# Patient Record
Sex: Female | Born: 1985 | Race: Black or African American | Hispanic: Refuse to answer | Smoking: Former smoker
Health system: Southern US, Community
[De-identification: ages and names within clinical notes are randomized; demographics above are authoritative.]

## PROBLEM LIST (undated history)

## (undated) DIAGNOSIS — G43909 Migraine, unspecified, not intractable, without status migrainosus: Secondary | ICD-10-CM

## (undated) DIAGNOSIS — F17210 Nicotine dependence, cigarettes, uncomplicated: Secondary | ICD-10-CM

## (undated) DIAGNOSIS — I499 Cardiac arrhythmia, unspecified: Secondary | ICD-10-CM

## (undated) DIAGNOSIS — E785 Hyperlipidemia, unspecified: Secondary | ICD-10-CM

## (undated) DIAGNOSIS — Z975 Presence of (intrauterine) contraceptive device: Secondary | ICD-10-CM

## (undated) DIAGNOSIS — F339 Major depressive disorder, recurrent, unspecified: Secondary | ICD-10-CM

## (undated) DIAGNOSIS — J189 Pneumonia, unspecified organism: Secondary | ICD-10-CM

## (undated) HISTORY — DX: Nicotine dependence, cigarettes, uncomplicated: F17.210

## (undated) HISTORY — DX: Migraine, unspecified, not intractable, without status migrainosus: G43.909

## (undated) HISTORY — PX: OTHER SURGICAL HISTORY: SHX169

## (undated) HISTORY — DX: Presence of (intrauterine) contraceptive device: Z97.5

## (undated) HISTORY — PX: WISDOM TOOTH EXTRACTION: SHX21

## (undated) HISTORY — DX: Major depressive disorder, recurrent, unspecified: F33.9

---
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
## 2004-06-11 ENCOUNTER — Emergency Department: Payer: Self-pay | Admitting: Emergency Medicine

## 2005-07-13 ENCOUNTER — Other Ambulatory Visit: Payer: Self-pay

## 2005-07-13 ENCOUNTER — Emergency Department: Payer: Self-pay | Admitting: Unknown Physician Specialty

## 2010-11-03 ENCOUNTER — Inpatient Hospital Stay: Payer: Self-pay | Admitting: Internal Medicine

## 2012-08-16 ENCOUNTER — Other Ambulatory Visit: Payer: Self-pay | Admitting: Specialist

## 2012-08-17 ENCOUNTER — Ambulatory Visit
Admission: RE | Admit: 2012-08-17 | Discharge: 2012-08-17 | Disposition: A | Discharge status: Home / Self Care | Source: Ambulatory Visit | Attending: Specialist | Admitting: Specialist

## 2014-12-21 ENCOUNTER — Ambulatory Visit (INDEPENDENT_AMBULATORY_CARE_PROVIDER_SITE_OTHER): Admitting: Family Medicine

## 2014-12-21 ENCOUNTER — Encounter: Payer: Self-pay | Admitting: Family Medicine

## 2014-12-21 VITALS — BP 116/78 | HR 118 | Temp 99.5°F | Resp 16 | Ht 63.0 in | Wt 211.1 lb

## 2014-12-21 DIAGNOSIS — Z30431 Encounter for routine checking of intrauterine contraceptive device: Secondary | ICD-10-CM | POA: Diagnosis not present

## 2014-12-21 DIAGNOSIS — R5383 Other fatigue: Secondary | ICD-10-CM | POA: Diagnosis not present

## 2014-12-21 DIAGNOSIS — G43009 Migraine without aura, not intractable, without status migrainosus: Secondary | ICD-10-CM

## 2014-12-21 DIAGNOSIS — R600 Localized edema: Secondary | ICD-10-CM | POA: Insufficient documentation

## 2014-12-21 DIAGNOSIS — IMO0001 Reserved for inherently not codable concepts without codable children: Secondary | ICD-10-CM | POA: Insufficient documentation

## 2014-12-21 DIAGNOSIS — E66812 Obesity, class 2: Secondary | ICD-10-CM | POA: Insufficient documentation

## 2014-12-21 DIAGNOSIS — Z720 Tobacco use: Secondary | ICD-10-CM

## 2014-12-21 DIAGNOSIS — Z13220 Encounter for screening for lipoid disorders: Secondary | ICD-10-CM | POA: Diagnosis not present

## 2014-12-21 DIAGNOSIS — G43109 Migraine with aura, not intractable, without status migrainosus: Secondary | ICD-10-CM | POA: Insufficient documentation

## 2014-12-21 DIAGNOSIS — E669 Obesity, unspecified: Secondary | ICD-10-CM

## 2014-12-21 DIAGNOSIS — Z136 Encounter for screening for cardiovascular disorders: Secondary | ICD-10-CM | POA: Diagnosis not present

## 2014-12-21 DIAGNOSIS — F17210 Nicotine dependence, cigarettes, uncomplicated: Secondary | ICD-10-CM | POA: Insufficient documentation

## 2014-12-21 MED ORDER — BUPROPION HCL ER (SR) 150 MG PO TB12: 150.0000 mg | ORAL_TABLET | Freq: Two times a day (BID) | ORAL | Status: DC

## 2014-12-21 MED ORDER — FUROSEMIDE 20 MG PO TABS: 10.0000 mg | ORAL_TABLET | Freq: Every day | ORAL | Status: DC | PRN

## 2014-12-21 NOTE — Patient Instructions (Signed)
Peripheral Edema °You have swelling in your legs (peripheral edema). This swelling is due to excess accumulation of salt and water in your body. Edema may be a sign of heart, kidney or liver disease, or a side effect of a medication. It may also be due to problems in the leg veins. Elevating your legs and using special support stockings may be very helpful, if the cause of the swelling is due to poor venous circulation. Avoid long periods of standing, whatever the cause. °Treatment of edema depends on identifying the cause. Chips, pretzels, pickles and other salty foods should be avoided. Restricting salt in your diet is almost always needed. Water pills (diuretics) are often used to remove the excess salt and water from your body via urine. These medicines prevent the kidney from reabsorbing sodium. This increases urine flow. °Diuretic treatment may also result in lowering of potassium levels in your body. Potassium supplements may be needed if you have to use diuretics daily. Daily weights can help you keep track of your progress in clearing your edema. You should call your caregiver for follow up care as recommended. °SEEK IMMEDIATE MEDICAL CARE IF:  °· You have increased swelling, pain, redness, or heat in your legs. °· You develop shortness of breath, especially when lying down. °· You develop chest or abdominal pain, weakness, or fainting. °· You have a fever. °Document Released: 05/14/2004 Document Revised: 06/29/2011 Document Reviewed: 04/24/2009 °[employer]® Patient Information ©2015 [employer]. This information is not intended to replace advice given to you by your health care provider. Make sure you discuss any questions you have with your health care provider. ° °

## 2014-12-21 NOTE — Progress Notes (Signed)
Name: Olivia King   MRN: [id number]    DOB: [birth year]   Date:12/21/2014       Progress Note  Subjective  Chief Complaint  Chief Complaint  Patient presents with  . Establish Care    pt here to est care    HPI  Olivia King is a 29 year old female with a 30 yo female child and 30 yo female child who is here to establish care for Primary care services. Last PAP test done 11/2013 though Gynecology specialist, normal. She does have Mirena IUD (2nd one placed) which she reports is displaced, strings not identified, ultrasound confirmed reasonable placement. Due to have Mirena out next year. She reports some pressure sensation when on her very light menses without pain or blood clots.  In addition she is interested in smoking cessation. She is currently smoking 1/2 pack to 1 pack a day of cigarettes for many years. Has tried nicoderm gum, losengens, patches with minimal relief. Has not yet tried Wellbutrin. She reports some fatigue and difficulty losing weight due to exercise intolerance. When she was pregnant she was up to 280 lbs. She does not exercise regularly or have a balance diet of low fat, low carbohydrate meals. In addition patient complains of edema which is a ongoing chronic issues for over 1 year now. The location of the edema is lower leg(s) bilateral. The edema has been off and on. Onset of symptoms was several years ago, unchanged since that time. The edema is present in the evening mostly and not every day. The patient states the problem is long-standing. The swelling has been aggravated by dependency of involved area, relieved by elevation of involved area, low-salt diet. Cardiac risk factors include obesity (BMI >= 30 kg/m2). Not associated with chest pain or SOB. Does note continued frustration with not being able to lose weight, fatigue.  Has had migraine headaches in the past. In the past was on topiramate but recently has not needed anything but occasional Excedrine  migraine.   Active Ambulatory Problems    Diagnosis Date Noted  . Cigarette smoker 12/21/2014  . Obesity, Class II, BMI 35-39.9 12/21/2014  . Migraine without aura and without status migrainosus, not intractable 12/21/2014  . Bilateral lower extremity edema 12/21/2014  . Encounter for cholesteral screening for cardiovascular disease 12/21/2014  . Other fatigue 12/21/2014  . Surveillance of (intrauterine) contraceptive device 12/21/2014   Resolved Ambulatory Problems    Diagnosis Date Noted  . No Resolved Ambulatory Problems   No Additional Past Medical History    Social History  Substance Use Topics  . Smoking status: Current Every Day Smoker  . Smokeless tobacco: Not on file  . Alcohol Use: No    Current outpatient prescriptions:  Marland Kitchen  Multiple Vitamin (MULTI-VITAMINS) TABS, Take by mouth., Disp: , Rfl:   Past Surgical History  Procedure Laterality Date  . Contraceptive implant      Family History  Problem Relation Age of Onset  . Hypertension Mother   . Hypertension Father     Allergies  Allergen Reactions  . Penicillins      Review of Systems  CONSTITUTIONAL: No significant weight changes, fever, chills, weakness. Yes fatigue.  HEENT:  - Eyes: No visual changes.  - Ears: No auditory changes. No pain.  - Nose: No sneezing, congestion, runny nose. - Throat: No sore throat. No changes in swallowing. SKIN: No rash or itching.  CARDIOVASCULAR: No chest pain, chest pressure or chest discomfort. No palpitations. Yes edema.  RESPIRATORY: No shortness of breath, cough or sputum.  GASTROINTESTINAL: No anorexia, nausea, vomiting. No changes in bowel habits. No abdominal pain or blood.  GENITOURINARY: No dysuria. No frequency. No discharge.  NEUROLOGICAL: No headache, dizziness, syncope, paralysis, ataxia, numbness or tingling in the extremities. No memory changes. No change in bowel or bladder control.  MUSCULOSKELETAL: No joint pain. No muscle pain. HEMATOLOGIC:  No anemia, bleeding or bruising.  LYMPHATICS: No enlarged lymph nodes.  PSYCHIATRIC: No change in mood. No change in sleep pattern.  ENDOCRINOLOGIC: No reports of sweating, cold or heat intolerance. No polyuria or polydipsia.     Objective  BP 116/78 mmHg  Pulse 118  Temp(Src) 99.5 F (37.5 C)  Resp 16  Ht 5\' 3"  (1.6 m)  Wt 211 lb 2 oz (95.766 kg)  BMI 37.41 kg/m2  SpO2 94% Body mass index is 37.41 kg/(m^2).  Physical Exam  Constitutional: Patient is obese and well-nourished. In no distress.  HEENT:  - Head: Normocephalic and atraumatic.  - Ears: Bilateral TMs gray, no erythema or effusion - Nose: Nasal mucosa moist - Mouth/Throat: Oropharynx is clear and moist. No tonsillar hypertrophy or erythema. No post nasal drainage.  - Eyes: Conjunctivae clear, EOM movements normal. PERRLA. No scleral icterus.  Neck: Normal range of motion. Neck supple. No JVD present. No thyromegaly present.  Cardiovascular: Normal rate, regular rhythm and normal heart sounds.  No murmur heard.  Pulmonary/Chest: Effort normal and breath sounds normal. No respiratory distress. Musculoskeletal: Normal range of motion bilateral UE and LE, no joint effusions. Peripheral vascular: Bilateral LE no edema. Neurological: CN II-XII grossly intact with no focal deficits. Alert and oriented to person, place, and time. Coordination, balance, strength, speech and gait are normal.  Skin: Skin is warm and dry. No rash noted. No erythema.  Psychiatric: Patient has a normal mood and affect. Behavior is normal in office today. Judgment and thought content normal in office today.  Assessment & Plan  1. Cigarette smoker The patient has been counseled on smoking cessation benefits, goals, strategies and available over the counter and prescription medications that may help them in their efforts.  Options discussed include Nicoderm patches, Wellbutrin and Chantix.  The patient voices understanding their increased risk of  cardiovascular and pulmonary diseases with continued use of tobacco products.  - buPROPion (WELLBUTRIN SR) 150 MG 12 hr tablet; Take 1 tablet (150 mg total) by mouth 2 (two) times daily.  Dispense: 60 tablet; Refill: 2  2. Obesity, Class II, BMI 35-39.9 The patient has been counseled on their higher than normal BMI.  They have verbally expressed understanding their increased risk for other diseases.  In efforts to meet a better target BMI goal the patient has been counseled on lifestyle, diet and exercise modification tactics. Start with moderate intensity aerobic exercise (walking, jogging, elliptical, swimming, group or individual sports, hiking) at least 14mins a day at least 4 days a week and increase intensity, duration, frequency as tolerated. Diet should include well balance fresh fruits and vegetables avoiding processed foods, carbohydrates and sugars. Drink at least 8oz 10 glasses a day avoiding sodas, sugary fruit drinks, sweetened tea. Check weight on a reliable scale daily and monitor weight loss progress daily. Consider investing in mobile phone apps that will help keep track of weight loss goals.  - CBC with Differential/Platelet - Comprehensive metabolic panel - Lipid panel - Hemoglobin A1c - TSH - Vit D  25 hydroxy (rtn osteoporosis monitoring)  3. Migraine without aura and without status migrainosus,  not intractable In the past was on topiramate but recently has not needed anything but occasional Excedrine migraine.  - CBC with Differential/Platelet - Comprehensive metabolic panel - TSH - Vit D  25 hydroxy (rtn osteoporosis monitoring)  4. Bilateral lower extremity edema Likely due to venous valve insufficiency. Weight loss recommended.  May use low dose lasix prn. The patient has been counseled on the proper use, side effects and potential interactions of the new medication. Patient encouraged to review the side effects and safety profile pamphlet provided with the  prescription from the pharmacy as well as request counseling from the pharmacy team as needed.    - CBC with Differential/Platelet - Comprehensive metabolic panel - TSH - Vit D  25 hydroxy (rtn osteoporosis monitoring) - furosemide (LASIX) 20 MG tablet; Take 0.5-1 tablets (10-20 mg total) by mouth daily as needed for fluid or edema.  Dispense: 30 tablet; Refill: 3  5. Encounter for cholesteral screening for cardiovascular disease Lab work.  - Lipid panel  6. Other fatigue Multifactorial. Will start with blood work.  Wellbutrin may help with energy if related to mood disorder.  - CBC with Differential/Platelet - Comprehensive metabolic panel - Lipid panel - Hemoglobin A1c - TSH - Vit D  25 hydroxy (rtn osteoporosis monitoring) - buPROPion (WELLBUTRIN SR) 150 MG 12 hr tablet; Take 1 tablet (150 mg total) by mouth 2 (two) times daily.  Dispense: 60 tablet; Refill: 2  7. Surveillance of (intrauterine) contraceptive device Will check pelvic exam at CPE but her pressure sensation is limited to menses time and likely nothing significant as the discomfort is not prolonged.

## 2014-12-22 LAB — CBC WITH DIFFERENTIAL/PLATELET
BASOS: 1 %
Basophils Absolute: 0.1 10*3/uL (ref 0.0–0.2)
EOS (ABSOLUTE): 0.2 10*3/uL (ref 0.0–0.4)
EOS: 2 %
HEMATOCRIT: 37.4 % (ref 34.0–46.6)
Hemoglobin: 12.6 g/dL (ref 11.1–15.9)
Immature Grans (Abs): 0 10*3/uL (ref 0.0–0.1)
Immature Granulocytes: 0 %
LYMPHS ABS: 2.6 10*3/uL (ref 0.7–3.1)
Lymphs: 28 %
MCH: 29.6 pg (ref 26.6–33.0)
MCHC: 33.7 g/dL (ref 31.5–35.7)
MCV: 88 fL (ref 79–97)
MONOS ABS: 0.5 10*3/uL (ref 0.1–0.9)
Monocytes: 6 %
Neutrophils Absolute: 5.8 10*3/uL (ref 1.4–7.0)
Neutrophils: 63 %
PLATELETS: 349 10*3/uL (ref 150–379)
RBC: 4.25 x10E6/uL (ref 3.77–5.28)
RDW: 13 % (ref 12.3–15.4)
WBC: 9.2 10*3/uL (ref 3.4–10.8)

## 2014-12-22 LAB — COMPREHENSIVE METABOLIC PANEL
A/G RATIO: 1.2 (ref 1.1–2.5)
ALK PHOS: 72 IU/L (ref 39–117)
ALT: 12 IU/L (ref 0–32)
AST: 19 IU/L (ref 0–40)
Albumin: 4.1 g/dL (ref 3.5–5.5)
BILIRUBIN TOTAL: 0.7 mg/dL (ref 0.0–1.2)
BUN/Creatinine Ratio: 7 — ABNORMAL LOW (ref 8–20)
BUN: 7 mg/dL (ref 6–20)
CHLORIDE: 101 mmol/L (ref 97–108)
CO2: 23 mmol/L (ref 18–29)
Calcium: 9.9 mg/dL (ref 8.7–10.2)
Creatinine, Ser: 1.06 mg/dL — ABNORMAL HIGH (ref 0.57–1.00)
GFR calc Af Amer: 82 mL/min/{1.73_m2} (ref >59)
GFR calc non Af Amer: 71 mL/min/{1.73_m2} (ref >59)
GLOBULIN, TOTAL: 3.3 g/dL (ref 1.5–4.5)
Glucose: 79 mg/dL (ref 65–99)
POTASSIUM: 5.3 mmol/L — AB (ref 3.5–5.2)
SODIUM: 137 mmol/L (ref 134–144)
Total Protein: 7.4 g/dL (ref 6.0–8.5)

## 2014-12-22 LAB — HEMOGLOBIN A1C
Est. average glucose Bld gHb Est-mCnc: 103 mg/dL
Hgb A1c MFr Bld: 5.2 % (ref 4.8–5.6)

## 2014-12-22 LAB — LIPID PANEL
CHOL/HDL RATIO: 3.7 ratio (ref 0.0–4.4)
CHOLESTEROL TOTAL: 176 mg/dL (ref 100–199)
HDL: 47 mg/dL (ref >39)
LDL Calculated: 113 mg/dL — ABNORMAL HIGH (ref 0–99)
TRIGLYCERIDES: 80 mg/dL (ref 0–149)
VLDL Cholesterol Cal: 16 mg/dL (ref 5–40)

## 2014-12-22 LAB — VITAMIN D 25 HYDROXY (VIT D DEFICIENCY, FRACTURES): Vit D, 25-Hydroxy: 29.9 ng/mL — ABNORMAL LOW (ref 30.0–100.0)

## 2014-12-22 LAB — TSH: TSH: 1.68 u[IU]/mL (ref 0.450–4.500)

## 2014-12-24 ENCOUNTER — Other Ambulatory Visit: Payer: Self-pay | Admitting: Family Medicine

## 2014-12-24 DIAGNOSIS — R7989 Other specified abnormal findings of blood chemistry: Secondary | ICD-10-CM | POA: Insufficient documentation

## 2014-12-24 MED ORDER — VITAMIN D (ERGOCALCIFEROL) 1.25 MG (50000 UNIT) PO CAPS: 50000.0000 [IU] | ORAL_CAPSULE | ORAL | Status: DC

## 2014-12-25 ENCOUNTER — Telehealth: Payer: Self-pay | Admitting: Family Medicine

## 2014-12-25 NOTE — Telephone Encounter (Signed)
Patient returning your call.

## 2014-12-25 NOTE — Telephone Encounter (Signed)
Contacted this patient to review the results from her most recent labs. After she verified her date of birth, labs were reviewed. Patient said thanks for letting her know and that she will pick up the lab slip.

## 2015-03-21 ENCOUNTER — Other Ambulatory Visit: Payer: Self-pay | Admitting: Family Medicine

## 2015-05-13 ENCOUNTER — Ambulatory Visit (INDEPENDENT_AMBULATORY_CARE_PROVIDER_SITE_OTHER): Admitting: Family Medicine

## 2015-05-13 ENCOUNTER — Encounter: Payer: Self-pay | Admitting: Family Medicine

## 2015-05-13 ENCOUNTER — Telehealth: Payer: Self-pay | Admitting: Family Medicine

## 2015-05-13 VITALS — BP 118/82 | HR 103 | Temp 99.7°F | Resp 16 | Wt 218.0 lb

## 2015-05-13 DIAGNOSIS — Z720 Tobacco use: Secondary | ICD-10-CM | POA: Diagnosis not present

## 2015-05-13 DIAGNOSIS — N390 Urinary tract infection, site not specified: Secondary | ICD-10-CM

## 2015-05-13 DIAGNOSIS — R059 Cough, unspecified: Secondary | ICD-10-CM

## 2015-05-13 DIAGNOSIS — Z716 Tobacco abuse counseling: Secondary | ICD-10-CM | POA: Diagnosis not present

## 2015-05-13 DIAGNOSIS — R05 Cough: Secondary | ICD-10-CM

## 2015-05-13 DIAGNOSIS — F17210 Nicotine dependence, cigarettes, uncomplicated: Secondary | ICD-10-CM

## 2015-05-13 DIAGNOSIS — G43009 Migraine without aura, not intractable, without status migrainosus: Secondary | ICD-10-CM

## 2015-05-13 DIAGNOSIS — R1030 Lower abdominal pain, unspecified: Secondary | ICD-10-CM | POA: Insufficient documentation

## 2015-05-13 DIAGNOSIS — R600 Localized edema: Secondary | ICD-10-CM

## 2015-05-13 LAB — POCT URINALYSIS DIPSTICK
BILIRUBIN UA: NEGATIVE
Glucose, UA: NEGATIVE
Ketones, UA: NEGATIVE
Leukocytes, UA: NEGATIVE
NITRITE UA: NEGATIVE
PH UA: 5.5
Protein, UA: NEGATIVE
Spec Grav, UA: 1.025
Urobilinogen, UA: 0.2

## 2015-05-13 MED ORDER — VARENICLINE TARTRATE 1 MG PO TABS: 1.0000 mg | ORAL_TABLET | Freq: Two times a day (BID) | ORAL | Status: DC

## 2015-05-13 MED ORDER — CEPHALEXIN 500 MG PO CAPS: 500.0000 mg | ORAL_CAPSULE | Freq: Two times a day (BID) | ORAL | Status: DC

## 2015-05-13 MED ORDER — FUROSEMIDE 20 MG PO TABS: 10.0000 mg | ORAL_TABLET | Freq: Every day | ORAL | Status: DC | PRN

## 2015-05-13 MED ORDER — VARENICLINE TARTRATE 0.5 MG PO TABS: ORAL_TABLET | ORAL | Status: DC

## 2015-05-13 NOTE — Telephone Encounter (Signed)
Ok changed and faxed to [employer].  Patient notified.

## 2015-05-13 NOTE — Progress Notes (Signed)
Name: Olivia King   MRN: [id number]    DOB: Aug 12, 1985   Date:05/13/2015       Progress Note  Subjective  Chief Complaint  Chief Complaint  Patient presents with  . Urinary Tract Infection    lower abdominal pain with burning and urine has strong odor.  . Nicotine Dependence    patient states welbutrin was not helping her quit smoking wants to see about trying something.    HPI  Olivia King is a 30 year old female who is interested in quitting smoking cigarettes. She is currently an every day smoker up to 1ppd. Previously she was prescribed Wellbutrin SR 150 mg po bid for 2 months but it did not help her with her smoking cessation goals. Would like to try alternative if there is other options. Associated symptoms include recent coughing for 2 weeks with sputum production but no fevers, chills, sob.   Patient is here today with concerns regarding the following symptoms abnormal smelling urine, burning with urination, dysuria, frequency and suprapubic pressure that started 5 days ago.  Associated with fatigue. Not associated with flank pain, fevers, pelvic pain, vaginal discharge.    Past Medical History  Diagnosis Date  . Migraine headache   . Cigarette smoker     Social History  Substance Use Topics  . Smoking status: Current Every Day Smoker  . Smokeless tobacco: Not on file  . Alcohol Use: No     Current outpatient prescriptions:  .  buPROPion (WELLBUTRIN SR) 150 MG 12 hr tablet, Take 1 tablet (150 mg total) by mouth 2 (two) times daily., Disp: 60 tablet, Rfl: 2 .  furosemide (LASIX) 20 MG tablet, Take 0.5-1 tablets (10-20 mg total) by mouth daily as needed for fluid or edema., Disp: 30 tablet, Rfl: 3 .  Multiple Vitamin (MULTI-VITAMINS) TABS, Take by mouth., Disp: , Rfl:   Allergies  Allergen Reactions  . Penicillins     ROS  CONSTITUTIONAL: No significant weight changes, fever, chills, weakness or fatigue.  CARDIOVASCULAR: No chest pain, chest pressure or  chest discomfort. No palpitations or edema.  RESPIRATORY: No shortness of breath, cough or sputum.  GASTROINTESTINAL: No anorexia, nausea, vomiting. No changes in bowel habits. No abdominal pain or blood.  GENITOURINARY: Yes dysuria. Yes frequency. No discharge.  NEUROLOGICAL: No headache, dizziness, syncope, paralysis, ataxia, numbness or tingling in the extremities. No memory changes. No change in bowel or bladder control.  MUSCULOSKELETAL: No joint pain. No muscle pain. PSYCHIATRIC: No change in mood. No change in sleep pattern.  ENDOCRINOLOGIC: No reports of sweating, cold or heat intolerance. No polyuria or polydipsia.      Objective  Filed Vitals:   05/13/15 1343  BP: 118/82  Pulse: 103  Temp: 99.7 F (37.6 C)  TempSrc: Oral  Resp: 16  Weight: 218 lb (98.884 kg)  SpO2: 98%   Body mass index is 38.63 kg/(m^2).   Physical Exam  Constitutional: Patient appears well-developed and well-nourished. In no acute distress but does appear to be uncomfortable from acute illness. Cardiovascular: Normal rate, regular rhythm and normal heart sounds.  No murmur heard.  Pulmonary/Chest: Effort normal and breath sounds reduced tidal volume and scattered wheezing. No respiratory distress. Abdomen: Soft with normal bowel sounds, mild tenderness on deep palpation over suprapubic area, no reproducible flank tenderness bilaterally.  Genitourinary: Exam deferred. Skin: Skin is warm and dry. No rash noted. No erythema.  Psychiatric: Patient has a normal mood and affect. Behavior is normal in office today. Judgment and  thought content normal in office today.  Results for orders placed or performed in visit on 05/13/15 (from the past 24 hour(s))  POCT Urinalysis Dipstick     Status: Abnormal   Collection Time: 05/13/15  1:46 PM  Result Value Ref Range   Color, UA Yellow    Clarity, UA clear    Glucose, UA neg    Bilirubin, UA neg    Ketones, UA neg    Spec Grav, UA 1.025    Blood, UA large     pH, UA 5.5    Protein, UA neg    Urobilinogen, UA 0.2    Nitrite, UA neg    Leukocytes, UA Negative Negative     Assessment & Plan  1. UTI (lower urinary tract infection) Symptoms suggestive of uncomplicated urinary tract infection. Instructed patient on increasing hydration with water and ways to prevent future UTIs. May use Azo for symptomatic relief if not already doing so but not recommended to be used beyond 2-3 days. May start antibiotic therapy.   The patient has been counseled on the proper use, side effects and potential interactions of the new medication (cross reactivity with PCN allergy possible). Patient encouraged to review the side effects and safety profile pamphlet provided with the prescription from the pharmacy as well as request counseling from the pharmacy team as needed.   - POCT Urinalysis Dipstick - Urine Culture - cephALEXin (KEFLEX) 500 MG capsule; Take 1 capsule (500 mg total) by mouth 2 (two) times daily.  Dispense: 20 capsule; Refill: 0  2. Cough Likely due to smoking combined with URI.  - cephALEXin (KEFLEX) 500 MG capsule; Take 1 capsule (500 mg total) by mouth 2 (two) times daily.  Dispense: 20 capsule; Refill: 0  3. Cigarette smoker The patient has been counseled on smoking cessation benefits, goals, strategies and available over the counter and prescription medications that may help them in their efforts.  Options discussed include Nicoderm patches, Wellbutrin and Chantix.  The patient voices understanding their increased risk of cardiovascular and pulmonary diseases with continued use of tobacco products.  - varenicline (CHANTIX) 0.5 MG tablet; Start with 0.5 mg PO daily for 3 days, then 0.5mg  po bid for 4 days  Dispense: 11 tablet; Refill: 0 - varenicline (CHANTIX CONTINUING MONTH PAK) 1 MG tablet; Take 1 tablet (1 mg total) by mouth 2 (two) times daily.  Dispense: 60 tablet; Refill: 1  4. Encounter for smoking cessation counseling  -  varenicline (CHANTIX) 0.5 MG tablet; Start with 0.5 mg PO daily for 3 days, then 0.5mg  po bid for 4 days  Dispense: 11 tablet; Refill: 0 - varenicline (CHANTIX CONTINUING MONTH PAK) 1 MG tablet; Take 1 tablet (1 mg total) by mouth 2 (two) times daily.  Dispense: 60 tablet; Refill: 1

## 2015-05-13 NOTE — Telephone Encounter (Signed)
Patient was seen today and received medication refills that were sent to [facility], but she just found out that they don't accept her insurance.  Therefore she would like for the RXs to be sent to the [facility] on [address].  And patient stated that she also needs the furosemide 20mg  as well.  Please contact patient once request is complete.

## 2015-05-16 LAB — URINE CULTURE

## 2015-05-16 LAB — PLEASE NOTE

## 2015-06-25 ENCOUNTER — Other Ambulatory Visit: Payer: Self-pay | Admitting: Family Medicine

## 2015-06-25 ENCOUNTER — Encounter: Payer: Self-pay | Admitting: Family Medicine

## 2015-06-25 ENCOUNTER — Ambulatory Visit (INDEPENDENT_AMBULATORY_CARE_PROVIDER_SITE_OTHER): Admitting: Family Medicine

## 2015-06-25 VITALS — BP 118/78 | HR 105 | Temp 99.2°F | Resp 16 | Wt 218.0 lb

## 2015-06-25 DIAGNOSIS — R05 Cough: Secondary | ICD-10-CM | POA: Diagnosis not present

## 2015-06-25 DIAGNOSIS — R059 Cough, unspecified: Secondary | ICD-10-CM

## 2015-06-25 DIAGNOSIS — Z30431 Encounter for routine checking of intrauterine contraceptive device: Secondary | ICD-10-CM

## 2015-06-25 DIAGNOSIS — R21 Rash and other nonspecific skin eruption: Secondary | ICD-10-CM

## 2015-06-25 DIAGNOSIS — R102 Pelvic and perineal pain: Secondary | ICD-10-CM

## 2015-06-25 DIAGNOSIS — Z23 Encounter for immunization: Secondary | ICD-10-CM | POA: Diagnosis not present

## 2015-06-25 DIAGNOSIS — N949 Unspecified condition associated with female genital organs and menstrual cycle: Secondary | ICD-10-CM

## 2015-06-25 DIAGNOSIS — F330 Major depressive disorder, recurrent, mild: Secondary | ICD-10-CM

## 2015-06-25 LAB — POCT URINALYSIS DIPSTICK
Bilirubin, UA: NEGATIVE
GLUCOSE UA: NEGATIVE
KETONES UA: NEGATIVE
Leukocytes, UA: NEGATIVE
Nitrite, UA: NEGATIVE
Urobilinogen, UA: 0.2
pH, UA: 6

## 2015-06-25 MED ORDER — FLUTICASONE FUROATE-VILANTEROL 100-25 MCG/INH IN AEPB: 1.0000 | INHALATION_SPRAY | Freq: Every day | RESPIRATORY_TRACT | Status: DC

## 2015-06-25 MED ORDER — DULOXETINE HCL 30 MG PO CPEP: 30.0000 mg | ORAL_CAPSULE | Freq: Every day | ORAL | Status: DC

## 2015-06-25 NOTE — Progress Notes (Signed)
Name: Olivia King   MRN: [id number]    DOB: Sep 27, 1985   Date:06/25/2015       Progress Note  Subjective  Chief Complaint  Chief Complaint  Patient presents with  . Abdominal Cramping  . URI    patient presents with productive cough for over a month. OTC: nyquil, dayquil, robitussin, alka seltzer.   . Fatigue  . Rash    patient stated that she wonders if it is MRSA. she has a red rash left forearm and left groin area. 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    HPI  Abdominal cramping: she had an IUD placed in Aug 16, 2011, she could feel strings but over the past few years she has been unable to feel the strings. She had  Pelvic US about two years ago at [place] and IUD was in place, however over the months she has intermittent pelvic pain, not associated with days of spotting. At times the pain is very intense and sharp and can wake her up from her sleep. She denies vaginal discharge, last intercourse was a few months ago and she used condoms. She has been with same sexual partner for the past year  URI: she states that she had a cold about one month ago, but still has a productive cough and a mild sore throat, but no wheezing or SOB. She smokes still. Explained importance of quitting.   Major Depression: she has noticed fatigue, weight gain, anhedonia, feels overwhelmed easily, she  just had TSH done less than 6 months ago, asked if she is depressed, ( she has a history of major depression when her husband had deployed for the first time ). He died in 15-Aug-2009 during one of his deployments. She also lost her mother  in 08-15-2012 ( pancreatic cancer )  and sister in [id number] ( from complications of CHF ). She is still grieving all that loss. She went to grieving counseling but did not like it. She has tried Zoloft and Paxil - and she state she did not like Zoloft but Paxil seemed to work, she has not taken any medications for many years.    Rash: she is a massage therapist and had a new client last week and since than she has noticed recurrent pustules in different parts of her body. It is red and tender, but small in size. She has been applying neosporin.   Patient Active Problem List   Diagnosis Date Noted  . Encounter for smoking cessation counseling 05/13/2015  . Low serum vitamin D 12/24/2014  . Cigarette smoker 12/21/2014  . Obesity, Class II, BMI 35-39.9 12/21/2014  . Migraine without aura and without status migrainosus, not intractable 12/21/2014  . Bilateral lower extremity edema 12/21/2014  . Other fatigue 12/21/2014  . Surveillance of (intrauterine) contraceptive device 12/21/2014    Past Surgical History  Procedure Laterality Date  . Contraceptive implant      Family History  Problem Relation Age of Onset  . Hypertension Mother   . Hypertension Father     Social History   Social History  . Marital Status: Widowed    Spouse Name: N/A  . Number of Children: N/A  . Years of Education: N/A   Occupational History  . Not on file.   Social History Main Topics  . Smoking status: Current Every Day Smoker  . Smokeless tobacco: Not on file  . Alcohol Use: No  .  Drug Use: No  . Sexual Activity: No   Other Topics Concern  . Not on file   Social History Narrative     Current outpatient prescriptions:  .  furosemide (LASIX) 20 MG tablet, Take 0.5-1 tablets (10-20 mg total) by mouth daily as needed for fluid or edema., Disp: 30 tablet, Rfl: 3 .  Multiple Vitamin (MULTI-VITAMINS) TABS, Take by mouth., Disp: , Rfl:   Allergies  Allergen Reactions  . Penicillins Rash     ROS  Constitutional: Negative for fever or weight change.  Respiratory: Positive for cough, but no  shortness of breath.   Cardiovascular: Negative for chest pain or palpitations.  Gastrointestinal: Negative for abdominal pain, no bowel changes.  Musculoskeletal: Negative for gait problem or joint swelling.  Skin:  Negative for rash.  Neurological: Negative for dizziness , positive for  headache.  No other specific complaints in a complete review of systems (except as listed in HPI above).  Objective  Filed Vitals:   06/25/15 1010  BP: 118/78  Pulse: 105  Temp: 99.2 F (37.3 C)  TempSrc: Oral  Resp: 16  Weight: 218 lb (98.884 kg)  SpO2: 98%    Body mass index is 38.63 kg/(m^2).  Physical Exam  Constitutional: Patient appears well-developed and well-nourished. Obese No distress.  HEENT: head atraumatic, normocephalic, pupils equal and reactive to light, ears TM normal bilaterally,  neck supple, throat within normal limits Cardiovascular: Normal rate, regular rhythm and normal King sounds.  No murmur heard. No BLE edema. Pulmonary/Chest: Effort normal and breath sounds normal. No respiratory distress. Abdominal: Soft.  There is no tenderness. Psychiatric: Patient has a normal mood and affect. behavior is normal. Judgment and thought content normal. GYN: pain during bimanual exam, cervix looks normal, IUD string not seen.   Recent Results (from the past 2160 hour(s))  POCT Urinalysis Dipstick     Status: Abnormal   Collection Time: 05/13/15  1:46 PM  Result Value Ref Range   Color, UA Yellow    Clarity, UA clear    Glucose, UA neg    Bilirubin, UA neg    Ketones, UA neg    Spec Grav, UA 1.025    Blood, UA large    pH, UA 5.5    Protein, UA neg    Urobilinogen, UA 0.2    Nitrite, UA neg    Leukocytes, UA Negative Negative  Urine Culture     Status: None   Collection Time: 05/14/15 12:00 AM  Result Value Ref Range   Urine Culture, Routine Final report    Urine Culture result 1 Comment     Comment: Culture shows less than 10,000 colony forming units of bacteria per milliliter of urine. This colony count is not generally considered to be clinically significant.   Please Note     Status: None   Collection Time: 05/14/15 12:00 AM  Result Value Ref Range   Please note Comment      Comment: The date and/or time of collection was not indicated on the requisition as required by state and federal law.  The date of receipt of the specimen was used as the collection date if not supplied.   POCT urinalysis dipstick     Status: Normal   Collection Time: 06/25/15 10:15 AM  Result Value Ref Range   Color, UA yellow    Clarity, UA clear    Glucose, UA negative    Bilirubin, UA negative    Ketones, UA negative  Spec Grav, UA >=1.030    Blood, UA small    pH, UA 6.0    Protein, UA trace    Urobilinogen, UA 0.2    Nitrite, UA negative    Leukocytes, UA Negative Negative     PHQ2/9: Depression screen Olivia King 2/9 06/25/2015 05/13/2015  Decreased Interest 0 0  Down, Depressed, Hopeless 0 0  PHQ - 2 Score 0 0     Fall Risk: Fall Risk  06/25/2015 05/13/2015  Falls in the past year? No No    Functional Status Survey: Is the patient deaf or have difficulty hearing?: No Does the patient have difficulty seeing, even when wearing glasses/contacts?: No Does the patient have difficulty concentrating, remembering, or making decisions?: No Does the patient have difficulty walking or climbing stairs?: No Does the patient have difficulty dressing or bathing?: No Does the patient have difficulty doing errands alone such as visiting a doctor's office or shopping?: No    Assessment & Plan  1. Pelvic cramping  - POCT urinalysis dipstick -Refer GYN, pain during exam, IUD strings not seen, take ibuprofen prn otc for pain   2. Surveillance of (intrauterine) contraceptive device  -refer GYN  3. Need for Tdap vaccination  - Tdap vaccine greater than or equal to 7yo IM  4. Needs flu shot  - Flu Vaccine QUAD 36+ mos IM -refused  5. Rash  - Nasal culture ( MRSA ) 6. Cough  - fluticasone furoate-vilanterol (BREO ELLIPTA) 100-25 MCG/INH AEPB; Inhale 1 puff into the lungs daily.  Dispense: 60 each; Refill: 0  7. Mild episode of recurrent major depressive disorder  (HCC)  - DULoxetine (CYMBALTA) 30 MG capsule; Take 1-2 capsules (30-60 mg total) by mouth daily. First week one capsule after that two daily  Dispense: 60 capsule; Refill: 0

## 2015-06-29 ENCOUNTER — Other Ambulatory Visit: Payer: Self-pay | Admitting: Family Medicine

## 2015-06-29 LAB — UPPER RESPIRATORY CULTURE, ROUTINE

## 2015-06-29 LAB — TEST CODE CHANGE

## 2015-06-29 LAB — PLEASE NOTE

## 2015-06-29 MED ORDER — SULFAMETHOXAZOLE-TRIMETHOPRIM 800-160 MG PO TABS: 1.0000 | ORAL_TABLET | Freq: Two times a day (BID) | ORAL | Status: DC

## 2015-07-23 ENCOUNTER — Ambulatory Visit (INDEPENDENT_AMBULATORY_CARE_PROVIDER_SITE_OTHER): Admitting: Family Medicine

## 2015-07-23 ENCOUNTER — Encounter: Payer: Self-pay | Admitting: Family Medicine

## 2015-07-23 VITALS — BP 104/72 | HR 108 | Temp 99.4°F | Resp 14 | Wt 212.0 lb

## 2015-07-23 DIAGNOSIS — Z720 Tobacco use: Secondary | ICD-10-CM

## 2015-07-23 DIAGNOSIS — N289 Disorder of kidney and ureter, unspecified: Secondary | ICD-10-CM | POA: Diagnosis not present

## 2015-07-23 DIAGNOSIS — F17210 Nicotine dependence, cigarettes, uncomplicated: Secondary | ICD-10-CM

## 2015-07-23 DIAGNOSIS — D2339 Other benign neoplasm of skin of other parts of face: Secondary | ICD-10-CM

## 2015-07-23 DIAGNOSIS — E875 Hyperkalemia: Secondary | ICD-10-CM | POA: Diagnosis not present

## 2015-07-23 DIAGNOSIS — R5383 Other fatigue: Secondary | ICD-10-CM | POA: Diagnosis not present

## 2015-07-23 DIAGNOSIS — R7989 Other specified abnormal findings of blood chemistry: Secondary | ICD-10-CM | POA: Diagnosis not present

## 2015-07-23 NOTE — Progress Notes (Signed)
BP 104/72 mmHg  Pulse 108  Temp(Src) 99.4 F (37.4 C) (Oral)  Resp 14  Wt 212 lb (96.163 kg)  SpO2 98%  LMP 06/22/2015 (Approximate)   Subjective:    Patient ID: Olivia King, female    DOB: 04/10/1986, 30 y.o.   MRN: [id number]  HPI: Olivia King is a 30 y.o. female  Chief Complaint  Patient presents with  . Medication Refill  . Depression    was started on cymbalta, but stopped due to fatigue.  Patienjt states since having IUD removed alot of her problems has went away   She has a mole that came up 2-3 years ago on the left side of the nose; does not bleed or itch; not burning; not a lot of moles; she had started tanning but stopped  She took the cymbalta and it made her really tired; kept having to yawn and yawn; just trouble getting up and going; slept off and on all day; only took for 4-5 days; mirena was removed and she feels so much better; acne has actually improved; fluid in legs; she does not think she needs antidepressant any more   Smoking; tried wellbutrin; chantix wasn't covered; tried nicotine patches and gum; first cig is 10 minutes after getting up, last cig is last thing at night; social situations; does not smoke with children in the car  Vitamin D deficiency; took Rx and then supplement  Relevant past medical, surgical, family and social history reviewed and updated as indicated Past Medical History  Diagnosis Date  . Migraine headache   . Cigarette smoker    Past Surgical History  Procedure Laterality Date  . Contraceptive implant    (removed)  Family History  Problem Relation Age of Onset  . Hypertension Mother   . Hypertension Father    Allergies and medications reviewed and updated.  Review of Systems Per HPI unless specifically indicated above     Objective:    BP 104/72 mmHg  Pulse 108  Temp(Src) 99.4 F (37.4 C) (Oral)  Resp 14  Wt 212 lb (96.163 kg)  SpO2 98%  LMP 06/22/2015 (Approximate)  Wt Readings from Last 3  Encounters:  07/23/15 212 lb (96.163 kg)  06/25/15 218 lb (98.884 kg)  05/13/15 218 lb (98.884 kg)    Physical Exam  Constitutional: She appears well-developed and well-nourished.  Cardiovascular: Normal rate and regular rhythm.   Heart rate in the 80's during auscultation  Pulmonary/Chest: Effort normal and breath sounds normal.  Skin:  Smooth, uniform brown papule on the side of the nose; no feathering, distinct border, about 2.5 mm diameter  Psychiatric: She has a normal mood and affect. Her mood appears not anxious. She does not exhibit a depressed mood.      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    Appears benign; advised pt to keep an eye on this; if it grows or changes, let me know and we'll refer for excisional biopsy; avoid tanning        Genitourinary   Renal impairment    Labs reviewed from Sept 2016; GFR 82, Cr 1.06; will recheck        Other   Cigarette smoker    See AVS; encouraged cessation      Other fatigue    Improved with removal of IUD      Low serum vitamin  D    Patient has been taking supplementation      Hyperkalemia    Last K+ reviewed; will recheck      Relevant Orders   Basic metabolic panel (Completed)      Follow up plan: Return if symptoms worsen or fail to improve.  Medications Discontinued During This Encounter  Medication Reason  . sulfamethoxazole-trimethoprim (BACTRIM DS,SEPTRA DS) 800-160 MG tablet Error  . furosemide (LASIX) 20 MG tablet Error  . fluticasone furoate-vilanterol (BREO ELLIPTA) 100-25 MCG/INH AEPB Error  . DULoxetine (CYMBALTA) 30 MG capsule Error   An after-visit summary was printed and given to the patient at [facility].  Please see the patient instructions which may contain other information and recommendations beyond what is mentioned above in the assessment and plan.  Orders Placed This Encounter    Procedures  . Basic metabolic panel

## 2015-07-23 NOTE — Assessment & Plan Note (Addendum)
See AVS; encouraged cessation

## 2015-07-23 NOTE — Patient Instructions (Addendum)
Keep an eye on the mole on the nose, and let me know if it gets bigger or becomes symptomatic at all   Smoking Cessation, Tips for Success If you are ready to quit smoking, congratulations! You have chosen to help yourself be healthier. Cigarettes bring nicotine, tar, carbon monoxide, and other irritants into your body. Your lungs, heart, and blood vessels will be able to work better without these poisons. There are many different ways to quit smoking. Nicotine gum, nicotine patches, a nicotine inhaler, or nicotine nasal spray can help with physical craving. Hypnosis, support groups, and medicines help break the habit of smoking. WHAT THINGS CAN I DO TO MAKE QUITTING EASIER?  Here are some tips to help you quit for good:  Pick a date when you will quit smoking completely. Tell all of your friends and family about your plan to quit on that date.  Do not try to slowly cut down on the number of cigarettes you are smoking. Pick a quit date and quit smoking completely starting on that day.  Throw away all cigarettes.   Clean and remove all ashtrays from your home, work, and car.  On a card, write down your reasons for quitting. Carry the card with you and read it when you get the urge to smoke.  Cleanse your body of nicotine. Drink enough water and fluids to keep your urine clear or pale yellow. Do this after quitting to flush the nicotine from your body.  Learn to predict your moods. Do not let a bad situation be your excuse to have a cigarette. Some situations in your life might tempt you into wanting a cigarette.  Never have "just one" cigarette. It leads to wanting another and another. Remind yourself of your decision to quit.  Change habits associated with smoking. If you smoked while driving or when feeling stressed, try other activities to replace smoking. Stand up when drinking your coffee. Brush your teeth after eating. Sit in a different chair when you read the paper. Avoid alcohol  while trying to quit, and try to drink fewer caffeinated beverages. Alcohol and caffeine may urge you to smoke.  Avoid foods and drinks that can trigger a desire to smoke, such as sugary or spicy foods and alcohol.  Ask people who smoke not to smoke around you.  Have something planned to do right after eating or having a cup of coffee. For example, plan to take a walk or exercise.  Try a relaxation exercise to calm you down and decrease your stress. Remember, you may be tense and nervous for the first 2 weeks after you quit, but this will pass.  Find new activities to keep your hands busy. Play with a pen, coin, or rubber band. Doodle or draw things on paper.  Brush your teeth right after eating. This will help cut down on the craving for the taste of tobacco after meals. You can also try mouthwash.   Use oral substitutes in place of cigarettes. Try using lemon drops, carrots, cinnamon sticks, or chewing gum. Keep them handy so they are available when you have the urge to smoke.  When you have the urge to smoke, try deep breathing.  Designate your home as a nonsmoking area.  If you are a heavy smoker, ask your health care provider about a prescription for nicotine chewing gum. It can ease your withdrawal from nicotine.  Reward yourself. Set aside the cigarette money you save and buy yourself something nice.  Look for  support from others. Join a support group or smoking cessation program. Ask someone at home or at work to help you with your plan to quit smoking.  Always ask yourself, "Do I need this cigarette or is this just a reflex?" Tell yourself, "Today, I choose not to smoke," or "I do not want to smoke." You are reminding yourself of your decision to quit.  Do not replace cigarette smoking with electronic cigarettes (commonly called e-cigarettes). The safety of e-cigarettes is unknown, and some may contain harmful chemicals.  If you relapse, do not give up! Plan ahead and think  about what you will do the next time you get the urge to smoke. HOW WILL I FEEL WHEN I QUIT SMOKING? You may have symptoms of withdrawal because your body is used to nicotine (the addictive substance in cigarettes). You may crave cigarettes, be irritable, feel very hungry, cough often, get headaches, or have difficulty concentrating. The withdrawal symptoms are only temporary. They are strongest when you first quit but will go away within 10-14 days. When withdrawal symptoms occur, stay in control. Think about your reasons for quitting. Remind yourself that these are signs that your body is healing and getting used to being without cigarettes. Remember that withdrawal symptoms are easier to treat than the major diseases that smoking can cause.  Even after the withdrawal is over, expect periodic urges to smoke. However, these cravings are generally short lived and will go away whether you smoke or not. Do not smoke! WHAT RESOURCES ARE AVAILABLE TO HELP ME QUIT SMOKING? Your health care provider can direct you to community resources or hospitals for support, which may include:  Group support.  Education.  Hypnosis.  Therapy.   This information is not intended to replace advice given to you by your health care provider. Make sure you discuss any questions you have with your health care provider.   Document Released: 01/03/2004 Document Revised: 04/27/2014 Document Reviewed: 09/22/2012 Elsevier Interactive Patient Education 2016 [employer]. Steps to Quit Smoking  Smoking tobacco can be harmful to your health and can affect almost every organ in your body. Smoking puts you, and those around you, at risk for developing many serious chronic diseases. Quitting smoking is difficult, but it is one of the best things that you can do for your health. It is never too late to quit. WHAT ARE THE BENEFITS OF QUITTING SMOKING? When you quit smoking, you lower your risk of developing serious diseases and  conditions, such as:  Lung cancer or lung disease, such as COPD.  Heart disease.  Stroke.  Heart attack.  Infertility.  Osteoporosis and bone fractures. Additionally, symptoms such as coughing, wheezing, and shortness of breath may get better when you quit. You may also find that you get sick less often because your body is stronger at fighting off colds and infections. If you are pregnant, quitting smoking can help to reduce your chances of having a baby of low birth weight. HOW DO I GET READY TO QUIT? When you decide to quit smoking, create a plan to make sure that you are successful. Before you quit:  Pick a date to quit. Set a date within the next two weeks to give you time to prepare.  Write down the reasons why you are quitting. Keep this list in places where you will see it often, such as on your bathroom mirror or in your car or wallet.  Identify the people, places, things, and activities that make you want  to smoke (triggers) and avoid them. Make sure to take these actions:  Throw away all cigarettes at home, at work, and in your car.  Throw away smoking accessories, such as Scientist, research (medical).  Clean your car and make sure to empty the ashtray.  Clean your home, including curtains and carpets.  Tell your family, friends, and coworkers that you are quitting. Support from your loved ones can make quitting easier.  Talk with your health care provider about your options for quitting smoking.  Find out what treatment options are covered by your health insurance. WHAT STRATEGIES CAN I USE TO QUIT SMOKING?  Talk with your healthcare provider about different strategies to quit smoking. Some strategies include:  Quitting smoking altogether instead of gradually lessening how much you smoke over a period of time. Research shows that quitting "cold [place]" is more successful than gradually quitting.  Attending in-person counseling to help you build problem-solving skills.  You are more likely to have success in quitting if you attend several counseling sessions. Even short sessions of 10 minutes can be effective.  Finding resources and support systems that can help you to quit smoking and remain smoke-free after you quit. These resources are most helpful when you use them often. They can include:  Online chats with a Social worker.  Telephone quitlines.  Printed Furniture conservator/restorer.  Support groups or group counseling.  Text messaging programs.  Mobile phone applications.  Taking medicines to help you quit smoking. (If you are pregnant or breastfeeding, talk with your health care provider first.) Some medicines contain nicotine and some do not. Both types of medicines help with cravings, but the medicines that include nicotine help to relieve withdrawal symptoms. Your health care provider may recommend:  Nicotine patches, gum, or lozenges.  Nicotine inhalers or sprays.  Non-nicotine medicine that is taken by mouth. Talk with your health care provider about combining strategies, such as taking medicines while you are also receiving in-person counseling. Using these two strategies together makes you more likely to succeed in quitting than if you used either strategy on its own. If you are pregnant or breastfeeding, talk with your health care provider about finding counseling or other support strategies to quit smoking. Do not take medicine to help you quit smoking unless told to do so by your health care provider. WHAT THINGS CAN I DO TO MAKE IT EASIER TO QUIT? Quitting smoking might feel overwhelming at first, but there is a lot that you can do to make it easier. Take these important actions:  Reach out to your family and friends and ask that they support and encourage you during this time. Call telephone quitlines, reach out to support groups, or work with a counselor for support.  Ask people who smoke to avoid smoking around you.  Avoid places that trigger  you to smoke, such as bars, parties, or smoke-break areas at work.  Spend time around people who do not smoke.  Lessen stress in your life, because stress can be a smoking trigger for some people. To lessen stress, try:  Exercising regularly.  Deep-breathing exercises.  Yoga.  Meditating.  Performing a body scan. This involves closing your eyes, scanning your body from head to toe, and noticing which parts of your body are particularly tense. Purposefully relax the muscles in those areas.  Download or purchase mobile phone or tablet apps (applications) that can help you stick to your quit plan by providing reminders, tips, and encouragement. There are many free apps,  such as QuitGuide from the [employer] Office manager for Disease Control and Prevention). You can find other support for quitting smoking (smoking cessation) through smokefree.gov and other websites. HOW WILL I FEEL WHEN I QUIT SMOKING? Within the first 24 hours of quitting smoking, you may start to feel some withdrawal symptoms. These symptoms are usually most noticeable 2-3 days after quitting, but they usually do not last beyond 2-3 weeks. Changes or symptoms that you might experience include:  Mood swings.  Restlessness, anxiety, or irritation.  Difficulty concentrating.  Dizziness.  Strong cravings for sugary foods in addition to nicotine.  Mild weight gain.  Constipation.  Nausea.  Coughing or a sore throat.  Changes in how your medicines work in your body.  A depressed mood.  Difficulty sleeping (insomnia). After the first 2-3 weeks of quitting, you may start to notice more positive results, such as:  Improved sense of smell and taste.  Decreased coughing and sore throat.  Slower heart rate.  Lower blood pressure.  Clearer skin.  The ability to breathe more easily.  Fewer sick days. Quitting smoking is very challenging for most people. Do not get discouraged if you are not successful the first time.  Some people need to make many attempts to quit before they achieve long-term success. Do your best to stick to your quit plan, and talk with your health care provider if you have any questions or concerns.   This information is not intended to replace advice given to you by your health care provider. Make sure you discuss any questions you have with your health care provider.   Document Released: 03/31/2001 Document Revised: 08/21/2014 Document Reviewed: 08/21/2014 Elsevier Interactive Patient Education [employer].

## 2015-07-24 ENCOUNTER — Telehealth: Payer: Self-pay | Admitting: Family Medicine

## 2015-07-24 LAB — BASIC METABOLIC PANEL
BUN/Creatinine Ratio: 11 (ref 9–23)
BUN: 10 mg/dL (ref 6–20)
CALCIUM: 10 mg/dL (ref 8.7–10.2)
CHLORIDE: 102 mmol/L (ref 96–106)
CO2: 23 mmol/L (ref 18–29)
Creatinine, Ser: 0.88 mg/dL (ref 0.57–1.00)
GFR calc Af Amer: 102 mL/min/{1.73_m2} (ref >59)
GFR, EST NON AFRICAN AMERICAN: 88 mL/min/{1.73_m2} (ref >59)
GLUCOSE: 89 mg/dL (ref 65–99)
POTASSIUM: 4.8 mmol/L (ref 3.5–5.2)
SODIUM: 139 mmol/L (ref 134–144)

## 2015-07-24 NOTE — Telephone Encounter (Signed)
I left detailed msg; labs are normal; kidney function improved, K+ back to normal; all good news; call if anything I can do

## 2015-07-28 DIAGNOSIS — D2339 Other benign neoplasm of skin of other parts of face: Secondary | ICD-10-CM | POA: Insufficient documentation

## 2015-07-28 DIAGNOSIS — N289 Disorder of kidney and ureter, unspecified: Secondary | ICD-10-CM | POA: Insufficient documentation

## 2015-07-28 NOTE — Assessment & Plan Note (Signed)
Patient has been taking supplementation

## 2015-07-28 NOTE — Assessment & Plan Note (Signed)
Appears benign; advised pt to keep an eye on this; if it grows or changes, let me know and we'll refer for excisional biopsy; avoid tanning

## 2015-07-28 NOTE — Assessment & Plan Note (Signed)
Labs reviewed from Sept 2016; GFR 82, Cr 1.06; will recheck

## 2015-07-28 NOTE — Assessment & Plan Note (Signed)
Last K+ reviewed; will recheck

## 2015-07-28 NOTE — Assessment & Plan Note (Signed)
Improved with removal of IUD

## 2015-08-19 ENCOUNTER — Encounter: Payer: Self-pay | Admitting: Family Medicine

## 2015-08-19 ENCOUNTER — Ambulatory Visit (INDEPENDENT_AMBULATORY_CARE_PROVIDER_SITE_OTHER): Admitting: Family Medicine

## 2015-08-19 VITALS — BP 122/64 | HR 87 | Temp 99.2°F | Resp 14 | Ht 63.0 in | Wt 212.0 lb

## 2015-08-19 DIAGNOSIS — Z720 Tobacco use: Secondary | ICD-10-CM | POA: Diagnosis not present

## 2015-08-19 DIAGNOSIS — F330 Major depressive disorder, recurrent, mild: Secondary | ICD-10-CM | POA: Diagnosis not present

## 2015-08-19 DIAGNOSIS — F3341 Major depressive disorder, recurrent, in partial remission: Secondary | ICD-10-CM | POA: Insufficient documentation

## 2015-08-19 DIAGNOSIS — R350 Frequency of micturition: Secondary | ICD-10-CM | POA: Diagnosis not present

## 2015-08-19 DIAGNOSIS — R7989 Other specified abnormal findings of blood chemistry: Secondary | ICD-10-CM

## 2015-08-19 DIAGNOSIS — F3340 Major depressive disorder, recurrent, in remission, unspecified: Secondary | ICD-10-CM | POA: Insufficient documentation

## 2015-08-19 DIAGNOSIS — R81 Glycosuria: Secondary | ICD-10-CM | POA: Diagnosis not present

## 2015-08-19 DIAGNOSIS — F339 Major depressive disorder, recurrent, unspecified: Secondary | ICD-10-CM

## 2015-08-19 DIAGNOSIS — E669 Obesity, unspecified: Secondary | ICD-10-CM | POA: Diagnosis not present

## 2015-08-19 DIAGNOSIS — R5383 Other fatigue: Secondary | ICD-10-CM

## 2015-08-19 DIAGNOSIS — F17210 Nicotine dependence, cigarettes, uncomplicated: Secondary | ICD-10-CM

## 2015-08-19 HISTORY — DX: Major depressive disorder, recurrent, unspecified: F33.9

## 2015-08-19 LAB — POCT URINALYSIS DIPSTICK
BILIRUBIN UA: NEGATIVE
KETONES UA: NEGATIVE
Leukocytes, UA: NEGATIVE
Nitrite, UA: NEGATIVE
PH UA: 6
Protein, UA: NEGATIVE
RBC UA: NEGATIVE
Spec Grav, UA: 1.02
Urobilinogen, UA: 0.2

## 2015-08-19 MED ORDER — PAROXETINE HCL 20 MG PO TABS: ORAL_TABLET | ORAL | Status: DC

## 2015-08-19 MED ORDER — NITROFURANTOIN MONOHYD MACRO 100 MG PO CAPS: 100.0000 mg | ORAL_CAPSULE | Freq: Two times a day (BID) | ORAL | Status: AC

## 2015-08-19 NOTE — Patient Instructions (Addendum)
Check out [employer] [facility] for healthy recipes We'll get some labs today Start back on the Paxil Start the antibiotic Return in 4 weeks

## 2015-08-19 NOTE — Assessment & Plan Note (Signed)
So glad to hear that she is cutting back on her cigarettes; encouragement given

## 2015-08-19 NOTE — Progress Notes (Signed)
BP 122/64 mmHg  Pulse 87  Temp(Src) 99.2 F (37.3 C) (Oral)  Resp 14  Ht 5\' 3"  (1.6 m)  Wt 212 lb (96.163 kg)  BMI 37.56 kg/m2  SpO2 96%  LMP 07/25/2015   Subjective:    Patient ID: Olivia King, female    DOB: [birth year], 30 y.o.   MRN: [id number]  HPI: Olivia King is a 30 y.o. female  Chief Complaint  Patient presents with  . Urinary Tract Infection    onset 1week odor, frequency and presuure  . Depression   She has some bladder irritation, cramping and squeezing; foul odor x 1 week; no blood in urine; no fevers; lower abd discomfort; no back pain; one other UTI; vision is blurred, no dry mouth; urine showed trace glucose in urine dip test; no nocturia; no weight loss  The last time she was here, Dr. Nadine Counts put her on cymbalta but it made her really sleepy; she slept too much, and has kids; she realizes she needed something; she has also been on zoloft, but that made her cry; paxil really worked for her; no thoughts of SI/HI  She is smoking less now, using straw to inhale at times just as a substitute; also had tried some diet pills that have bee pollen in them; she says they are FDA approved  Depression screen [facility] 2/9 08/19/2015 06/25/2015 05/13/2015  Decreased Interest 2 0 0  Down, Depressed, Hopeless 1 0 0  PHQ - 2 Score 3 0 0  Altered sleeping 3 - -  Tired, decreased energy 3 - -  Change in appetite 3 - -  Feeling bad or failure about yourself  1 - -  Trouble concentrating 2 - -  Moving slowly or fidgety/restless 1 - -  Suicidal thoughts 0 - -  PHQ-9 Score 16 - -  Difficult doing work/chores Very difficult - -   Relevant past medical, surgical, family and social history reviewed and updated as indicated. Past Medical History  Diagnosis Date  . Migraine headache   . Cigarette smoker   . Major depressive disorder, recurrent ([place]) 08/19/2015    zoloft made her cry; cymbalta made her too sleepy   Past Surgical History  Procedure Laterality Date  .  Contraceptive implant     Family History  Problem Relation Age of Onset  . Hypertension Mother   . Hypertension Father    Social History  Substance Use Topics  . Smoking status: Current Every Day Smoker  . Smokeless tobacco: None  . Alcohol Use: No  smoking less now  Interim medical history since last visit reviewed. Allergies and medications reviewed and updated.  Review of Systems Per HPI unless specifically indicated above     Objective:    BP 122/64 mmHg  Pulse 87  Temp(Src) 99.2 F (37.3 C) (Oral)  Resp 14  Ht 5\' 3"  (1.6 m)  Wt 212 lb (96.163 kg)  BMI 37.56 kg/m2  SpO2 96%  LMP 07/25/2015  Wt Readings from Last 3 Encounters:  08/19/15 212 lb (96.163 kg)  07/23/15 212 lb (96.163 kg)  06/25/15 218 lb (98.884 kg)    Physical Exam  Constitutional: She appears well-developed and well-nourished.  Weight stable since last visit  Eyes: No scleral icterus.  Neck: No JVD present.  Cardiovascular: Normal rate and regular rhythm.   Pulmonary/Chest: Effort normal and breath sounds normal.  Abdominal: She exhibits no distension.  Musculoskeletal: She exhibits no edema.  Neurological: She is alert.  Skin: No pallor.  No palmar erythema  Psychiatric: She has a normal mood and affect. Her behavior is normal. Judgment and thought content normal.    Results for orders placed or performed in visit on 08/19/15  POCT urinalysis dipstick  Result Value Ref Range   Color, UA yellow    Clarity, UA clear    Glucose, UA trace    Bilirubin, UA neg    Ketones, UA neg    Spec Grav, UA 1.020    Blood, UA neg    pH, UA 6.0    Protein, UA neg    Urobilinogen, UA 0.2    Nitrite, UA neg    Leukocytes, UA Negative Negative      Assessment & Plan:   Problem List Items Addressed This Visit      Other   Cigarette smoker    So glad to hear that she is cutting back on her cigarettes; encouragement given      Other fatigue    Reviewed last TSH, glucose, CBC; will check  glucose today and vit D      Low serum vitamin D    Check level today      Relevant Orders   VITAMIN D 25 Hydroxy (Vit-D Deficiency, Fractures)   Major depressive disorder, recurrent ([facility])    She did not like zoloft or cymbalta b/c of side effects; she did best on paxil; restart, 10 mg daily x 6 days, then 20 mg daily; return in 4 weeks; discussed risks, seek help immediately if any thoughts of self-harm; f/u in 4 weeks      Relevant Medications   PARoxetine (PAXIL) 20 MG tablet    Other Visit Diagnoses    Urinary frequency    -  Primary    culture pending; start macrobid; if no germs, consider IC, refer to urology    Relevant Orders    POCT urinalysis dipstick (Completed)    Urine Culture    Glucosuria        check glucose today; realize that previous glucose readings were normal, but glucose in urine is new    Relevant Orders    Basic metabolic panel       Follow up plan: Return in about 4 weeks (around 09/16/2015) for follow-up.  An after-visit summary was printed and given to the patient at [place].  Please see the patient instructions which may contain other information and recommendations beyond what is mentioned above in the assessment and plan.  Meds ordered this encounter  Medications  . nitrofurantoin, macrocrystal-monohydrate, (MACROBID) 100 MG capsule    Sig: Take 1 capsule (100 mg total) by mouth 2 (two) times daily.    Dispense:  6 capsule    Refill:  0  . PARoxetine (PAXIL) 20 MG tablet    Sig: One-half of a pill by mouth x 6 days, then one whole pill daily    Dispense:  27 tablet    Refill:  0    Orders Placed This Encounter  Procedures  . Urine Culture  . Basic metabolic panel  . VITAMIN D 25 Hydroxy (Vit-D Deficiency, Fractures)  . POCT urinalysis dipstick

## 2015-08-19 NOTE — Assessment & Plan Note (Signed)
She did not like zoloft or cymbalta b/c of side effects; she did best on paxil; restart, 10 mg daily x 6 days, then 20 mg daily; return in 4 weeks; discussed risks, seek help immediately if any thoughts of self-harm; f/u in 4 weeks

## 2015-08-19 NOTE — Assessment & Plan Note (Signed)
I cannot condone any OTC diet pills; cautioned her to avoid anything that revs up metabolism, risk of cardiovascular disease

## 2015-08-19 NOTE — Assessment & Plan Note (Signed)
Reviewed last TSH, glucose, CBC; will check glucose today and vit D

## 2015-08-19 NOTE — Assessment & Plan Note (Signed)
Check level today 

## 2015-08-21 LAB — URINE CULTURE

## 2015-09-17 ENCOUNTER — Ambulatory Visit (INDEPENDENT_AMBULATORY_CARE_PROVIDER_SITE_OTHER): Admitting: Family Medicine

## 2015-09-17 ENCOUNTER — Encounter: Payer: Self-pay | Admitting: Family Medicine

## 2015-09-17 VITALS — BP 124/78 | HR 95 | Temp 98.9°F | Resp 18 | Wt 212.0 lb

## 2015-09-17 DIAGNOSIS — Z720 Tobacco use: Secondary | ICD-10-CM | POA: Diagnosis not present

## 2015-09-17 DIAGNOSIS — F330 Major depressive disorder, recurrent, mild: Secondary | ICD-10-CM

## 2015-09-17 DIAGNOSIS — R81 Glycosuria: Secondary | ICD-10-CM | POA: Diagnosis not present

## 2015-09-17 DIAGNOSIS — F17210 Nicotine dependence, cigarettes, uncomplicated: Secondary | ICD-10-CM

## 2015-09-17 MED ORDER — PAROXETINE HCL 20 MG PO TABS: ORAL_TABLET | ORAL | Status: DC

## 2015-09-17 NOTE — Assessment & Plan Note (Signed)
I am here to help if needed; see AVS

## 2015-09-17 NOTE — Progress Notes (Signed)
BP 124/78 mmHg  Pulse 95  Temp(Src) 98.9 F (37.2 C) (Oral)  Resp 18  Wt 212 lb (96.163 kg)  SpO2 98%  LMP 09/03/2015   Subjective:    Patient ID: Olivia King, female    DOB: 1985-06-29, 30 y.o.   MRN: [id number]  HPI: Olivia King is a 30 y.o. female  Chief Complaint  Patient presents with  . Depression    Paxil working well   She says that she is doing well on the paroxetine; has more patience; little things not bothering her as much No crying spells No mania or hypomania Sleeping okay No nausea or headaches Fatigue is so much better  Trace glucose in urine last time; not urinating frequently; no dry mouth; little bit of blurred vision but thinks she needs glasses  Vit D deficiency noted last fall; for some reason, she did not get labs done after last appt; we reviewed previous vit D, barely low at 29.9; she is taking supplement; energy is better with paxil  Depression screen [facility] 2/9 09/17/2015 08/19/2015 06/25/2015 05/13/2015  Decreased Interest 0 2 0 0  Down, Depressed, Hopeless 0 1 0 0  PHQ - 2 Score 0 3 0 0  Altered sleeping - 3 - -  Tired, decreased energy - 3 - -  Change in appetite - 3 - -  Feeling bad or failure about yourself  - 1 - -  Trouble concentrating - 2 - -  Moving slowly or fidgety/restless - 1 - -  Suicidal thoughts - 0 - -  PHQ-9 Score - 16 - -  Difficult doing work/chores - Very difficult - -   Relevant past medical, surgical, family and social history reviewed Past Medical History  Diagnosis Date  . Migraine headache   . Cigarette smoker   . Major depressive disorder, recurrent ([place]) 08/19/2015    zoloft made her cry; cymbalta made her too sleepy   Past Surgical History  Procedure Laterality Date  . Contraceptive implant     Social History  Substance Use Topics  . Smoking status: Current Every Day Smoker  . Smokeless tobacco: None  . Alcohol Use: 0.0 oz/week    0 Standard drinks or equivalent per week  she is smoking less  now  Interim medical history since last visit reviewed. Allergies and medications reviewed  Review of Systems Per HPI unless specifically indicated above     Objective:    BP 124/78 mmHg  Pulse 95  Temp(Src) 98.9 F (37.2 C) (Oral)  Resp 18  Wt 212 lb (96.163 kg)  SpO2 98%  LMP 09/03/2015  Wt Readings from Last 3 Encounters:  09/17/15 212 lb (96.163 kg)  08/19/15 212 lb (96.163 kg)  07/23/15 212 lb (96.163 kg)    Physical Exam  Constitutional: She appears well-developed and well-nourished.  HENT:  Mouth/Throat: Mucous membranes are normal.  Eyes: No scleral icterus.  Cardiovascular: Normal rate and regular rhythm.   Pulmonary/Chest: Effort normal.  Psychiatric: She has a normal mood and affect. Her behavior is normal.   Results for orders placed or performed in visit on 08/19/15  Urine Culture  Result Value Ref Range   Urine Culture, Routine Final report    Urine Culture result 1 Comment   POCT urinalysis dipstick  Result Value Ref Range   Color, UA yellow    Clarity, UA clear    Glucose, UA trace    Bilirubin, UA neg    Ketones, UA neg    Spec  Grav, UA 1.020    Blood, UA neg    pH, UA 6.0    Protein, UA neg    Urobilinogen, UA 0.2    Nitrite, UA neg    Leukocytes, UA Negative Negative      Assessment & Plan:   Problem List Items Addressed This Visit      Other   Cigarette smoker    I am here to help if needed; see AVS      Glucosuria    Check urine and BMP today      Relevant Orders   UA/M w/rflx Culture, Routine   Basic metabolic panel   Major depressive disorder, recurrent ([place]) - Primary    Doing well on current medicine; will refill; I am here if needed before next appt; otherwise, continue med and I'll see her in 6 months      Relevant Medications   PARoxetine (PAXIL) 20 MG tablet      Follow up plan: Return in about 6 months (around 03/19/2016) for follow-up, but I am here if needed.  An after-visit summary was printed and given  to the patient at [place].  Please see the patient instructions which may contain other information and recommendations beyond what is mentioned above in the assessment and plan.  Meds ordered this encounter  Medications  . PARoxetine (PAXIL) 20 MG tablet    Sig: One by mouth daily    Dispense:  30 tablet    Refill:  5   Orders Placed This Encounter  Procedures  . UA/M w/rflx Culture, Routine  . Basic metabolic panel

## 2015-09-17 NOTE — Assessment & Plan Note (Signed)
Check urine and BMP today

## 2015-09-17 NOTE — Assessment & Plan Note (Signed)
Doing well on current medicine; will refill; I am here if needed before next appt; otherwise, continue med and I'll see her in 6 months

## 2015-09-17 NOTE — Patient Instructions (Addendum)
Please do have urine and bloodwork done today Continue the current medicines I do encourage you to quit smoking Call [phone] to sign up for smoking cessation classes You can call 1-800-QUIT-NOW to talk with a smoking cessation coach  Steps to Quit Smoking  Smoking tobacco can be harmful to your health and can affect almost every organ in your body. Smoking puts you, and those around you, at risk for developing many serious chronic diseases. Quitting smoking is difficult, but it is one of the best things that you can do for your health. It is never too late to quit. WHAT ARE THE BENEFITS OF QUITTING SMOKING? When you quit smoking, you lower your risk of developing serious diseases and conditions, such as:  Lung cancer or lung disease, such as COPD.  Heart disease.  Stroke.  Heart attack.  Infertility.  Osteoporosis and bone fractures. Additionally, symptoms such as coughing, wheezing, and shortness of breath may get better when you quit. You may also find that you get sick less often because your body is stronger at fighting off colds and infections. If you are pregnant, quitting smoking can help to reduce your chances of having a baby of low birth weight. HOW DO I GET READY TO QUIT? When you decide to quit smoking, create a plan to make sure that you are successful. Before you quit:  Pick a date to quit. Set a date within the next two weeks to give you time to prepare.  Write down the reasons why you are quitting. Keep this list in places where you will see it often, such as on your bathroom mirror or in your car or wallet.  Identify the people, places, things, and activities that make you want to smoke (triggers) and avoid them. Make sure to take these actions:  Throw away all cigarettes at home, at work, and in your car.  Throw away smoking accessories, such as Scientist, research (medical).  Clean your car and make sure to empty the ashtray.  Clean your home, including curtains  and carpets.  Tell your family, friends, and coworkers that you are quitting. Support from your loved ones can make quitting easier.  Talk with your health care provider about your options for quitting smoking.  Find out what treatment options are covered by your health insurance. WHAT STRATEGIES CAN I USE TO QUIT SMOKING?  Talk with your healthcare provider about different strategies to quit smoking. Some strategies include:  Quitting smoking altogether instead of gradually lessening how much you smoke over a period of time. Research shows that quitting "cold [place]" is more successful than gradually quitting.  Attending in-person counseling to help you build problem-solving skills. You are more likely to have success in quitting if you attend several counseling sessions. Even short sessions of 10 minutes can be effective.  Finding resources and support systems that can help you to quit smoking and remain smoke-free after you quit. These resources are most helpful when you use them often. They can include:  Online chats with a Social worker.  Telephone quitlines.  Printed Furniture conservator/restorer.  Support groups or group counseling.  Text messaging programs.  Mobile phone applications.  Taking medicines to help you quit smoking. (If you are pregnant or breastfeeding, talk with your health care provider first.) Some medicines contain nicotine and some do not. Both types of medicines help with cravings, but the medicines that include nicotine help to relieve withdrawal symptoms. Your health care provider may recommend:  Nicotine patches, gum, or  lozenges.  Nicotine inhalers or sprays.  Non-nicotine medicine that is taken by mouth. Talk with your health care provider about combining strategies, such as taking medicines while you are also receiving in-person counseling. Using these two strategies together makes you more likely to succeed in quitting than if you used either strategy on its  own. If you are pregnant or breastfeeding, talk with your health care provider about finding counseling or other support strategies to quit smoking. Do not take medicine to help you quit smoking unless told to do so by your health care provider. WHAT THINGS CAN I DO TO MAKE IT EASIER TO QUIT? Quitting smoking might feel overwhelming at first, but there is a lot that you can do to make it easier. Take these important actions:  Reach out to your family and friends and ask that they support and encourage you during this time. Call telephone quitlines, reach out to support groups, or work with a counselor for support.  Ask people who smoke to avoid smoking around you.  Avoid places that trigger you to smoke, such as bars, parties, or smoke-break areas at work.  Spend time around people who do not smoke.  Lessen stress in your life, because stress can be a smoking trigger for some people. To lessen stress, try:  Exercising regularly.  Deep-breathing exercises.  Yoga.  Meditating.  Performing a body scan. This involves closing your eyes, scanning your body from head to toe, and noticing which parts of your body are particularly tense. Purposefully relax the muscles in those areas.  Download or purchase mobile phone or tablet apps (applications) that can help you stick to your quit plan by providing reminders, tips, and encouragement. There are many free apps, such as QuitGuide from the [employer] Office manager for Disease Control and Prevention). You can find other support for quitting smoking (smoking cessation) through smokefree.gov and other websites. HOW WILL I FEEL WHEN I QUIT SMOKING? Within the first 24 hours of quitting smoking, you may start to feel some withdrawal symptoms. These symptoms are usually most noticeable 2-3 days after quitting, but they usually do not last beyond 2-3 weeks. Changes or symptoms that you might experience include:  Mood swings.  Restlessness, anxiety, or  irritation.  Difficulty concentrating.  Dizziness.  Strong cravings for sugary foods in addition to nicotine.  Mild weight gain.  Constipation.  Nausea.  Coughing or a sore throat.  Changes in how your medicines work in your body.  A depressed mood.  Difficulty sleeping (insomnia). After the first 2-3 weeks of quitting, you may start to notice more positive results, such as:  Improved sense of smell and taste.  Decreased coughing and sore throat.  Slower heart rate.  Lower blood pressure.  Clearer skin.  The ability to breathe more easily.  Fewer sick days. Quitting smoking is very challenging for most people. Do not get discouraged if you are not successful the first time. Some people need to make many attempts to quit before they achieve long-term success. Do your best to stick to your quit plan, and talk with your health care provider if you have any questions or concerns.   This information is not intended to replace advice given to you by your health care provider. Make sure you discuss any questions you have with your health care provider.   Document Released: 03/31/2001 Document Revised: 08/21/2014 Document Reviewed: 08/21/2014 Elsevier Interactive Patient Education [employer].

## 2015-09-18 LAB — UA/M W/RFLX CULTURE, ROUTINE
Bilirubin, UA: NEGATIVE
Glucose, UA: NEGATIVE
Ketones, UA: NEGATIVE
LEUKOCYTES UA: NEGATIVE
Nitrite, UA: NEGATIVE
PH UA: 6.5 (ref 5.0–7.5)
PROTEIN UA: NEGATIVE
RBC, UA: NEGATIVE
SPEC GRAV UA: 1.014 (ref 1.005–1.030)
Urobilinogen, Ur: 0.2 mg/dL (ref 0.2–1.0)

## 2015-09-18 LAB — BASIC METABOLIC PANEL
BUN/Creatinine Ratio: 9 (ref 9–23)
BUN: 8 mg/dL (ref 6–20)
CALCIUM: 9.1 mg/dL (ref 8.7–10.2)
CO2: 19 mmol/L (ref 18–29)
CREATININE: 0.89 mg/dL (ref 0.57–1.00)
Chloride: 101 mmol/L (ref 96–106)
GFR calc non Af Amer: 87 mL/min/{1.73_m2} (ref >59)
GFR, EST AFRICAN AMERICAN: 101 mL/min/{1.73_m2} (ref >59)
Glucose: 91 mg/dL (ref 65–99)
Potassium: 4.9 mmol/L (ref 3.5–5.2)
Sodium: 137 mmol/L (ref 134–144)

## 2015-09-18 LAB — MICROSCOPIC EXAMINATION: CASTS: NONE SEEN /LPF

## 2016-01-06 ENCOUNTER — Encounter: Payer: Self-pay | Admitting: Family Medicine

## 2016-01-06 ENCOUNTER — Ambulatory Visit (INDEPENDENT_AMBULATORY_CARE_PROVIDER_SITE_OTHER): Admitting: Family Medicine

## 2016-01-06 DIAGNOSIS — F17210 Nicotine dependence, cigarettes, uncomplicated: Secondary | ICD-10-CM

## 2016-01-06 DIAGNOSIS — Z309 Encounter for contraceptive management, unspecified: Secondary | ICD-10-CM | POA: Insufficient documentation

## 2016-01-06 DIAGNOSIS — Z3009 Encounter for other general counseling and advice on contraception: Secondary | ICD-10-CM | POA: Diagnosis not present

## 2016-01-06 DIAGNOSIS — Z720 Tobacco use: Secondary | ICD-10-CM | POA: Diagnosis not present

## 2016-01-06 DIAGNOSIS — G43109 Migraine with aura, not intractable, without status migrainosus: Secondary | ICD-10-CM

## 2016-01-06 NOTE — Assessment & Plan Note (Addendum)
Refer to GYN; lengthy discussion about options, risks; see AVS

## 2016-01-06 NOTE — Patient Instructions (Addendum)
I do encourage you to quit smoking Call [phone] to sign up for smoking cessation classes You can call 1-800-QUIT-NOW to talk with a smoking cessation coach We'll refer you to the gynecologist for consideration of a copper T IUD    Smoking Cessation, Tips for Success If you are ready to quit smoking, congratulations! You have chosen to help yourself be healthier. Cigarettes bring nicotine, tar, carbon monoxide, and other irritants into your body. Your lungs, heart, and blood vessels will be able to work better without these poisons. There are many different ways to quit smoking. Nicotine gum, nicotine patches, a nicotine inhaler, or nicotine nasal spray can help with physical craving. Hypnosis, support groups, and medicines help break the habit of smoking. WHAT THINGS CAN I DO TO MAKE QUITTING EASIER?  Here are some tips to help you quit for good:  Pick a date when you will quit smoking completely. Tell all of your friends and family about your plan to quit on that date.  Do not try to slowly cut down on the number of cigarettes you are smoking. Pick a quit date and quit smoking completely starting on that day.  Throw away all cigarettes.   Clean and remove all ashtrays from your home, work, and car.  On a card, write down your reasons for quitting. Carry the card with you and read it when you get the urge to smoke.  Cleanse your body of nicotine. Drink enough water and fluids to keep your urine clear or pale yellow. Do this after quitting to flush the nicotine from your body.  Learn to predict your moods. Do not let a bad situation be your excuse to have a cigarette. Some situations in your life might tempt you into wanting a cigarette.  Never have "just one" cigarette. It leads to wanting another and another. Remind yourself of your decision to quit.  Change habits associated with smoking. If you smoked while driving or when feeling stressed, try other activities to replace  smoking. Stand up when drinking your coffee. Brush your teeth after eating. Sit in a different chair when you read the paper. Avoid alcohol while trying to quit, and try to drink fewer caffeinated beverages. Alcohol and caffeine may urge you to smoke.  Avoid foods and drinks that can trigger a desire to smoke, such as sugary or spicy foods and alcohol.  Ask people who smoke not to smoke around you.  Have something planned to do right after eating or having a cup of coffee. For example, plan to take a walk or exercise.  Try a relaxation exercise to calm you down and decrease your stress. Remember, you may be tense and nervous for the first 2 weeks after you quit, but this will pass.  Find new activities to keep your hands busy. Play with a pen, coin, or rubber band. Doodle or draw things on paper.  Brush your teeth right after eating. This will help cut down on the craving for the taste of tobacco after meals. You can also try mouthwash.   Use oral substitutes in place of cigarettes. Try using lemon drops, carrots, cinnamon sticks, or chewing gum. Keep them handy so they are available when you have the urge to smoke.  When you have the urge to smoke, try deep breathing.  Designate your home as a nonsmoking area.  If you are a heavy smoker, ask your health care provider about a prescription for nicotine chewing gum. It can ease your withdrawal from  nicotine.  Reward yourself. Set aside the cigarette money you save and buy yourself something nice.  Look for support from others. Join a support group or smoking cessation program. Ask someone at home or at work to help you with your plan to quit smoking.  Always ask yourself, "Do I need this cigarette or is this just a reflex?" Tell yourself, "Today, I choose not to smoke," or "I do not want to smoke." You are reminding yourself of your decision to quit.  Do not replace cigarette smoking with electronic cigarettes (commonly called  e-cigarettes). The safety of e-cigarettes is unknown, and some may contain harmful chemicals.  If you relapse, do not give up! Plan ahead and think about what you will do the next time you get the urge to smoke. HOW WILL I FEEL WHEN I QUIT SMOKING? You may have symptoms of withdrawal because your body is used to nicotine (the addictive substance in cigarettes). You may crave cigarettes, be irritable, feel very hungry, cough often, get headaches, or have difficulty concentrating. The withdrawal symptoms are only temporary. They are strongest when you first quit but will go away within 10-14 days. When withdrawal symptoms occur, stay in control. Think about your reasons for quitting. Remind yourself that these are signs that your body is healing and getting used to being without cigarettes. Remember that withdrawal symptoms are easier to treat than the major diseases that smoking can cause.  Even after the withdrawal is over, expect periodic urges to smoke. However, these cravings are generally short lived and will go away whether you smoke or not. Do not smoke! WHAT RESOURCES ARE AVAILABLE TO HELP ME QUIT SMOKING? Your health care provider can direct you to community resources or hospitals for support, which may include:  Group support.  Education.  Hypnosis.  Therapy.   This information is not intended to replace advice given to you by your health care provider. Make sure you discuss any questions you have with your health care provider.   Document Released: 01/03/2004 Document Revised: 04/27/2014 Document Reviewed: 09/22/2012 Elsevier Interactive Patient Education 2016 [place] Natural Family Planning (NFP) is a type of birth control without using any form of contraception. Women who use NFP should not have sexual intercourse when the ovary produces an egg (ovulation) during the menstrual cycle. The NFP method is safe and can prevent pregnancy. It is 75% effective  when practiced right. The man needs to also understand this method of birth control and the woman needs to be aware of how her body functions during her menstrual cycle. NFP can also be used as a method of getting pregnant.  HOW THE NFP METHOD WORKS  A woman's menstrual period usually happens every 28-30 days (it can vary from 23-35 days).  Ovulation happens 12-14 days before the start of the next menstrual period (the fertile period). The egg is fertile for 24 hours and the sperm can live for 3 days or more. If there is sexual intercourse at this time, pregnancy can occur. THERE ARE MANY TYPES OF NFP METHODS USED TO PREVENT PREGNANCY  The basal body temperature method. Often times, there is a slight increase of body temperature when a woman ovulates. Take your temperature every morning before getting out of bed. Write the temperature on a chart. An increase in the temperature shows ovulation has happened. Do not have sexual intercourse from the menstrual period up to three days after the increase in the temperature. Note that the body  temperature may increase as a result of fever, restless sleep, and working schedules.  The ovulation cervical mucus method. During the menstrual cycle, the cervical mucus changes from dry and sticky to wet and slippery. Check the mucus of the vagina every day to look for these changes. Just before ovulation, the mucus becomes wet and slippery. On the last day of wetness, ovulation happens. To avoid getting pregnant, sexual intercourse is safe for about 10 days after the menstrual period and on the dry mucus days. Do not have sexual intercourse when the mucus starts to show up and not until 4 days after the wet and slippery mucus goes away. Sexual intercourse after the 4 days have passed until the menstrual period starts is a safe time. Note that the mucus from the vagina can increase because of a vaginal or cervical infection, lubricants, some medicines, and sexual  excitement.  The symptothermal method. This method uses both the temperature and the ovulation methods. Combine the two methods above to prevent pregnancy.  The calendar method. Record your menstrual periods and length of the cycles for 6 months. This is helpful when the menstrual cycle varies in the length of the cycle. The length of a menstrual cycle is from day 1 of the present menstrual period to day 1 of the next menstrual period. Then, find your fertile days of the month and do not have sexual intercourse during that time. You may need help from your health care provider to find out your fertile days. There are some signs of ovulation that may be helpful when trying to find the time of ovulation. This includes vaginal spotting or abdominal cramps during the middle of your menstrual cycle. Not all women have these symptoms. YOU SHOULD NOT USE NFP IF:  You have very irregular menstrual periods and may skip months.  You have abnormal bleeding.  You have a vaginal or cervical infection.  You are on medicines that can affect the vaginal mucus or body temperature. These medicines include antibiotics, thyroid medicines, and antihistamines (cold and allergy medicine).   This information is not intended to replace advice given to you by your health care provider. Make sure you discuss any questions you have with your health care provider.   Document Released: 09/23/2007 Document Revised: 04/11/2013 Document Reviewed: 10/07/2012 Elsevier Interactive Patient Education [employer].

## 2016-01-06 NOTE — Progress Notes (Signed)
BP 116/78   Pulse 83   Temp 99 F (37.2 C) (Oral)   Resp 14   Wt 211 lb (95.7 kg)   LMP 12/28/2015   SpO2 97%   BMI 37.38 kg/m    Subjective:    Patient ID: Olivia King, female    DOB: 1985/08/31, 30 y.o.   MRN: [id number]  HPI: Armelia Groven is a 30 y.o. female  Chief Complaint  Patient presents with  . Contraception    nuva ring   She is here to discuss birth control She has done the nuva ring; she did the mirena but that was removed in June, has not been sexually active but now wanting to get prepared She did not have any problems except she kept getting BV; under stress at the time Aware that it does not protect against STDs She had an abnormal pap smear in 2011; had abnormal one, then got pregnant with daughter; they did not do it No current abdominal bleeding No personal hx of DVT; does get migraines with aura; has only had 2-3 since May She is a smoker; down to 1/3 ppd Considering future children Using condoms, she does not trust condoms She got fluid retention, acne, weight gain She was on furosemide just PRN from fluid retention  Depression screen [facility] 2/9 01/06/2016 09/17/2015 08/19/2015 06/25/2015 05/13/2015  Decreased Interest 0 0 2 0 0  Down, Depressed, Hopeless 0 0 1 0 0  PHQ - 2 Score 0 0 3 0 0  Altered sleeping - - 3 - -  Tired, decreased energy - - 3 - -  Change in appetite - - 3 - -  Feeling bad or failure about yourself  - - 1 - -  Trouble concentrating - - 2 - -  Moving slowly or fidgety/restless - - 1 - -  Suicidal thoughts - - 0 - -  PHQ-9 Score - - 16 - -  Difficult doing work/chores - - Very difficult - -   Relevant past medical, surgical, family and social history reviewed Past Medical History:  Diagnosis Date  . Cigarette smoker   . Major depressive disorder, recurrent ([place]) 08/19/2015   zoloft made her cry; cymbalta made her too sleepy  . Migraine headache    Past Surgical History:  Procedure Laterality Date  . contraceptive  implant     Family History  Problem Relation Age of Onset  . Hypertension Mother   . Hypertension Father    Social History  Substance Use Topics  . Smoking status: Current Every Day Smoker  . Smokeless tobacco: Not on file  . Alcohol use 0.0 oz/week  MD note: down to 1/3 ppd  Interim medical history since last visit reviewed. Allergies and medications reviewed  Review of Systems Per HPI unless specifically indicated above     Objective:    BP 116/78   Pulse 83   Temp 99 F (37.2 C) (Oral)   Resp 14   Wt 211 lb (95.7 kg)   LMP 12/28/2015   SpO2 97%   BMI 37.38 kg/m   Wt Readings from Last 3 Encounters:  01/06/16 211 lb (95.7 kg)  09/17/15 212 lb (96.2 kg)  08/19/15 212 lb (96.2 kg)    Physical Exam  Constitutional: She appears well-developed and well-nourished. No distress.  Eyes: EOM are normal. No scleral icterus.  Neck: No thyromegaly present.  Cardiovascular: Normal rate.   Pulmonary/Chest: Effort normal.  Abdominal: She exhibits no distension.  Skin: No pallor.  Psychiatric: She has a normal mood and affect. Her behavior is normal. Judgment and thought content normal.      Assessment & Plan:   Problem List Items Addressed This Visit      Cardiovascular and Mediastinum   Migraine with aura    Explained that combination contraception is not recommended in patients who have migraine with aura; discussed other options including progesterone only pill, Depo, IUD, NFP; she will consider and see GYN; discussed risk of stroke      Relevant Medications   furosemide (LASIX) 20 MG tablet     Other   Contraception management    Refer to GYN; lengthy discussion about options, risks; see AVS      Relevant Orders   Ambulatory referral to Gynecology   Cigarette smoker    Explained combination contraception in a smoker increases risk, especially if migraine with aura; urged her to quit smoking; see AVS       Other Visit Diagnoses   None.      Follow up  plan: No Follow-up on file.  An after-visit summary was printed and given to the patient at [facility].  Please see the patient instructions which may contain other information and recommendations beyond what is mentioned above in the assessment and plan.  Meds ordered this encounter  Medications  . furosemide (LASIX) 20 MG tablet    Sig: as needed.    Orders Placed This Encounter  Procedures  . Ambulatory referral to Gynecology

## 2016-01-06 NOTE — Assessment & Plan Note (Signed)
Explained that combination contraception is not recommended in patients who have migraine with aura; discussed other options including progesterone only pill, Depo, IUD, NFP; she will consider and see GYN; discussed risk of stroke

## 2016-01-06 NOTE — Assessment & Plan Note (Signed)
Explained combination contraception in a smoker increases risk, especially if migraine with aura; urged her to quit smoking; see AVS

## 2016-02-06 ENCOUNTER — Encounter: Payer: Self-pay | Admitting: Family Medicine

## 2016-02-06 DIAGNOSIS — Z975 Presence of (intrauterine) contraceptive device: Secondary | ICD-10-CM

## 2016-02-06 HISTORY — DX: Presence of (intrauterine) contraceptive device: Z97.5

## 2016-03-19 ENCOUNTER — Ambulatory Visit (INDEPENDENT_AMBULATORY_CARE_PROVIDER_SITE_OTHER): Admitting: Family Medicine

## 2016-03-19 ENCOUNTER — Encounter: Payer: Self-pay | Admitting: Family Medicine

## 2016-03-19 VITALS — BP 116/74 | HR 86 | Temp 99.0°F | Resp 14 | Ht 63.0 in | Wt 212.4 lb

## 2016-03-19 DIAGNOSIS — Z716 Tobacco abuse counseling: Secondary | ICD-10-CM

## 2016-03-19 DIAGNOSIS — G43109 Migraine with aura, not intractable, without status migrainosus: Secondary | ICD-10-CM

## 2016-03-19 DIAGNOSIS — R7989 Other specified abnormal findings of blood chemistry: Secondary | ICD-10-CM

## 2016-03-19 DIAGNOSIS — F330 Major depressive disorder, recurrent, mild: Secondary | ICD-10-CM

## 2016-03-19 DIAGNOSIS — R5383 Other fatigue: Secondary | ICD-10-CM | POA: Diagnosis not present

## 2016-03-19 DIAGNOSIS — Z114 Encounter for screening for human immunodeficiency virus [HIV]: Secondary | ICD-10-CM

## 2016-03-19 DIAGNOSIS — F17210 Nicotine dependence, cigarettes, uncomplicated: Secondary | ICD-10-CM | POA: Diagnosis not present

## 2016-03-19 DIAGNOSIS — E669 Obesity, unspecified: Secondary | ICD-10-CM | POA: Diagnosis not present

## 2016-03-19 DIAGNOSIS — Z720 Tobacco use: Secondary | ICD-10-CM | POA: Diagnosis not present

## 2016-03-19 LAB — COMPLETE METABOLIC PANEL WITH GFR
ALT: 12 U/L (ref 6–29)
AST: 17 U/L (ref 10–30)
Albumin: 3.8 g/dL (ref 3.6–5.1)
Alkaline Phosphatase: 55 U/L (ref 33–115)
BUN: 8 mg/dL (ref 7–25)
CHLORIDE: 104 mmol/L (ref 98–110)
CO2: 25 mmol/L (ref 20–31)
Calcium: 9.2 mg/dL (ref 8.6–10.2)
Creat: 0.97 mg/dL (ref 0.50–1.10)
GFR, EST NON AFRICAN AMERICAN: 78 mL/min (ref >=60)
GFR, Est African American: >89 mL/min (ref >=60)
GLUCOSE: 80 mg/dL (ref 65–99)
POTASSIUM: 4.4 mmol/L (ref 3.5–5.3)
SODIUM: 136 mmol/L (ref 135–146)
Total Bilirubin: 0.6 mg/dL (ref 0.2–1.2)
Total Protein: 7.2 g/dL (ref 6.1–8.1)

## 2016-03-19 LAB — LIPID PANEL
CHOL/HDL RATIO: 3.2 ratio (ref <5.0)
Cholesterol: 159 mg/dL (ref <200)
HDL: 50 mg/dL — ABNORMAL LOW (ref >50)
LDL Cholesterol: 98 mg/dL (ref <100)
Triglycerides: 54 mg/dL (ref <150)
VLDL: 11 mg/dL (ref <30)

## 2016-03-19 LAB — CBC WITH DIFFERENTIAL/PLATELET
BASOS ABS: 93 {cells}/uL (ref 0–200)
Basophils Relative: 1 %
EOS ABS: 186 {cells}/uL (ref 15–500)
EOS PCT: 2 %
HCT: 38.5 % (ref 35.0–45.0)
HEMOGLOBIN: 12.4 g/dL (ref 11.7–15.5)
LYMPHS ABS: 2418 {cells}/uL (ref 850–3900)
Lymphocytes Relative: 26 %
MCH: 29.5 pg (ref 27.0–33.0)
MCHC: 32.2 g/dL (ref 32.0–36.0)
MCV: 91.4 fL (ref 80.0–100.0)
MPV: 9.8 fL (ref 7.5–12.5)
Monocytes Absolute: 837 cells/uL (ref 200–950)
Monocytes Relative: 9 %
NEUTROS ABS: 5766 {cells}/uL (ref 1500–7800)
Neutrophils Relative %: 62 %
Platelets: 321 10*3/uL (ref 140–400)
RBC: 4.21 MIL/uL (ref 3.80–5.10)
RDW: 13.2 % (ref 11.0–15.0)
WBC: 9.3 10*3/uL (ref 3.8–10.8)

## 2016-03-19 MED ORDER — PAROXETINE HCL 10 MG PO TABS: 10.0000 mg | ORAL_TABLET | Freq: Every day | ORAL | 11 refills | Status: DC

## 2016-03-19 NOTE — Progress Notes (Signed)
BP 116/74 (BP Location: Left Arm, Patient Position: Sitting, Cuff Size: Normal)   Pulse 86   Temp 99 F (37.2 C) (Oral)   Resp 14   Ht 5\' 3"  (1.6 m)   Wt 212 lb 6 oz (96.3 kg)   LMP 01/20/2016   SpO2 98%   BMI 37.62 kg/m    Subjective:    Patient ID: Olivia King, female    DOB: March 28, 1986, 30 y.o.   MRN: [id number]  HPI: Olivia King is a 30 y.o. female  Chief Complaint  Patient presents with  . Follow-up     6 month    Migraines with aura; had not had any until she had the IUD put in, and now none in a week or so; triggers include perfurmes, bright sunshine, heat; stress  Vitamin D deficiency; taking 2,000 iu daily  She has no hx of high cholesterol; not in the family  Obesity; not drinking enough water; not any regular exercise  She is still smoking; she started a detox last week that cleanses the lungs and blood; she really cut back; she can never fully detox; it's a seven day detox she says; she says it's a pill that cleanses things, does parasites, all kinds of stuff; one pack lasts 4-5 days  Does not want flu shot  Mood is doing well; the pills are now every other day; was good until the IUD was put in; no sexual effects;   Depression screen [facility] 2/9 03/19/2016 01/06/2016 09/17/2015 08/19/2015 06/25/2015  Decreased Interest 0 0 0 2 0  Down, Depressed, Hopeless 0 0 0 1 0  PHQ - 2 Score 0 0 0 3 0  Altered sleeping - - - 3 -  Tired, decreased energy - - - 3 -  Change in appetite - - - 3 -  Feeling bad or failure about yourself  - - - 1 -  Trouble concentrating - - - 2 -  Moving slowly or fidgety/restless - - - 1 -  Suicidal thoughts - - - 0 -  PHQ-9 Score - - - 16 -  Difficult doing work/chores - - - Very difficult -   Relevant past medical, surgical, family and social history reviewed Past Medical History:  Diagnosis Date  . Cigarette smoker   . IUD contraception 02/06/2016   Placed October 5, 0000000 by Ardeth Perfect, [place]  . Major  depressive disorder, recurrent ([place]) 08/19/2015   zoloft made her cry; cymbalta made her too sleepy  . Migraine headache    Past Surgical History:  Procedure Laterality Date  . contraceptive implant     Family History  Problem Relation Age of Onset  . Hypertension Mother   . Hypertension Father    Social History  Substance Use Topics  . Smoking status: Current Every Day Smoker  . Smokeless tobacco: Never Used  . Alcohol use 0.0 oz/week  MD note: patches didn't work; smoking 1/4 to 1/5 ppd; tried wellbutrin too, chantix not covered by insurance  Interim medical history since last visit reviewed. Allergies and medications reviewed  Review of Systems Per HPI unless specifically indicated above     Objective:    BP 116/74 (BP Location: Left Arm, Patient Position: Sitting, Cuff Size: Normal)   Pulse 86   Temp 99 F (37.2 C) (Oral)   Resp 14   Ht 5\' 3"  (1.6 m)   Wt 212 lb 6 oz (96.3 kg)   LMP 01/20/2016   SpO2 98%  BMI 37.62 kg/m   Wt Readings from Last 3 Encounters:  03/19/16 212 lb 6 oz (96.3 kg)  01/06/16 211 lb (95.7 kg)  09/17/15 212 lb (96.2 kg)    Physical Exam  Constitutional: She appears well-developed and well-nourished. No distress.  HENT:  Head: Normocephalic and atraumatic.  Eyes: EOM are normal. No scleral icterus.  Neck: No thyromegaly present.  Cardiovascular: Normal rate and regular rhythm.   Pulmonary/Chest: Effort normal and breath sounds normal.  Abdominal: She exhibits no distension.  Musculoskeletal: She exhibits no edema.  Skin: No pallor.  Psychiatric: She has a normal mood and affect. Her behavior is normal. Judgment and thought content normal.      Assessment & Plan:   Problem List Items Addressed This Visit      Cardiovascular and Mediastinum   Migraine with aura - Primary    OTC Mg2+; avoid triggers; risk of stroke, call 911 for any symptoms      Relevant Medications   PARoxetine (PAXIL) 10 MG tablet     Other   Other  fatigue    Check TSH, CBC, glucose      Relevant Orders   TSH (Completed)   COMPLETE METABOLIC PANEL WITH GFR (Completed)   CBC with Differential/Platelet (Completed)   Obesity, Class II, BMI 35-39.9    Encouragement given      Relevant Orders   Lipid panel (Completed)   TSH (Completed)   Major depressive disorder, recurrent ([facility])    Continue paxil      Relevant Medications   PARoxetine (PAXIL) 10 MG tablet   Low serum vitamin D    Continue vitamin D 2,000 iu daily      Encounter for smoking cessation counseling    Encouraged patient to quit      Cigarette smoker    Encouraged cessation       Other Visit Diagnoses    Encounter for screening for HIV       Relevant Orders   HIV antibody (with reflex) (Completed)      Follow up plan: Return in about 1 year (around 03/19/2017) for follow-up.  An after-visit summary was printed and given to the patient at [facility].  Please see the patient instructions which may contain other information and recommendations beyond what is mentioned above in the assessment and plan.  Meds ordered this encounter  Medications  . cholecalciferol (VITAMIN D) 1000 units tablet    Sig: Take 1,000 Units by mouth daily.  Marland Kitchen PARoxetine (PAXIL) 10 MG tablet    Sig: Take 1 tablet (10 mg total) by mouth daily.    Dispense:  30 tablet    Refill:  11    Orders Placed This Encounter  Procedures  . HIV antibody (with reflex)  . Lipid panel  . TSH  . COMPLETE METABOLIC PANEL WITH GFR  . CBC with Differential/Platelet

## 2016-03-19 NOTE — Assessment & Plan Note (Signed)
Continue vitamin D 2,000 iu daily

## 2016-03-19 NOTE — Assessment & Plan Note (Signed)
Encouragement given 

## 2016-03-19 NOTE — Patient Instructions (Addendum)
I do encourage you to quit smoking Call [phone] to sign up for smoking cessation classes You can call 1-800-QUIT-NOW to talk with a smoking cessation coach  I do recommend yearly flu shots; for individuals who don't want flu shots, try to practice excellent hand hygiene, and avoid nursing homes, day cares, and hospitals during peak flu season; taking additional vitamin C daily during flu/cold season may help boost your immune system too  We'll get labs today If you have not heard anything from my staff in a week about any orders/referrals/studies from today, please contact us here to follow-up [phone]

## 2016-03-19 NOTE — Assessment & Plan Note (Signed)
Continue paxil °

## 2016-03-19 NOTE — Assessment & Plan Note (Signed)
Encouraged cessation.

## 2016-03-19 NOTE — Assessment & Plan Note (Signed)
OTC Mg2+; avoid triggers; risk of stroke, call 911 for any symptoms

## 2016-03-19 NOTE — Assessment & Plan Note (Signed)
Encouraged patient to quit

## 2016-03-19 NOTE — Assessment & Plan Note (Signed)
Check TSH, CBC, glucose

## 2016-03-20 LAB — HIV ANTIBODY (ROUTINE TESTING W REFLEX): HIV 1&2 Ab, 4th Generation: NONREACTIVE

## 2016-03-20 LAB — TSH: TSH: 2.49 m[IU]/L

## 2017-03-19 ENCOUNTER — Encounter: Payer: Self-pay | Admitting: Family Medicine

## 2017-03-19 ENCOUNTER — Ambulatory Visit (INDEPENDENT_AMBULATORY_CARE_PROVIDER_SITE_OTHER): Admitting: Family Medicine

## 2017-03-19 DIAGNOSIS — F419 Anxiety disorder, unspecified: Secondary | ICD-10-CM | POA: Diagnosis not present

## 2017-03-19 DIAGNOSIS — F330 Major depressive disorder, recurrent, mild: Secondary | ICD-10-CM | POA: Diagnosis not present

## 2017-03-19 MED ORDER — PAROXETINE HCL ER 12.5 MG PO TB24: 12.5000 mg | ORAL_TABLET | Freq: Every day | ORAL | 2 refills | Status: DC

## 2017-03-19 MED ORDER — BENZONATATE 100 MG PO CAPS: 100.0000 mg | ORAL_CAPSULE | Freq: Three times a day (TID) | ORAL | 0 refills | Status: DC | PRN

## 2017-03-19 NOTE — Progress Notes (Signed)
BP 118/82   Pulse 88   Temp 98.9 F (37.2 C) (Oral)   Resp 16   Wt 229 lb 11.2 oz (104.2 kg)   LMP 03/02/2017   SpO2 97%   BMI 40.69 kg/m    Subjective:    Patient ID: Olivia King, female    DOB: [birth year], 31 y.o.   MRN: [id number]  HPI: Olivia King is a 31 y.o. female  Chief Complaint  Patient presents with  . Follow-up    discuss medication    HPI She is here for a f/u She wants to discuss paxil; she says the 20 mg was too strong; the 10 mg is not strong enough some day She has been in a funk No SI/HI She cries sometimes Her life is okay, but just has a dark cloud No negative She was on the long-acting paxil Has a lot of anxiety going on in her life; her son is a teenager; worries a lot about things Sometimes wakes up in the middle of the night Father had depression too; she is a daddy's girl and he's just opened up about his struggles Sometimes trouble falling asleep; night-time awakenings Then she worries at night Might have gone to a gathering and then worries Did not tolerate cymbalta or zoloft She is getting used to the holidays Husband has been gone 8 years, sister gone 4 years, mother gone 5 years now  Sick for the past week, nasal thing and coughing; sore throat on Sunday but not bad, gone now  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  She had a pap smear before IUD  Depression screen [facility] 2/9 03/19/2017 03/19/2016 01/06/2016 09/17/2015 08/19/2015  Decreased Interest 0 0 0 0 2  Down, Depressed, Hopeless 0 0 0 0 1  PHQ - 2 Score 0 0 0 0 3  Altered sleeping - - - - 3  Tired, decreased energy - - - - 3  Change in appetite - - - - 3  Feeling bad or failure about yourself  - - - - 1  Trouble concentrating - - - - 2  Moving slowly or fidgety/restless - - - - 1  Suicidal thoughts - - - - 0  PHQ-9 Score - - - - 16  Difficult doing work/chores - - - - Very difficult    Relevant past medical, surgical, family and social history  reviewed Past Medical History:  Diagnosis Date  . Cigarette smoker   . IUD contraception 02/06/2016   Placed January 23, 3427 by Ardeth Perfect, [place]  . Major depressive disorder, recurrent ([place]) 08/19/2015   zoloft made her cry; cymbalta made her too sleepy  . Migraine headache    Past Surgical History:  Procedure Laterality Date  . contraceptive implant     Family History  Problem Relation Age of Onset  . Hypertension Mother   . Cancer Mother        pancreatic  . Pancreatic cancer Mother   . Hypertension Brother   . Aneurysm Maternal Grandmother   . Heart attack Maternal Grandfather   . Hypertension Paternal Grandmother   . Heart failure Sister    Social History   Tobacco Use  . Smoking status: Current Every Day Smoker  . Smokeless tobacco: Never Used  Substance Use Topics  . Alcohol use: Yes    Alcohol/week: 0.0 oz  . Drug use: No   Interim medical history since last visit reviewed. Allergies and medications reviewed  Review of  Systems Per HPI unless specifically indicated above     Objective:    BP 118/82   Pulse 88   Temp 98.9 F (37.2 C) (Oral)   Resp 16   Wt 229 lb 11.2 oz (104.2 kg)   LMP 03/02/2017   SpO2 97%   BMI 40.69 kg/m   Wt Readings from Last 3 Encounters:  03/19/17 229 lb 11.2 oz (104.2 kg)  03/19/16 212 lb 6 oz (96.3 kg)  01/06/16 211 lb (95.7 kg)    Physical Exam  Constitutional: She appears well-developed and well-nourished. No distress.  Morbidly obese, weight gain noted  HENT:  Head: Normocephalic and atraumatic.  Eyes: EOM are normal. No scleral icterus.  Neck: No thyromegaly present.  Cardiovascular: Normal rate and regular rhythm.  Pulmonary/Chest: Effort normal and breath sounds normal.  Abdominal: She exhibits no distension.  Musculoskeletal: She exhibits no edema.  Skin: No pallor.  Psychiatric: She has a normal mood and affect. Her behavior is normal. Judgment and thought content normal.      Assessment  & Plan:   Problem List Items Addressed This Visit      Other   Major depressive disorder, recurrent ([place])    Will change the paxil to the long-acting version, see AVS; close f/u      Relevant Medications   PARoxetine (PAXIL-CR) 12.5 MG 24 hr tablet   Anxiety    Start paxil CR, see AVS      Relevant Medications   PARoxetine (PAXIL-CR) 12.5 MG 24 hr tablet       Follow up plan: Return in about 3 weeks (around 04/09/2017) for follow-up visit with Dr. Sanda Klein.  An after-visit summary was printed and given to the patient at [facility].  Please see the patient instructions which may contain other information and recommendations beyond what is mentioned above in the assessment and plan.  Meds ordered this encounter  Medications  . PARoxetine (PAXIL-CR) 12.5 MG 24 hr tablet    Sig: Take 1 tablet (12.5 mg total) by mouth daily.    Dispense:  30 tablet    Refill:  2  . benzonatate (TESSALON PERLES) 100 MG capsule    Sig: Take 1 capsule (100 mg total) by mouth every 8 (eight) hours as needed for cough.    Dispense:  30 capsule    Refill:  0    No orders of the defined types were placed in this encounter.

## 2017-03-19 NOTE — Patient Instructions (Signed)
Stop the current paxil and start the newer long-acting version  12 Ways to Curb Anxiety  ?Anxiety is normal human sensation. It is what helped our ancestors survive the pitfalls of the wilderness. Anxiety is defined as experiencing worry or nervousness about an imminent event or something with an uncertain outcome. It is a feeling experienced by most people at some point in their lives. Anxiety can be triggered by a very personal issue, such as the illness of a loved one, or an event of global proportions, such as a refugee crisis. Some of the symptoms of anxiety are:  Feeling restless.  Having a feeling of impending danger.  Increased heart rate.  Rapid breathing. Sweating.  Shaking.  Weakness or feeling tired.  Difficulty concentrating on anything except the current worry.  Insomnia.  Stomach or bowel problems. What can we do about anxiety we may be feeling? There are many techniques to help manage stress and relax. Here are 12 ways you can reduce your anxiety almost immediately: 1. Turn off the constant feed of information. Take a social media sabbatical. Studies have shown that social media directly contributes to social anxiety.  2. Monitor your television viewing habits. Are you watching shows that are also contributing to your anxiety, such as 24-hour news stations? Try watching something else, or better yet, nothing at all. Instead, listen to music, read an inspirational book or practice a hobby. 3. Eat nutritious meals. Also, don't skip meals and keep healthful snacks on hand. Hunger and poor diet contributes to feeling anxious. 4. Sleep. Sleeping on a regular schedule for at least seven to eight hours a night will do wonders for your outlook when you are awake. 5. Exercise. Regular exercise will help rid your body of that anxious energy and help you get more restful sleep. 6. Try deep (diaphragmatic) breathing. Inhale slowly through your nose for five seconds and exhale through your  mouth. 7. Practice acceptance and gratitude. When anxiety hits, accept that there are things out of your control that shouldn't be of immediate concern.  8. Seek out humor. When anxiety strikes, watch a funny video, read jokes or call a friend who makes you laugh. Laughter is healing for our bodies and releases endorphins that are calming. 9. Stay positive. Take the effort to replace negative thoughts with positive ones. Try to see a stressful situation in a positive light. Try to come up with solutions rather than dwelling on the problem. 10. Figure out what triggers your anxiety. Keep a journal and make note of anxious moments and the events surrounding them. This will help you identify triggers you can avoid or even eliminate. 11. Talk to someone. Let a trusted friend, family member or even trained professional know that you are feeling overwhelmed and anxious. Verbalize what you are feeling and why.  12. Volunteer. If your anxiety is triggered by a crisis on a large scale, become an advocate and work to resolve the problem that is causing you unease. Anxiety is often unwelcome and can become overwhelming. If not kept in check, it can become a disorder that could require medical treatment. However, if you take the time to care for yourself and avoid the triggers that make you anxious, you will be able to find moments of relaxation and clarity that make your life much more enjoyable.

## 2017-03-24 DIAGNOSIS — F419 Anxiety disorder, unspecified: Secondary | ICD-10-CM | POA: Insufficient documentation

## 2017-03-24 NOTE — Assessment & Plan Note (Addendum)
Start paxil CR, see AVS

## 2017-03-24 NOTE — Assessment & Plan Note (Signed)
Will change the paxil to the long-acting version, see AVS; close f/u

## 2017-04-09 ENCOUNTER — Encounter: Payer: Self-pay | Admitting: Family Medicine

## 2017-04-09 ENCOUNTER — Ambulatory Visit (INDEPENDENT_AMBULATORY_CARE_PROVIDER_SITE_OTHER): Admitting: Family Medicine

## 2017-04-09 DIAGNOSIS — Z0000 Encounter for general adult medical examination without abnormal findings: Secondary | ICD-10-CM

## 2017-04-09 DIAGNOSIS — F330 Major depressive disorder, recurrent, mild: Secondary | ICD-10-CM

## 2017-04-09 MED ORDER — VORTIOXETINE HBR 10 MG PO TABS: ORAL_TABLET | ORAL | 0 refills | Status: DC

## 2017-04-09 NOTE — Assessment & Plan Note (Signed)
Labs only today, return for actual physical soon

## 2017-04-09 NOTE — Patient Instructions (Signed)
Stop the paxil (paroxetine) Start the new medicine tomorrow Let's get labs today Return in 3 weeks for a physical, but call sooner if needed 12 Ways to Curb Anxiety  ?Anxiety is normal human sensation. It is what helped our ancestors survive the pitfalls of the wilderness. Anxiety is defined as experiencing worry or nervousness about an imminent event or something with an uncertain outcome. It is a feeling experienced by most people at some point in their lives. Anxiety can be triggered by a very personal issue, such as the illness of a loved one, or an event of global proportions, such as a refugee crisis. Some of the symptoms of anxiety are:  Feeling restless.  Having a feeling of impending danger.  Increased heart rate.  Rapid breathing. Sweating.  Shaking.  Weakness or feeling tired.  Difficulty concentrating on anything except the current worry.  Insomnia.  Stomach or bowel problems. What can we do about anxiety we may be feeling? There are many techniques to help manage stress and relax. Here are 12 ways you can reduce your anxiety almost immediately: 1. Turn off the constant feed of information. Take a social media sabbatical. Studies have shown that social media directly contributes to social anxiety.  2. Monitor your television viewing habits. Are you watching shows that are also contributing to your anxiety, such as 24-hour news stations? Try watching something else, or better yet, nothing at all. Instead, listen to music, read an inspirational book or practice a hobby. 3. Eat nutritious meals. Also, don't skip meals and keep healthful snacks on hand. Hunger and poor diet contributes to feeling anxious. 4. Sleep. Sleeping on a regular schedule for at least seven to eight hours a night will do wonders for your outlook when you are awake. 5. Exercise. Regular exercise will help rid your body of that anxious energy and help you get more restful sleep. 6. Try deep (diaphragmatic)  breathing. Inhale slowly through your nose for five seconds and exhale through your mouth. 7. Practice acceptance and gratitude. When anxiety hits, accept that there are things out of your control that shouldn't be of immediate concern.  8. Seek out humor. When anxiety strikes, watch a funny video, read jokes or call a friend who makes you laugh. Laughter is healing for our bodies and releases endorphins that are calming. 9. Stay positive. Take the effort to replace negative thoughts with positive ones. Try to see a stressful situation in a positive light. Try to come up with solutions rather than dwelling on the problem. 10. Figure out what triggers your anxiety. Keep a journal and make note of anxious moments and the events surrounding them. This will help you identify triggers you can avoid or even eliminate. 11. Talk to someone. Let a trusted friend, family member or even trained professional know that you are feeling overwhelmed and anxious. Verbalize what you are feeling and why.  12. Volunteer. If your anxiety is triggered by a crisis on a large scale, become an advocate and work to resolve the problem that is causing you unease. Anxiety is often unwelcome and can become overwhelming. If not kept in check, it can become a disorder that could require medical treatment. However, if you take the time to care for yourself and avoid the triggers that make you anxious, you will be able to find moments of relaxation and clarity that make your life much more enjoyable.

## 2017-04-09 NOTE — Progress Notes (Signed)
BP 118/82   Pulse 93   Temp 98.6 F (37 C) (Oral)   Resp 16   Wt 229 lb (103.9 kg)   SpO2 98%   BMI 40.57 kg/m    Subjective:    Patient ID: Olivia King, female    DOB: 27-Jul-1985, 31 y.o.   MRN: [id number]  HPI: Olivia King is a 31 y.o. female  Chief Complaint  Patient presents with  . Follow-up    3 weeks new meds  . Depression    paxil not helping, it keeps her up at night and makes her aggitated/frustrated    HPI Patient is here for follow-up She was here in late November She thought the paxil 20 mg was too strong and the 10 mg was not strong enough She denied SI/HI, but did cry sometimes and was just in a funk She also had a fair amount of anxiety and night-time awakenings  Since last visit; no frustrated, really no benefit Not going to sleep until 2 or 3 am; has to be up at 6:30 am Went back to 10 mg plain paxil Still having anxiety too Tried zoloft, made her cry; has not been on lexapro She feels irritable  Depression screen [facility] 2/9 04/09/2017 03/19/2017 03/19/2016 01/06/2016 09/17/2015  Decreased Interest 1 0 0 0 0  Down, Depressed, Hopeless 0 0 0 0 0  PHQ - 2 Score 1 0 0 0 0  Altered sleeping - - - - -  Tired, decreased energy - - - - -  Change in appetite - - - - -  Feeling bad or failure about yourself  - - - - -  Trouble concentrating - - - - -  Moving slowly or fidgety/restless - - - - -  Suicidal thoughts - - - - -  PHQ-9 Score - - - - -  Difficult doing work/chores - - - - -    Relevant past medical, surgical, family and social history reviewed Past Medical History:  Diagnosis Date  . Cigarette smoker   . IUD contraception 02/06/2016   Placed January 23, 2408 by Ardeth Perfect, [place]  . Major depressive disorder, recurrent ([place]) 08/19/2015   zoloft made her cry; cymbalta made her too sleepy  . Migraine headache    Past Surgical History:  Procedure Laterality Date  . contraceptive implant     Family History    Problem Relation Age of Onset  . Hypertension Mother   . Cancer Mother        pancreatic  . Pancreatic cancer Mother   . Hypertension Brother   . Aneurysm Maternal Grandmother   . Heart attack Maternal Grandfather   . Hypertension Paternal Grandmother   . Heart failure Sister    Social History   Tobacco Use  . Smoking status: Current Every Day Smoker  . Smokeless tobacco: Never Used  Substance Use Topics  . Alcohol use: Yes    Alcohol/week: 0.0 oz  . Drug use: No    Interim medical history since last visit reviewed. Allergies and medications reviewed  Review of Systems Per HPI unless specifically indicated above     Objective:    BP 118/82   Pulse 93   Temp 98.6 F (37 C) (Oral)   Resp 16   Wt 229 lb (103.9 kg)   SpO2 98%   BMI 40.57 kg/m   Wt Readings from Last 3 Encounters:  04/09/17 229 lb (103.9 kg)  03/19/17 229 lb 11.2 oz (104.2  kg)  03/19/16 212 lb 6 oz (96.3 kg)    Physical Exam  Constitutional: She appears well-developed and well-nourished. No distress.  Morbidly obese  HENT:  Head: Normocephalic and atraumatic.  Eyes: EOM are normal. No scleral icterus.  Neck: No thyromegaly present.  Cardiovascular: Normal rate and regular rhythm.  Pulmonary/Chest: Effort normal and breath sounds normal.  Abdominal: She exhibits no distension.  Musculoskeletal: She exhibits no edema.  Skin: No pallor.  Psychiatric: She has a normal mood and affect.      Assessment & Plan:   Problem List Items Addressed This Visit      Other   Major depressive disorder, recurrent ([place])    Switch gears completely; stop the SSRI and start Trintellix; f/u in 3 weeks; call sooner if needed      Relevant Medications   vortioxetine HBr (TRINTELLIX) 10 MG TABS   Encounter for preventive care    Labs only today, return for actual physical soon      Relevant Orders   CBC with Differential/Platelet (Completed)   COMPLETE METABOLIC PANEL WITH GFR (Completed)   Lipid panel  (Completed)   TSH (Completed)     return for exam part of preventive care, well woman exam  Follow up plan: Return in about 3 weeks (around 04/30/2017) for complete physical.  An after-visit summary was printed and given to the patient at [facility].  Please see the patient instructions which may contain other information and recommendations beyond what is mentioned above in the assessment and plan.  Meds ordered this encounter  Medications  . vortioxetine HBr (TRINTELLIX) 10 MG TABS    Sig: One by mouth daily    Dispense:  30 tablet    Refill:  0    Pharmacist -- we're canceling all other SSRI prescriptions    Orders Placed This Encounter  Procedures  . CBC with Differential/Platelet  . COMPLETE METABOLIC PANEL WITH GFR  . Lipid panel  . TSH

## 2017-04-09 NOTE — Assessment & Plan Note (Signed)
Switch gears completely; stop the SSRI and start Trintellix; f/u in 3 weeks; call sooner if needed

## 2017-04-10 LAB — LIPID PANEL
CHOL/HDL RATIO: 3.6 (calc) (ref <5.0)
Cholesterol: 178 mg/dL (ref <200)
HDL: 49 mg/dL — AB (ref >50)
LDL Cholesterol (Calc): 110 mg/dL (calc) — ABNORMAL HIGH
NON-HDL CHOLESTEROL (CALC): 129 mg/dL (ref <130)
Triglycerides: 94 mg/dL (ref <150)

## 2017-04-10 LAB — COMPLETE METABOLIC PANEL WITH GFR
AG Ratio: 1.3 (calc) (ref 1.0–2.5)
ALT: 17 U/L (ref 6–29)
AST: 20 U/L (ref 10–30)
Albumin: 4 g/dL (ref 3.6–5.1)
Alkaline phosphatase (APISO): 64 U/L (ref 33–115)
BUN: 10 mg/dL (ref 7–25)
CALCIUM: 9 mg/dL (ref 8.6–10.2)
CO2: 25 mmol/L (ref 20–32)
CREATININE: 0.93 mg/dL (ref 0.50–1.10)
Chloride: 107 mmol/L (ref 98–110)
GFR, EST AFRICAN AMERICAN: 94 mL/min/{1.73_m2} (ref >=60)
GFR, EST NON AFRICAN AMERICAN: 81 mL/min/{1.73_m2} (ref >=60)
Globulin: 3.1 g/dL (calc) (ref 1.9–3.7)
Glucose, Bld: 75 mg/dL (ref 65–99)
POTASSIUM: 4.3 mmol/L (ref 3.5–5.3)
Sodium: 138 mmol/L (ref 135–146)
TOTAL PROTEIN: 7.1 g/dL (ref 6.1–8.1)
Total Bilirubin: 0.4 mg/dL (ref 0.2–1.2)

## 2017-04-10 LAB — CBC WITH DIFFERENTIAL/PLATELET
BASOS ABS: 104 {cells}/uL (ref 0–200)
Basophils Relative: 0.9 %
EOS PCT: 3 %
Eosinophils Absolute: 348 cells/uL (ref 15–500)
HEMATOCRIT: 35.1 % (ref 35.0–45.0)
HEMOGLOBIN: 12.1 g/dL (ref 11.7–15.5)
LYMPHS ABS: 3213 {cells}/uL (ref 850–3900)
MCH: 30.1 pg (ref 27.0–33.0)
MCHC: 34.5 g/dL (ref 32.0–36.0)
MCV: 87.3 fL (ref 80.0–100.0)
MONOS PCT: 7.5 %
MPV: 10.4 fL (ref 7.5–12.5)
NEUTROS ABS: 7064 {cells}/uL (ref 1500–7800)
Neutrophils Relative %: 60.9 %
Platelets: 328 10*3/uL (ref 140–400)
RBC: 4.02 10*6/uL (ref 3.80–5.10)
RDW: 12.4 % (ref 11.0–15.0)
Total Lymphocyte: 27.7 %
WBC mixed population: 870 cells/uL (ref 200–950)
WBC: 11.6 10*3/uL — AB (ref 3.8–10.8)

## 2017-04-10 LAB — TSH: TSH: 3.1 m[IU]/L

## 2017-04-12 ENCOUNTER — Telehealth: Payer: Self-pay

## 2017-04-12 NOTE — Telephone Encounter (Signed)
Called pt no answer. LM for pt information below per Dr.Lada. Advised pt to call back for questions or concerns. CRM created. Labs routed to [facility].

## 2017-04-12 NOTE — Telephone Encounter (Signed)
-----   Message from Arnetha Courser, MD sent at 04/12/2017  9:37 AM EST ----- Please let pt know that her white count was a little elevated on her labs from Friday; perhaps she is getting or was getting over an infection? Let me know about any symptoms (burning with urination, cough, etc.) The rest of her labs are pretty good; her HDL should be a little higher, and her LDL should be a little lower, but neither of her readings require medicine - just healthy eating, exercise, and weight management (easier said than done, I know) We wish her a very Merry Christmas!

## 2017-04-30 ENCOUNTER — Encounter: Payer: Self-pay | Admitting: Family Medicine

## 2017-04-30 ENCOUNTER — Ambulatory Visit (INDEPENDENT_AMBULATORY_CARE_PROVIDER_SITE_OTHER): Admitting: Family Medicine

## 2017-04-30 DIAGNOSIS — Z0000 Encounter for general adult medical examination without abnormal findings: Secondary | ICD-10-CM | POA: Diagnosis not present

## 2017-04-30 NOTE — Progress Notes (Signed)
Patient ID: Olivia King, female   DOB: 1986-02-19, 32 y.o.   MRN: [id number]   Subjective:   Olivia King is a 32 y.o. female here for a complete physical exam  Interim issues since last visit: no medical excitement  USPSTF grade A and B recommendations Depression:  Depression screen [facility] 2/9 04/30/2017 04/09/2017 03/19/2017 03/19/2016 01/06/2016  Decreased Interest 0 1 0 0 0  Down, Depressed, Hopeless 0 0 0 0 0  PHQ - 2 Score 0 1 0 0 0  Altered sleeping - - - - -  Tired, decreased energy - - - - -  Change in appetite - - - - -  Feeling bad or failure about yourself  - - - - -  Trouble concentrating - - - - -  Moving slowly or fidgety/restless - - - - -  Suicidal thoughts - - - - -  PHQ-9 Score - - - - -  Difficult doing work/chores - - - - -   Hypertension: very close BP Readings from Last 3 Encounters:  04/30/17 126/82  04/09/17 118/82  03/19/17 118/82   Obesity: went down over the holidays Wt Readings from Last 3 Encounters:  04/30/17 227 lb 4.8 oz (103.1 kg)  04/09/17 229 lb (103.9 kg)  03/19/17 229 lb 11.2 oz (104.2 kg)   BMI Readings from Last 3 Encounters:  04/30/17 40.26 kg/m  04/09/17 40.57 kg/m  03/19/17 40.69 kg/m    Skin cancer: no worrisome moles; derm checked her out Lung cancer:  Yearly chest CT at 59, but we are hopeful that she will quit Breast cancer: baseline mammo at age 20 Colorectal cancer: no fam hx; start at age 59  BRCA gene screening: family hx of breast and/or ovarian cancer and/or metastatic prostate cancer? No fam hx Cervical cancer screening: through GYN HIV, hep B, hep C: one year celibacy STD testing and prevention (chl/gon/syphilis): n/a Intimate partner violence: no abuse Contraception: IUD Osteoporosis: no hx of steroids Fall prevention/vitamin D: discussed  Diet: was taking the diet pills, purple not a Rx; has not calorie counted; will fast for 16 hours, then eat; touch to stick to things Exercise: was talking to a  personal trainer; 6.5 to 8k steps a day Alcohol:  Wine, just on Fridays, 1-2 glasses Tobacco use: smokes, tried everything; will try straw method; pack last 2.5 days Aspirin: n/a Lipids: UTD Lab Results  Component Value Date   CHOL 178 04/09/2017   CHOL 159 03/19/2016   CHOL 176 12/21/2014   Lab Results  Component Value Date   HDL 49 (L) 04/09/2017   HDL 50 (L) 03/19/2016   HDL 47 12/21/2014   Lab Results  Component Value Date   LDLCALC 98 03/19/2016   LDLCALC 113 (H) 12/21/2014   Lab Results  Component Value Date   TRIG 94 04/09/2017   TRIG 54 03/19/2016   TRIG 80 12/21/2014   Lab Results  Component Value Date   CHOLHDL 3.6 04/09/2017   CHOLHDL 3.2 03/19/2016   CHOLHDL 3.7 12/21/2014   No results found for: LDLDIRECT Glucose: 75 on 04/09/17  Glucose  Date Value Ref Range Status  09/17/2015 91 65 - 99 mg/dL Final  07/23/2015 89 65 - 99 mg/dL Final  12/21/2014 79 65 - 99 mg/dL Final   Glucose, Bld  Date Value Ref Range Status  04/09/2017 75 65 - 99 mg/dL Final    Comment:    .            Fasting  reference interval .   03/19/2016 80 65 - 99 mg/dL Final  AAA: n/a  Past Medical History:  Diagnosis Date  . Cigarette smoker   . IUD contraception 02/06/2016   Placed January 22, 8098 by Ardeth Perfect, [place]  . Major depressive disorder, recurrent ([place]) 08/19/2015   zoloft made her cry; cymbalta made her too sleepy  . Migraine headache    Past Surgical History:  Procedure Laterality Date  . contraceptive implant     Family History  Problem Relation Age of Onset  . Hypertension Mother   . Cancer Mother        pancreatic  . Pancreatic cancer Mother   . Hypertension Brother   . Aneurysm Maternal Grandmother   . Heart attack Maternal Grandfather   . Hypertension Paternal Grandmother   . Heart failure Sister    Social History   Tobacco Use  . Smoking status: Current Every Day Smoker  . Smokeless tobacco: Never Used  Substance Use Topics   . Alcohol use: Yes    Alcohol/week: 0.0 oz  . Drug use: No   Review of Systems  Objective:   Vitals:   04/30/17 1502  BP: 126/82  Pulse: 98  Resp: 14  Temp: 98.3 F (36.8 C)  TempSrc: Oral  SpO2: 96%  Weight: 227 lb 4.8 oz (103.1 kg)  Height: 5' 3"  (1.6 m)   Body mass index is 40.26 kg/m. Wt Readings from Last 3 Encounters:  04/30/17 227 lb 4.8 oz (103.1 kg)  04/09/17 229 lb (103.9 kg)  03/19/17 229 lb 11.2 oz (104.2 kg)   Physical Exam  Constitutional: She appears well-developed and well-nourished.  Morbidly obese  HENT:  Head: Normocephalic and atraumatic.  Right Ear: Hearing, tympanic membrane, external ear and ear canal normal.  Left Ear: Hearing, tympanic membrane, external ear and ear canal normal.  Eyes: Conjunctivae and EOM are normal. Right eye exhibits no hordeolum. Left eye exhibits no hordeolum. No scleral icterus.  Neck: Carotid bruit is not present. No thyromegaly present.  Cardiovascular: Normal rate, regular rhythm, S1 normal, S2 normal and normal heart sounds.  No extrasystoles are present.  Pulmonary/Chest: Effort normal and breath sounds normal. No respiratory distress.  Abdominal: Soft. Normal appearance and bowel sounds are normal. She exhibits no distension, no abdominal bruit, no pulsatile midline mass and no mass. There is no hepatosplenomegaly. There is no tenderness. No hernia.  Musculoskeletal: Normal range of motion. She exhibits no edema.  Lymphadenopathy:       Head (right side): No submandibular adenopathy present.       Head (left side): No submandibular adenopathy present.    She has no cervical adenopathy.  Neurological: She is alert. She displays no tremor. No cranial nerve deficit. She exhibits normal muscle tone. Gait normal.  Reflex Scores:      Patellar reflexes are 2+ on the right side and 2+ on the left side. Skin: Skin is warm and dry. No bruising and no ecchymosis noted. No cyanosis. No pallor.  Psychiatric: Her speech is  normal and behavior is normal. Thought content normal. Her mood appears not anxious. She does not exhibit a depressed mood.    Assessment/Plan:   Problem List Items Addressed This Visit      Other   Preventative health care    USPSTF grade A and B recommendations reviewed with patient; age-appropriate recommendations, preventive care, screening tests, etc discussed and encouraged; healthy living encouraged; see AVS for patient education given to patient  No orders of the defined types were placed in this encounter.  No orders of the defined types were placed in this encounter.   Follow up plan: Return in about 2 weeks (around 05/17/2017) for follow-up visit with Dr. Sanda Klein.  An After Visit Summary was printed and given to the patient.

## 2017-04-30 NOTE — Patient Instructions (Addendum)
Check out the information at familydoctor.org entitled "Nutrition for Weight Loss: What You Need to Know about Fad Diets" Try to lose between 1-2 pounds per week by taking in fewer calories and burning off more calories You can succeed by limiting portions, limiting foods dense in calories and fat, becoming more active, and drinking 8 glasses of water a day (64 ounces) Don't skip meals, especially breakfast, as skipping meals may alter your metabolism Do not use over-the-counter weight loss pills or gimmicks that claim rapid weight loss A healthy BMI (or body mass index) is between 18.5 and 24.9 You can calculate your ideal BMI at the [place] website ClubMonetize.fr  Use MyFitnessPal to do a total calorie count for 3 days and bring that with you Get 7+ minutes of cardio 3-5 days a week Return for weight loss visit  Try The Scientific 7-Minute Workout   Take 1000 iu of vitamin D3 daily through the winter and on days when you don't get 20 minutes of sunshine    DASH Eating Plan DASH stands for "Dietary Approaches to Stop Hypertension." The DASH eating plan is a healthy eating plan that has been shown to reduce high blood pressure (hypertension). It may also reduce your risk for type 2 diabetes, heart disease, and stroke. The DASH eating plan may also help with weight loss. What are tips for following this plan? General guidelines  Avoid eating more than 2,300 mg (milligrams) of salt (sodium) a day. If you have hypertension, you may need to reduce your sodium intake to 1,500 mg a day.  Limit alcohol intake to no more than 1 drink a day for nonpregnant women and 2 drinks a day for men. One drink equals 12 oz of beer, 5 oz of wine, or 1 oz of hard liquor.  Work with your health care provider to maintain a healthy body weight or to lose weight. Ask what an ideal weight is for you.  Get at least 30 minutes of exercise that causes your heart to  beat faster (aerobic exercise) most days of the week. Activities may include walking, swimming, or biking.  Work with your health care provider or diet and nutrition specialist (dietitian) to adjust your eating plan to your individual calorie needs. Reading food labels  Check food labels for the amount of sodium per serving. Choose foods with less than 5 percent of the Daily Value of sodium. Generally, foods with less than 300 mg of sodium per serving fit into this eating plan.  To find whole grains, look for the word "whole" as the first word in the ingredient list. Shopping  Buy products labeled as "low-sodium" or "no salt added."  Buy fresh foods. Avoid canned foods and premade or frozen meals. Cooking  Avoid adding salt when cooking. Use salt-free seasonings or herbs instead of table salt or sea salt. Check with your health care provider or pharmacist before using salt substitutes.  Do not fry foods. Cook foods using healthy methods such as baking, boiling, grilling, and broiling instead.  Cook with heart-healthy oils, such as olive, canola, soybean, or sunflower oil. Meal planning   Eat a balanced diet that includes: ? 5 or more servings of fruits and vegetables each day. At each meal, try to fill half of your plate with fruits and vegetables. ? Up to 6-8 servings of whole grains each day. ? Less than 6 oz of lean meat, poultry, or fish each day. A 3-oz serving of meat is about the same size as  a deck of cards. One egg equals 1 oz. ? 2 servings of low-fat dairy each day. ? A serving of nuts, seeds, or beans 5 times each week. ? Heart-healthy fats. Healthy fats called Omega-3 fatty acids are found in foods such as flaxseeds and coldwater fish, like sardines, salmon, and mackerel.  Limit how much you eat of the following: ? Canned or prepackaged foods. ? Food that is high in trans fat, such as fried foods. ? Food that is high in saturated fat, such as fatty meat. ? Sweets,  desserts, sugary drinks, and other foods with added sugar. ? Full-fat dairy products.  Do not salt foods before eating.  Try to eat at least 2 vegetarian meals each week.  Eat more home-cooked food and less restaurant, buffet, and fast food.  When eating at a restaurant, ask that your food be prepared with less salt or no salt, if possible. What foods are recommended? The items listed may not be a complete list. Talk with your dietitian about what dietary choices are best for you. Grains Whole-grain or whole-wheat bread. Whole-grain or whole-wheat pasta. Brown rice. [place]. Bulgur. Whole-grain and low-sodium cereals. Pita bread. Low-fat, low-sodium crackers. Whole-wheat flour tortillas. Vegetables Fresh or frozen vegetables (raw, steamed, roasted, or grilled). Low-sodium or reduced-sodium tomato and vegetable juice. Low-sodium or reduced-sodium tomato sauce and tomato paste. Low-sodium or reduced-sodium canned vegetables. Fruits All fresh, dried, or frozen fruit. Canned fruit in natural juice (without added sugar). Meat and other protein foods Skinless chicken or [place]. Ground chicken or [place]. Pork with fat trimmed off. Fish and seafood. Egg whites. Dried beans, peas, or lentils. Unsalted nuts, nut butters, and seeds. Unsalted canned beans. Lean cuts of beef with fat trimmed off. Low-sodium, lean deli meat. Dairy Low-fat (1%) or fat-free (skim) milk. Fat-free, low-fat, or reduced-fat cheeses. Nonfat, low-sodium ricotta or cottage cheese. Low-fat or nonfat yogurt. Low-fat, low-sodium cheese. Fats and oils Soft margarine without trans fats. Vegetable oil. Low-fat, reduced-fat, or light mayonnaise and salad dressings (reduced-sodium). Canola, safflower, olive, soybean, and sunflower oils. Avocado. Seasoning and other foods Herbs. Spices. Seasoning mixes without salt. Unsalted popcorn and pretzels. Fat-free sweets. What foods are not recommended? The items listed may not be a  complete list. Talk with your dietitian about what dietary choices are best for you. Grains Baked goods made with fat, such as croissants, muffins, or some breads. Dry pasta or rice meal packs. Vegetables Creamed or fried vegetables. Vegetables in a cheese sauce. Regular canned vegetables (not low-sodium or reduced-sodium). Regular canned tomato sauce and paste (not low-sodium or reduced-sodium). Regular tomato and vegetable juice (not low-sodium or reduced-sodium). Angie Fava. Olives. Fruits Canned fruit in a light or heavy syrup. Fried fruit. Fruit in cream or butter sauce. Meat and other protein foods Fatty cuts of meat. Ribs. Fried meat. Berniece Salines. Sausage. Bologna and other processed lunch meats. Salami. Fatback. Hotdogs. Bratwurst. Salted nuts and seeds. Canned beans with added salt. Canned or smoked fish. Whole eggs or egg yolks. Chicken or [place] with skin. Dairy Whole or 2% milk, cream, and half-and-half. Whole or full-fat cream cheese. Whole-fat or sweetened yogurt. Full-fat cheese. Nondairy creamers. Whipped toppings. Processed cheese and cheese spreads. Fats and oils Butter. Stick margarine. Lard. Shortening. Ghee. Bacon fat. Tropical oils, such as coconut, palm kernel, or palm oil. Seasoning and other foods Salted popcorn and pretzels. Onion salt, garlic salt, seasoned salt, table salt, and sea salt. Worcestershire sauce. Tartar sauce. Barbecue sauce. Teriyaki sauce. Soy sauce, including reduced-sodium. Steak sauce.  Canned and packaged gravies. Fish sauce. Oyster sauce. Cocktail sauce. Horseradish that you find on the shelf. Ketchup. Mustard. Meat flavorings and tenderizers. Bouillon cubes. Hot sauce and Tabasco sauce. Premade or packaged marinades. Premade or packaged taco seasonings. Relishes. Regular salad dressings. Where to find more information:  National Heart, Lung, and [place]: https://wilson-eaton.com/  American Heart Association: www.heart.org Summary  The DASH eating plan is a  healthy eating plan that has been shown to reduce high blood pressure (hypertension). It may also reduce your risk for type 2 diabetes, heart disease, and stroke.  With the DASH eating plan, you should limit salt (sodium) intake to 2,300 mg a day. If you have hypertension, you may need to reduce your sodium intake to 1,500 mg a day.  When on the DASH eating plan, aim to eat more fresh fruits and vegetables, whole grains, lean proteins, low-fat dairy, and heart-healthy fats.  Work with your health care provider or diet and nutrition specialist (dietitian) to adjust your eating plan to your individual calorie needs. This information is not intended to replace advice given to you by your health care provider. Make sure you discuss any questions you have with your health care provider. Document Released: 03/26/2011 Document Revised: 03/30/2016 Document Reviewed: 03/30/2016 Elsevier Interactive Patient Education  2018 [place] Maintenance, Female Adopting a healthy lifestyle and getting preventive care can go a long way to promote health and wellness. Talk with your health care provider about what schedule of regular examinations is right for you. This is a good chance for you to check in with your provider about disease prevention and staying healthy. In between checkups, there are plenty of things you can do on your own. Experts have done a lot of research about which lifestyle changes and preventive measures are most likely to keep you healthy. Ask your health care provider for more information. Weight and diet Eat a healthy diet  Be sure to include plenty of vegetables, fruits, low-fat dairy products, and lean protein.  Do not eat a lot of foods high in solid fats, added sugars, or salt.  Get regular exercise. This is one of the most important things you can do for your health. ? Most adults should exercise for at least 150 minutes each week. The exercise should increase your heart  rate and make you sweat (moderate-intensity exercise). ? Most adults should also do strengthening exercises at least twice a week. This is in addition to the moderate-intensity exercise.  Maintain a healthy weight  Body mass index (BMI) is a measurement that can be used to identify possible weight problems. It estimates body fat based on height and weight. Your health care provider can help determine your BMI and help you achieve or maintain a healthy weight.  For females 63 years of age and older: ? A BMI below 18.5 is considered underweight. ? A BMI of 18.5 to 24.9 is normal. ? A BMI of 25 to 29.9 is considered overweight. ? A BMI of 30 and above is considered obese.  Watch levels of cholesterol and blood lipids  You should start having your blood tested for lipids and cholesterol at 33 years of age, then have this test every 5 years.  You may need to have your cholesterol levels checked more often if: ? Your lipid or cholesterol levels are high. ? You are older than 33 years of age. ? You are at high risk for heart disease.  Cancer screening Lung Cancer  Lung cancer  screening is recommended for adults 74-15 years old who are at high risk for lung cancer because of a history of smoking.  A yearly low-dose CT scan of the lungs is recommended for people who: ? Currently smoke. ? Have quit within the past 15 years. ? Have at least a 30-pack-year history of smoking. A pack year is smoking an average of one pack of cigarettes a day for 1 year.  Yearly screening should continue until it has been 15 years since you quit.  Yearly screening should stop if you develop a health problem that would prevent you from having lung cancer treatment.  Breast Cancer  Practice breast self-awareness. This means understanding how your breasts normally appear and feel.  It also means doing regular breast self-exams. Let your health care provider know about any changes, no matter how small.  If  you are in your 20s or 30s, you should have a clinical breast exam (CBE) by a health care provider every 1-3 years as part of a regular health exam.  If you are 44 or older, have a CBE every year. Also consider having a breast X-ray (mammogram) every year.  If you have a family history of breast cancer, talk to your health care provider about genetic screening.  If you are at high risk for breast cancer, talk to your health care provider about having an MRI and a mammogram every year.  Breast cancer gene (BRCA) assessment is recommended for women who have family members with BRCA-related cancers. BRCA-related cancers include: ? Breast. ? Ovarian. ? Tubal. ? Peritoneal cancers.  Results of the assessment will determine the need for genetic counseling and BRCA1 and BRCA2 testing.  Cervical Cancer Your health care provider may recommend that you be screened regularly for cancer of the pelvic organs (ovaries, uterus, and vagina). This screening involves a pelvic examination, including checking for microscopic changes to the surface of your cervix (Pap test). You may be encouraged to have this screening done every 3 years, beginning at age 69.  For women ages 52-65, health care providers may recommend pelvic exams and Pap testing every 3 years, or they may recommend the Pap and pelvic exam, combined with testing for human papilloma virus (HPV), every 5 years. Some types of HPV increase your risk of cervical cancer. Testing for HPV may also be done on women of any age with unclear Pap test results.  Other health care providers may not recommend any screening for nonpregnant women who are considered low risk for pelvic cancer and who do not have symptoms. Ask your health care provider if a screening pelvic exam is right for you.  If you have had past treatment for cervical cancer or a condition that could lead to cancer, you need Pap tests and screening for cancer for at least 20 years after your  treatment. If Pap tests have been discontinued, your risk factors (such as having a new sexual partner) need to be reassessed to determine if screening should resume. Some women have medical problems that increase the chance of getting cervical cancer. In these cases, your health care provider may recommend more frequent screening and Pap tests.  Colorectal Cancer  This type of cancer can be detected and often prevented.  Routine colorectal cancer screening usually begins at 33 years of age and continues through 33 years of age.  Your health care provider may recommend screening at an earlier age if you have risk factors for colon cancer.  Your health care  provider may also recommend using home test kits to check for hidden blood in the stool.  A small camera at the end of a tube can be used to examine your colon directly (sigmoidoscopy or colonoscopy). This is done to check for the earliest forms of colorectal cancer.  Routine screening usually begins at age 26.  Direct examination of the colon should be repeated every 5-10 years through 33 years of age. However, you may need to be screened more often if early forms of precancerous polyps or small growths are found.  Skin Cancer  Check your skin from head to toe regularly.  Tell your health care provider about any new moles or changes in moles, especially if there is a change in a mole's shape or color.  Also tell your health care provider if you have a mole that is larger than the size of a pencil eraser.  Always use sunscreen. Apply sunscreen liberally and repeatedly throughout the day.  Protect yourself by wearing long sleeves, pants, a wide-brimmed hat, and sunglasses whenever you are outside.  Heart disease, diabetes, and high blood pressure  High blood pressure causes heart disease and increases the risk of stroke. High blood pressure is more likely to develop in: ? People who have blood pressure in the high end of the normal  range (130-139/85-89 mm Hg). ? People who are overweight or obese. ? People who are African American.  If you are 36-19 years of age, have your blood pressure checked every 3-5 years. If you are 28 years of age or older, have your blood pressure checked every year. You should have your blood pressure measured twice-once when you are at a hospital or clinic, and once when you are not at a hospital or clinic. Record the average of the two measurements. To check your blood pressure when you are not at a hospital or clinic, you can use: ? An automated blood pressure machine at a pharmacy. ? A home blood pressure monitor.  If you are between 72 years and 63 years old, ask your health care provider if you should take aspirin to prevent strokes.  Have regular diabetes screenings. This involves taking a blood sample to check your fasting blood sugar level. ? If you are at a normal weight and have a low risk for diabetes, have this test once every three years after 33 years of age. ? If you are overweight and have a high risk for diabetes, consider being tested at a younger age or more often. Preventing infection Hepatitis B  If you have a higher risk for hepatitis B, you should be screened for this virus. You are considered at high risk for hepatitis B if: ? You were born in a country where hepatitis B is common. Ask your health care provider which countries are considered high risk. ? Your parents were born in a high-risk country, and you have not been immunized against hepatitis B (hepatitis B vaccine). ? You have HIV or AIDS. ? You use needles to inject street drugs. ? You live with someone who has hepatitis B. ? You have had sex with someone who has hepatitis B. ? You get hemodialysis treatment. ? You take certain medicines for conditions, including cancer, organ transplantation, and autoimmune conditions.  Hepatitis C  Blood testing is recommended for: ? Everyone born from [birth year] through  1966. ? Anyone with known risk factors for hepatitis C.  Sexually transmitted infections (STIs)  You should be screened for sexually transmitted  infections (STIs) including gonorrhea and chlamydia if: ? You are sexually active and are younger than 33 years of age. ? You are older than 33 years of age and your health care provider tells you that you are at risk for this type of infection. ? Your sexual activity has changed since you were last screened and you are at an increased risk for chlamydia or gonorrhea. Ask your health care provider if you are at risk.  If you do not have HIV, but are at risk, it may be recommended that you take a prescription medicine daily to prevent HIV infection. This is called pre-exposure prophylaxis (PrEP). You are considered at risk if: ? You are sexually active and do not regularly use condoms or know the HIV status of your partner(s). ? You take drugs by injection. ? You are sexually active with a partner who has HIV.  Talk with your health care provider about whether you are at high risk of being infected with HIV. If you choose to begin PrEP, you should first be tested for HIV. You should then be tested every 3 months for as long as you are taking PrEP. Pregnancy  If you are premenopausal and you may become pregnant, ask your health care provider about preconception counseling.  If you may become pregnant, take 400 to 800 micrograms (mcg) of folic acid every day.  If you want to prevent pregnancy, talk to your health care provider about birth control (contraception). Osteoporosis and menopause  Osteoporosis is a disease in which the bones lose minerals and strength with aging. This can result in serious bone fractures. Your risk for osteoporosis can be identified using a bone density scan.  If you are 70 years of age or older, or if you are at risk for osteoporosis and fractures, ask your health care provider if you should be screened.  Ask your health  care provider whether you should take a calcium or vitamin D supplement to lower your risk for osteoporosis.  Menopause may have certain physical symptoms and risks.  Hormone replacement therapy may reduce some of these symptoms and risks. Talk to your health care provider about whether hormone replacement therapy is right for you. Follow these instructions at home:  Schedule regular health, dental, and eye exams.  Stay current with your immunizations.  Do not use any tobacco products including cigarettes, chewing tobacco, or electronic cigarettes.  If you are pregnant, do not drink alcohol.  If you are breastfeeding, limit how much and how often you drink alcohol.  Limit alcohol intake to no more than 1 drink per day for nonpregnant women. One drink equals 12 ounces of beer, 5 ounces of wine, or 1 ounces of hard liquor.  Do not use street drugs.  Do not share needles.  Ask your health care provider for help if you need support or information about quitting drugs.  Tell your health care provider if you often feel depressed.  Tell your health care provider if you have ever been abused or do not feel safe at home. This information is not intended to replace advice given to you by your health care provider. Make sure you discuss any questions you have with your health care provider. Document Released: 10/20/2010 Document Revised: 09/12/2015 Document Reviewed: 01/08/2015 Elsevier Interactive Patient Education  2018 [employer].  Obesity, Adult Obesity is the condition of having too much total body fat. Being overweight or obese means that your weight is greater than what is considered healthy  for your body size. Obesity is determined by a measurement called BMI. BMI is an estimate of body fat and is calculated from height and weight. For adults, a BMI of 30 or higher is considered obese. Obesity can eventually lead to other health concerns and major illnesses,  including:  Stroke.  Coronary artery disease (CAD).  Type 2 diabetes.  Some types of cancer, including cancers of the colon, breast, uterus, and gallbladder.  Osteoarthritis.  High blood pressure (hypertension).  High cholesterol.  Sleep apnea.  Gallbladder stones.  Infertility problems.  What are the causes? The main cause of obesity is taking in (consuming) more calories than your body uses for energy. Other factors that contribute to this condition may include:  Being born with genes that make you more likely to become obese.  Having a medical condition that causes obesity. These conditions include: ? Hypothyroidism. ? Polycystic ovarian syndrome (PCOS). ? Binge-eating disorder. ? Cushing syndrome.  Taking certain medicines, such as steroids, antidepressants, and seizure medicines.  Not being physically active (sedentary lifestyle).  Living where there are limited places to exercise safely or buy healthy foods.  Not getting enough sleep.  What increases the risk? The following factors may increase your risk of this condition:  Having a family history of obesity.  Being a woman of African-American descent.  Being a man of Hispanic descent.  What are the signs or symptoms? Having excessive body fat is the main symptom of this condition. How is this diagnosed? This condition may be diagnosed based on:  Your symptoms.  Your medical history.  A physical exam. Your health care provider may measure: ? Your BMI. If you are an adult with a BMI between 25 and less than 30, you are considered overweight. If you are an adult with a BMI of 30 or higher, you are considered obese. ? The distances around your hips and your waist (circumferences). These may be compared to each other to help diagnose your condition. ? Your skinfold thickness. Your health care provider may gently pinch a fold of your skin and measure it.  How is this treated? Treatment for this  condition often includes changing your lifestyle. Treatment may include some or all of the following:  Dietary changes. Work with your health care provider and a dietitian to set a weight-loss goal that is healthy and reasonable for you. Dietary changes may include eating: ? Smaller portions. A portion size is the amount of a particular food that is healthy for you to eat at one time. This varies from person to person. ? Low-calorie or low-fat options. ? More whole grains, fruits, and vegetables.  Regular physical activity. This may include aerobic activity (cardio) and strength training.  Medicine to help you lose weight. Your health care provider may prescribe medicine if you are unable to lose 1 pound a week after 6 weeks of eating more healthily and doing more physical activity.  Surgery. Surgical options may include gastric banding and gastric bypass. Surgery may be done if: ? Other treatments have not helped to improve your condition. ? You have a BMI of 40 or higher. ? You have life-threatening health problems related to obesity.  Follow these instructions at home:  Eating and drinking   Follow recommendations from your health care provider about what you eat and drink. Your health care provider may advise you to: ? Limit fast foods, sweets, and processed snack foods. ? Choose low-fat options, such as low-fat milk instead of whole milk. ?  Eat 5 or more servings of fruits or vegetables every day. ? Eat at home more often. This gives you more control over what you eat. ? Choose healthy foods when you eat out. ? Learn what a healthy portion size is. ? Keep low-fat snacks on hand. ? Avoid sugary drinks, such as soda, fruit juice, iced tea sweetened with sugar, and flavored milk. ? Eat a healthy breakfast.  Drink enough water to keep your urine clear or pale yellow.  Do not go without eating for long periods of time (do not fast) or follow a fad diet. Fasting and fad diets can be  unhealthy and even dangerous. Physical Activity  Exercise regularly, as told by your health care provider. Ask your health care provider what types of exercise are safe for you and how often you should exercise.  Warm up and stretch before being active.  Cool down and stretch after being active.  Rest between periods of activity. Lifestyle  Limit the time that you spend in front of your TV, computer, or video game system.  Find ways to reward yourself that do not involve food.  Limit alcohol intake to no more than 1 drink a day for nonpregnant women and 2 drinks a day for men. One drink equals 12 oz of beer, 5 oz of wine, or 1 oz of hard liquor. General instructions  Keep a weight loss journal to keep track of the food you eat and how much you exercise you get.  Take over-the-counter and prescription medicines only as told by your health care provider.  Take vitamins and supplements only as told by your health care provider.  Consider joining a support group. Your health care provider may be able to recommend a support group.  Keep all follow-up visits as told by your health care provider. This is important. Contact a health care provider if:  You are unable to meet your weight loss goal after 6 weeks of dietary and lifestyle changes. This information is not intended to replace advice given to you by your health care provider. Make sure you discuss any questions you have with your health care provider. Document Released: 05/14/2004 Document Revised: 09/09/2015 Document Reviewed: 01/23/2015 Elsevier Interactive Patient Education  2018 [employer].

## 2017-05-05 ENCOUNTER — Encounter: Payer: Self-pay | Admitting: Family Medicine

## 2017-05-05 DIAGNOSIS — Z0000 Encounter for general adult medical examination without abnormal findings: Secondary | ICD-10-CM | POA: Insufficient documentation

## 2017-05-05 NOTE — Assessment & Plan Note (Signed)
USPSTF grade A and B recommendations reviewed with patient; age-appropriate recommendations, preventive care, screening tests, etc discussed and encouraged; healthy living encouraged; see AVS for patient education given to patient  

## 2017-05-06 ENCOUNTER — Other Ambulatory Visit: Payer: Self-pay | Admitting: Family Medicine

## 2017-05-14 ENCOUNTER — Ambulatory Visit (INDEPENDENT_AMBULATORY_CARE_PROVIDER_SITE_OTHER): Admitting: Family Medicine

## 2017-05-14 ENCOUNTER — Encounter: Payer: Self-pay | Admitting: Family Medicine

## 2017-05-14 ENCOUNTER — Ambulatory Visit: Admitting: Family Medicine

## 2017-05-14 VITALS — BP 122/68 | HR 100 | Temp 98.3°F | Resp 18 | Ht 63.0 in | Wt 224.0 lb

## 2017-05-14 DIAGNOSIS — F330 Major depressive disorder, recurrent, mild: Secondary | ICD-10-CM | POA: Diagnosis not present

## 2017-05-14 MED ORDER — VORTIOXETINE HBR 20 MG PO TABS: 20.0000 mg | ORAL_TABLET | Freq: Every day | ORAL | 0 refills | Status: DC

## 2017-05-14 NOTE — Patient Instructions (Signed)
12 Ways to Curb Anxiety  ?Anxiety is normal human sensation. It is what helped our ancestors survive the pitfalls of the wilderness. Anxiety is defined as experiencing worry or nervousness about an imminent event or something with an uncertain outcome. It is a feeling experienced by most people at some point in their lives. Anxiety can be triggered by a very personal issue, such as the illness of a loved one, or an event of global proportions, such as a refugee crisis. Some of the symptoms of anxiety are:  Feeling restless.  Having a feeling of impending danger.  Increased heart rate.  Rapid breathing. Sweating.  Shaking.  Weakness or feeling tired.  Difficulty concentrating on anything except the current worry.  Insomnia.  Stomach or bowel problems. What can we do about anxiety we may be feeling? There are many techniques to help manage stress and relax. Here are 12 ways you can reduce your anxiety almost immediately: 1. Turn off the constant feed of information. Take a social media sabbatical. Studies have shown that social media directly contributes to social anxiety.  2. Monitor your television viewing habits. Are you watching shows that are also contributing to your anxiety, such as 24-hour news stations? Try watching something else, or better yet, nothing at all. Instead, listen to music, read an inspirational book or practice a hobby. 3. Eat nutritious meals. Also, don't skip meals and keep healthful snacks on hand. Hunger and poor diet contributes to feeling anxious. 4. Sleep. Sleeping on a regular schedule for at least seven to eight hours a night will do wonders for your outlook when you are awake. 5. Exercise. Regular exercise will help rid your body of that anxious energy and help you get more restful sleep. 6. Try deep (diaphragmatic) breathing. Inhale slowly through your nose for five seconds and exhale through your mouth. 7. Practice acceptance and gratitude. When anxiety hits,  accept that there are things out of your control that shouldn't be of immediate concern.  8. Seek out humor. When anxiety strikes, watch a funny video, read jokes or call a friend who makes you laugh. Laughter is healing for our bodies and releases endorphins that are calming. 9. Stay positive. Take the effort to replace negative thoughts with positive ones. Try to see a stressful situation in a positive light. Try to come up with solutions rather than dwelling on the problem. 10. Figure out what triggers your anxiety. Keep a journal and make note of anxious moments and the events surrounding them. This will help you identify triggers you can avoid or even eliminate. 11. Talk to someone. Let a trusted friend, family member or even trained professional know that you are feeling overwhelmed and anxious. Verbalize what you are feeling and why.  12. Volunteer. If your anxiety is triggered by a crisis on a large scale, become an advocate and work to resolve the problem that is causing you unease. Anxiety is often unwelcome and can become overwhelming. If not kept in check, it can become a disorder that could require medical treatment. However, if you take the time to care for yourself and avoid the triggers that make you anxious, you will be able to find moments of relaxation and clarity that make your life much more enjoyable.   

## 2017-05-14 NOTE — Progress Notes (Addendum)
Name: Olivia King   MRN: [id number]    DOB: [birth year]   Date:05/14/2017       Progress Note  Subjective  Chief Complaint  Chief Complaint  Patient presents with  . Depression    follow up, medication Trintellix not working    HPI  PT presents to follow up on her depression.  She is new to me today - has been under the care of her PCP, Dr. Sanda Klein for depression care.  Was seen 04/09/2017 - had d/c'd Paxil, was started on 10mg  Trintellix.  Mood has been about the same since starting Trintellix and stopping Paxil - no noticeable difference. Some crying spells here and there.  Denies SI/HI or hallucinations, no panic attacks. Still has healthy appetite; has lack of motivation in the mornings.  She notes chronic migraines, but denies worsening of headaches since starting Trintellix; no nausea, chest pain, palpitations, or shortness of breath.  She has ongoing anxiety and night-time awakenings. Tried taking her medications in the morning, but she felt drowsy throughout the day - has been taking medication at night, but feels that she needs to increase the dose.  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 Finances are good.  Patient Active Problem List   Diagnosis Date Noted  . Preventative health care 05/05/2017  . Anxiety 03/24/2017  . IUD contraception 02/06/2016  . Contraception management 01/06/2016  . Glucosuria 09/17/2015  . Major depressive disorder, recurrent ([place]) 08/19/2015  . Benign neoplasm of skin of nose 07/28/2015  . Encounter for smoking cessation counseling 05/13/2015  . Low serum vitamin D 12/24/2014  . Cigarette smoker 12/21/2014  . Obesity, Class II, BMI 35-39.9 12/21/2014  . Migraine with aura 12/21/2014  . Other fatigue 12/21/2014    Social History   Tobacco Use  . Smoking status: Current Every Day Smoker  . Smokeless  tobacco: Never Used  Substance Use Topics  . Alcohol use: Yes    Alcohol/week: 0.0 oz     Current Outpatient Medications:  .  cholecalciferol (VITAMIN D) 1000 units tablet, Take 1,000 Units by mouth daily., Disp: , Rfl:  .  doxycycline (VIBRAMYCIN) 100 MG capsule, Take 100 mg by mouth 2 (two) times daily., Disp: , Rfl: 5 .  Multiple Vitamin (MULTI-VITAMINS) TABS, Take by mouth., Disp: , Rfl:  .  TRINTELLIX 10 MG TABS, TAKE 1 TABLET BY MOUTH DAILY, Disp: 30 tablet, Rfl: 5 .  furosemide (LASIX) 20 MG tablet, as needed., Disp: , Rfl:   Allergies  Allergen Reactions  . Penicillins Rash    ROS  Constitutional: Negative for fever or weight change.  Respiratory: Negative for cough and shortness of breath.   Cardiovascular: Negative for chest pain or palpitations.  Gastrointestinal: Negative for abdominal pain, no bowel changes.  Musculoskeletal: Negative for gait problem or joint swelling.  Skin: Negative for rash.  Neurological: Negative for dizziness.  Psych: See HPI No other specific complaints in a complete review of systems (except as listed in HPI above).  Objective  Vitals:   05/14/17 1354  BP: 122/68  Pulse: 100  Resp: 18  Temp: 98.3 F (36.8 C)  TempSrc: Oral  SpO2: 97%  Weight: 224 lb (101.6 kg)  Height: 5\' 3"  (1.6 m)   Body mass index is 39.68 kg/m.  Nursing Note and Vital Signs reviewed.  Physical Exam Constitutional: Patient appears well-developed and  well-nourished. Obese. No distress.  HEENT: head atraumatic, normocephalic Cardiovascular: Normal rate, regular rhythm, S1/S2 present.  No murmur or rub heard. No BLE edema. Pulmonary/Chest: Effort normal and breath sounds clear. No respiratory distress or retractions. Psychiatric: Patient has a normal mood and affect. behavior is normal. Judgment and thought content normal.  No results found for this or any previous visit (from the past 72 hour(s)).  Assessment & Plan  1. Mild episode of recurrent  major depressive disorder (HCC) - We will increase to 20mg  daily - may continue to take at night. - vortioxetine HBr (TRINTELLIX) 20 MG TABS; Take 20 mg by mouth daily.  Dispense: 30 tablet; Refill: 0 - Return for 3-4 Week Depression f/u with Dr. Sanda Klein.  -Red flags and when to present for emergency care or RTC including fever >101.81F, chest pain, shortness of breath, new/worsening/un-resolving symptoms, SI/HI reviewed with patient at time of visit. Follow up and care instructions discussed and provided in AVS.

## 2017-06-14 ENCOUNTER — Ambulatory Visit (INDEPENDENT_AMBULATORY_CARE_PROVIDER_SITE_OTHER): Admitting: Family Medicine

## 2017-06-14 ENCOUNTER — Encounter: Payer: Self-pay | Admitting: Family Medicine

## 2017-06-14 DIAGNOSIS — F330 Major depressive disorder, recurrent, mild: Secondary | ICD-10-CM | POA: Diagnosis not present

## 2017-06-14 MED ORDER — VORTIOXETINE HBR 20 MG PO TABS: 20.0000 mg | ORAL_TABLET | Freq: Every day | ORAL | 5 refills | Status: DC

## 2017-06-14 NOTE — Patient Instructions (Addendum)
Avoid fast food like the plague Try to limit sodium to no more than 1500 mg per day Avoid sweet tea Check out the information at familydoctor.org entitled "Nutrition for Weight Loss: What You Need to Know about Fad Diets" Try to lose between 1-2 pounds per week by taking in fewer calories and burning off more calories You can succeed by limiting portions, limiting foods dense in calories and fat, becoming more active, and drinking 8 glasses of water a day (64 ounces) Don't skip meals, especially breakfast, as skipping meals may alter your metabolism Do not use over-the-counter weight loss pills or gimmicks that claim rapid weight loss A healthy BMI (or body mass index) is between 18.5 and 24.9 You can calculate your ideal BMI at the [place] website ClubMonetize.fr Use free calorie counter like MyFitnessPal Net loss every day of 500 calories to lose one pound per week We'll refer you to the bariatric specialist

## 2017-06-14 NOTE — Progress Notes (Signed)
BP 123/72 (BP Location: Right Arm, Patient Position: Sitting, Cuff Size: Large)   Pulse 98   Temp 99.2 F (37.3 C) (Oral)   Wt 235 lb 9.6 oz (106.9 kg)   LMP 05/31/2017   SpO2 99%   BMI 41.73 kg/m    Subjective:    Patient ID: Olivia King, female    DOB: October 29, 1985, 32 y.o.   MRN: [id number]  HPI: Olivia King is a 32 y.o. female  Chief Complaint  Patient presents with  . Medication Management    PT states that she has had swelling in hands and feet since starting medication     HPI Patient is here for f/u of depression She likes the medicine (trintellix), helping mood; no SI/HI; sleeping well However, having swelling in hands and feet She does add salt when she cooks, depending on what it is Does not check nutritional information on labels or going out to eat No soft drinks  Morbid obesity She has gained 11 pounds since last visit She does not know how many calories a day she is eating Started walking on Friday or Saturday(just two or three days ago); walks 1.5 miles at a time; family time Drinking lots of water She is craving sweets and gives in to the cravings Not interested in seeing counselor She has thought about bariatric surgery She has taken OTC diet pills in the past  Depression screen [facility] 2/9 06/14/2017 06/14/2017 04/30/2017 04/09/2017 03/19/2017  Decreased Interest 0 0 0 1 0  Down, Depressed, Hopeless 0 0 0 0 0  PHQ - 2 Score 0 0 0 1 0  Altered sleeping - - - - -  Tired, decreased energy - - - - -  Change in appetite - - - - -  Feeling bad or failure about yourself  - - - - -  Trouble concentrating - - - - -  Moving slowly or fidgety/restless - - - - -  Suicidal thoughts - - - - -  PHQ-9 Score - - - - -  Difficult doing work/chores - - - - -    Relevant past medical, surgical, family and social history reviewed Past Medical History:  Diagnosis Date  . Cigarette smoker   . IUD contraception 02/06/2016   Placed January 22, 101 by Ardeth Perfect, [place]  . Major depressive disorder, recurrent ([place]) 08/19/2015   zoloft made her cry; cymbalta made her too sleepy  . Migraine headache    Past Surgical History:  Procedure Laterality Date  . contraceptive implant     Family History  Problem Relation Age of Onset  . Hypertension Mother   . Cancer Mother        pancreatic  . Pancreatic cancer Mother   . Hypertension Brother   . Aneurysm Maternal Grandmother   . Heart attack Maternal Grandfather   . Hypertension Paternal Grandmother   . Heart failure Sister    Social History   Tobacco Use  . Smoking status: Current Every Day Smoker  . Smokeless tobacco: Never Used  Substance Use Topics  . Alcohol use: Yes    Alcohol/week: 0.0 oz  . Drug use: No    Interim medical history since last visit reviewed. Allergies and medications reviewed  Review of Systems Per HPI unless specifically indicated above     Objective:    BP 123/72 (BP Location: Right Arm, Patient Position: Sitting, Cuff Size: Large)   Pulse 98   Temp 99.2 F (37.3 C) (Oral)  Wt 235 lb 9.6 oz (106.9 kg)   LMP 05/31/2017   SpO2 99%   BMI 41.73 kg/m   Wt Readings from Last 3 Encounters:  06/14/17 235 lb 9.6 oz (106.9 kg)  05/14/17 224 lb (101.6 kg)  04/30/17 227 lb 4.8 oz (103.1 kg)    Physical Exam  Constitutional: She appears well-developed and well-nourished.  Morbidly obese  HENT:  Mouth/Throat: Mucous membranes are normal.  Eyes: EOM are normal. No scleral icterus.  Cardiovascular: Normal rate and regular rhythm.  Pulmonary/Chest: Effort normal and breath sounds normal.  Psychiatric: She has a normal mood and affect. Her mood appears not anxious. She does not exhibit a depressed mood. She expresses no homicidal and no suicidal ideation.       Assessment & Plan:   Problem List Items Addressed This Visit      Other   Morbid obesity ([place])    Motivational interviewing, encouragement given; refer to bariatric surgeon; use  calorie counter; patient has only walked two days and is not limiting her diet; there is really no pill that I can prescribe that will help if she doesn't exercise or eat right; encouraged discipline; limiting calories by even just 250 calories will help her lose 26 pounds a year; stop sweet tea, drinking empty calories; if she puts in a real effort and needs to consider something (belviq, e.g.), she may call me in two weeks with progress of her efforts and we can discuss      Relevant Orders   Amb Referral to Bariatric Surgery   Major depressive disorder, recurrent ([place])    Patient will continue the current medicine since it is working so well; to counter the swelling in her hands and feet, I encouraged her to really limit the sodium in her diet; for example, we reviewed sodium content in sandwiches from a fast food restaurant, already over the daily limit with one sandwich only      Relevant Medications   vortioxetine HBr (TRINTELLIX) 20 MG TABS tablet       Follow up plan: No Follow-up on file.  An after-visit summary was printed and given to the patient at [facility].  Please see the patient instructions which may contain other information and recommendations beyond what is mentioned above in the assessment and plan.  Meds ordered this encounter  Medications  . vortioxetine HBr (TRINTELLIX) 20 MG TABS tablet    Sig: Take 1 tablet (20 mg total) by mouth daily.    Dispense:  30 tablet    Refill:  5    Orders Placed This Encounter  Procedures  . Amb Referral to Bariatric Surgery

## 2017-06-14 NOTE — Assessment & Plan Note (Signed)
Patient will continue the current medicine since it is working so well; to counter the swelling in her hands and feet, I encouraged her to really limit the sodium in her diet; for example, we reviewed sodium content in sandwiches from a fast food restaurant, already over the daily limit with one sandwich only

## 2017-06-14 NOTE — Assessment & Plan Note (Addendum)
Motivational interviewing, encouragement given; refer to bariatric surgeon; use calorie counter; patient has only walked two days and is not limiting her diet; there is really no pill that I can prescribe that will help if she doesn't exercise or eat right; encouraged discipline; limiting calories by even just 250 calories will help her lose 26 pounds a year; stop sweet tea, drinking empty calories; if she puts in a real effort and needs to consider something (belviq, e.g.), she may call me in two weeks with progress of her efforts and we can discuss

## 2017-09-09 ENCOUNTER — Telehealth: Payer: Self-pay | Admitting: Family Medicine

## 2017-09-09 NOTE — Telephone Encounter (Signed)
Copied from [place] [id number]. Topic: Quick Communication - See Telephone Encounter >> Sep 09, 2017 11:06 AM Neva Seat wrote: Pt needing shot records and also needs to know if she received her Hep B shot. Please call pt to let her know when she can pick the records up.

## 2017-09-14 NOTE — Telephone Encounter (Signed)
Pt notified printed off what we had

## 2017-11-30 ENCOUNTER — Encounter: Payer: Self-pay | Admitting: Family Medicine

## 2017-11-30 ENCOUNTER — Ambulatory Visit
Admission: RE | Admit: 2017-11-30 | Discharge: 2017-11-30 | Disposition: A | Discharge status: Home / Self Care | Source: Ambulatory Visit | Attending: Family Medicine | Admitting: Family Medicine

## 2017-11-30 ENCOUNTER — Ambulatory Visit (INDEPENDENT_AMBULATORY_CARE_PROVIDER_SITE_OTHER): Admitting: Family Medicine

## 2017-11-30 VITALS — BP 112/74 | HR 102 | Temp 99.0°F | Ht 63.0 in | Wt 239.7 lb

## 2017-11-30 DIAGNOSIS — G8929 Other chronic pain: Secondary | ICD-10-CM | POA: Insufficient documentation

## 2017-11-30 DIAGNOSIS — M79671 Pain in right foot: Secondary | ICD-10-CM | POA: Insufficient documentation

## 2017-11-30 DIAGNOSIS — M7731 Calcaneal spur, right foot: Secondary | ICD-10-CM | POA: Insufficient documentation

## 2017-11-30 MED ORDER — MELOXICAM 7.5 MG PO TABS: 7.5000 mg | ORAL_TABLET | Freq: Every day | ORAL | 0 refills | Status: DC | PRN

## 2017-11-30 NOTE — Progress Notes (Signed)
BP 112/74   Pulse (!) 102   Temp 99 F (37.2 C)   Ht 5\' 3"  (1.6 m)   Wt 239 lb 11.2 oz (108.7 kg)   SpO2 96%   BMI 42.46 kg/m    Subjective:    Patient ID: Olivia King, female    DOB: [birth year], 32 y.o.   MRN: [id number]  HPI: Olivia King is a 32 y.o. female  Chief Complaint  Patient presents with  . Foot Pain    bilateral, right worse than left.    HPI Patient is here for an acute visit for bilateral foot pain One month duration Started in right, but now compensating and involving the left Heel area No redness Some swelling around the heel Wore crocs and on her feet a lot Now taking break from school but working and busy with kids and football and still hurting Hx of ankle sprain on maybe right foot She has tried tylenol which helps some Ibuprofen does not work at all Father gave her CBD oil, helps some but doesn't take it away Wearing soft sneakers, puma right now Wears flip flops  Depression screen [facility] 2/9 11/30/2017 06/14/2017 06/14/2017 04/30/2017 04/09/2017  Decreased Interest 0 0 0 0 1  Down, Depressed, Hopeless 0 0 0 0 0  PHQ - 2 Score 0 0 0 0 1  Altered sleeping 0 - - - -  Tired, decreased energy 3 - - - -  Change in appetite 0 - - - -  Feeling bad or failure about yourself  0 - - - -  Trouble concentrating 0 - - - -  Moving slowly or fidgety/restless 0 - - - -  Suicidal thoughts 0 - - - -  PHQ-9 Score 3 - - - -  Difficult doing work/chores Not difficult at all - - - -    Relevant past medical, surgical, family and social history reviewed Past Medical History:  Diagnosis Date  . Cigarette smoker   . IUD contraception 02/06/2016   Placed January 22, 5026 by Ardeth Perfect, [place]  . Major depressive disorder, recurrent ([place]) 08/19/2015   zoloft made her cry; cymbalta made her too sleepy  . Migraine headache    Past Surgical History:  Procedure Laterality Date  . contraceptive implant     Family History  Problem Relation  Age of Onset  . Hypertension Mother   . Cancer Mother        pancreatic  . Pancreatic cancer Mother   . Hypertension Brother   . Aneurysm Maternal Grandmother   . Heart attack Maternal Grandfather   . Hypertension Paternal Grandmother   . Heart failure Sister   MD note: sister died at age 58 from heart failure  Social History   Tobacco Use  . Smoking status: Current Every Day Smoker  . Smokeless tobacco: Never Used  Substance Use Topics  . Alcohol use: Yes    Alcohol/week: 0.0 standard drinks  . Drug use: No    Interim medical history since last visit reviewed. Allergies and medications reviewed  Review of Systems Per HPI unless specifically indicated above     Objective:    BP 112/74   Pulse (!) 102   Temp 99 F (37.2 C)   Ht 5\' 3"  (1.6 m)   Wt 239 lb 11.2 oz (108.7 kg)   SpO2 96%   BMI 42.46 kg/m   Wt Readings from Last 3 Encounters:  11/30/17 239 lb 11.2 oz (108.7 kg)  06/14/17 235 lb 9.6 oz (106.9 kg)  05/14/17 224 lb (101.6 kg)    Physical Exam  Constitutional: She appears well-developed and well-nourished.  Morbidly obese  HENT:  Mouth/Throat: Mucous membranes are normal.  Eyes: EOM are normal. No scleral icterus.  Cardiovascular: Normal rate and regular rhythm.  Pulmonary/Chest: Effort normal and breath sounds normal.  Musculoskeletal:       Right foot: There is tenderness (under the RIGHT heel; no deformity). There is normal range of motion, no swelling and no deformity.       Left foot: There is normal range of motion, no tenderness, no swelling and no deformity.  Psychiatric: She has a normal mood and affect. Her behavior is normal.      Assessment & Plan:   Problem List Items Addressed This Visit      Other   Morbid obesity ([place])    Encouraged weight loss, citing the weight coming down on the heel with every step       Other Visit Diagnoses    Heel pain, chronic, right    -  Primary   patient declined NSAID given risk of GI bleed;  refer to podiatry; xray ordered   Relevant Orders   DG Foot Complete Right (Completed)   Ambulatory referral to Podiatry      Follow up plan: No follow-ups on file.  An after-visit summary was printed and given to the patient at [facility].  Please see the patient instructions which may contain other information and recommendations beyond what is mentioned above in the assessment and plan.  Meds ordered this encounter  Medications  . DISCONTD: meloxicam (MOBIC) 7.5 MG tablet    Sig: Take 1 tablet (7.5 mg total) by mouth daily as needed for pain.    Dispense:  21 tablet    Refill:  0    Orders Placed This Encounter  Procedures  . DG Foot Complete Right  . Ambulatory referral to Podiatry

## 2017-11-30 NOTE — Patient Instructions (Addendum)
Please have xrays done across the street Ice is nice, 3-4 x a day, always have protective cloth between ice and skin; 15-20 minutes at a time Okay to use tylenol in addition per package instructions Be aware that the non-steroidals have a risk of stomach bleeding Keep trying to lose weight Check out the information at familydoctor.org entitled "Nutrition for Weight Loss: What You Need to Know about Fad Diets" Try to lose between 1-2 pounds per week by taking in fewer calories and burning off more calories You can succeed by limiting portions, limiting foods dense in calories and fat, becoming more active, and drinking 8 glasses of water a day (64 ounces) Don't skip meals, especially breakfast, as skipping meals may alter your metabolism Do not use over-the-counter weight loss pills or gimmicks that claim rapid weight loss A healthy BMI (or body mass index) is between 18.5 and 24.9 You can calculate your ideal BMI at the [place] website ClubMonetize.fr Call me with an update in 2-3 weeks and let me know how you're feeling We can refer to a podiatrist if needed

## 2017-12-12 NOTE — Assessment & Plan Note (Signed)
Encouraged weight loss, citing the weight coming down on the heel with every step

## 2017-12-14 ENCOUNTER — Ambulatory Visit (INDEPENDENT_AMBULATORY_CARE_PROVIDER_SITE_OTHER): Admitting: Podiatry

## 2017-12-14 ENCOUNTER — Encounter: Payer: Self-pay | Admitting: Podiatry

## 2017-12-14 DIAGNOSIS — M722 Plantar fascial fibromatosis: Secondary | ICD-10-CM | POA: Diagnosis not present

## 2017-12-14 MED ORDER — MELOXICAM 15 MG PO TABS: 15.0000 mg | ORAL_TABLET | Freq: Every day | ORAL | 1 refills | Status: DC

## 2017-12-16 NOTE — Progress Notes (Signed)
   Subjective: 32 year old female presenting today as a new patient with a chief complaint of pain to the plantar and medial right heel that began suddenly 1-2 months ago. She states she had X-Rays the at showed a bone spur and was prescribed Meloxicam but she never took it. There are no modifying factors noted. She has not done anything for treatment. Patient is here for further evaluation and treatment.   Past Medical History:  Diagnosis Date  . Cigarette smoker   . IUD contraception 02/06/2016   Placed January 23, 6567 by Ardeth Perfect, [place]  . Major depressive disorder, recurrent ([place]) 08/19/2015   zoloft made her cry; cymbalta made her too sleepy  . Migraine headache      Objective: Physical Exam General: The patient is alert and oriented x3 in no acute distress.  Dermatology: Skin is warm, dry and supple bilateral lower extremities. Negative for open lesions or macerations bilateral.   Vascular: Dorsalis Pedis and Posterior Tibial pulses palpable bilateral.  Capillary fill time is immediate to all digits.  Neurological: Epicritic and protective threshold intact bilateral.   Musculoskeletal: Tenderness to palpation to the plantar aspect of the right heel along the plantar fascia. All other joints range of motion within normal limits bilateral. Strength 5/5 in all groups bilateral.   Radiographic exam: Normal osseous mineralization. Joint spaces preserved. No fracture/dislocation/boney destruction. No other soft tissue abnormalities or radiopaque foreign bodies.   Assessment: 1. Plantar fasciitis right - posterior  2. Pain in right foot  Plan of Care:  1. Patient evaluated. Xrays reviewed.   2. Injection of 0.5cc Celestone soluspan injected into the right plantar fascia  3. Rx for Meloxicam provided to patient.  4. Silicone heel sleeve dispensed.  5. Instructed patient regarding therapies and modalities at home to alleviate symptoms.  6. Return to clinic in 4  weeks.    Going to [employer] at [employer].    Edrick Kins, DPM [facility]  Dr. Edrick Kins, DPM    [address], [place] [address]                Office [phone]  Fax [phone]

## 2018-01-11 ENCOUNTER — Encounter: Admitting: Podiatry

## 2018-01-16 NOTE — Progress Notes (Signed)
This encounter was created in error - please disregard.

## 2018-01-21 ENCOUNTER — Telehealth: Payer: Self-pay

## 2018-01-21 ENCOUNTER — Ambulatory Visit

## 2018-01-21 NOTE — Telephone Encounter (Signed)
Copied from [place] [id number]. Topic: Quick Communication - See Telephone Encounter >> Jan 20, 2018  4:35 PM Antonieta Iba C wrote: CRM for notification. See Telephone encounter for: 01/20/18.  Pt called in to request orders placed. Pt says that she was seen by bariatric clinic today and was advised to have A1C checked due to her glucose level being high. Pt says that she also need a urinalysis for school.

## 2018-01-21 NOTE — Telephone Encounter (Signed)
Tried contacting pt at 2:46 to inform pt the bring the school form, labs, and note but was not able to leave message.

## 2018-01-21 NOTE — Telephone Encounter (Signed)
Copied from [place] [id number]. Topic: Quick Communication - See Telephone Encounter >> Jan 20, 2018  4:38 PM Antonieta Iba C wrote: CRM for notification. See Telephone encounter for: 01/20/18.   Pt would like to switch providers from Dr. Sanda Klein to Dr. Ancil Boozer if possible. Pt has seen Dr. Ancil Boozer before and says that she feels that provider would be a better fit for her  Please advise.

## 2018-01-21 NOTE — Telephone Encounter (Signed)
Appointment please, and make sure we have school form and labs and note from bariatric surgeon

## 2018-01-21 NOTE — Telephone Encounter (Signed)
Copied from [place] [phone]. Topic: Quick Communication - See Telephone Encounter >> Jan 20, 2018  4:38 PM Antonieta Iba C wrote: CRM for notification. See Telephone encounter for: 01/20/18.   Pt would like to switch providers from Dr. Sanda Klein to Dr. Ancil Boozer if possible. Pt has seen Dr. Ancil Boozer before and says that she feels that provider would be a better fit for her  Please advise.

## 2018-01-24 NOTE — Telephone Encounter (Signed)
Dr Cordelia Poche to see patient. Appt has been scheduled.

## 2018-02-03 ENCOUNTER — Ambulatory Visit: Admitting: Family Medicine

## 2018-03-04 ENCOUNTER — Ambulatory Visit (INDEPENDENT_AMBULATORY_CARE_PROVIDER_SITE_OTHER): Admitting: Family Medicine

## 2018-03-04 ENCOUNTER — Encounter: Payer: Self-pay | Admitting: Family Medicine

## 2018-03-04 DIAGNOSIS — F17210 Nicotine dependence, cigarettes, uncomplicated: Secondary | ICD-10-CM

## 2018-03-04 DIAGNOSIS — D72829 Elevated white blood cell count, unspecified: Secondary | ICD-10-CM | POA: Diagnosis not present

## 2018-03-04 DIAGNOSIS — F419 Anxiety disorder, unspecified: Secondary | ICD-10-CM | POA: Diagnosis not present

## 2018-03-04 DIAGNOSIS — Z020 Encounter for examination for admission to educational institution: Secondary | ICD-10-CM

## 2018-03-04 DIAGNOSIS — Z23 Encounter for immunization: Secondary | ICD-10-CM | POA: Diagnosis not present

## 2018-03-04 DIAGNOSIS — Z131 Encounter for screening for diabetes mellitus: Secondary | ICD-10-CM

## 2018-03-04 DIAGNOSIS — F330 Major depressive disorder, recurrent, mild: Secondary | ICD-10-CM

## 2018-03-04 DIAGNOSIS — Z111 Encounter for screening for respiratory tuberculosis: Secondary | ICD-10-CM

## 2018-03-04 DIAGNOSIS — F5101 Primary insomnia: Secondary | ICD-10-CM

## 2018-03-04 DIAGNOSIS — Z79899 Other long term (current) drug therapy: Secondary | ICD-10-CM

## 2018-03-04 MED ORDER — VORTIOXETINE HBR 20 MG PO TABS: 20.0000 mg | ORAL_TABLET | Freq: Every day | ORAL | 5 refills | Status: DC

## 2018-03-04 MED ORDER — HYDROXYZINE HCL 10 MG PO TABS: 10.0000 mg | ORAL_TABLET | Freq: Every evening | ORAL | 0 refills | Status: DC | PRN

## 2018-03-04 NOTE — Progress Notes (Signed)
Name: Olivia King   MRN: [id number]    DOB: 09/04/1985   Date:03/04/2018       Progress Note  Subjective  Chief Complaint  Chief Complaint  Patient presents with  . Paperwork  . Labs Only  . Immunizations    HPI  Morbid obesity: she has a long history of obesity since the birth of her first child, the weight was stable around 203lbs, but over the past year she started to gain more weight. She states she is a stress eater and is now going to school full time has a teenage boy and a daughter and uses cigarettes to calm her nerves. She is on phentermine and hcg injections and has lost about 20 lbs since started on regiment. Discussed long term medications and we will get labs and she will return to discuss options in about one month  Major Depression/ and anxiety with insomnia: she has a long history of depression, symptoms started when husband was deployed in June 21, 2007, but worse when he died while in [place], she failed Zoloft, Paxil but has been on Trintelix since 2017-06-20 she states it seems to help. No suicidal thoughts or ideation.   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 Morbid obesity ([place]) 06/14/2017  . Preventative health care 05/05/2017  . Anxiety 03/24/2017  . IUD contraception 02/06/2016  . Contraception management 01/06/2016  . Glucosuria 09/17/2015  . Major depressive disorder, recurrent ([place]) 08/19/2015  . Benign neoplasm of skin of nose 07/28/2015  . Encounter for smoking cessation counseling 05/13/2015  . Low serum vitamin D 12/24/2014  . Cigarette smoker 12/21/2014  . Migraine with aura 12/21/2014  . Other fatigue 12/21/2014    Past Surgical History:  Procedure Laterality  Date  . contraceptive implant      Family History  Problem Relation Age of Onset  . Hypertension Mother   . Cancer Mother        pancreatic  . Pancreatic cancer Mother   . Hypertension Brother   . Aneurysm Maternal Grandmother   . Heart attack Maternal Grandfather   . Hypertension Paternal Grandmother   . Heart failure Sister     Social History   Socioeconomic History  . Marital status: Widowed    Spouse name: Not on file  . Number of children: 2  . Years of education: Not on file  . Highest education level: Associate degree: occupational, Hotel manager, or vocational program  Occupational History  . Occupation: Scientist, research (medical)  . Occupation: Ship broker     Comment: [employer]  . Financial resource strain: Not hard at all  . Food insecurity:    Worry: Never true    Inability: Never true  . Transportation needs:    Medical: No    Non-medical: No  Tobacco Use  . Smoking status: Current Every Day Smoker    Packs/day: 0.30    Start date: 03/05/2011  . Smokeless tobacco: Never Used  Substance and Sexual Activity  . Alcohol use: Yes    Alcohol/week: 0.0 standard drinks  . Drug use: No  . Sexual activity: Not Currently    Partners: Male    Birth control/protection: IUD  Comment: Patient has liletta IUD  Lifestyle  . Physical activity:    Days per week: 3 days    Minutes per session: 30 min  . Stress: Rather much  Relationships  . Social connections:    Talks on phone: More than three times a week    Gets together: Twice a week    Attends religious service: More than 4 times per year    Active member of club or organization: Yes    Attends meetings of clubs or organizations: More than 4 times per year    Relationship status: Widowed  . Intimate partner violence:    Fear of current or ex partner: No    Emotionally abused: No    Physically abused: No    Forced sexual activity: No  Other Topics Concern  . Not on file  Social History Narrative   She is a widow, he  died June 19, 2009 while he was deployed.    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  Chorionic Gonadotropin (HCG IJ), Inject as directed., Disp: , Rfl:  .  Multiple Vitamin (MULTI-VITAMINS) TABS, Take by mouth., Disp: , Rfl:  .  phentermine 37.5 MG capsule, Take 37.5 mg by mouth every morning., Disp: , Rfl:  .  vortioxetine HBr (TRINTELLIX) 20 MG TABS tablet, Take 1 tablet (20 mg total) by mouth daily., Disp: 30 tablet, Rfl: 5 .  cholecalciferol (VITAMIN D) 1000 units tablet, Take 1,000 Units by mouth daily., Disp: , Rfl:  .  hydrOXYzine (ATARAX/VISTARIL) 10 MG tablet, Take 1 tablet (10 mg total) by mouth at bedtime as needed., Disp: 30 tablet, Rfl: 0  Allergies  Allergen Reactions  . Penicillins Rash    I personally reviewed active problem list, medication list, allergies, family history, social history with the patient/caregiver today.   ROS  Constitutional: Negative for fever, positive for  weight change.  Respiratory: Negative for cough and shortness of breath.   Cardiovascular: Negative for chest pain or palpitations.  Gastrointestinal: Negative for abdominal pain, no bowel changes.  Musculoskeletal: Negative for gait problem or joint swelling.  Skin: Negative for rash.  Neurological: Negative for dizziness or headache.  No other specific complaints in a complete review of systems (except as listed in HPI above).  Objective  Vitals:   03/04/18 1203  BP: 110/76  Pulse: (!) 107  Resp: 16  Temp: 99.5 F (37.5 C)  TempSrc: Oral  SpO2: 99%  Weight: 228 lb 11.2 oz (103.7 kg)  Height: 5' 3"  (1.6 m)    Body mass index is 40.51 kg/m.  Physical Exam  Constitutional: Patient appears well-developed and well-nourished. Obese  No distress.  HEENT: head atraumatic, normocephalic, pupils equal and reactive to light,  neck supple, throat within normal limits Cardiovascular: Normal rate,  regular rhythm and normal heart sounds.  No murmur heard. No BLE edema. Pulmonary/Chest: Effort normal and breath sounds normal. No respiratory distress. Abdominal: Soft.  There is no tenderness. Psychiatric: Patient has a normal mood and affect. behavior is normal. Judgment and thought content normal.  PHQ2/9: Depression screen [facility] 2/9 03/04/2018 11/30/2017 06/14/2017 06/14/2017 04/30/2017  Decreased Interest 0 0 0 0 0  Down, Depressed, Hopeless 0 0 0 0 0  PHQ - 2 Score 0 0 0 0 0  Altered sleeping 3 0 - - -  Tired, decreased energy 0 3 - - -  Change  in appetite 0 0 - - -  Feeling bad or failure about yourself  0 0 - - -  Trouble concentrating 0 0 - - -  Moving slowly or fidgety/restless 0 0 - - -  Suicidal thoughts 0 0 - - -  PHQ-9 Score 3 3 - - -  Difficult doing work/chores - Not difficult at all - - -     Fall Risk: Fall Risk  03/04/2018 11/30/2017 06/14/2017 06/14/2017 04/30/2017  Falls in the past year? 0 No No No No     Functional Status Survey: Is the patient deaf or have difficulty hearing?: No Does the patient have difficulty seeing, even when wearing glasses/contacts?: Yes(patient stated that she cannot see far off) Does the patient have difficulty concentrating, remembering, or making decisions?: No Does the patient have difficulty walking or climbing stairs?: No Does the patient have difficulty dressing or bathing?: No Does the patient have difficulty doing errands alone such as visiting a doctor's office or shopping?: No   GAD 7 : Generalized Anxiety Score 03/04/2018  Nervous, Anxious, on Edge 0  Control/stop worrying 0  Worry too much - different things 1  Trouble relaxing 0  Restless 1  Easily annoyed or irritable 1  Afraid - awful might happen 1  Total GAD 7 Score 4  Anxiety Difficulty Somewhat difficult    Assessment & Plan  1. Morbid obesity ([place])  - Hemoglobin A1c  2. Need for influenza vaccination  - Flu Vaccine QUAD 6+ mos PF IM (Fluarix Quad  PF)  3. Mild episode of recurrent major depressive disorder (H[facility])  - vortioxetine HBr (TRINTELLIX) 20 MG TABS tablet; Take 1 tablet (20 mg total) by mouth daily.  Dispense: 30 tablet; Refill: 5  4. Cigarette smoker  - Pneumococcal polysaccharide vaccine 23-valent greater than or equal to 2yo subcutaneous/IM  5. Leukocytosis, unspecified type  - CBC with Differential/Platelet  6. Diabetes mellitus screening  - Hemoglobin A1c  7. Long-term use of high-risk medication  - COMPLETE METABOLIC PANEL WITH GFR  8. School physical exam  - QuantiFERON-TB Gold Plus - Varicella zoster antibody, IgG  9. Screening-pulmonary TB  - QuantiFERON-TB Gold Plus  10. Need for vaccination for pneumococcus  - Pneumococcal polysaccharide vaccine 23-valent greater than or equal to 2yo subcutaneous/IM  11. Need for hepatitis A immunization  - Hepatitis A vaccine adult IM  12. Need for MMR vaccine  - MMR vaccine subcutaneous  13. Primary insomnia  We will try hydroxyzine   14. Anxiety  - hydrOXYzine (ATARAX/VISTARIL) 10 MG tablet; Take 1 tablet (10 mg total) by mouth at bedtime as needed.  Dispense: 30 tablet; Refill: 0

## 2018-03-05 LAB — URINALYSIS, MICROSCOPIC ONLY
BACTERIA UA: NONE SEEN /HPF
Hyaline Cast: NONE SEEN /LPF
RBC / HPF: NONE SEEN /HPF (ref 0–2)
Squamous Epithelial / LPF: NONE SEEN /HPF (ref <=5)
WBC UA: NONE SEEN /HPF (ref 0–5)

## 2018-03-07 LAB — COMPLETE METABOLIC PANEL WITH GFR
AG RATIO: 1.3 (calc) (ref 1.0–2.5)
ALKALINE PHOSPHATASE (APISO): 60 U/L (ref 33–115)
ALT: 17 U/L (ref 6–29)
AST: 21 U/L (ref 10–30)
Albumin: 4 g/dL (ref 3.6–5.1)
BUN: 7 mg/dL (ref 7–25)
CHLORIDE: 104 mmol/L (ref 98–110)
CO2: 29 mmol/L (ref 20–32)
Calcium: 9.1 mg/dL (ref 8.6–10.2)
Creat: 0.94 mg/dL (ref 0.50–1.10)
GFR, EST AFRICAN AMERICAN: 92 mL/min/{1.73_m2} (ref >=60)
GFR, Est Non African American: 80 mL/min/{1.73_m2} (ref >=60)
GLOBULIN: 3.2 g/dL (ref 1.9–3.7)
Glucose, Bld: 87 mg/dL (ref 65–139)
Potassium: 3.6 mmol/L (ref 3.5–5.3)
Sodium: 136 mmol/L (ref 135–146)
Total Bilirubin: 0.6 mg/dL (ref 0.2–1.2)
Total Protein: 7.2 g/dL (ref 6.1–8.1)

## 2018-03-07 LAB — CBC WITH DIFFERENTIAL/PLATELET
BASOS ABS: 124 {cells}/uL (ref 0–200)
Basophils Relative: 1.1 %
Eosinophils Absolute: 475 cells/uL (ref 15–500)
Eosinophils Relative: 4.2 %
HEMATOCRIT: 35.3 % (ref 35.0–45.0)
Hemoglobin: 12.1 g/dL (ref 11.7–15.5)
LYMPHS ABS: 2780 {cells}/uL (ref 850–3900)
MCH: 30.2 pg (ref 27.0–33.0)
MCHC: 34.3 g/dL (ref 32.0–36.0)
MCV: 88 fL (ref 80.0–100.0)
MPV: 10.3 fL (ref 7.5–12.5)
Monocytes Relative: 6.6 %
NEUTROS PCT: 63.5 %
Neutro Abs: 7176 cells/uL (ref 1500–7800)
Platelets: 347 10*3/uL (ref 140–400)
RBC: 4.01 10*6/uL (ref 3.80–5.10)
RDW: 12.4 % (ref 11.0–15.0)
Total Lymphocyte: 24.6 %
WBC: 11.3 10*3/uL — ABNORMAL HIGH (ref 3.8–10.8)
WBCMIX: 746 {cells}/uL (ref 200–950)

## 2018-03-07 LAB — QUANTIFERON-TB GOLD PLUS
NIL: 0.03 [IU]/mL
QuantiFERON-TB Gold Plus: NEGATIVE
TB1-NIL: 0.02 [IU]/mL
TB2-NIL: 0.03 IU/mL

## 2018-03-07 LAB — VARICELLA ZOSTER ANTIBODY, IGG: Varicella IgG: 427.5 index

## 2018-03-07 LAB — HEMOGLOBIN A1C
HEMOGLOBIN A1C: 5.2 %{Hb} (ref <5.7)
Mean Plasma Glucose: 103 (calc)
eAG (mmol/L): 5.7 (calc)

## 2018-04-05 ENCOUNTER — Encounter: Payer: Self-pay | Admitting: Family Medicine

## 2018-04-05 ENCOUNTER — Ambulatory Visit (INDEPENDENT_AMBULATORY_CARE_PROVIDER_SITE_OTHER): Admitting: Family Medicine

## 2018-04-05 DIAGNOSIS — F17210 Nicotine dependence, cigarettes, uncomplicated: Secondary | ICD-10-CM | POA: Diagnosis not present

## 2018-04-05 DIAGNOSIS — F419 Anxiety disorder, unspecified: Secondary | ICD-10-CM | POA: Diagnosis not present

## 2018-04-05 DIAGNOSIS — F330 Major depressive disorder, recurrent, mild: Secondary | ICD-10-CM

## 2018-04-05 DIAGNOSIS — D72828 Other elevated white blood cell count: Secondary | ICD-10-CM

## 2018-04-05 DIAGNOSIS — F5101 Primary insomnia: Secondary | ICD-10-CM

## 2018-04-05 MED ORDER — LIRAGLUTIDE -WEIGHT MANAGEMENT 18 MG/3ML ~~LOC~~ SOPN: 3.0000 mg | PEN_INJECTOR | Freq: Every day | SUBCUTANEOUS | 1 refills | Status: DC

## 2018-04-05 MED ORDER — HYDROXYZINE HCL 10 MG PO TABS: 10.0000 mg | ORAL_TABLET | Freq: Every evening | ORAL | 0 refills | Status: DC | PRN

## 2018-04-05 NOTE — Progress Notes (Signed)
Name: Olivia King   MRN: [id number]    DOB: [birth year]   Date:04/05/2018       Progress Note  Subjective  Chief Complaint  Chief Complaint  Patient presents with  . Follow-up    1 month F/U and labs today  . Depression  . Obesity    HPI  Morbid obesity: she has a long history of obesity since the birth of her first child at age 32, the weight was stable around 203lbs, but over the past year she started to gain more weight. She states she is a stress eater and is now going to school full time has a teenage boy that is going to start driving soon  and a daughter and uses cigarettes to calm her nerves. She was going to the bariatric center and was taking phentermine and hcg injections and had lost about 20 lbs from September till Nov, but since she stopped she gained 5 lbs back, she states medication worked in the beginning, also tried Building services engineer without success, she is here today to try something else, she states she does not want to go back there. She has failed weight watchers in the past. She has also failed intermittent fast. Diet: drinking coffee with sugar in am, lunch around 2:30 pm at fast food places, dinner at home - spaghetti, tacos and pizza. Explained her diet is not. She needs to change her diet and she needs to walk daily   Major Depression/ and anxiety with insomnia: she has a long history of depression, symptoms started when husband was deployed in 07-17-2007, but worse when he died while in [place], she failed Zoloft, Paxil but has been on Trintelix since 2017/07/16 she states it seems to help. No suicidal thoughts or ideation. Phq9 is stable  Leucocytosis: previous history and also had elevation of white count during a recent lab done at bariatric center, she is a smoker , level stable, she is trying to quit smoking, cutting down on cigarettes     Patient Active Problem List   Diagnosis Date Noted  . Morbid obesity ([place]) 06/14/2017  . Preventative health care 05/05/2017  .  Anxiety 03/24/2017  . IUD contraception 02/06/2016  . Contraception management 01/06/2016  . Glucosuria 09/17/2015  . Major depressive disorder, recurrent ([place]) 08/19/2015  . Benign neoplasm of skin of nose 07/28/2015  . Encounter for smoking cessation counseling 05/13/2015  . Low serum vitamin D 12/24/2014  . Cigarette smoker 12/21/2014  . Migraine with aura 12/21/2014  . Other fatigue 12/21/2014    Past Surgical History:  Procedure Laterality Date  . contraceptive implant      Family History  Problem Relation Age of Onset  . Hypertension Mother   . Cancer Mother        pancreatic  . Pancreatic cancer Mother   . Hypertension Brother   . Aneurysm Maternal Grandmother   . Heart attack Maternal Grandfather   . Hypertension Paternal Grandmother   . Heart failure Sister     Social History   Socioeconomic History  . Marital status: Widowed    Spouse name: Not on file  . Number of children: 2  . Years of education: Not on file  . Highest education level: Associate degree: occupational, Hotel manager, or vocational program  Occupational History  . Occupation: Scientist, research (medical)  . Occupation: Ship broker     Comment: [employer]  . Financial resource strain: Not hard at all  . Food insecurity:    Worry: Never true  Inability: Never true  . Transportation needs:    Medical: No    Non-medical: No  Tobacco Use  . Smoking status: Current Every Day Smoker    Packs/day: 0.30    Start date: 03/05/2011  . Smokeless tobacco: Never Used  Substance and Sexual Activity  . Alcohol use: Yes    Alcohol/week: 0.0 standard drinks  . Drug use: No  . Sexual activity: Not Currently    Partners: Male    Birth control/protection: I.U.D.    Comment: Patient has liletta IUD  Lifestyle  . Physical activity:    Days per week: 3 days    Minutes per session: 30 min  . Stress: Rather much  Relationships  . Social connections:    Talks on phone: More than three times a week    Gets together:  Twice a week    Attends religious service: More than 4 times per year    Active member of club or organization: Yes    Attends meetings of clubs or organizations: More than 4 times per year    Relationship status: Widowed  . Intimate partner violence:    Fear of current or ex partner: No    Emotionally abused: No    Physically abused: No    Forced sexual activity: No  Other Topics Concern  . Not on file  Social History Narrative   She is a widow, he died 08-09-09 while he was deployed.    Working part time at [employer] and is now going to school full time at [employer] to become a CMA     Current Outpatient Medications:  .  cholecalciferol (VITAMIN D) 1000 units tablet, Take 1,000 Units by mouth daily., Disp: , Rfl:  .  hydrOXYzine (ATARAX/VISTARIL) 10 MG tablet, Take 1 tablet (10 mg total) by mouth at bedtime as needed., Disp: 30 tablet, Rfl: 0 .  Multiple Vitamin (MULTI-VITAMINS) TABS, Take by mouth., Disp: , Rfl:  .  vortioxetine HBr (TRINTELLIX) 20 MG TABS tablet, Take 1 tablet (20 mg total) by mouth daily., Disp: 30 tablet, Rfl: 5  Allergies  Allergen Reactions  . Penicillins Rash    I personally reviewed active problem list, medication list, allergies, family history, social history with the patient/caregiver today.   ROS  Constitutional: Negative for fever, positive for mild  weight change.  Respiratory: Negative for cough and shortness of breath.   Cardiovascular: Negative for chest pain or palpitations.  Gastrointestinal: Negative for abdominal pain, no bowel changes.  Musculoskeletal: Negative for gait problem or joint swelling.  Skin: Negative for rash.  Neurological: Negative for dizziness or headache.  No other specific complaints in a complete review of systems (except as listed in HPI above).   Objective  Vitals:   04/05/18 0921  BP: 120/72  Pulse: 100  Resp: 16  Temp: 98 F (36.7 C)  TempSrc: Oral  SpO2: 98%  Weight: 233 lb (105.7 kg)  Height: 5\' 3"   (1.6 m)    Body mass index is 41.27 kg/m.  Physical Exam  Constitutional: Patient appears well-developed and well-nourished. Obese  No distress.  HEENT: head atraumatic, normocephalic, pupils equal and reactive to light,  neck supple, throat within normal limits Cardiovascular: Normal rate, regular rhythm and normal heart sounds.  No murmur heard. No BLE edema. Pulmonary/Chest: Effort normal and breath sounds normal. No respiratory distress. Abdominal: Soft.  There is no tenderness. Psychiatric: Patient has a normal mood and affect. behavior is normal. Judgment and thought content normal.  Recent Results (from the past 2160 hour(s))  Urinalysis, microscopic only     Status: None   Collection Time: 03/04/18  3:20 PM  Result Value Ref Range   WBC, UA NONE SEEN 0 - 5 /HPF   RBC / HPF NONE SEEN 0 - 2 /HPF   Squamous Epithelial / LPF NONE SEEN < OR = 5 /HPF   Bacteria, UA NONE SEEN NONE SEEN /HPF   Hyaline Cast NONE SEEN NONE SEEN /LPF  CBC with Differential/Platelet     Status: Abnormal   Collection Time: 03/04/18  3:51 PM  Result Value Ref Range   WBC 11.3 (H) 3.8 - 10.8 Thousand/uL   RBC 4.01 3.80 - 5.10 Million/uL   Hemoglobin 12.1 11.7 - 15.5 g/dL   HCT 35.3 35.0 - 45.0 %   MCV 88.0 80.0 - 100.0 fL   MCH 30.2 27.0 - 33.0 pg   MCHC 34.3 32.0 - 36.0 g/dL   RDW 12.4 11.0 - 15.0 %   Platelets 347 140 - 400 Thousand/uL   MPV 10.3 7.5 - 12.5 fL   Neutro Abs 7,176 1,500 - 7,800 cells/uL   Lymphs Abs 2,780 850 - 3,900 cells/uL   WBC mixed population 746 200 - 950 cells/uL   Eosinophils Absolute 475 15 - 500 cells/uL   Basophils Absolute 124 0 - 200 cells/uL   Neutrophils Relative % 63.5 %   Total Lymphocyte 24.6 %   Monocytes Relative 6.6 %   Eosinophils Relative 4.2 %   Basophils Relative 1.1 %  Hemoglobin A1c     Status: None   Collection Time: 03/04/18  3:51 PM  Result Value Ref Range   Hgb A1c MFr Bld 5.2 <5.7 % of total Hgb    Comment: For the purpose of screening for  the presence of diabetes: . <5.7%       Consistent with the absence of diabetes 5.7-6.4%    Consistent with increased risk for diabetes             (prediabetes) > or =6.5%  Consistent with diabetes . This assay result is consistent with a decreased risk of diabetes. . Currently, no consensus exists regarding use of hemoglobin A1c for diagnosis of diabetes in children. . According to American Diabetes Association (ADA) guidelines, hemoglobin A1c <7.0% represents optimal control in non-pregnant diabetic patients. Different metrics may apply to specific patient populations.  Standards of Medical Care in Diabetes(ADA). .    Mean Plasma Glucose 103 (calc)   eAG (mmol/L) 5.7 (calc)  COMPLETE METABOLIC PANEL WITH GFR     Status: None   Collection Time: 03/04/18  3:51 PM  Result Value Ref Range   Glucose, Bld 87 65 - 139 mg/dL    Comment: .        Non-fasting reference interval .    BUN 7 7 - 25 mg/dL   Creat 0.94 0.50 - 1.10 mg/dL   GFR, Est Non African American 80 > OR = 60 mL/min/1.46m2   GFR, Est African American 92 > OR = 60 mL/min/1.63m2   BUN/Creatinine Ratio NOT APPLICABLE 6 - 22 (calc)   Sodium 136 135 - 146 mmol/L   Potassium 3.6 3.5 - 5.3 mmol/L   Chloride 104 98 - 110 mmol/L   CO2 29 20 - 32 mmol/L   Calcium 9.1 8.6 - 10.2 mg/dL   Total Protein 7.2 6.1 - 8.1 g/dL   Albumin 4.0 3.6 - 5.1 g/dL   Globulin 3.2 1.9 - 3.7 g/dL (calc)  AG Ratio 1.3 1.0 - 2.5 (calc)   Total Bilirubin 0.6 0.2 - 1.2 mg/dL   Alkaline phosphatase (APISO) 60 33 - 115 U/L   AST 21 10 - 30 U/L   ALT 17 6 - 29 U/L  Varicella zoster antibody, IgG     Status: None   Collection Time: 03/04/18  3:51 PM  Result Value Ref Range   Varicella IgG 427.50 index    Comment:        Index               Interpretation      ---------         ----------------------     <135.00            Negative - Antibody not detected     135.00 - 164.99    Equivocal     > or = 165.00      Positive - Antibody  detected .     A positive result indicates that the patient     has antibody to VZV but does not differentiate     between an active or past infection.      The clinical diagnosis must be interpreted in      conjunction with the clinical signs and symptoms of      the patient. This assay reliably measures immunity     due to previous infection but may not be      sensitive enough to detect antibodies induced by     vaccination. Thus, a negative result in a vaccinated     individual does not necessarily indicate     susceptibility to VZV infection. A more sensitive     test for vaccination-induced immunity is Varicella     Zoster Virus Antibody Immunity Screen, ACIF.   QuantiFERON-TB Gold Plus     Status: None   Collection Time: 03/04/18  3:51 PM  Result Value Ref Range   QuantiFERON-TB Gold Plus NEGATIVE NEGATIVE    Comment: Negative test result. M. tuberculosis complex  infection unlikely.    NIL 0.03 IU/mL   Mitogen-NIL >10.00 IU/mL   TB1-NIL 0.02 IU/mL   TB2-NIL 0.03 IU/mL    Comment: . The Nil tube value reflects the background interferon gamma immune response of the patient's blood sample. This value has been subtracted from the patient's displayed TB and Mitogen results. . Lower than expected results with the Mitogen tube prevent false-negative Quantiferon readings by detecting a patient with a potential immune suppressive condition and/or suboptimal pre-analytical specimen handling. . The TB1 Antigen tube is coated with the M. tuberculosis-specific antigens designed to elicit responses from TB antigen primed CD4+ helper T-lymphocytes. . The TB2 Antigen tube is coated with the M. tuberculosis-specific antigens designed to elicit responses from TB antigen primed CD4+ helper and CD8+ cytotoxic T-lymphocytes. . For additional information, please refer to https://education.quest[facility].com/faq/FAQ204 (This link is being provided for informational/ educational  purposes only.) .       PHQ2/9: Depression screen [facility] 2/9 04/05/2018 03/04/2018 11/30/2017 06/14/2017 06/14/2017  Decreased Interest 0 0 0 0 0  Down, Depressed, Hopeless 0 0 0 0 0  PHQ - 2 Score 0 0 0 0 0  Altered sleeping 0 3 0 - -  Tired, decreased energy 1 0 3 - -  Change in appetite 3 0 0 - -  Feeling bad or failure about yourself  1 0 0 - -  Trouble concentrating 1 0 0 - -  Moving slowly  or fidgety/restless 0 0 0 - -  Suicidal thoughts 0 0 0 - -  PHQ-9 Score 6 3 3  - -  Difficult doing work/chores - - Not difficult at all - -     Fall Risk: Fall Risk  04/05/2018 03/04/2018 11/30/2017 06/14/2017 06/14/2017  Falls in the past year? 0 0 No No No  Number falls in past yr: 0 - - - -  Injury with Fall? 0 - - - -      Assessment & Plan  1. Morbid obesity (HCC)  - Liraglutide -Weight Management (SAXENDA) 18 MG/3ML SOPN; Inject 3 mg into the skin daily.  Dispense: 9 mL; Refill: 1  2. Anxiety  - hydrOXYzine (ATARAX/VISTARIL) 10 MG tablet; Take 1 tablet (10 mg total) by mouth at bedtime as needed.  Dispense: 90 tablet; Refill: 0  3. Cigarette smoker  Trying to quit  4. Mild episode of recurrent major depressive disorder (HCC)  Continue Trintelix   5. Other elevated white blood cell (WBC) count  Recheck next visit  6. Primary insomnia  Sleeping better with hydroxyzine

## 2018-04-06 ENCOUNTER — Ambulatory Visit: Admitting: Family Medicine

## 2018-04-15 ENCOUNTER — Telehealth: Payer: Self-pay

## 2018-04-15 MED ORDER — PHENTERMINE-TOPIRAMATE ER 7.5-46 MG PO CP24: 1.0000 | ORAL_CAPSULE | ORAL | 0 refills | Status: DC

## 2018-04-15 MED ORDER — LORCASERIN HCL ER 20 MG PO TB24: 1.0000 | ORAL_TABLET | Freq: Every day | ORAL | 1 refills | Status: DC

## 2018-04-15 MED ORDER — PHENTERMINE-TOPIRAMATE ER 3.75-23 MG PO CP24: 1.0000 | ORAL_CAPSULE | Freq: Every day | ORAL | 0 refills | Status: DC

## 2018-04-15 NOTE — Telephone Encounter (Signed)
[facility] declined Saxenda but states they will cover Qsymia, Xenial, Contrave and or Belviq-Belviq XR. Please switched to preferred medication.

## 2018-04-15 NOTE — Telephone Encounter (Signed)
Spoke with patient and notified her that the Kirke Shaggy was denied. She would like to try Qsymia.

## 2018-04-18 ENCOUNTER — Other Ambulatory Visit: Payer: Self-pay | Admitting: Family Medicine

## 2018-04-18 NOTE — Telephone Encounter (Signed)
Drug not covered by plan. The preferred alternative is Benzphetamine, Diethylpropion HCL, Phendimetrazine Tartra, Phentermine HCL. Please fax the pharmacy to change medication along with strength, directions, quantity  and refills.

## 2018-04-27 ENCOUNTER — Telehealth: Payer: Self-pay | Admitting: Family Medicine

## 2018-04-28 ENCOUNTER — Telehealth: Payer: Self-pay

## 2018-04-28 ENCOUNTER — Other Ambulatory Visit: Payer: Self-pay | Admitting: Family Medicine

## 2018-04-28 MED ORDER — PHENTERMINE-TOPIRAMATE ER 7.5-46 MG PO CP24: 1.0000 | ORAL_CAPSULE | ORAL | 0 refills | Status: DC

## 2018-04-28 NOTE — Telephone Encounter (Signed)
Pt calling back wanting the provider to send the Phentermine-Topiramate [facility])  To her pharmacy  [facility]

## 2018-04-28 NOTE — Telephone Encounter (Signed)
Patient notified insurance will not cover Qsymia but prefers Benzphetamin HCL, Diethypropion HCL, Phendimetrazinetrara, and Phentermine HCL. However Dr. Ancil Boozer does not prescribe these medications. Patient notified to call around and see how much Qsymia is out of pocket and use Good Rx. If too expensive call Insurance back and Inquire about Belviq, Contrave or anything else for weight loss assistance.

## 2018-04-28 NOTE — Telephone Encounter (Signed)
Error

## 2018-05-25 ENCOUNTER — Encounter: Payer: Self-pay | Admitting: Family Medicine

## 2018-05-25 ENCOUNTER — Ambulatory Visit (INDEPENDENT_AMBULATORY_CARE_PROVIDER_SITE_OTHER): Admitting: Family Medicine

## 2018-05-25 VITALS — BP 100/74 | HR 100 | Temp 98.5°F | Resp 16 | Ht 63.0 in | Wt 230.3 lb

## 2018-05-25 DIAGNOSIS — F330 Major depressive disorder, recurrent, mild: Secondary | ICD-10-CM | POA: Diagnosis not present

## 2018-05-25 MED ORDER — ESCITALOPRAM OXALATE 10 MG PO TABS: 10.0000 mg | ORAL_TABLET | Freq: Every day | ORAL | 0 refills | Status: DC

## 2018-05-25 NOTE — Progress Notes (Signed)
Name: Olivia King   MRN: [id number]    DOB: [birth year]   Date:05/25/2018       Progress Note  Subjective  Chief Complaint  Chief Complaint  Patient presents with  . Obesity  . Depression  . Anxiety  . Blurred Vision    Thinks that blurred vision and numbness and tingling in her fingers comes from taking Trintellex. Evaluated at eye doctor he said there was nothing wrong with her eyes. She stopped taking medication for 2 days and her vision improved.  . Numbness    in fingers    HPI  Obesity: she is now going to the bariatric center, her insurance did not cover Qsymia and she just got a rx from them for Adipex. She is going back to the gym, she has also changed her diet, taking protein shakes in am, yogurt and fruit , plus protein bars for lunch, she is cooking at home for dinner.   Depression: she has been diagnosed since age 18 yo when husband was deployed when their son was 44 yo. She has another daughter since. Tried multiple therapies. Zoloft made her more emotional, paxil did not work, the CR helped for a while but caused sleep disturbance. She is on Trintelix since 2019 but states it caused blurred vision and hand tingling. She states symptoms resolves when she stops medication for a couple of days. Phq 9 higher today. We will try to change to lexapro    Patient Active Problem List   Diagnosis Date Noted  . Morbid obesity ([place]) 06/14/2017  . Preventative health care 05/05/2017  . Anxiety 03/24/2017  . IUD contraception 02/06/2016  . Contraception management 01/06/2016  . Glucosuria 09/17/2015  . Major depressive disorder, recurrent ([place]) 08/19/2015  . Benign neoplasm of skin of nose 07/28/2015  . Encounter for smoking cessation counseling 05/13/2015  . Low serum vitamin D 12/24/2014  . Cigarette smoker 12/21/2014  . Migraine with aura 12/21/2014  . Other fatigue 12/21/2014    Past Surgical History:  Procedure Laterality Date  . contraceptive implant      Family  History  Problem Relation Age of Onset  . Hypertension Mother   . Cancer Mother        pancreatic  . Pancreatic cancer Mother   . Hypertension Brother   . Aneurysm Maternal Grandmother   . Heart attack Maternal Grandfather   . Hypertension Paternal Grandmother   . Heart failure Sister     Social History   Socioeconomic History  . Marital status: Widowed    Spouse name: Not on file  . Number of children: 2  . Years of education: Not on file  . Highest education level: Associate degree: occupational, Hotel manager, or vocational program  Occupational History  . Occupation: Scientist, research (medical)  . Occupation: Ship broker     Comment: [employer]  . Financial resource strain: Not hard at all  . Food insecurity:    Worry: Never true    Inability: Never true  . Transportation needs:    Medical: No    Non-medical: No  Tobacco Use  . Smoking status: Current Every Day Smoker    Packs/day: 0.25    Start date: 03/05/2011  . Smokeless tobacco: Never Used  Substance and Sexual Activity  . Alcohol use: Yes    Alcohol/week: 0.0 standard drinks  . Drug use: No  . Sexual activity: Not Currently    Partners: Male    Birth control/protection: I.U.D.    Comment: Patient has  liletta IUD  Lifestyle  . Physical activity:    Days per week: 3 days    Minutes per session: 30 min  . Stress: Rather much  Relationships  . Social connections:    Talks on phone: More than three times a week    Gets together: Twice a week    Attends religious service: More than 4 times per year    Active member of club or organization: Yes    Attends meetings of clubs or organizations: More than 4 times per year    Relationship status: Widowed  . Intimate partner violence:    Fear of current or ex partner: No    Emotionally abused: No    Physically abused: No    Forced sexual activity: No  Other Topics Concern  . Not on file  Social History Narrative   She is a widow, he died 08-02-09 while he was deployed.     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  cholecalciferol (VITAMIN D) 1000 units tablet, Take 1,000 Units by mouth daily., Disp: , Rfl:  .  hydrOXYzine (ATARAX/VISTARIL) 10 MG tablet, Take 1 tablet (10 mg total) by mouth at bedtime as needed., Disp: 90 tablet, Rfl: 0 .  Multiple Vitamin (MULTI-VITAMINS) TABS, Take by mouth., Disp: , Rfl:  .  vortioxetine HBr (TRINTELLIX) 20 MG TABS tablet, Take 1 tablet (20 mg total) by mouth daily., Disp: 30 tablet, Rfl: 5  Allergies  Allergen Reactions  . Penicillins Rash    I personally reviewed active problem list, medication list, allergies, family history, social history with the patient/caregiver today.   ROS  Constitutional: Negative for fever or weight change.  Respiratory: Negative for cough and shortness of breath.   Cardiovascular: Negative for chest pain or palpitations.  Gastrointestinal: Negative for abdominal pain, no bowel changes.  Musculoskeletal: Negative for gait problem or joint swelling.  Skin: Negative for rash.  Neurological: Negative for dizziness or headache.  No other specific complaints in a complete review of systems (except as listed in HPI above).  Objective  Vitals:   05/25/18 1429  BP: 100/74  Pulse: 100  Resp: 16  Temp: 98.5 F (36.9 C)  TempSrc: Oral  SpO2: 99%  Weight: 230 lb 4.8 oz (104.5 kg)  Height: 5\' 3"  (1.6 m)    Body mass index is 40.8 kg/m.  Physical Exam  Constitutional: Patient appears well-developed and well-nourished. Obese  No distress.  HEENT: head atraumatic, normocephalic, pupils equal and reactive to light,  neck supple, throat within normal limits Cardiovascular: Normal rate, regular rhythm and normal heart sounds.  No murmur heard. No BLE edema. Pulmonary/Chest: Effort normal and breath sounds normal. No respiratory distress. Abdominal: Soft.  There is no tenderness. Psychiatric:  Patient has a normal mood and affect. behavior is normal. Judgment and thought content normal.  Recent Results (from the past 2158/08/03 hour(s))  Urinalysis, microscopic only     Status: None   Collection Time: 03/04/18  3:20 PM  Result Value Ref Range   WBC, UA NONE SEEN 0 - 5 /HPF   RBC / HPF NONE SEEN 0 - 2 /HPF   Squamous Epithelial / LPF NONE SEEN < OR = 5 /HPF   Bacteria, UA NONE SEEN NONE SEEN /HPF   Hyaline Cast NONE SEEN NONE SEEN /LPF  CBC with Differential/Platelet     Status: Abnormal  Collection Time: 03/04/18  3:51 PM  Result Value Ref Range   WBC 11.3 (H) 3.8 - 10.8 Thousand/uL   RBC 4.01 3.80 - 5.10 Million/uL   Hemoglobin 12.1 11.7 - 15.5 g/dL   HCT 35.3 35.0 - 45.0 %   MCV 88.0 80.0 - 100.0 fL   MCH 30.2 27.0 - 33.0 pg   MCHC 34.3 32.0 - 36.0 g/dL   RDW 12.4 11.0 - 15.0 %   Platelets 347 140 - 400 Thousand/uL   MPV 10.3 7.5 - 12.5 fL   Neutro Abs 7,176 1,500 - 7,800 cells/uL   Lymphs Abs 2,780 850 - 3,900 cells/uL   WBC mixed population 746 200 - 950 cells/uL   Eosinophils Absolute 475 15 - 500 cells/uL   Basophils Absolute 124 0 - 200 cells/uL   Neutrophils Relative % 63.5 %   Total Lymphocyte 24.6 %   Monocytes Relative 6.6 %   Eosinophils Relative 4.2 %   Basophils Relative 1.1 %  Hemoglobin A1c     Status: None   Collection Time: 03/04/18  3:51 PM  Result Value Ref Range   Hgb A1c MFr Bld 5.2 <5.7 % of total Hgb    Comment: For the purpose of screening for the presence of diabetes: . <5.7%       Consistent with the absence of diabetes 5.7-6.4%    Consistent with increased risk for diabetes             (prediabetes) > or =6.5%  Consistent with diabetes . This assay result is consistent with a decreased risk of diabetes. . Currently, no consensus exists regarding use of hemoglobin A1c for diagnosis of diabetes in children. . According to American Diabetes Association (ADA) guidelines, hemoglobin A1c <7.0% represents optimal control in non-pregnant  diabetic patients. Different metrics may apply to specific patient populations.  Standards of Medical Care in Diabetes(ADA). .    Mean Plasma Glucose 103 (calc)   eAG (mmol/L) 5.7 (calc)  COMPLETE METABOLIC PANEL WITH GFR     Status: None   Collection Time: 03/04/18  3:51 PM  Result Value Ref Range   Glucose, Bld 87 65 - 139 mg/dL    Comment: .        Non-fasting reference interval .    BUN 7 7 - 25 mg/dL   Creat 0.94 0.50 - 1.10 mg/dL   GFR, Est Non African American 80 > OR = 60 mL/min/1.78m2   GFR, Est African American 92 > OR = 60 mL/min/1.57m2   BUN/Creatinine Ratio NOT APPLICABLE 6 - 22 (calc)   Sodium 136 135 - 146 mmol/L   Potassium 3.6 3.5 - 5.3 mmol/L   Chloride 104 98 - 110 mmol/L   CO2 29 20 - 32 mmol/L   Calcium 9.1 8.6 - 10.2 mg/dL   Total Protein 7.2 6.1 - 8.1 g/dL   Albumin 4.0 3.6 - 5.1 g/dL   Globulin 3.2 1.9 - 3.7 g/dL (calc)   AG Ratio 1.3 1.0 - 2.5 (calc)   Total Bilirubin 0.6 0.2 - 1.2 mg/dL   Alkaline phosphatase (APISO) 60 33 - 115 U/L   AST 21 10 - 30 U/L   ALT 17 6 - 29 U/L  Varicella zoster antibody, IgG     Status: None   Collection Time: 03/04/18  3:51 PM  Result Value Ref Range   Varicella IgG 427.50 index    Comment:        Index  Interpretation      ---------         ----------------------     <135.00            Negative - Antibody not detected     135.00 - 164.99    Equivocal     > or = 165.00      Positive - Antibody detected .     A positive result indicates that the patient     has antibody to VZV but does not differentiate     between an active or past infection.      The clinical diagnosis must be interpreted in      conjunction with the clinical signs and symptoms of      the patient. This assay reliably measures immunity     due to previous infection but may not be      sensitive enough to detect antibodies induced by     vaccination. Thus, a negative result in a vaccinated     individual does not necessarily  indicate     susceptibility to VZV infection. A more sensitive     test for vaccination-induced immunity is Varicella     Zoster Virus Antibody Immunity Screen, ACIF.   QuantiFERON-TB Gold Plus     Status: None   Collection Time: 03/04/18  3:51 PM  Result Value Ref Range   QuantiFERON-TB Gold Plus NEGATIVE NEGATIVE    Comment: Negative test result. M. tuberculosis complex  infection unlikely.    NIL 0.03 IU/mL   Mitogen-NIL >10.00 IU/mL   TB1-NIL 0.02 IU/mL   TB2-NIL 0.03 IU/mL    Comment: . The Nil tube value reflects the background interferon gamma immune response of the patient's blood sample. This value has been subtracted from the patient's displayed TB and Mitogen results. . Lower than expected results with the Mitogen tube prevent false-negative Quantiferon readings by detecting a patient with a potential immune suppressive condition and/or suboptimal pre-analytical specimen handling. . The TB1 Antigen tube is coated with the M. tuberculosis-specific antigens designed to elicit responses from TB antigen primed CD4+ helper T-lymphocytes. . The TB2 Antigen tube is coated with the M. tuberculosis-specific antigens designed to elicit responses from TB antigen primed CD4+ helper and CD8+ cytotoxic T-lymphocytes. . For additional information, please refer to https://education.questdiagnostics.com/faq/FAQ204 (This link is being provided for informational/ educational purposes only.) .      PHQ2/9: Depression screen [facility] 2/9 05/25/2018 04/05/2018 03/04/2018 11/30/2017 06/14/2017  Decreased Interest 2 0 0 0 0  Down, Depressed, Hopeless 0 0 0 0 0  PHQ - 2 Score 2 0 0 0 0  Altered sleeping 1 0 3 0 -  Tired, decreased energy 2 1 0 3 -  Change in appetite 1 3 0 0 -  Feeling bad or failure about yourself  2 1 0 0 -  Trouble concentrating 0 1 0 0 -  Moving slowly or fidgety/restless 2 0 0 0 -  Suicidal thoughts 0 0 0 0 -  PHQ-9 Score 10 6 3 3  -  Difficult doing  work/chores Somewhat difficult - - Not difficult at all -     Fall Risk: Fall Risk  05/25/2018 04/05/2018 03/04/2018 11/30/2017 06/14/2017  Falls in the past year? 0 0 0 No No  Number falls in past yr: 0 0 - - -  Injury with Fall? 0 0 - - -     Assessment & Plan  1. Mild episode of recurrent major depressive disorder ([place])  She states trintelix is the cause of her blurred vision and tingling, we will try lexapro  - escitalopram (LEXAPRO) 10 MG tablet; Take 1 tablet (10 mg total) by mouth daily.  Dispense: 30 tablet; Refill: 0  2. Morbid obesity ([place])  Discussed with the patient the risk posed by an increased BMI. Discussed importance of portion control, calorie counting and at least 150 minutes of physical activity weekly. Avoid sweet beverages and drink more water. Eat at least 6 servings of fruit and vegetables daily

## 2018-07-06 ENCOUNTER — Ambulatory Visit (INDEPENDENT_AMBULATORY_CARE_PROVIDER_SITE_OTHER): Admitting: Family Medicine

## 2018-07-06 ENCOUNTER — Encounter: Payer: Self-pay | Admitting: Family Medicine

## 2018-07-06 ENCOUNTER — Other Ambulatory Visit: Payer: Self-pay

## 2018-07-06 VITALS — BP 114/78 | HR 97 | Temp 99.2°F | Resp 16 | Ht 63.0 in | Wt 224.2 lb

## 2018-07-06 DIAGNOSIS — J301 Allergic rhinitis due to pollen: Secondary | ICD-10-CM

## 2018-07-06 DIAGNOSIS — F330 Major depressive disorder, recurrent, mild: Secondary | ICD-10-CM | POA: Diagnosis not present

## 2018-07-06 MED ORDER — FLUTICASONE PROPIONATE 50 MCG/ACT NA SUSP: 2.0000 | Freq: Every day | NASAL | 2 refills | Status: DC

## 2018-07-06 MED ORDER — ESCITALOPRAM OXALATE 10 MG PO TABS: 10.0000 mg | ORAL_TABLET | Freq: Every day | ORAL | 0 refills | Status: DC

## 2018-07-06 MED ORDER — LORATADINE 10 MG PO TABS: 10.0000 mg | ORAL_TABLET | Freq: Every day | ORAL | 0 refills | Status: DC

## 2018-07-06 MED ORDER — OLOPATADINE HCL 0.1 % OP SOLN: 1.0000 [drp] | Freq: Two times a day (BID) | OPHTHALMIC | 2 refills | Status: DC

## 2018-07-06 NOTE — Addendum Note (Signed)
Addended by: Steele Sizer F on: 07/06/2018 03:58 PM   Modules accepted: Orders

## 2018-07-06 NOTE — Progress Notes (Addendum)
Name: Olivia King   MRN: [id number]    DOB: 02-Sep-1985   Date:07/06/2018       Progress Note  Subjective  Chief Complaint  Chief Complaint  Patient presents with  . Follow-up    1 month F/U  . Depression    Doing well with medication-working out 3x weekly and has lost 6 pounds since last visit    HPI  Depression: she has been diagnosed since age 33 yo when husband was deployed when their son was 52 yo. She has another daughter since. Tried multiple therapies. Zoloft made her more emotional, paxil did not work, the CR helped for a while but caused sleep disturbance. She is on Trintelix since 2019 but states it caused blurred vision and hand tingling. She started Lexapro Feb 2020 and no side effects and states within 3 weeks she noticed improvement of her mood, less fatigue, more motivation and has been able to go for walks and exercise, took her children on individual dates and that seems to have helped. Phq9 is down from 10 to 2. She still feels worries that she is failing as a parent. Discussed therapy again  Seasonal allergic rhinitis: she states had problems as a child, severe last year. She states taking benadryl but waking up with puffy and itchy eyes and also rhinorrhea - clear,and mild dry cough, no wheezing.   Patient Active Problem List   Diagnosis Date Noted  . Morbid obesity ([place]) 06/14/2017  . Preventative health care 05/05/2017  . Anxiety 03/24/2017  . IUD contraception 02/06/2016  . Contraception management 01/06/2016  . Glucosuria 09/17/2015  . Major depressive disorder, recurrent ([place]) 08/19/2015  . Benign neoplasm of skin of nose 07/28/2015  . Encounter for smoking cessation counseling 05/13/2015  . Low serum vitamin D 12/24/2014  . Cigarette smoker 12/21/2014  . Migraine with aura 12/21/2014  . Other fatigue 12/21/2014    Past Surgical History:  Procedure Laterality Date  . contraceptive implant      Family History  Problem Relation Age of Onset  .  Hypertension Mother   . Cancer Mother        pancreatic  . Pancreatic cancer Mother   . Hypertension Brother   . Aneurysm Maternal Grandmother   . Heart attack Maternal Grandfather   . Hypertension Paternal Grandmother   . Heart failure Sister     Social History   Socioeconomic History  . Marital status: Widowed    Spouse name: Not on file  . Number of children: 2  . Years of education: Not on file  . Highest education level: Associate degree: occupational, Hotel manager, or vocational program  Occupational History  . Occupation: Scientist, research (medical)  . Occupation: Ship broker     Comment: [place]  . Financial resource strain: Not hard at all  . Food insecurity:    Worry: Never true    Inability: Never true  . Transportation needs:    Medical: No    Non-medical: No  Tobacco Use  . Smoking status: Current Every Day Smoker    Packs/day: 0.15    Types: Cigarettes    Start date: 03/05/2011  . Smokeless tobacco: Never Used  Substance and Sexual Activity  . Alcohol use: Yes    Alcohol/week: 0.0 standard drinks  . Drug use: No  . Sexual activity: Not Currently    Partners: Male    Birth control/protection: I.U.D.    Comment: Patient has liletta IUD  Lifestyle  . Physical activity:  Days per week: 3 days    Minutes per session: 30 min  . Stress: Rather much  Relationships  . Social connections:    Talks on phone: More than three times a week    Gets together: Twice a week    Attends religious service: More than 4 times per year    Active member of club or organization: Yes    Attends meetings of clubs or organizations: More than 4 times per year    Relationship status: Widowed  . Intimate partner violence:    Fear of current or ex partner: No    Emotionally abused: No    Physically abused: No    Forced sexual activity: No  Other Topics Concern  . Not on file  Social History Narrative   She is a widow, he died 2009/08/07 while he was deployed.    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  cholecalciferol (VITAMIN D) 1000 units tablet, Take 1,000 Units by mouth daily., Disp: , Rfl:  .  clindamycin (CLEOCIN T) 1 % external solution, , Disp: , Rfl:  .  escitalopram (LEXAPRO) 10 MG tablet, Take 1 tablet (10 mg total) by mouth daily., Disp: 30 tablet, Rfl: 0 .  hydrOXYzine (ATARAX/VISTARIL) 10 MG tablet, Take 1 tablet (10 mg total) by mouth at bedtime as needed., Disp: 90 tablet, Rfl: 0 .  Multiple Vitamin (MULTI-VITAMINS) TABS, Take by mouth., Disp: , Rfl:  .  phentermine (ADIPEX-P) 37.5 MG tablet, Take 1 tablet by mouth daily., Disp: , Rfl:  .  triamcinolone cream (KENALOG) 0.1 %, APPLY TO ACTIVE LESIONS EVERY DAY, Disp: , Rfl:   Allergies  Allergen Reactions  . Penicillins Rash  . Trintellix [Vortioxetine]     Tingling hands and blurred vision     I personally reviewed active problem list, medication list, allergies, family history, social history with the patient/caregiver today.   ROS  Constitutional: Negative for fever or weight change.  Respiratory: Negative for cough and shortness of breath.   Cardiovascular: Negative for chest pain or palpitations.  Gastrointestinal: Negative for abdominal pain, no bowel changes.  Musculoskeletal: Negative for gait problem or joint swelling.  Skin: Negative for rash.  Neurological: Negative for dizziness or headache.  No other specific complaints in a complete review of systems (except as listed in HPI above).  Objective  Vitals:   07/06/18 1508  BP: 114/78  Pulse: 97  Resp: 16  Temp: 99.2 F (37.3 C)  TempSrc: Oral  SpO2: 97%  Weight: 224 lb 3.2 oz (101.7 kg)  Height: 5\' 3"  (1.6 m)    Body mass index is 39.72 kg/m.  Physical Exam  Constitutional: Patient appears well-developed and well-nourished. Obese No distress.  HEENT: head atraumatic, normocephalic, pupils equal and reactive to light,  neck supple, throat within normal limits Cardiovascular: Normal rate, regular rhythm and normal heart sounds.  No murmur heard. No BLE edema. Pulmonary/Chest: Effort normal and breath sounds normal. No respiratory distress. Abdominal: Soft.  There is no tenderness. Psychiatric: Patient has a normal mood and affect. behavior is normal. Judgment and thought content normal.  PHQ2/9: Depression screen [facility] 2/9 07/06/2018 05/25/2018 04/05/2018 03/04/2018 11/30/2017  Decreased Interest 0 2 0 0 0  Down, Depressed, Hopeless 0 0 0 0 0  PHQ - 2 Score 0 2 0 0 0  Altered sleeping 0 1 0 3 0  Tired, decreased energy 0 2 1 0 3  Change in appetite 0 1 3 0 0  Feeling bad or failure about yourself  2 2 1  0 0  Trouble concentrating 0 0 1 0 0  Moving slowly or fidgety/restless 0 2 0 0 0  Suicidal thoughts 0 0 0 0 0  PHQ-9 Score 2 10 6 3 3   Difficult doing work/chores Not difficult at all Somewhat difficult - - Not difficult at all  Some recent data might be hidden     Fall Risk: Fall Risk  07/06/2018 05/25/2018 04/05/2018 03/04/2018 11/30/2017  Falls in the past year? 0 0 0 0 No  Number falls in past yr: 0 0 0 - -  Injury with Fall? 0 0 0 - -    Functional Status Survey: Is the patient deaf or have difficulty hearing?: No Does the patient have difficulty seeing, even when wearing glasses/contacts?: No Does the patient have difficulty concentrating, remembering, or making decisions?: No Does the patient have difficulty walking or climbing stairs?: No Does the patient have difficulty dressing or bathing?: No Does the patient have difficulty doing errands alone such as visiting a doctor's office or shopping?: No    Assessment & Plan   1. Mild episode of recurrent major depressive disorder ([place])  Doing well, continue medication, try to have 30 minutes daily of time alone for self-care .  - escitalopram (LEXAPRO) 10 MG tablet; Take 1 tablet (10 mg total) by mouth daily.  Dispense: 90 tablet; Refill:  0   2. Seasonal allergic rhinitis due to pollen  - loratadine (CLARITIN) 10 MG tablet; Take 1 tablet (10 mg total) by mouth daily.  Dispense: 90 tablet; Refill: 0 - fluticasone (FLONASE) 50 MCG/ACT nasal spray; Place 2 sprays into both nostrils daily.  Dispense: 16 g; Refill: 2 - olopatadine (PATANOL) 0.1 % ophthalmic solution; Place 1 drop into both eyes 2 (two) times daily.  Dispense: 5 mL; Refill: 2

## 2018-07-22 ENCOUNTER — Encounter: Payer: Self-pay | Admitting: Family Medicine

## 2018-07-22 ENCOUNTER — Other Ambulatory Visit: Payer: Self-pay

## 2018-07-22 ENCOUNTER — Ambulatory Visit (INDEPENDENT_AMBULATORY_CARE_PROVIDER_SITE_OTHER): Admitting: Family Medicine

## 2018-07-22 VITALS — BP 110/80 | HR 106 | Temp 99.1°F | Resp 16 | Ht 63.0 in | Wt 220.0 lb

## 2018-07-22 DIAGNOSIS — N6320 Unspecified lump in the left breast, unspecified quadrant: Secondary | ICD-10-CM

## 2018-07-22 DIAGNOSIS — N6325 Unspecified lump in the left breast, overlapping quadrants: Secondary | ICD-10-CM

## 2018-07-22 DIAGNOSIS — N644 Mastodynia: Secondary | ICD-10-CM | POA: Diagnosis not present

## 2018-07-22 MED ORDER — DOXYCYCLINE HYCLATE 100 MG PO TABS: 100.0000 mg | ORAL_TABLET | Freq: Two times a day (BID) | ORAL | 0 refills | Status: DC

## 2018-07-22 NOTE — Progress Notes (Signed)
Name: Olivia King   MRN: [id number]    DOB: 1985/05/23   Date:07/22/2018       Progress Note  Subjective  Chief Complaint  Chief Complaint  Patient presents with  . Breast Mass    HPI  Breast lump: she noticed a small lump yesterday when taking a shower and doing a breast exam, this morning the area was red and painful, no drainage. She has history of nipple piercing but removed last week ( she had it on for 7-8 years ). She has no family history of breast cancer No fever or chills. No nausea or vomiting.   Patient Active Problem List   Diagnosis Date Noted  . Morbid obesity ([place]) 06/14/2017  . Preventative health care 05/05/2017  . Anxiety 03/24/2017  . IUD contraception 02/06/2016  . Contraception management 01/06/2016  . Glucosuria 09/17/2015  . Major depressive disorder, recurrent ([place]) 08/19/2015  . Benign neoplasm of skin of nose 07/28/2015  . Encounter for smoking cessation counseling 05/13/2015  . Low serum vitamin D 12/24/2014  . Cigarette smoker 12/21/2014  . Migraine with aura 12/21/2014  . Other fatigue 12/21/2014    Social History   Tobacco Use  . Smoking status: Current Every Day Smoker    Packs/day: 0.15    Types: Cigarettes    Start date: 03/05/2011  . Smokeless tobacco: Never Used  Substance Use Topics  . Alcohol use: Yes    Alcohol/week: 0.0 standard drinks     Current Outpatient Medications:  .  cholecalciferol (VITAMIN D) 1000 units tablet, Take 1,000 Units by mouth daily., Disp: , Rfl:  .  clindamycin (CLEOCIN T) 1 % external solution, , Disp: , Rfl:  .  escitalopram (LEXAPRO) 10 MG tablet, Take 1 tablet (10 mg total) by mouth daily., Disp: 90 tablet, Rfl: 0 .  fluticasone (FLONASE) 50 MCG/ACT nasal spray, Place 2 sprays into both nostrils daily., Disp: 16 g, Rfl: 2 .  hydrOXYzine (ATARAX/VISTARIL) 10 MG tablet, Take 1 tablet (10 mg total) by mouth at bedtime as needed., Disp: 90 tablet, Rfl: 0 .  loratadine (CLARITIN) 10 MG tablet, Take 1  tablet (10 mg total) by mouth daily., Disp: 90 tablet, Rfl: 0 .  Multiple Vitamin (MULTI-VITAMINS) TABS, Take by mouth., Disp: , Rfl:  .  olopatadine (PATANOL) 0.1 % ophthalmic solution, Place 1 drop into both eyes 2 (two) times daily., Disp: 5 mL, Rfl: 2 .  phentermine (ADIPEX-P) 37.5 MG tablet, Take 1 tablet by mouth daily., Disp: , Rfl:  .  triamcinolone cream (KENALOG) 0.1 %, APPLY TO ACTIVE LESIONS EVERY DAY, Disp: , Rfl:   Allergies  Allergen Reactions  . Penicillins Rash  . Trintellix [Vortioxetine]     Tingling hands and blurred vision     ROS  Ten systems reviewed and is negative except as mentioned in HPI   Objective  Vitals:   07/22/18 1027  BP: 110/80  Pulse: (!) 106  Resp: 16  Temp: 99.1 F (37.3 C)  TempSrc: Oral  SpO2: 99%  Weight: 220 lb (99.8 kg)  Height: 5\' 3"  (1.6 m)    Body mass index is 38.97 kg/m.    Physical Exam  Constitutional: Patient appears well-developed and well-nourished. Obese  No distress.  HEENT: head atraumatic, normocephalic, pupils equal and reactive to light,  neck supple, throat within normal limits Breast: lumpy breasts but left side has a pea size nodule above areola at 12 o'clock, some erythema surrounding the area, no nipple discharge.  Cardiovascular: Normal rate,  regular rhythm and normal heart sounds.  No murmur heard. No BLE edema. Pulmonary/Chest: Effort normal and breath sounds normal. No respiratory distress. Abdominal: Soft.  There is no tenderness. Psychiatric: Patient has a normal mood and affect. behavior is normal. Judgment and thought content normal.   Assessment & Plan   1. Mastalgia  - doxycycline (VIBRA-TABS) 100 MG tablet; Take 1 tablet (100 mg total) by mouth 2 (two) times daily.  Dispense: 20 tablet; Refill: 0  2. Breast lump on left side at 12 o'clock position  - doxycycline (VIBRA-TABS) 100 MG tablet; Take 1 tablet (100 mg total) by mouth 2 (two) times daily.  Dispense: 20 tablet; Refill:  0  Likely abscess versus cellulitis and if no resolution she will call back for referral to surgeon / [place]

## 2018-07-26 ENCOUNTER — Other Ambulatory Visit: Payer: Self-pay

## 2018-07-26 ENCOUNTER — Encounter: Payer: Self-pay | Admitting: Family Medicine

## 2018-07-26 ENCOUNTER — Other Ambulatory Visit (HOSPITAL_COMMUNITY)
Admission: RE | Admit: 2018-07-26 | Discharge: 2018-07-26 | Disposition: A | Discharge status: Home / Self Care | Source: Ambulatory Visit | Attending: Family Medicine | Admitting: Family Medicine

## 2018-07-26 ENCOUNTER — Ambulatory Visit (INDEPENDENT_AMBULATORY_CARE_PROVIDER_SITE_OTHER): Admitting: Family Medicine

## 2018-07-26 VITALS — BP 112/68 | HR 99 | Temp 98.9°F | Resp 16 | Ht 63.0 in | Wt 221.1 lb

## 2018-07-26 DIAGNOSIS — Z124 Encounter for screening for malignant neoplasm of cervix: Secondary | ICD-10-CM | POA: Insufficient documentation

## 2018-07-26 DIAGNOSIS — N898 Other specified noninflammatory disorders of vagina: Secondary | ICD-10-CM | POA: Diagnosis present

## 2018-07-26 DIAGNOSIS — Z113 Encounter for screening for infections with a predominantly sexual mode of transmission: Secondary | ICD-10-CM | POA: Insufficient documentation

## 2018-07-26 NOTE — Progress Notes (Signed)
Name: Olivia King   MRN: [id number]    DOB: 1986-04-15   Date:07/26/2018       Progress Note  Subjective  Chief Complaint  Chief Complaint  Patient presents with  . Vaginal Discharge    Onset- Monday, mikly like and watery discharge, no odor.     HPI  Vaginal discharge: she has not been sexually active in the past 3 years, however had intercourse without condoms two weeks ago and again last week, same partner. She noticed vaginal discharge that is milky, with some odor, no itching or discomfort. No fever or chills. We will check STI's and get pap smear today    Patient Active Problem List   Diagnosis Date Noted  . Morbid obesity ([place]) 06/14/2017  . Preventative health care 05/05/2017  . Anxiety 03/24/2017  . IUD contraception 02/06/2016  . Contraception management 01/06/2016  . Glucosuria 09/17/2015  . Major depressive disorder, recurrent ([place]) 08/19/2015  . Benign neoplasm of skin of nose 07/28/2015  . Encounter for smoking cessation counseling 05/13/2015  . Low serum vitamin D 12/24/2014  . Cigarette smoker 12/21/2014  . Migraine with aura 12/21/2014  . Other fatigue 12/21/2014    Past Surgical History:  Procedure Laterality Date  . contraceptive implant      Family History  Problem Relation Age of Onset  . Hypertension Mother   . Cancer Mother        pancreatic  . Pancreatic cancer Mother   . Hypertension Brother   . Aneurysm Maternal Grandmother   . Heart attack Maternal Grandfather   . Hypertension Paternal Grandmother   . Heart failure Sister     Social History   Socioeconomic History  . Marital status: Widowed    Spouse name: Not on file  . Number of children: 2  . Years of education: Not on file  . Highest education level: Associate degree: occupational, Hotel manager, or vocational program  Occupational History  . Occupation: Scientist, research (medical)  . Occupation: Ship broker     Comment: [place]  . Financial resource strain: Not hard at all  . Food  insecurity:    Worry: Never true    Inability: Never true  . Transportation needs:    Medical: No    Non-medical: No  Tobacco Use  . Smoking status: Current Every Day Smoker    Packs/day: 0.25    Types: Cigarettes    Start date: 03/05/2011  . Smokeless tobacco: Never Used  Substance and Sexual Activity  . Alcohol use: Yes    Alcohol/week: 0.0 standard drinks  . Drug use: No  . Sexual activity: Not Currently    Partners: Male    Birth control/protection: I.U.D.    Comment: Patient has liletta IUD  Lifestyle  . Physical activity:    Days per week: 3 days    Minutes per session: 30 min  . Stress: Rather much  Relationships  . Social connections:    Talks on phone: More than three times a week    Gets together: Twice a week    Attends religious service: More than 4 times per year    Active member of club or organization: Yes    Attends meetings of clubs or organizations: More than 4 times per year    Relationship status: Widowed  . Intimate partner violence:    Fear of current or ex partner: No    Emotionally abused: No    Physically abused: No    Forced sexual activity: No  Other Topics Concern  . Not on file  Social History Narrative   She is a widow, he died 2009/08/07 while he was deployed.    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  cholecalciferol (VITAMIN D) 1000 units tablet, Take 1,000 Units by mouth daily., Disp: , Rfl:  .  doxycycline (VIBRA-TABS) 100 MG tablet, Take 1 tablet (100 mg total) by mouth 2 (two) times daily., Disp: 20 tablet, Rfl: 0 .  escitalopram (LEXAPRO) 10 MG tablet, Take 1 tablet (10 mg total) by mouth daily., Disp: 90 tablet, Rfl: 0 .  fluticasone (FLONASE) 50 MCG/ACT nasal spray, Place 2 sprays into both nostrils daily., Disp: 16 g, Rfl: 2 .  hydrOXYzine (ATARAX/VISTARIL) 10 MG tablet, Take 1 tablet (10 mg total) by mouth at bedtime as needed., Disp:  90 tablet, Rfl: 0 .  loratadine (CLARITIN) 10 MG tablet, Take 1 tablet (10 mg total) by mouth daily., Disp: 90 tablet, Rfl: 0 .  Multiple Vitamin (MULTI-VITAMINS) TABS, Take by mouth., Disp: , Rfl:  .  olopatadine (PATANOL) 0.1 % ophthalmic solution, Place 1 drop into both eyes 2 (two) times daily., Disp: 5 mL, Rfl: 2 .  phentermine (ADIPEX-P) 37.5 MG tablet, Take 1 tablet by mouth daily., Disp: , Rfl:  .  triamcinolone cream (KENALOG) 0.1 %, APPLY TO ACTIVE LESIONS EVERY DAY, Disp: , Rfl:   Allergies  Allergen Reactions  . Penicillins Rash  . Trintellix [Vortioxetine]     Tingling hands and blurred vision     I personally reviewed active problem list, medication list, allergies, family history, social history with the patient/caregiver today.   ROS  Ten systems reviewed and is negative except as mentioned in HPI   Objective  Vitals:   07/26/18 1136  BP: 112/68  Pulse: 99  Resp: 16  Temp: 98.9 F (37.2 C)  TempSrc: Oral  SpO2: 99%  Weight: 221 lb 1.6 oz (100.3 kg)  Height: 5\' 3"  (1.6 m)    Body mass index is 39.17 kg/m.  Physical Exam  Constitutional: Patient appears well-developed and well-nourished. Obese  No distress.  HEENT: head atraumatic, normocephalic, pupils equal and reactive to light,neck supple, throat within normal limits Breast: lump resolved, never took antibiotics, no redness or pain during breast exam Pelvic: IUD in place, very mild white discharge, no odor , pap smear collected Cardiovascular: Normal rate, regular rhythm and normal heart sounds.  No murmur heard. No BLE edema. Pulmonary/Chest: Effort normal and breath sounds normal. No respiratory distress. Abdominal: Soft.  There is no tenderness. Psychiatric: Patient has a normal mood and affect. behavior is normal. Judgment and thought content normal.  PHQ2/9: Depression screen [facility] 2/9 07/26/2018 07/06/2018 05/25/2018 04/05/2018 03/04/2018  Decreased Interest 0 0 2 0 0  Down, Depressed, Hopeless 0  0 0 0 0  PHQ - 2 Score 0 0 2 0 0  Altered sleeping 0 0 1 0 3  Tired, decreased energy 0 0 2 1 0  Change in appetite 1 0 1 3 0  Feeling bad or failure about yourself  0 2 2 1  0  Trouble concentrating 3 0 0 1 0  Moving slowly or fidgety/restless 0 0 2 0 0  Suicidal thoughts 0 0 0 0 0  PHQ-9 Score 4 2 10 6 3   Difficult doing work/chores Not difficult at all Not difficult at all Somewhat difficult - -  Some recent data might be hidden    phq 9 is positive    Fall Risk: Fall Risk  07/22/2018 07/06/2018 05/25/2018 04/05/2018 03/04/2018  Falls in the past year? 0 0 0 0 0  Number falls in past yr: 0 0 0 0 -  Injury with Fall? 0 0 0 0 -      Assessment & Plan  1. Routine screening for STI (sexually transmitted infection)  - RPR - HIV Antibody (routine testing w rflx) - Hepatitis panel, acute - Cytology - PAP - Cervicovaginal ancillary only  2. Vaginal discharge  - Cytology - PAP - Cervicovaginal ancillary only  3. Cervical cancer screening  - Cytology - PAP - Cervicovaginal ancillary only

## 2018-07-27 LAB — HEPATITIS PANEL, ACUTE
Hep A IgM: NONREACTIVE
Hep B C IgM: NONREACTIVE
Hepatitis B Surface Ag: NONREACTIVE
Hepatitis C Ab: NONREACTIVE
SIGNAL TO CUT-OFF: 0.19

## 2018-07-27 LAB — SYPHILIS: RPR W/REFLEX TO RPR TITER AND TREPONEMAL ANTIBODIES, TRADITIONAL SCREENING AND DIAGNOSIS ALGORITHM: RPR Ser Ql: NONREACTIVE

## 2018-07-27 LAB — HIV ANTIBODY (ROUTINE TESTING W REFLEX): HIV 1&2 Ab, 4th Generation: NONREACTIVE

## 2018-07-28 LAB — CYTOLOGY - PAP
Adequacy: ABSENT
Chlamydia: NEGATIVE
Neisseria Gonorrhea: NEGATIVE
Trichomonas: NEGATIVE

## 2018-07-28 LAB — CERVICOVAGINAL ANCILLARY ONLY
Bacterial vaginitis: POSITIVE — AB
Candida vaginitis: NEGATIVE

## 2018-07-29 ENCOUNTER — Other Ambulatory Visit: Payer: Self-pay | Admitting: Family Medicine

## 2018-07-29 MED ORDER — METRONIDAZOLE 500 MG PO TABS: 500.0000 mg | ORAL_TABLET | Freq: Two times a day (BID) | ORAL | 0 refills | Status: DC

## 2018-08-30 ENCOUNTER — Ambulatory Visit (INDEPENDENT_AMBULATORY_CARE_PROVIDER_SITE_OTHER): Admitting: Family Medicine

## 2018-08-30 ENCOUNTER — Other Ambulatory Visit (HOSPITAL_COMMUNITY)
Admission: RE | Admit: 2018-08-30 | Discharge: 2018-08-30 | Disposition: A | Discharge status: Home / Self Care | Source: Ambulatory Visit | Attending: Family Medicine | Admitting: Family Medicine

## 2018-08-30 ENCOUNTER — Encounter: Payer: Self-pay | Admitting: Family Medicine

## 2018-08-30 ENCOUNTER — Other Ambulatory Visit: Payer: Self-pay

## 2018-08-30 VITALS — BP 120/80 | HR 90 | Temp 99.2°F | Resp 16 | Ht 63.0 in | Wt 218.5 lb

## 2018-08-30 DIAGNOSIS — N898 Other specified noninflammatory disorders of vagina: Secondary | ICD-10-CM | POA: Insufficient documentation

## 2018-08-30 DIAGNOSIS — Z113 Encounter for screening for infections with a predominantly sexual mode of transmission: Secondary | ICD-10-CM

## 2018-08-30 MED ORDER — CLINDAMYCIN PHOSPHATE 2 % VA CREA: 1.0000 | TOPICAL_CREAM | Freq: Every day | VAGINAL | 0 refills | Status: DC

## 2018-08-30 NOTE — Progress Notes (Signed)
Name: Olivia King   MRN: [id number]    DOB: [birth year]   Date:08/30/2018       Progress Note  Subjective  Chief Complaint  Chief Complaint  Patient presents with  . Exposure to STD    bright red blood, discharge and itching.    HPI  Vaginal discharge: she is dating same person, however last intercourse was 6 days ago and she found a pair or earrings on his bathroom. She states since than she had vaginal bleeding  ( she has an IUD) , some cramping, and a vaginal discharge, no pain or discomfort. She states right now she has a mild odor also    Patient Active Problem List   Diagnosis Date Noted  . Morbid obesity ([place]) 06/14/2017  . Preventative health care 05/05/2017  . Anxiety 03/24/2017  . IUD contraception 02/06/2016  . Contraception management 01/06/2016  . Glucosuria 09/17/2015  . Major depressive disorder, recurrent ([place]) 08/19/2015  . Benign neoplasm of skin of nose 07/28/2015  . Encounter for smoking cessation counseling 05/13/2015  . Low serum vitamin D 12/24/2014  . Cigarette smoker 12/21/2014  . Migraine with aura 12/21/2014  . Other fatigue 12/21/2014    Past Surgical History:  Procedure Laterality Date  . contraceptive implant      Family History  Problem Relation Age of Onset  . Hypertension Mother   . Cancer Mother        pancreatic  . Pancreatic cancer Mother   . Hypertension Brother   . Aneurysm Maternal Grandmother   . Heart attack Maternal Grandfather   . Hypertension Paternal Grandmother   . Heart failure Sister     Social History   Socioeconomic History  . Marital status: Widowed    Spouse name: Not on file  . Number of children: 2  . Years of education: Not on file  . Highest education level: Associate degree: occupational, Hotel manager, or vocational program  Occupational History  . Occupation: Scientist, research (medical)  . Occupation: Ship broker     Comment: [place]  . Financial resource strain: Not hard at all  . Food insecurity:     Worry: Never true    Inability: Never true  . Transportation needs:    Medical: No    Non-medical: No  Tobacco Use  . Smoking status: Current Every Day Smoker    Packs/day: 0.25    Types: Cigarettes    Start date: 03/05/2011  . Smokeless tobacco: Never Used  Substance and Sexual Activity  . Alcohol use: Yes    Alcohol/week: 0.0 standard drinks  . Drug use: No  . Sexual activity: Not Currently    Partners: Male    Birth control/protection: I.U.D.    Comment: Patient has liletta IUD  Lifestyle  . Physical activity:    Days per week: 3 days    Minutes per session: 30 min  . Stress: Rather much  Relationships  . Social connections:    Talks on phone: More than three times a week    Gets together: Twice a week    Attends religious service: More than 4 times per year    Active member of club or organization: Yes    Attends meetings of clubs or organizations: More than 4 times per year    Relationship status: Widowed  . Intimate partner violence:    Fear of current or ex partner: No    Emotionally abused: No    Physically abused: No    Forced sexual activity:  No  Other Topics Concern  . Not on file  Social History Narrative   She is a widow, he died August 10, 2009 while he was deployed.    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  cholecalciferol (VITAMIN D) 1000 units tablet, Take 1,000 Units by mouth daily., Disp: , Rfl:  .  escitalopram (LEXAPRO) 10 MG tablet, Take 1 tablet (10 mg total) by mouth daily., Disp: 90 tablet, Rfl: 0 .  fluticasone (FLONASE) 50 MCG/ACT nasal spray, Place 2 sprays into both nostrils daily., Disp: 16 g, Rfl: 2 .  hydrOXYzine (ATARAX/VISTARIL) 10 MG tablet, Take 1 tablet (10 mg total) by mouth at bedtime as needed., Disp: 90 tablet, Rfl: 0 .  loratadine (CLARITIN) 10 MG tablet, Take 1 tablet (10 mg total) by mouth daily., Disp: 90 tablet, Rfl: 0 .  Multiple Vitamin  (MULTI-VITAMINS) TABS, Take by mouth., Disp: , Rfl:  .  olopatadine (PATANOL) 0.1 % ophthalmic solution, Place 1 drop into both eyes 2 (two) times daily., Disp: 5 mL, Rfl: 2 .  phentermine (ADIPEX-P) 37.5 MG tablet, Take 1 tablet by mouth daily., Disp: , Rfl:  .  triamcinolone cream (KENALOG) 0.1 %, APPLY TO ACTIVE LESIONS EVERY DAY, Disp: , Rfl:  .  clindamycin (CLEOCIN) 2 % vaginal cream, Place 1 Applicatorful vaginally at bedtime., Disp: 40 g, Rfl: 0  Allergies  Allergen Reactions  . Penicillins Rash  . Trintellix [Vortioxetine]     Tingling hands and blurred vision     I personally reviewed active problem list, medication list, allergies, family history, social history with the patient/caregiver today.   ROS  Ten systems reviewed and is negative except as mentioned in HPI   Objective   Vitals:   08/30/18 1158  BP: 120/80  Pulse: 90  Resp: 16  Temp: 99.2 F (37.3 C)  TempSrc: Oral  SpO2: 99%  Weight: 218 lb 8 oz (99.1 kg)  Height: 5\' 3"  (1.6 m)    Body mass index is 38.71 kg/m.  Physical Exam  Constitutional: Patient appears well-developed and well-nourished. Obese  No distress.  HEENT: head atraumatic, normocephalic, pupils equal and reactive to light, neck supple, throat within normal limits Cardiovascular: Normal rate, regular rhythm and normal heart sounds.  No murmur heard. No BLE edema. Pulmonary/Chest: Effort normal and breath sounds normal. No respiratory distress. Abdominal: Soft.  There is no tenderness.CVA negative  Psychiatric: Patient has a normal mood and affect. behavior is normal. Judgment and thought content normal.  Recent Results (from the past August 11, 2158 hour(s))  Cytology - PAP     Status: None   Collection Time: 07/26/18 12:00 AM  Result Value Ref Range   Adequacy      Satisfactory for evaluation  endocervical/transformation zone component ABSENT.   Diagnosis      NEGATIVE FOR INTRAEPITHELIAL LESIONS OR MALIGNANCY. BENIGN REACTIVE CHANGES  INCLUDING PARAKERATOSIS.   Chlamydia Negative     Comment: Normal Reference Range - Negative   Neisseria gonorrhea Negative     Comment: Normal Reference Range - Negative   Trichomonas Negative     Comment: Normal Reference Range - Negative   Material Submitted CervicoVaginal Pap [ThinPrep Imaged]   Cervicovaginal ancillary only     Status: Abnormal   Collection Time: 07/26/18 12:00 AM  Result Value Ref Range   Bacterial vaginitis **POSITIVE for Gardnerella vaginalis** (A)     Comment: Normal Reference Range - Negative  Candida vaginitis Negative for Candida species     Comment: Normal Reference Range - Negative  RPR     Status: None   Collection Time: 07/26/18 11:49 AM  Result Value Ref Range   RPR Ser Ql NON-REACTIVE NON-REACTI  HIV Antibody (routine testing w rflx)     Status: None   Collection Time: 07/26/18 11:49 AM  Result Value Ref Range   HIV 1&2 Ab, 4th Generation NON-REACTIVE NON-REACTI    Comment: HIV-1 antigen and HIV-1/HIV-2 antibodies were not detected. There is no laboratory evidence of HIV infection. Marland Kitchen PLEASE NOTE: This information has been disclosed to you from records whose confidentiality may be protected by state law.  If your state requires such protection, then the state law prohibits you from making any further disclosure of the information without the specific written consent of the person to whom it pertains, or as otherwise permitted by law. A general authorization for the release of medical or other information is NOT sufficient for this purpose. . For additional information please refer to http://education.[facility].com/faq/FAQ106 (This link is being provided for informational/ educational purposes only.) . Marland Kitchen The performance of this assay has not been clinically validated in patients less than 70 years old. .   Hepatitis panel, acute     Status: None   Collection Time: 07/26/18 11:49 AM  Result Value Ref Range   Hep A IgM  NON-REACTIVE NON-REACTI   Hepatitis B Surface Ag NON-REACTIVE NON-REACTI   Hep B C IgM NON-REACTIVE NON-REACTI   Hepatitis C Ab NON-REACTIVE NON-REACTI   SIGNAL TO CUT-OFF 0.19 <1.00    Comment: . HCV antibody was non-reactive. There is no laboratory  evidence of HCV infection. . In most cases, no further action is required. However, if recent HCV exposure is suspected, a test for HCV RNA (test code [id number]) is suggested. . For additional information please refer to http://education.[facility].com/faq/FAQ22v1 (This link is being provided for informational/ educational purposes only.) . Marland Kitchen For additional information, please refer to  http://education.[facility].com/faq/FAQ202  (This link is being provided for informational/ educational purposes only.) .      PHQ2/9: Depression screen [facility] 2/9 08/30/2018 07/26/2018 07/06/2018 05/25/2018 04/05/2018  Decreased Interest 0 0 0 2 0  Down, Depressed, Hopeless 0 0 0 0 0  PHQ - 2 Score 0 0 0 2 0  Altered sleeping 0 0 0 1 0  Tired, decreased energy 0 0 0 2 1  Change in appetite 0 1 0 1 3  Feeling bad or failure about yourself  0 0 2 2 1   Trouble concentrating 1 3 0 0 1  Moving slowly or fidgety/restless 1 0 0 2 0  Suicidal thoughts 0 0 0 0 0  PHQ-9 Score 2 4 2 10 6   Difficult doing work/chores - Not difficult at all Not difficult at all Somewhat difficult -  Some recent data might be hidden    phq 9 is negative  Fall Risk: Fall Risk  08/30/2018 07/22/2018 07/06/2018 05/25/2018 04/05/2018  Falls in the past year? 0 0 0 0 0  Number falls in past yr: 0 0 0 0 0  Injury with Fall? 0 0 0 0 0     Assessment & Plan   1. Routine screening for STI (sexually transmitted infection)  - Cervicovaginal ancillary only  2. Vaginal discharge  - Cervicovaginal ancillary only - clindamycin (CLEOCIN) 2 % vaginal cream; Place 1 Applicatorful vaginally at bedtime.  Dispense: 40 g; Refill: 0

## 2018-08-31 LAB — CERVICOVAGINAL ANCILLARY ONLY
Bacterial vaginitis: POSITIVE — AB
Chlamydia: NEGATIVE
Neisseria Gonorrhea: NEGATIVE
Trichomonas: NEGATIVE

## 2018-09-24 ENCOUNTER — Other Ambulatory Visit: Payer: Self-pay | Admitting: Podiatry

## 2018-09-26 NOTE — Telephone Encounter (Signed)
Patient needs to follow up before any other refills given

## 2018-10-08 ENCOUNTER — Other Ambulatory Visit: Payer: Self-pay | Admitting: Family Medicine

## 2018-10-08 DIAGNOSIS — F330 Major depressive disorder, recurrent, mild: Secondary | ICD-10-CM

## 2018-10-19 ENCOUNTER — Ambulatory Visit (INDEPENDENT_AMBULATORY_CARE_PROVIDER_SITE_OTHER): Admitting: Family Medicine

## 2018-10-19 ENCOUNTER — Encounter: Payer: Self-pay | Admitting: Family Medicine

## 2018-10-19 VITALS — Ht 63.0 in | Wt 223.6 lb

## 2018-10-19 DIAGNOSIS — F330 Major depressive disorder, recurrent, mild: Secondary | ICD-10-CM

## 2018-10-19 DIAGNOSIS — R202 Paresthesia of skin: Secondary | ICD-10-CM

## 2018-10-19 DIAGNOSIS — G43009 Migraine without aura, not intractable, without status migrainosus: Secondary | ICD-10-CM

## 2018-10-19 MED ORDER — SUMATRIPTAN SUCCINATE 100 MG PO TABS: 100.0000 mg | ORAL_TABLET | ORAL | 0 refills | Status: DC | PRN

## 2018-10-19 NOTE — Progress Notes (Signed)
Name: Olivia King   MRN: [id number]    DOB: October 18, 1985   Date:10/19/2018       Progress Note  Subjective  Chief Complaint  Chief Complaint  Patient presents with  . Medication Refill  . Depression  . Migraine    Had not had an episode in a while but had a severe one on Saturday-had to take 4 Excedrin Migraines and a Goody[employer] powder that morning and still didn't go away until later that night.    . Anxiety  . Allergic Rhinitis   . Numbness    Wants a referral for Ortho- bilateral wrist and hands go numb and tingle-wears wrist braces at night with no relief.     I connected with  [name]  on 10/19/18 at  1:40 PM EDT by telephone encounter  and verified that I am speaking with the correct person using two identifiers.  I discussed the limitations of evaluation and management by telemedicine and the availability of in person appointments. The patient expressed understanding and agreed to proceed. Staff also discussed with the patient that there may be a patient responsible charge related to this service. Patient Location: at home  Provider Location: [facility]   HPI  Depression: she has been diagnosed since age 33 yo when husband was deployed when their son was 18 yo. She has another daughter since. Tried multiple therapies. Zoloft made her more emotional, paxil did not work, the CR helped for a while but caused sleep disturbance. She is on Trintelix since 2019 but states it caused blurred vision and hand tingling. She started Lexapro Feb 2020 , she states no recent down days, no side effects. phq 9 improved, continue medications   Seasonal allergic rhinitis: she states had problems as a child, severe last year. She states taking benadryl but waking up with puffy and itchy eyes and also rhinorrhea - clear,and mild dry cough, no wheezing. Unchanged   Migraine headaches: episodes are sporadic and usually takes prn otc medication, however this past episode was  over the weekend and symptoms did not improve quickly,she also had one episode today when she hiked up the mountains today. She states no aura. It is usually temporal either side and sometimes behind her eye, she states in the past it caused amnesia but not lately. She used to get steroid injections at the headache clinic Episode today resolved with Excedrin migraine followed by vomiting. Currently being triggered by heat, sometimes bright lights or strong scents   Paresthesia on both hands: she states symptoms is much worse after working , she states middle fingers and radiates to her arms. She has been wearing a brace at night   Morbid obesity: unable to go to the gym, not moving as much, gained 4 lbs   Patient Active Problem List   Diagnosis Date Noted  . Morbid obesity ([place]) 06/14/2017  . Preventative health care 05/05/2017  . Anxiety 03/24/2017  . IUD contraception 02/06/2016  . Contraception management 01/06/2016  . Glucosuria 09/17/2015  . Major depressive disorder, recurrent ([place]) 08/19/2015  . Benign neoplasm of skin of nose 07/28/2015  . Encounter for smoking cessation counseling 05/13/2015  . Low serum vitamin D 12/24/2014  . Cigarette smoker 12/21/2014  . Migraine with aura 12/21/2014  . Other fatigue 12/21/2014    Past Surgical History:  Procedure Laterality Date  . contraceptive implant      Family History  Problem Relation Age of Onset  . Hypertension Mother   .  Cancer Mother        pancreatic  . Pancreatic cancer Mother   . Hypertension Brother   . Aneurysm Maternal Grandmother   . Heart attack Maternal Grandfather   . Hypertension Paternal Grandmother   . Heart failure Sister     Social History   Socioeconomic History  . Marital status: Widowed    Spouse name: Not on file  . Number of children: 2  . Years of education: Not on file  . Highest education level: Associate degree: occupational, Hotel manager, or vocational program  Occupational History  .  Occupation: Scientist, research (medical)  . Occupation: Ship broker     Comment: [place]  . Financial resource strain: Not hard at all  . Food insecurity    Worry: Never true    Inability: Never true  . Transportation needs    Medical: No    Non-medical: No  Tobacco Use  . Smoking status: Current Every Day Smoker    Packs/day: 0.25    Types: Cigarettes    Start date: 03/05/2011  . Smokeless tobacco: Never Used  Substance and Sexual Activity  . Alcohol use: Yes    Alcohol/week: 0.0 standard drinks  . Drug use: No  . Sexual activity: Not Currently    Partners: Male    Birth control/protection: I.U.D.    Comment: Patient has liletta IUD  Lifestyle  . Physical activity    Days per week: 3 days    Minutes per session: 30 min  . Stress: Rather much  Relationships  . Social connections    Talks on phone: More than three times a week    Gets together: Twice a week    Attends religious service: More than 4 times per year    Active member of club or organization: Yes    Attends meetings of clubs or organizations: More than 4 times per year    Relationship status: Widowed  . Intimate partner violence    Fear of current or ex partner: No    Emotionally abused: No    Physically abused: No    Forced sexual activity: No  Other Topics Concern  . Not on file  Social History Narrative   She is a widow, he died August 02, 2009 while he was deployed.    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  cholecalciferol (VITAMIN D) 1000 units tablet, Take 1,000 Units by mouth daily., Disp: , Rfl:  .  escitalopram (LEXAPRO) 10 MG tablet, TAKE 1 TABLET(10 MG) BY MOUTH DAILY, Disp: 90 tablet, Rfl: 0 .  fluticasone (FLONASE) 50 MCG/ACT nasal spray, Place 2 sprays into both nostrils daily., Disp: 16 g, Rfl: 2 .  hydrOXYzine (ATARAX/VISTARIL) 10 MG tablet, Take 1 tablet (10 mg total) by mouth at bedtime as needed., Disp: 90 tablet, Rfl: 0 .   loratadine (CLARITIN) 10 MG tablet, Take 1 tablet (10 mg total) by mouth daily., Disp: 90 tablet, Rfl: 0 .  meloxicam (MOBIC) 15 MG tablet, TAKE 1 TABLET(15 MG) BY MOUTH DAILY, Disp: 90 tablet, Rfl: 0 .  Multiple Vitamin (MULTI-VITAMINS) TABS, Take by mouth., Disp: , Rfl:  .  olopatadine (PATANOL) 0.1 % ophthalmic solution, Place 1 drop into both eyes 2 (two) times daily., Disp: 5 mL, Rfl: 2 .  phentermine (ADIPEX-P) 37.5 MG tablet, Take 1 tablet by mouth daily., Disp: , Rfl:  .  clindamycin (CLEOCIN) 2 % vaginal cream, Place  1 Applicatorful vaginally at bedtime. (Patient not taking: Reported on 10/19/2018), Disp: 40 g, Rfl: 0 .  triamcinolone cream (KENALOG) 0.1 %, APPLY TO ACTIVE LESIONS EVERY DAY, Disp: , Rfl:   Allergies  Allergen Reactions  . Penicillins Rash  . Trintellix [Vortioxetine]     Tingling hands and blurred vision     I personally reviewed active problem list, medication list, allergies, family history, social history with the patient/caregiver today.   ROS  Ten systems reviewed and is negative except as mentioned in HPI   Objective  Virtual encounter Vitals:   10/19/18 0854  Weight: 223 lb 9.6 oz (101.4 kg)  Height: 5\' 3"  (1.6 m)     Body mass index is 39.61 kg/m.  Physical Exam  Awake, alert and oriented   PHQ2/9: Depression screen [facility] 2/9 10/19/2018 08/30/2018 07/26/2018 07/06/2018 05/25/2018  Decreased Interest 0 0 0 0 2  Down, Depressed, Hopeless 0 0 0 0 0  PHQ - 2 Score 0 0 0 0 2  Altered sleeping 0 0 0 0 1  Tired, decreased energy 1 0 0 0 2  Change in appetite 0 0 1 0 1  Feeling bad or failure about yourself  0 0 0 2 2  Trouble concentrating 0 1 3 0 0  Moving slowly or fidgety/restless 0 1 0 0 2  Suicidal thoughts 0 0 0 0 0  PHQ-9 Score 1 2 4 2 10   Difficult doing work/chores - - Not difficult at all Not difficult at all Somewhat difficult  Some recent data might be hidden   PHQ-2/9 Result is negative.    Fall Risk: Fall Risk  08/30/2018 07/22/2018  07/06/2018 05/25/2018 04/05/2018  Falls in the past year? 0 0 0 0 0  Number falls in past yr: 0 0 0 0 0  Injury with Fall? 0 0 0 0 0     Assessment & Plan  1. Paresthesia of both hands  - Ambulatory referral to Orthopedic Surgery  2. Mild episode of recurrent major depressive disorder (HCC)  Doing well on Lexapro   3. Morbid obesity ([place])  Discussed with the patient the risk posed by an increased BMI. Discussed importance of portion control, calorie counting and at least 150 minutes of physical activity weekly. Avoid sweet beverages and drink more water. Eat at least 6 servings of fruit and vegetables daily   4. Migraine without aura and without status migrainosus, not intractable  - SUMAtriptan (IMITREX) 100 MG tablet; Take 1 tablet (100 mg total) by mouth every 2 (two) hours as needed for migraine. May repeat in 2 hours if headache persists or recurs.  Dispense: 10 tablet; Refill: 0 Discussed possible side effects of medication  I discussed the assessment and treatment plan with the patient. The patient was provided an opportunity to ask questions and all were answered. The patient agreed with the plan and demonstrated an understanding of the instructions.  The patient was advised to call back or seek an in-person evaluation if the symptoms worsen or if the condition fails to improve as anticipated.  I provided 25 minutes of non-face-to-face time during this encounter.

## 2018-10-25 ENCOUNTER — Encounter: Payer: Self-pay | Admitting: Podiatry

## 2018-10-25 ENCOUNTER — Ambulatory Visit (INDEPENDENT_AMBULATORY_CARE_PROVIDER_SITE_OTHER): Admitting: Podiatry

## 2018-10-25 ENCOUNTER — Other Ambulatory Visit: Payer: Self-pay

## 2018-10-25 VITALS — Temp 98.0°F

## 2018-10-25 DIAGNOSIS — M722 Plantar fascial fibromatosis: Secondary | ICD-10-CM

## 2018-10-26 MED ORDER — METHYLPREDNISOLONE 4 MG PO TABS: ORAL_TABLET | ORAL | 0 refills | Status: DC

## 2018-10-27 NOTE — Progress Notes (Signed)
   Subjective: 33 y.o. female presenting today for follow up evaluation of plantar fasciitis of the right foot. She states the pain began about one month ago and is worse in the morning time. She has been taking Meloxicam and using Icy Hot cream with no significant relief. She denies any known trauma or injury. Patient is here for further evaluation and treatment.    Past Medical History:  Diagnosis Date  . Cigarette smoker   . IUD contraception 02/06/2016   Placed January 22, 3902 by Ardeth Perfect, [place]  . Major depressive disorder, recurrent ([place]) 08/19/2015   zoloft made her cry; cymbalta made her too sleepy  . Migraine headache      Objective: Physical Exam General: The patient is alert and oriented x3 in no acute distress.  Dermatology: Skin is warm, dry and supple bilateral lower extremities. Negative for open lesions or macerations bilateral.   Vascular: Dorsalis Pedis and Posterior Tibial pulses palpable bilateral.  Capillary fill time is immediate to all digits.  Neurological: Epicritic and protective threshold intact bilateral.   Musculoskeletal: Tenderness to palpation to the plantar aspect of the right heel along the plantar fascia. All other joints range of motion within normal limits bilateral. Strength 5/5 in all groups bilateral.   Assessment: 1. Plantar fasciitis right  Plan of Care:  1. Patient evaluated. 2. Injection of 0.5cc Celestone soluspan injected into the right plantar fascia  3. Rx for Medrol Dose Pack placed. Then continue taking Meloxicam.  4. Note for work provided to wear good sneakers vs Crocs. 5. Silicone heel sleeve dispensed.  6. Instructed patient regarding therapies and modalities at home to alleviate symptoms.  7. Return to clinic as needed.     Edrick Kins, DPM [facility]  Dr. Edrick Kins, DPM    [address], [place] [address]                Office  [phone]  Fax [phone]

## 2018-11-04 DIAGNOSIS — G5603 Carpal tunnel syndrome, bilateral upper limbs: Secondary | ICD-10-CM | POA: Insufficient documentation

## 2018-11-14 ENCOUNTER — Encounter: Payer: Self-pay | Admitting: Family Medicine

## 2018-11-15 ENCOUNTER — Other Ambulatory Visit: Payer: Self-pay | Admitting: Family Medicine

## 2018-11-15 MED ORDER — NITROFURANTOIN MONOHYD MACRO 100 MG PO CAPS: 100.0000 mg | ORAL_CAPSULE | Freq: Two times a day (BID) | ORAL | 0 refills | Status: DC

## 2018-12-16 ENCOUNTER — Ambulatory Visit (INDEPENDENT_AMBULATORY_CARE_PROVIDER_SITE_OTHER): Admitting: Nurse Practitioner

## 2018-12-16 ENCOUNTER — Other Ambulatory Visit (HOSPITAL_COMMUNITY)
Admission: RE | Admit: 2018-12-16 | Discharge: 2018-12-16 | Disposition: A | Discharge status: Home / Self Care | Source: Ambulatory Visit | Attending: Nurse Practitioner | Admitting: Nurse Practitioner

## 2018-12-16 ENCOUNTER — Encounter: Payer: Self-pay | Admitting: Nurse Practitioner

## 2018-12-16 ENCOUNTER — Other Ambulatory Visit: Payer: Self-pay

## 2018-12-16 VITALS — BP 126/86 | HR 94 | Temp 96.6°F | Resp 14 | Ht 62.0 in | Wt 225.2 lb

## 2018-12-16 DIAGNOSIS — R600 Localized edema: Secondary | ICD-10-CM | POA: Diagnosis not present

## 2018-12-16 DIAGNOSIS — N898 Other specified noninflammatory disorders of vagina: Secondary | ICD-10-CM | POA: Diagnosis present

## 2018-12-16 DIAGNOSIS — G43009 Migraine without aura, not intractable, without status migrainosus: Secondary | ICD-10-CM

## 2018-12-16 MED ORDER — BUTALBITAL-APAP-CAFFEINE 50-325-40 MG PO TABS: 1.0000 | ORAL_TABLET | Freq: Two times a day (BID) | ORAL | 0 refills | Status: AC | PRN

## 2018-12-16 MED ORDER — FUROSEMIDE 20 MG PO TABS: 20.0000 mg | ORAL_TABLET | Freq: Every day | ORAL | 0 refills | Status: DC | PRN

## 2018-12-16 NOTE — Progress Notes (Signed)
Name: Olivia King   MRN: [id number]    DOB: [birth year]   Date:12/16/2018       Progress Note  Subjective  Chief Complaint  Chief Complaint  Patient presents with  . bacterial infection  . Edema    legs and ankles    HPI  2 days ago noticed foul vaginal odor, no discharge, vaginal bleeding, irritation or itching. Has had bacterial vaginosis before. Is sexually active with one partner- monogamous relationship.  Patient endorses bilateral edema in ankles and ankles States she switched from mirena to Amo- due to increased fluid retention. 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   Migraines gets worse in the summer. States sumatriptan caused allergic reaction, has tried topamax preventative and another pill that was red and white that she does not recall name off. Gets migraines about daily in the summer.   PHQ2/9: Depression screen [facility] 2/9 12/16/2018 10/19/2018 08/30/2018 07/26/2018 07/06/2018  Decreased Interest 0 0 0 0 0  Down, Depressed, Hopeless 0 0 0 0 0  PHQ - 2 Score 0 0 0 0 0  Altered sleeping 0 0 0 0 0  Tired, decreased energy 0 1 0 0 0  Change in appetite 0 0 0 1 0  Feeling bad or failure about yourself  0 0 0 0 2  Trouble concentrating 0 0 1 3 0  Moving slowly or fidgety/restless 0 0 1 0 0  Suicidal thoughts 0 0 0 0 0  PHQ-9 Score 0 1 2 4 2   Difficult doing work/chores Not difficult at all - - Not difficult at all Not difficult at all  Some recent data might be hidden    PHQ reviewed. Negative  Patient Active Problem List   Diagnosis Date Noted  . Bilateral carpal tunnel syndrome 11/04/2018  . Morbid obesity ([place]) 06/14/2017  . Preventative health care 05/05/2017  . Anxiety 03/24/2017  . IUD contraception 02/06/2016  . Contraception management 01/06/2016  . Glucosuria 09/17/2015   . Major depressive disorder, recurrent ([place]) 08/19/2015  . Benign neoplasm of skin of nose 07/28/2015  . Encounter for smoking cessation counseling 05/13/2015  . Low serum vitamin D 12/24/2014  . Cigarette smoker 12/21/2014  . Migraine with aura 12/21/2014  . Other fatigue 12/21/2014    Past Medical History:  Diagnosis Date  . Cigarette smoker   . IUD contraception 02/06/2016   Placed October 5, 0000000 by Ardeth Perfect, [place]  . Major depressive disorder, recurrent ([place]) 08/19/2015   zoloft made her cry; cymbalta made her too sleepy  . Migraine headache     Past Surgical History:  Procedure Laterality Date  . contraceptive implant      Social History   Tobacco Use  . Smoking status: Current Every Day Smoker    Packs/day: 0.25    Types: Cigarettes    Start date: 03/05/2011  . Smokeless tobacco: Never Used  Substance Use Topics  . Alcohol use: Yes    Alcohol/week: 0.0 standard drinks     Current Outpatient Medications:  .  cholecalciferol (VITAMIN D) 1000 units tablet, Take 1,000 Units by mouth daily., Disp: , Rfl:  .  escitalopram (LEXAPRO) 10 MG tablet, TAKE 1 TABLET(10 MG) BY MOUTH DAILY, Disp: 90 tablet, Rfl: 0 .  fluticasone (FLONASE) 50 MCG/ACT nasal spray, Place 2  sprays into both nostrils daily., Disp: 16 g, Rfl: 2 .  hydrOXYzine (ATARAX/VISTARIL) 10 MG tablet, Take 1 tablet (10 mg total) by mouth at bedtime as needed., Disp: 90 tablet, Rfl: 0 .  loratadine (CLARITIN) 10 MG tablet, Take 1 tablet (10 mg total) by mouth daily., Disp: 90 tablet, Rfl: 0 .  meloxicam (MOBIC) 15 MG tablet, TAKE 1 TABLET(15 MG) BY MOUTH DAILY, Disp: 90 tablet, Rfl: 0 .  Multiple Vitamin (MULTI-VITAMINS) TABS, Take by mouth., Disp: , Rfl:  .  olopatadine (PATANOL) 0.1 % ophthalmic solution, Place 1 drop into both eyes 2 (two) times daily., Disp: 5 mL, Rfl: 2 .  phentermine (ADIPEX-P) 37.5 MG tablet, Take 1 tablet by mouth daily., Disp: , Rfl:  .  triamcinolone cream (KENALOG)  0.1 %, APPLY TO ACTIVE LESIONS EVERY DAY, Disp: , Rfl:  .  methylPREDNISolone (MEDROL) 4 MG tablet, Take as directed (Patient not taking: Reported on 12/16/2018), Disp: 21 tablet, Rfl: 0 .  nitrofurantoin, macrocrystal-monohydrate, (MACROBID) 100 MG capsule, Take 1 capsule (100 mg total) by mouth 2 (two) times daily. (Patient not taking: Reported on 12/16/2018), Disp: 10 capsule, Rfl: 0 .  SUMAtriptan (IMITREX) 100 MG tablet, Take 1 tablet (100 mg total) by mouth every 2 (two) hours as needed for migraine. May repeat in 2 hours if headache persists or recurs. (Patient not taking: Reported on 12/16/2018), Disp: 10 tablet, Rfl: 0  Allergies  Allergen Reactions  . Penicillins Rash  . Imitrex [Sumatriptan]   . Trintellix [Vortioxetine]     Tingling hands and blurred vision     ROS    No other specific complaints in a complete review of systems (except as listed in HPI above).  Objective  Vitals:   12/16/18 1109  BP: 126/86  Pulse: 94  Resp: 14  Temp: (!) 96.6 F (35.9 C)  SpO2: 99%  Weight: 225 lb 3.2 oz (102.2 kg)  Height: 5\' 2"  (1.575 m)     Body mass index is 41.19 kg/m.  Nursing Note and Vital Signs reviewed.  Physical Exam   Constitutional: Patient appears well-developed and well-nourished. Obese. No distress.  HEENT: head atraumatic, normocephalic, conjunctive clear Cardiovascular: Normal rate,  No BLE edema. Pulmonary/Chest: Effort normall. No respiratory distress. Skin: no concerning rashes or bruising  Psychiatric: Patient has a normal mood and affect. behavior is normal. Judgment and thought content normal.   Hands off exam due to pandemic and no acute concerns requiring more detailed physical exam.     No results found for this or any previous visit (from the past 48 hour(s)).  Assessment & Plan  1. Vaginal odor - Cervicovaginal ancillary only  2. Bilateral lower extremity edema Discussed importance of weight loss, compression stockings and low sodium  diet. rx lasix as she has been on it before 30 tablets are to be 6 month supply.  - furosemide (LASIX) 20 MG tablet; Take 1 tablet (20 mg total) by mouth daily as needed for fluid or edema.  Dispense: 30 tablet; Refill: 0  3. Migraine without aura and without status migrainosus, not intractable 3 month supply, if having more frequently consider preventative- discussed ajovy, emgality and amovig.  - butalbital-acetaminophen-caffeine (FIORICET) 50-325-40 MG tablet; Take 1-2 tablets by mouth every 12 (twelve) hours as needed for headache. No more than 3 in one day.  Dispense: 10 tablet; Refill: 0

## 2018-12-16 NOTE — Patient Instructions (Addendum)
-   Look up migraine preventives: ajovy, emgality and amovig and  Peripheral Edema  Peripheral edema is swelling that is caused by a buildup of fluid. Peripheral edema most often affects the lower legs, ankles, and feet. It can also develop in the arms, hands, and face. The area of the body that has peripheral edema will look swollen. It may also feel heavy or warm. Your clothes may start to feel tight. Pressing on the area may make a temporary dent in your skin. You may not be able to move your swollen arm or leg as much as usual. There are many causes of peripheral edema. It can happen because of a complication of other conditions such as congestive heart failure, kidney disease, or a problem with your blood circulation. It also can be a side effect of certain medicines or because of an infection. It often happens to women during pregnancy. Sometimes, the cause is not known. Follow these instructions at home: Managing pain, stiffness, and swelling   Raise (elevate) your legs while you are sitting or lying down.  Move around often to prevent stiffness and to lessen swelling.  Do not sit or stand for long periods of time.  Wear support stockings as told by your health care provider. Medicines  Take over-the-counter and prescription medicines only as told by your health care provider.  Your health care provider may prescribe medicine to help your body get rid of excess water (diuretic). General instructions  Pay attention to any changes in your symptoms.  Follow instructions from your health care provider about limiting salt (sodium) in your diet. Sometimes, eating less salt may reduce swelling.  Moisturize skin daily to help prevent skin from cracking and draining.  Keep all follow-up visits as told by your health care provider. This is important. Contact a health care provider if you have:  A fever.  Edema that starts suddenly or is getting worse, especially if you are pregnant or  have a medical condition.  Swelling in only one leg.  Increased swelling, redness, or pain in one or both of your legs.  Drainage or sores at the area where you have edema. Get help right away if you:  Develop shortness of breath, especially when you are lying down.  Have pain in your chest or abdomen.  Feel weak.  Feel faint. Summary  Peripheral edema is swelling that is caused by a buildup of fluid. Peripheral edema most often affects the lower legs, ankles, and feet.  Move around often to prevent stiffness and to lessen swelling. Do not sit or stand for long periods of time.  Pay attention to any changes in your symptoms.  Contact a health care provider if you have edema that starts suddenly or is getting worse, especially if you are pregnant or have a medical condition.  Get help right away if you develop shortness of breath, especially when lying down. This information is not intended to replace advice given to you by your health care provider. Make sure you discuss any questions you have with your health care provider. Document Released: 05/14/2004 Document Revised: 12/29/2017 Document Reviewed: 12/29/2017 Elsevier Patient Education  2020 [employer].

## 2018-12-21 LAB — CERVICOVAGINAL ANCILLARY ONLY
Candida vaginitis: NEGATIVE
Chlamydia: NEGATIVE
Neisseria Gonorrhea: NEGATIVE
Trichomonas: NEGATIVE

## 2018-12-22 ENCOUNTER — Other Ambulatory Visit: Payer: Self-pay | Admitting: Nurse Practitioner

## 2018-12-22 MED ORDER — METRONIDAZOLE 500 MG PO TABS: 500.0000 mg | ORAL_TABLET | Freq: Two times a day (BID) | ORAL | 0 refills | Status: DC

## 2019-01-25 ENCOUNTER — Ambulatory Visit (INDEPENDENT_AMBULATORY_CARE_PROVIDER_SITE_OTHER): Admitting: Podiatry

## 2019-01-25 ENCOUNTER — Other Ambulatory Visit: Payer: Self-pay

## 2019-01-25 ENCOUNTER — Encounter: Payer: Self-pay | Admitting: Podiatry

## 2019-01-25 DIAGNOSIS — M722 Plantar fascial fibromatosis: Secondary | ICD-10-CM

## 2019-01-25 MED ORDER — METHYLPREDNISOLONE 4 MG PO TBPK: ORAL_TABLET | ORAL | 0 refills | Status: DC

## 2019-01-25 NOTE — Progress Notes (Signed)
She presents today states that she had a flareup of her plantar fasciitis of her left foot.  States that she is also lost her plantar fascial brace and that she would like to use another dose of Medrol.  Objective: Vital signs are stable alert and oriented x3.  Pulses are palpable.  She has pain on palpation medial calcaneal tubercle of the left heel.  She also has no erythema edema cellulitis drainage or odor.  Assessment: Chronic intractable plantar fasciitis left.  Plan: Start her on a Medrol Dosepak to be followed by her meloxicam.  Redistributed it a plantar fascial brace and injected the left heel.  We injected 20 mg Kenalog 5 mg Marcaine point maximal tenderness left heel.  Follow-up with her in 1 month.  She will follow-up with Liliane Channel for orthotics.

## 2019-01-25 NOTE — Patient Instructions (Addendum)

## 2019-02-08 ENCOUNTER — Other Ambulatory Visit: Admitting: Orthotics

## 2019-02-15 ENCOUNTER — Other Ambulatory Visit: Payer: Self-pay

## 2019-02-15 ENCOUNTER — Encounter: Payer: Self-pay | Admitting: Family Medicine

## 2019-02-15 ENCOUNTER — Ambulatory Visit (INDEPENDENT_AMBULATORY_CARE_PROVIDER_SITE_OTHER): Admitting: Family Medicine

## 2019-02-15 VITALS — BP 136/86 | HR 98 | Temp 97.3°F | Resp 16 | Ht 63.0 in | Wt 229.3 lb

## 2019-02-15 DIAGNOSIS — Z23 Encounter for immunization: Secondary | ICD-10-CM | POA: Diagnosis not present

## 2019-02-15 DIAGNOSIS — F331 Major depressive disorder, recurrent, moderate: Secondary | ICD-10-CM

## 2019-02-15 DIAGNOSIS — F419 Anxiety disorder, unspecified: Secondary | ICD-10-CM

## 2019-02-15 DIAGNOSIS — G43009 Migraine without aura, not intractable, without status migrainosus: Secondary | ICD-10-CM | POA: Diagnosis not present

## 2019-02-15 MED ORDER — HYDROXYZINE HCL 10 MG PO TABS: 10.0000 mg | ORAL_TABLET | Freq: Every evening | ORAL | 0 refills | Status: DC | PRN

## 2019-02-15 MED ORDER — ESCITALOPRAM OXALATE 10 MG PO TABS: 10.0000 mg | ORAL_TABLET | Freq: Every day | ORAL | 0 refills | Status: DC

## 2019-02-15 MED ORDER — ARIPIPRAZOLE 2 MG PO TABS: 2.0000 mg | ORAL_TABLET | Freq: Every day | ORAL | 0 refills | Status: DC

## 2019-02-15 NOTE — Progress Notes (Signed)
Name: Olivia King   MRN: [id number]    DOB: December 07, 1985   Date:02/15/2019       Progress Note  Subjective  Chief Complaint  Chief Complaint  Patient presents with  . Anxiety  . Depression  . Migraine  . Follow-up    HPI  Depression: she has been diagnosed since age 33 yo when husband was deployed when their son was 17 yo. She has another daughter since. Tried multiple therapies. Zoloft made her more emotional, paxil did not work, the CR helped for a while but caused sleep disturbance. She is on Trintelix since 2019 but states it caused blurred vision and hand tingling. Shestarted Lexapro Feb 2020 , feeling worse lately, losing her temper when she gets at home, working as a Technical brewer but very stressed, not a healthy environment . Phq 9 is higher, having anger outburst, we will try adding Abilify   Migraine headaches: episodes are sporadic and usually takes prn otc medication, however this past episode was over the weekend and symptoms did not improve quickly,she also had one episode today when she hiked up the mountains today. She states no aura. It is usually temporal either side and sometimes behind her eye, she states in the past it caused amnesia but not lately. She used to get steroid injections at the headache clinic Episode today resolv ed with Excedrin migraine followed by vomiting. She has some fioricet at home, could not tolerate Triptans   Morbid obesity: unable to go to the gym but she will resume this week , she is more stressed, working long hours  Carpal Tunnel: had steroid injections at [facility] on both wrists and improve symptoms , advised to wear braces at night   Patient Active Problem List   Diagnosis Date Noted  . Bilateral carpal tunnel syndrome 11/04/2018  . Morbid obesity ([place]) 06/14/2017  . Anxiety 03/24/2017  . Contraception management 01/06/2016  . Glucosuria 09/17/2015  . Major depressive disorder, recurrent ([place]) 08/19/2015  . Benign neoplasm of skin  of nose 07/28/2015  . Low serum vitamin D 12/24/2014  . Migraine with aura 12/21/2014    Past Surgical History:  Procedure Laterality Date  . contraceptive implant      Family History  Problem Relation Age of Onset  . Hypertension Mother   . Cancer Mother        pancreatic  . Pancreatic cancer Mother   . Hypertension Brother   . Aneurysm Maternal Grandmother   . Heart attack Maternal Grandfather   . Hypertension Paternal Grandmother   . Heart failure Sister     Social History   Socioeconomic History  . Marital status: Widowed    Spouse name: Not on file  . Number of children: 2  . Years of education: Not on file  . Highest education level: Associate degree: occupational, Hotel manager, or vocational program  Occupational History  . Occupation: Scientist, research (medical)  . Occupation: Ship broker     Comment: [place]  . Financial resource strain: Not hard at all  . Food insecurity    Worry: Never true    Inability: Never true  . Transportation needs    Medical: No    Non-medical: No  Tobacco Use  . Smoking status: Current Every Day Smoker    Packs/day: 0.25    Types: Cigarettes    Start date: 03/05/2011  . Smokeless tobacco: Never Used  Substance and Sexual Activity  . Alcohol use: Yes    Alcohol/week: 0.0 standard drinks  .  Drug use: No  . Sexual activity: Not Currently    Partners: Male    Birth control/protection: I.U.D.    Comment: Patient has liletta IUD  Lifestyle  . Physical activity    Days per week: 3 days    Minutes per session: 30 min  . Stress: Rather much  Relationships  . Social connections    Talks on phone: More than three times a week    Gets together: Twice a week    Attends religious service: More than 4 times per year    Active member of club or organization: Yes    Attends meetings of clubs or organizations: More than 4 times per year    Relationship status: Widowed  . Intimate partner violence    Fear of current or ex partner: No     Emotionally abused: No    Physically abused: No    Forced sexual activity: No  Other Topics Concern  . Not on file  Social History Narrative   She is a widow, he died Aug 04, 2009 while he was deployed.    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  butalbital-acetaminophen-caffeine (FIORICET) 50-325-40 MG tablet, Take 1-2 tablets by mouth every 12 (twelve) hours as needed for headache. No more than 3 in one day., Disp: 10 tablet, Rfl: 0 .  cholecalciferol (VITAMIN D) 1000 units tablet, Take 1,000 Units by mouth daily., Disp: , Rfl:  .  escitalopram (LEXAPRO) 10 MG tablet, Take 1 tablet (10 mg total) by mouth daily., Disp: 90 tablet, Rfl: 0 .  fluticasone (FLONASE) 50 MCG/ACT nasal spray, Place 2 sprays into both nostrils daily., Disp: 16 g, Rfl: 2 .  furosemide (LASIX) 20 MG tablet, Take 1 tablet (20 mg total) by mouth daily as needed for fluid or edema., Disp: 30 tablet, Rfl: 0 .  hydrOXYzine (ATARAX/VISTARIL) 10 MG tablet, Take 1 tablet (10 mg total) by mouth at bedtime as needed., Disp: 90 tablet, Rfl: 0 .  loratadine (CLARITIN) 10 MG tablet, Take 1 tablet (10 mg total) by mouth daily., Disp: 90 tablet, Rfl: 0 .  meloxicam (MOBIC) 15 MG tablet, TAKE 1 TABLET(15 MG) BY MOUTH DAILY, Disp: 90 tablet, Rfl: 0 .  Multiple Vitamin (MULTI-VITAMINS) TABS, Take by mouth., Disp: , Rfl:  .  olopatadine (PATANOL) 0.1 % ophthalmic solution, Place 1 drop into both eyes 2 (two) times daily., Disp: 5 mL, Rfl: 2 .  phentermine (ADIPEX-P) 37.5 MG tablet, Take 1 tablet by mouth daily., Disp: , Rfl:  .  ARIPiprazole (ABILIFY) 2 MG tablet, Take 1 tablet (2 mg total) by mouth daily., Disp: 90 tablet, Rfl: 0 .  triamcinolone cream (KENALOG) 0.1 %, APPLY TO ACTIVE LESIONS EVERY DAY, Disp: , Rfl:   Allergies  Allergen Reactions  . Imitrex [Sumatriptan] Anaphylaxis    Throat and tongue swelling   . Penicillins Rash  . Trintellix  [Vortioxetine]     Tingling hands and blurred vision     I personally reviewed active problem list, medication list, allergies, family history, social history, health maintenance with the patient/caregiver today.   ROS  Constitutional: Negative for fever or weight change.  Respiratory: Negative for cough and shortness of breath.   Cardiovascular: Negative for chest pain or palpitations.  Gastrointestinal: Negative for abdominal pain, no bowel changes.  Musculoskeletal: Negative for gait problem or joint swelling.  Skin: Negative for rash.  Neurological: Negative for dizziness  or headache.  No other specific complaints in a complete review of systems (except as listed in HPI above).   Objective  Vitals:   02/15/19 1006  BP: 136/86  Pulse: 98  Resp: 16  Temp: (!) 97.3 F (36.3 C)  TempSrc: Temporal  SpO2: 98%  Weight: 229 lb 4.8 oz (104 kg)  Height: 5\' 3"  (1.6 m)    Body mass index is 40.62 kg/m.  Physical Exam  Constitutional: Patient appears well-developed and well-nourished. Obese  No distress.  HEENT: head atraumatic, normocephalic, pupils equal and reactive to light Cardiovascular: Normal rate, regular rhythm and normal heart sounds.  No murmur heard. No BLE edema. Pulmonary/Chest: Effort normal and breath sounds normal. No respiratory distress. Abdominal: Soft.  There is no tenderness. Psychiatric: Patient has a normal mood and affect. behavior is normal. Judgment and thought content normal.  Recent Results (from the past 2160 hour(s))  Cervicovaginal ancillary only     Status: Abnormal   Collection Time: 12/16/18 12:00 AM  Result Value Ref Range   Bacterial vaginitis (A)     **POSITIVE for Atopobium vaginae, POSITIVE for Megasphaera 1, POSITIVE for BVAB2**    Comment: Normal Reference Range - Negative   Candida vaginitis Negative for Candida Vaginitis Microorganisms     Comment: Normal Reference Range - Negative   Chlamydia Negative     Comment: Normal  Reference Range - Negative   Neisseria Gonorrhea Negative     Comment: Normal Reference Range - Negative   Trichomonas Negative     Comment: Normal Reference Range - Negative      PHQ2/9: Depression screen [facility] 2/9 02/15/2019 12/16/2018 10/19/2018 08/30/2018 07/26/2018  Decreased Interest 3 0 0 0 0  Down, Depressed, Hopeless 1 0 0 0 0  PHQ - 2 Score 4 0 0 0 0  Altered sleeping 0 0 0 0 0  Tired, decreased energy 1 0 1 0 0  Change in appetite 0 0 0 0 1  Feeling bad or failure about yourself  2 0 0 0 0  Trouble concentrating 3 0 0 1 3  Moving slowly or fidgety/restless 3 0 0 1 0  Suicidal thoughts 0 0 0 0 0  PHQ-9 Score 13 0 1 2 4   Difficult doing work/chores Somewhat difficult Not difficult at all - - Not difficult at all  Some recent data might be hidden    phq 9 is positive   Fall Risk: Fall Risk  02/15/2019 12/16/2018 08/30/2018 07/22/2018 07/06/2018  Falls in the past year? 0 0 0 0 0  Number falls in past yr: 0 0 0 0 0  Injury with Fall? 0 0 0 0 0     Functional Status Survey: Is the patient deaf or have difficulty hearing?: No Does the patient have difficulty seeing, even when wearing glasses/contacts?: No Does the patient have difficulty concentrating, remembering, or making decisions?: No Does the patient have difficulty walking or climbing stairs?: No Does the patient have difficulty dressing or bathing?: No Does the patient have difficulty doing errands alone such as visiting a doctor's office or shopping?: No    Assessment & Plan  1. Moderate episode of recurrent major depressive disorder (HCC)  - escitalopram (LEXAPRO) 10 MG tablet; Take 1 tablet (10 mg total) by mouth daily.  Dispense: 90 tablet; Refill: 0 - ARIPiprazole (ABILIFY) 2 MG tablet; Take 1 tablet (2 mg total) by mouth daily.  Dispense: 90 tablet; Refill: 0  2. Morbid obesity ([place])  Discussed with the patient  the risk posed by an increased BMI. Discussed importance of portion control, calorie counting  and at least 150 minutes of physical activity weekly. Avoid sweet beverages and drink more water. Eat at least 6 servings of fruit and vegetables daily   3. Need for immunization against influenza  - Flu Vaccine QUAD 36+ mos IM  4. Migraine without aura and without status migrainosus, not intractable  No recent episodes   5. Anxiety  - hydrOXYzine (ATARAX/VISTARIL) 10 MG tablet; Take 1 tablet (10 mg total) by mouth at bedtime as needed.  Dispense: 90 tablet; Refill: 0

## 2019-02-22 ENCOUNTER — Ambulatory Visit (INDEPENDENT_AMBULATORY_CARE_PROVIDER_SITE_OTHER): Payer: Self-pay | Admitting: Orthotics

## 2019-02-22 ENCOUNTER — Other Ambulatory Visit: Payer: Self-pay

## 2019-02-22 DIAGNOSIS — M722 Plantar fascial fibromatosis: Secondary | ICD-10-CM

## 2019-02-22 NOTE — Progress Notes (Signed)

## 2019-03-21 DIAGNOSIS — U071 COVID-19: Secondary | ICD-10-CM

## 2019-03-21 HISTORY — DX: COVID-19: U07.1

## 2019-03-22 ENCOUNTER — Other Ambulatory Visit: Payer: Self-pay

## 2019-03-22 ENCOUNTER — Other Ambulatory Visit: Admitting: Orthotics

## 2019-03-22 DIAGNOSIS — Z20822 Contact with and (suspected) exposure to covid-19: Secondary | ICD-10-CM

## 2019-03-24 LAB — NOVEL CORONAVIRUS, NAA: SARS-CoV-2, NAA: DETECTED — AB

## 2019-03-29 ENCOUNTER — Other Ambulatory Visit: Payer: Self-pay

## 2019-03-29 ENCOUNTER — Encounter: Payer: Self-pay | Admitting: Family Medicine

## 2019-03-29 ENCOUNTER — Ambulatory Visit (INDEPENDENT_AMBULATORY_CARE_PROVIDER_SITE_OTHER): Admitting: Family Medicine

## 2019-03-29 DIAGNOSIS — L0293 Carbuncle, unspecified: Secondary | ICD-10-CM | POA: Diagnosis not present

## 2019-03-29 DIAGNOSIS — F325 Major depressive disorder, single episode, in full remission: Secondary | ICD-10-CM | POA: Diagnosis not present

## 2019-03-29 DIAGNOSIS — R600 Localized edema: Secondary | ICD-10-CM

## 2019-03-29 MED ORDER — MUPIROCIN 2 % EX OINT: 1.0000 "application " | TOPICAL_OINTMENT | Freq: Two times a day (BID) | CUTANEOUS | 0 refills | Status: DC

## 2019-03-29 NOTE — Progress Notes (Signed)
Name: Olivia King   MRN: [id number]    DOB: 09-08-1985   Date:03/29/2019       Progress Note  Subjective  Chief Complaint  Chief Complaint  Patient presents with  . Depression  . Recurrent Skin Infections    she keeps getting boils. She want to know what she can do to prevent getting boils.  . Leg Swelling    I connected with  [name]  on 03/29/19 at  9:40 AM EST by a video enabled telemedicine application and verified that I am speaking with the correct person using two identifiers.  I discussed the limitations of evaluation and management by telemedicine and the availability of in person appointments. The patient expressed understanding and agreed to proceed. Staff also discussed with the patient that there may be a patient responsible charge related to this service. Patient Location: at home  Provider Location: [facility]   HPI  Depression: she has been diagnosed since age 23 yo when husband was deployed when their son was 87 yo. She has another daughter since. Tried multiple therapies. Zoloft made her more emotional, paxil did not work, the CR helped for a while but caused sleep disturbance. She was on  Trintelix since 2019 but states it caused blurred vision and hand tingling. Shestarted Lexapro Feb 2020, was feeling worse in the Fall on 2020 , she had noticed that she was losing her  temper at home, and having anger outburst , we added Abilify 2 mg and she is doing very well since.   Leg Edema: she states she has been home since last week because of COVID-19 and was still had swelling on both legs. She takes lasix prn. She states it is pitting edema. Sometimes when she has leg swelling she gets SOB and takes a lasix. She has been wearing compression stocking hoses   Recurrent boils: discussed baths with 1/4 cup of clorox bleach weekly, washing sheets weekly, wiping counter tops with bleach solution and consider Mupirocin in her nostrils or at least  get it swabbed and tested for staph . There is a family history of hydradenitis. She states , under breast, inner thighs and buttocks sometimes .   Patient Active Problem List   Diagnosis Date Noted  . Bilateral carpal tunnel syndrome 11/04/2018  . Morbid obesity ([place]) 06/14/2017  . Anxiety 03/24/2017  . Contraception management 01/06/2016  . Glucosuria 09/17/2015  . Major depressive disorder, recurrent ([place]) 08/19/2015  . Benign neoplasm of skin of nose 07/28/2015  . Low serum vitamin D 12/24/2014  . Migraine with aura 12/21/2014    Past Surgical History:  Procedure Laterality Date  . contraceptive implant      Family History  Problem Relation Age of Onset  . Hypertension Mother   . Cancer Mother        pancreatic  . Pancreatic cancer Mother   . Hypertension Brother   . Aneurysm Maternal Grandmother   . Heart attack Maternal Grandfather   . Hypertension Paternal Grandmother   . Heart failure Sister     Social History   Socioeconomic History  . Marital status: Widowed    Spouse name: Not on file  . Number of children: 2  . Years of education: Not on file  . Highest education level: Associate degree: occupational, Hotel manager, or vocational program  Occupational History  . Occupation: CMA    Comment: [place]  . Financial resource strain: Not hard at all  . Food  insecurity    Worry: Never true    Inability: Never true  . Transportation needs    Medical: No    Non-medical: No  Tobacco Use  . Smoking status: Current Every Day Smoker    Packs/day: 0.75    Types: Cigarettes    Start date: 03/05/2011  . Smokeless tobacco: Never Used  Substance and Sexual Activity  . Alcohol use: Yes    Alcohol/week: 0.0 standard drinks  . Drug use: No  . Sexual activity: Yes    Partners: Male    Birth control/protection: I.U.D.    Comment: Patient has liletta IUD  Lifestyle  . Physical activity    Days per week: 3 days    Minutes per session: 30 min  .  Stress: Rather much  Relationships  . Social connections    Talks on phone: More than three times a week    Gets together: Twice a week    Attends religious service: More than 4 times per year    Active member of club or organization: Yes    Attends meetings of clubs or organizations: More than 4 times per year    Relationship status: Widowed  . Intimate partner violence    Fear of current or ex partner: No    Emotionally abused: No    Physically abused: No    Forced sexual activity: No  Other Topics Concern  . Not on file  Social History Narrative   She is a widow, he died 08-10-2009 while he was deployed.    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  ARIPiprazole (ABILIFY) 2 MG tablet, Take 1 tablet (2 mg total) by mouth daily., Disp: 90 tablet, Rfl: 0 .  butalbital-acetaminophen-caffeine (FIORICET) 50-325-40 MG tablet, Take 1-2 tablets by mouth every 12 (twelve) hours as needed for headache. No more than 3 in one day., Disp: 10 tablet, Rfl: 0 .  cholecalciferol (VITAMIN D) 1000 units tablet, Take 1,000 Units by mouth daily., Disp: , Rfl:  .  escitalopram (LEXAPRO) 10 MG tablet, Take 1 tablet (10 mg total) by mouth daily., Disp: 90 tablet, Rfl: 0 .  fluticasone (FLONASE) 50 MCG/ACT nasal spray, Place 2 sprays into both nostrils daily., Disp: 16 g, Rfl: 2 .  furosemide (LASIX) 20 MG tablet, Take 1 tablet (20 mg total) by mouth daily as needed for fluid or edema., Disp: 30 tablet, Rfl: 0 .  hydrOXYzine (ATARAX/VISTARIL) 10 MG tablet, Take 1 tablet (10 mg total) by mouth at bedtime as needed., Disp: 90 tablet, Rfl: 0 .  loratadine (CLARITIN) 10 MG tablet, Take 1 tablet (10 mg total) by mouth daily., Disp: 90 tablet, Rfl: 0 .  meloxicam (MOBIC) 15 MG tablet, TAKE 1 TABLET(15 MG) BY MOUTH DAILY, Disp: 90 tablet, Rfl: 0 .  Multiple Vitamin (MULTI-VITAMINS) TABS, Take by mouth., Disp: , Rfl:  .  olopatadine (PATANOL) 0.1 % ophthalmic solution, Place 1 drop  into both eyes 2 (two) times daily., Disp: 5 mL, Rfl: 2 .  phentermine (ADIPEX-P) 37.5 MG tablet, Take 1 tablet by mouth daily., Disp: , Rfl:  .  triamcinolone cream (KENALOG) 0.1 %, APPLY TO ACTIVE LESIONS EVERY DAY, Disp: , Rfl:  .  mupirocin ointment (BACTROBAN) 2 %, Place 1 application into the nose 2 (two) times daily., Disp: 22 g, Rfl: 0  Allergies  Allergen Reactions  . Imitrex [Sumatriptan] Anaphylaxis    Throat and tongue swelling   . Penicillins Rash  .  Trintellix [Vortioxetine]     Tingling hands and blurred vision     I personally reviewed active problem list, medication list, allergies, family history, social history, health maintenance with the patient/caregiver today.   ROS  Ten systems reviewed and is negative except as mentioned in HPI   Objective  Virtual encounter, vitals not obtained.  There is no height or weight on file to calculate BMI.  Physical Exam  Awake, alert and oriented  PHQ2/9: Depression screen [facility] 2/9 03/29/2019 02/15/2019 12/16/2018 10/19/2018 08/30/2018  Decreased Interest 0 3 0 0 0  Down, Depressed, Hopeless 0 1 0 0 0  PHQ - 2 Score 0 4 0 0 0  Altered sleeping 0 0 0 0 0  Tired, decreased energy 0 1 0 1 0  Change in appetite 1 0 0 0 0  Feeling bad or failure about yourself  0 2 0 0 0  Trouble concentrating 0 3 0 0 1  Moving slowly or fidgety/restless 0 3 0 0 1  Suicidal thoughts 0 0 0 0 0  PHQ-9 Score 1 13 0 1 2  Difficult doing work/chores Not difficult at all Somewhat difficult Not difficult at all - -  Some recent data might be hidden   PHQ-2/9 Result is negative.    Fall Risk: Fall Risk  02/15/2019 12/16/2018 08/30/2018 07/22/2018 07/06/2018  Falls in the past year? 0 0 0 0 0  Number falls in past yr: 0 0 0 0 0  Injury with Fall? 0 0 0 0 0     Assessment & Plan  1. Recurrent boils  - mupirocin ointment (BACTROBAN) 2 %; Place 1 application into the nose 2 (two) times daily.  Dispense: 22 g; Refill: 0  2. Bilateral lower  extremity edema  - Ambulatory referral to Vascular Surgery   3. Major depression in remission [facility])  Doing well on current meds    I discussed the assessment and treatment plan with the patient. The patient was provided an opportunity to ask questions and all were answered. The patient agreed with the plan and demonstrated an understanding of the instructions.  The patient was advised to call back or seek an in-person evaluation if the symptoms worsen or if the condition fails to improve as anticipated.  I provided 25 minutes of non-face-to-face time during this encounter.

## 2019-04-05 ENCOUNTER — Ambulatory Visit: Admitting: Orthotics

## 2019-04-05 ENCOUNTER — Other Ambulatory Visit: Payer: Self-pay

## 2019-04-05 DIAGNOSIS — M722 Plantar fascial fibromatosis: Secondary | ICD-10-CM

## 2019-04-05 NOTE — Progress Notes (Signed)
Patient came in today to pick up custom made foot orthotics.  The goals were accomplished and the patient reported no dissatisfaction with said orthotics.  Patient was advised of breakin period and how to report any issues. 

## 2019-04-10 ENCOUNTER — Other Ambulatory Visit: Payer: Self-pay

## 2019-04-10 ENCOUNTER — Ambulatory Visit (INDEPENDENT_AMBULATORY_CARE_PROVIDER_SITE_OTHER): Admitting: Vascular Surgery

## 2019-04-10 ENCOUNTER — Encounter (INDEPENDENT_AMBULATORY_CARE_PROVIDER_SITE_OTHER): Payer: Self-pay | Admitting: Vascular Surgery

## 2019-04-10 DIAGNOSIS — M79604 Pain in right leg: Secondary | ICD-10-CM

## 2019-04-10 DIAGNOSIS — I872 Venous insufficiency (chronic) (peripheral): Secondary | ICD-10-CM | POA: Diagnosis not present

## 2019-04-10 DIAGNOSIS — M79605 Pain in left leg: Secondary | ICD-10-CM

## 2019-04-10 DIAGNOSIS — I890 Lymphedema, not elsewhere classified: Secondary | ICD-10-CM | POA: Insufficient documentation

## 2019-04-10 DIAGNOSIS — M79606 Pain in leg, unspecified: Secondary | ICD-10-CM | POA: Insufficient documentation

## 2019-04-10 NOTE — Progress Notes (Signed)
MRN : [id number]  Olivia King is a 33 y.o. (Dec 23, 1985) female who presents with chief complaint of  Chief Complaint  Patient presents with  . New Patient (Initial Visit)    Bilateral edema  .  History of Present Illness: Patient is seen for evaluation of leg swelling. The patient first noticed the swelling remotely but is now concerned because of a significant increase in the overall edema. The swelling is associated with pain and discoloration. The patient notes that in the morning the legs are significantly improved but they steadily worsened throughout the course of the day. Elevation makes the legs better, dependency makes them much worse.   There is no history of ulcerations associated with the swelling.   The patient denies any recent changes in their medications.  The patient has not been wearing graduated compression.  The patient has no had any past angiography, interventions or vascular surgery.  The patient denies a history of DVT or PE. There is no prior history of phlebitis. There is no history of primary lymphedema.  There is no history of radiation treatment to the groin or pelvis No history of malignancies. No history of trauma or groin or pelvic surgery. No history of foreign travel or parasitic infections area    Current Meds  Medication Sig  . ARIPiprazole (ABILIFY) 2 MG tablet Take 1 tablet (2 mg total) by mouth daily.  . butalbital-acetaminophen-caffeine (FIORICET) 50-325-40 MG tablet Take 1-2 tablets by mouth every 12 (twelve) hours as needed for headache. No more than 3 in one day.  . cholecalciferol (VITAMIN D) 1000 units tablet Take 1,000 Units by mouth daily.  Marland Kitchen escitalopram (LEXAPRO) 10 MG tablet Take 1 tablet (10 mg total) by mouth daily.  . fluticasone (FLONASE) 50 MCG/ACT nasal spray Place 2 sprays into both nostrils daily.  . furosemide (LASIX) 20 MG tablet Take 1 tablet (20 mg total) by mouth daily as needed for fluid or edema.  .  hydrOXYzine (ATARAX/VISTARIL) 10 MG tablet Take 1 tablet (10 mg total) by mouth at bedtime as needed.  . loratadine (CLARITIN) 10 MG tablet Take 1 tablet (10 mg total) by mouth daily.  . meloxicam (MOBIC) 15 MG tablet TAKE 1 TABLET(15 MG) BY MOUTH DAILY  . Multiple Vitamin (MULTI-VITAMINS) TABS Take by mouth.  . mupirocin ointment (BACTROBAN) 2 % Place 1 application into the nose 2 (two) times daily.  Marland Kitchen olopatadine (PATANOL) 0.1 % ophthalmic solution Place 1 drop into both eyes 2 (two) times daily.    Past Medical History:  Diagnosis Date  . Cigarette smoker   . COVID-19 virus detected 03/21/2019  . IUD contraception 02/06/2016   Placed October 5, 0000000 by Ardeth Perfect, [place]  . Major depressive disorder, recurrent ([place]) 08/19/2015   zoloft made her cry; cymbalta made her too sleepy  . Migraine headache     Past Surgical History:  Procedure Laterality Date  . contraceptive implant      Social History Social History   Tobacco Use  . Smoking status: Current Every Day Smoker    Packs/day: 0.75    Types: Cigarettes    Start date: 03/05/2011  . Smokeless tobacco: Never Used  Substance Use Topics  . Alcohol use: Yes    Alcohol/week: 0.0 standard drinks  . Drug use: No    Family History Family History  Problem Relation Age of Onset  . Hypertension Mother   . Cancer Mother        pancreatic  .  Pancreatic cancer Mother   . Hypertension Brother   . Aneurysm Maternal Grandmother   . Heart attack Maternal Grandfather   . Hypertension Paternal Grandmother   . Heart failure Sister   No family history of bleeding/clotting disorders, porphyria or autoimmune disease   Allergies  Allergen Reactions  . Imitrex [Sumatriptan] Anaphylaxis    Throat and tongue swelling   . Penicillins Rash  . Trintellix [Vortioxetine]     Tingling hands and blurred vision      REVIEW OF SYSTEMS (Negative unless checked)  Constitutional: [] Weight loss  [] Fever  [] Chills Cardiac:  [] Chest pain   [] Chest pressure   [] Palpitations   [] Shortness of breath when laying flat   [] Shortness of breath with exertion. Vascular:  [] Pain in legs with walking   [x] Pain in legs at rest  [] History of DVT   [] Phlebitis   [x] Swelling in legs   [] Varicose veins   [] Non-healing ulcers Pulmonary:   [] Uses home oxygen   [] Productive cough   [] Hemoptysis   [] Wheeze  [] COPD   [] Asthma Neurologic:  [] Dizziness   [] Seizures   [] History of stroke   [] History of TIA  [] Aphasia   [] Vissual changes   [] Weakness or numbness in arm   [] Weakness or numbness in leg Musculoskeletal:   [] Joint swelling   [] Joint pain   [] Low back pain Hematologic:  [] Easy bruising  [] Easy bleeding   [] Hypercoagulable state   [] Anemic Gastrointestinal:  [] Diarrhea   [] Vomiting  [x] Gastroesophageal reflux/heartburn   [] Difficulty swallowing. Genitourinary:  [] Chronic kidney disease   [] Difficult urination  [] Frequent urination   [] Blood in urine Skin:  [] Rashes   [] Ulcers  Psychological:  [] History of anxiety   []  History of major depression.  Physical Examination  Vitals:   04/10/19 1046  BP: 125/81  Pulse: 91  Resp: 18  Weight: 227 lb (103 kg)  Height: 5\' 3"  (1.6 m)   Body mass index is 40.21 kg/m. Gen: WD/WN, NAD Head: [place]/AT, No temporalis wasting.  Ear/Nose/Throat: Hearing grossly intact, nares w/o erythema or drainage, poor dentition Eyes: PER, EOMI, sclera nonicteric.  Neck: Supple, no masses.  No bruit or JVD.  Pulmonary:  Good air movement, clear to auscultation bilaterally, no use of accessory muscles.  Cardiac: RRR, normal S1, S2, no Murmurs. Vascular: scattered varicosities present bilaterally.  Moderate venous stasis changes to the legs bilaterally.  3+ soft pitting edema Vessel Right Left  PT Palpable Palpable  DP Palpable Palpable  Gastrointestinal: soft, non-distended. No guarding/no peritoneal signs.  Musculoskeletal: M/S 5/5 throughout.  No deformity or atrophy.  Neurologic: CN 2-12 intact.  Pain and light touch intact in extremities.  Symmetrical.  Speech is fluent. Motor exam as listed above. Psychiatric: Judgment intact, Mood & affect appropriate for pt's clinical situation. Dermatologic: No rashes or ulcers noted.  No changes consistent with cellulitis. Lymph : No Cervical lymphadenopathy, no lichenification or skin changes of chronic lymphedema.  CBC Lab Results  Component Value Date   WBC 11.3 (H) 03/04/2018   HGB 12.1 03/04/2018   HCT 35.3 03/04/2018   MCV 88.0 03/04/2018   PLT 347 03/04/2018    BMET    Component Value Date/Time   NA 136 03/04/2018 1551   NA 137 09/17/2015 1019   K 3.6 03/04/2018 1551   CL 104 03/04/2018 1551   CO2 29 03/04/2018 1551   GLUCOSE 87 03/04/2018 1551   BUN 7 03/04/2018 1551   BUN 8 09/17/2015 1019   CREATININE 0.94 03/04/2018 1551   CALCIUM 9.1  03/04/2018 1551   GFRNONAA 80 03/04/2018 1551   GFRAA 92 03/04/2018 1551   CrCl cannot be calculated (Patient's most recent lab result is older than the maximum 21 days allowed.).  COAG No results found for: INR, PROTIME  Radiology No results found.   Assessment/Plan 1. Lymphedema No surgery or intervention at this point in time.    I have had a long discussion with the patient regarding venous insufficiency and why it  causes symptoms. I have discussed with the patient the chronic skin changes that accompany venous insufficiency and the long term sequela such as infection and ulceration.  Patient will begin wearing graduated compression stockings class 1 (20-30 mmHg) or compression wraps on a daily basis a prescription was given. The patient will put the stockings on first thing in the morning and removing them in the evening. The patient is instructed specifically not to sleep in the stockings.    In addition, behavioral modification including several periods of elevation of the lower extremities during the day will be continued. I have demonstrated that proper elevation is a  position with the ankles at heart level.  The patient is instructed to begin routine exercise, especially walking on a daily basis  Patient should undergo duplex ultrasound of the venous system to ensure that DVT or reflux is not present.  Following the review of the ultrasound the patient will follow up in 2-3 months to reassess the degree of swelling and the control that graduated compression stockings or compression wraps  is offering.   The patient can be assessed for a Lymph Pump at that time  2. Chronic venous insufficiency No surgery or intervention at this point in time.    I have had a long discussion with the patient regarding venous insufficiency and why it  causes symptoms. I have discussed with the patient the chronic skin changes that accompany venous insufficiency and the long term sequela such as infection and ulceration.  Patient will begin wearing graduated compression stockings class 1 (20-30 mmHg) or compression wraps on a daily basis a prescription was given. The patient will put the stockings on first thing in the morning and removing them in the evening. The patient is instructed specifically not to sleep in the stockings.    In addition, behavioral modification including several periods of elevation of the lower extremities during the day will be continued. I have demonstrated that proper elevation is a position with the ankles at heart level.  The patient is instructed to begin routine exercise, especially walking on a daily basis  Patient should undergo duplex ultrasound of the venous system to ensure that DVT or reflux is not present.  Following the review of the ultrasound the patient will follow up in 2-3 months to reassess the degree of swelling and the control that graduated compression stockings or compression wraps  is offering.   The patient can be assessed for a Lymph Pump at that time - VENOUS REFLUX; Future  3. Pain in both lower extremities No surgery or  intervention at this point in time.    I have had a long discussion with the patient regarding venous insufficiency and why it  causes symptoms. I have discussed with the patient the chronic skin changes that accompany venous insufficiency and the long term sequela such as infection and ulceration.  Patient will begin wearing graduated compression stockings class 1 (20-30 mmHg) or compression wraps on a daily basis a prescription was given. The patient will put  the stockings on first thing in the morning and removing them in the evening. The patient is instructed specifically not to sleep in the stockings.    In addition, behavioral modification including several periods of elevation of the lower extremities during the day will be continued. I have demonstrated that proper elevation is a position with the ankles at heart level.  The patient is instructed to begin routine exercise, especially walking on a daily basis  Patient should undergo duplex ultrasound of the venous system to ensure that DVT or reflux is not present.  Following the review of the ultrasound the patient will follow up in 2-3 months to reassess the degree of swelling and the control that graduated compression stockings or compression wraps  is offering.   The patient can be assessed for a Lymph Pump at that time - VENOUS REFLUX; Future   Hortencia Pilar, MD  04/10/2019 10:51 AM

## 2019-04-12 ENCOUNTER — Encounter: Payer: Self-pay | Admitting: Family Medicine

## 2019-04-23 ENCOUNTER — Encounter (INDEPENDENT_AMBULATORY_CARE_PROVIDER_SITE_OTHER): Payer: Self-pay | Admitting: Vascular Surgery

## 2019-05-03 ENCOUNTER — Other Ambulatory Visit: Payer: Self-pay | Admitting: Podiatry

## 2019-05-04 ENCOUNTER — Other Ambulatory Visit: Payer: Self-pay

## 2019-05-04 DIAGNOSIS — R600 Localized edema: Secondary | ICD-10-CM

## 2019-05-04 MED ORDER — FUROSEMIDE 20 MG PO TABS: 20.0000 mg | ORAL_TABLET | Freq: Every day | ORAL | 0 refills | Status: DC | PRN

## 2019-05-04 NOTE — Telephone Encounter (Signed)
Refill request for general medication. Furosemide 20 mg  Last office visit 03/29/2019  Follow up on 05/10/2019

## 2019-05-10 ENCOUNTER — Encounter: Payer: Self-pay | Admitting: Family Medicine

## 2019-05-10 ENCOUNTER — Ambulatory Visit (INDEPENDENT_AMBULATORY_CARE_PROVIDER_SITE_OTHER): Admitting: Family Medicine

## 2019-05-10 ENCOUNTER — Other Ambulatory Visit (HOSPITAL_COMMUNITY)
Admission: RE | Admit: 2019-05-10 | Discharge: 2019-05-10 | Disposition: A | Discharge status: Home / Self Care | Source: Ambulatory Visit | Attending: Family Medicine | Admitting: Family Medicine

## 2019-05-10 ENCOUNTER — Other Ambulatory Visit: Payer: Self-pay

## 2019-05-10 DIAGNOSIS — F410 Panic disorder [episodic paroxysmal anxiety] without agoraphobia: Secondary | ICD-10-CM

## 2019-05-10 DIAGNOSIS — R454 Irritability and anger: Secondary | ICD-10-CM | POA: Diagnosis not present

## 2019-05-10 DIAGNOSIS — F331 Major depressive disorder, recurrent, moderate: Secondary | ICD-10-CM

## 2019-05-10 DIAGNOSIS — R350 Frequency of micturition: Secondary | ICD-10-CM | POA: Diagnosis present

## 2019-05-10 DIAGNOSIS — Z131 Encounter for screening for diabetes mellitus: Secondary | ICD-10-CM

## 2019-05-10 DIAGNOSIS — R601 Generalized edema: Secondary | ICD-10-CM

## 2019-05-10 MED ORDER — ARIPIPRAZOLE 5 MG PO TABS: 5.0000 mg | ORAL_TABLET | Freq: Every day | ORAL | 1 refills | Status: DC

## 2019-05-10 NOTE — Progress Notes (Addendum)
Name: Olivia King   MRN: [id number]    DOB: [birth year]   Date:05/10/2019       Progress Note  Subjective  Chief Complaint  Chief Complaint  Patient presents with  . Follow-up    6 week F/U  . Depression    Would like a increase on her Abilify due to her anger outburst she talked to Dr. Ancil Boozer and was informed to take 2 of the 2 mg Abilify daily and still not helping    I connected with  [name]  on 05/10/19 at 10:40 AM EST by a video enabled telemedicine application and verified that I am speaking with the correct person using two identifiers.  I discussed the limitations of evaluation and management by telemedicine and the availability of in person appointments. The patient expressed understanding and agreed to proceed. Staff also discussed with the patient that there may be a patient responsible charge related to this service. Patient Location: at home  Provider Location: [facility]  HPI  Depression: she has been diagnosed since age 69 yo when husband was deployed when their son was 74 yo. She has another daughter since. Tried multiple therapies. Zoloft made her more emotional, paxil did not work, the CR helped for a while but caused sleep disturbance. She was on  Trintelix from 2019 until 2020  but states it caused blurred vision and hand tingling. Shestarted Lexapro Feb 2020,was feeling worse in the Fall of 2020 , she had noticed that she was losing her  temper at home, and having anger outburst , we added Abilify 2 mg and initially anger outburst improved but is getting progressively worse even though she has gone up to 4 mg ( as advised if needed )  Anxiety: she states she does not like to drive, she states this past weekend she booked a hotel in [place] for her son to play football ( travel ball) and she felt terrified of driving and cried before they went, and she felt very anxious back also. She states she is terrified of being in a car accident when  away from home, without another adult.   Recent UTI: went to Urgent Care on 01/10 and took Macrobid for 5 days but continues to have urine pressure, no dysuria but still has urinary frequency.   Edema lower extremity: she has chronic venous insufficiency, stands all day at work and states even with rx stocking hoses given by vascular surgeon, she feels like hands are puffy and sometimes wakes up with face puffy. Explained she needs to come in for labs  Patient Active Problem List   Diagnosis Date Noted  . Lymphedema 04/10/2019  . Chronic venous insufficiency 04/10/2019  . Leg pain 04/10/2019  . Bilateral carpal tunnel syndrome 11/04/2018  . Morbid obesity ([place]) 06/14/2017  . Anxiety 03/24/2017  . Contraception management 01/06/2016  . Glucosuria 09/17/2015  . Major depressive disorder, recurrent ([place]) 08/19/2015  . Benign neoplasm of skin of nose 07/28/2015  . Low serum vitamin D 12/24/2014  . Migraine with aura 12/21/2014    Past Surgical History:  Procedure Laterality Date  . contraceptive implant      Family History  Problem Relation Age of Onset  . Hypertension Mother   . Cancer Mother        pancreatic  . Pancreatic cancer Mother   . Hypertension Brother   . Aneurysm Maternal Grandmother   . Heart attack Maternal Grandfather   . Hypertension Paternal Grandmother   .  Heart failure Sister      Current Outpatient Medications:  .  ARIPiprazole (ABILIFY) 2 MG tablet, Take 1 tablet (2 mg total) by mouth daily. (Patient taking differently: Take 4 mg by mouth daily. ), Disp: 90 tablet, Rfl: 0 .  butalbital-acetaminophen-caffeine (FIORICET) 50-325-40 MG tablet, Take 1-2 tablets by mouth every 12 (twelve) hours as needed for headache. No more than 3 in one day., Disp: 10 tablet, Rfl: 0 .  cholecalciferol (VITAMIN D) 1000 units tablet, Take 1,000 Units by mouth daily., Disp: , Rfl:  .  escitalopram (LEXAPRO) 10 MG tablet, Take 1 tablet (10 mg total) by mouth daily., Disp: 90  tablet, Rfl: 0 .  fluticasone (FLONASE) 50 MCG/ACT nasal spray, Place 2 sprays into both nostrils daily., Disp: 16 g, Rfl: 2 .  furosemide (LASIX) 20 MG tablet, Take 1 tablet (20 mg total) by mouth daily as needed for fluid or edema., Disp: 30 tablet, Rfl: 0 .  hydrOXYzine (ATARAX/VISTARIL) 10 MG tablet, Take 1 tablet (10 mg total) by mouth at bedtime as needed., Disp: 90 tablet, Rfl: 0 .  loratadine (CLARITIN) 10 MG tablet, Take 1 tablet (10 mg total) by mouth daily., Disp: 90 tablet, Rfl: 0 .  meloxicam (MOBIC) 15 MG tablet, TAKE 1 TABLET(15 MG) BY MOUTH DAILY. NEED TO FOLLOW UP WITH MD BEFORE FURTHER REFILLS, Disp: 90 tablet, Rfl: 0 .  Multiple Vitamin (MULTI-VITAMINS) TABS, Take by mouth., Disp: , Rfl:  .  mupirocin ointment (BACTROBAN) 2 %, Place 1 application into the nose 2 (two) times daily., Disp: 22 g, Rfl: 0 .  olopatadine (PATANOL) 0.1 % ophthalmic solution, Place 1 drop into both eyes 2 (two) times daily., Disp: 5 mL, Rfl: 2 .  triamcinolone cream (KENALOG) 0.1 %, APPLY TO ACTIVE LESIONS EVERY DAY, Disp: , Rfl:   Allergies  Allergen Reactions  . Imitrex [Sumatriptan] Anaphylaxis    Throat and tongue swelling   . Penicillins Rash  . Trintellix [Vortioxetine]     Tingling hands and blurred vision     I personally reviewed active problem list, medication list, allergies, family history, social history with the patient/caregiver today.   ROS  Ten systems reviewed and is negative except as mentioned in HPI   Objective  Virtual encounter, vitals not obtained.  There is no height or weight on file to calculate BMI.  Physical Exam  Awake, alert and oriented  PHQ2/9: Depression screen [facility] 2/9 05/10/2019 03/29/2019 02/15/2019 12/16/2018 10/19/2018  Decreased Interest 0 0 3 0 0  Down, Depressed, Hopeless 0 0 1 0 0  PHQ - 2 Score 0 0 4 0 0  Altered sleeping 0 0 0 0 0  Tired, decreased energy 0 0 1 0 1  Change in appetite 0 1 0 0 0  Feeling bad or failure about yourself  0 0 2  0 0  Trouble concentrating 0 0 3 0 0  Moving slowly or fidgety/restless 0 0 3 0 0  Suicidal thoughts 0 0 0 0 0  PHQ-9 Score 0 1 13 0 1  Difficult doing work/chores Not difficult at all Not difficult at all Somewhat difficult Not difficult at all -  Some recent data might be hidden   PHQ-2/9 Result is negative.    Fall Risk: Fall Risk  05/10/2019 02/15/2019 12/16/2018 08/30/2018 07/22/2018  Falls in the past year? 0 0 0 0 0  Number falls in past yr: 0 0 0 0 0  Injury with Fall? 0 0 0 0 0  Assessment & Plan  1. Irritability and anger  - ARIPiprazole (ABILIFY) 5 MG tablet; Take 1 tablet (5 mg total) by mouth daily.  Dispense: 30 tablet; Refill: 1 - Ambulatory referral to Psychiatry  2. Panic attack  - ARIPiprazole (ABILIFY) 5 MG tablet; Take 1 tablet (5 mg total) by mouth daily.  Dispense: 30 tablet; Refill: 1 - Ambulatory referral to Psychiatry  3. Moderate episode of recurrent major depressive disorder (HCC)  - ARIPiprazole (ABILIFY) 5 MG tablet; Take 1 tablet (5 mg total) by mouth daily.  Dispense: 30 tablet; Refill: 1 - Ambulatory referral to Psychiatry  4. Mild generalized edema  - Microalbumin / creatinine urine ratio - COMPLETE METABOLIC PANEL WITH GFR - CBC with Differential/Platelet - TSH  5. Urinary frequency  - Cervicovaginal ancillary only - Urine Culture  6. Diabetes mellitus screening  - Hemoglobin A1c  The patient was advised to call back or seek an in-person evaluation if the symptoms worsen or if the condition fails to improve as anticipated.  I provided 25  minutes of non-face-to-face time during this encounter.

## 2019-05-10 NOTE — Addendum Note (Signed)
Addended by: Steele Sizer F on: 05/10/2019 11:19 AM   Modules accepted: Orders, Level of Service

## 2019-05-11 LAB — CBC WITH DIFFERENTIAL/PLATELET
Absolute Monocytes: 812 cells/uL (ref 200–950)
Basophils Absolute: 126 cells/uL (ref 0–200)
Basophils Relative: 0.9 %
Eosinophils Absolute: 658 cells/uL — ABNORMAL HIGH (ref 15–500)
Eosinophils Relative: 4.7 %
HCT: 36.6 % (ref 35.0–45.0)
Hemoglobin: 12.2 g/dL (ref 11.7–15.5)
Lymphs Abs: 3290 cells/uL (ref 850–3900)
MCH: 30.3 pg (ref 27.0–33.0)
MCHC: 33.3 g/dL (ref 32.0–36.0)
MCV: 91 fL (ref 80.0–100.0)
MPV: 10.1 fL (ref 7.5–12.5)
Monocytes Relative: 5.8 %
Neutro Abs: 9114 cells/uL — ABNORMAL HIGH (ref 1500–7800)
Neutrophils Relative %: 65.1 %
Platelets: 341 10*3/uL (ref 140–400)
RBC: 4.02 10*6/uL (ref 3.80–5.10)
RDW: 12.3 % (ref 11.0–15.0)
Total Lymphocyte: 23.5 %
WBC: 14 10*3/uL — ABNORMAL HIGH (ref 3.8–10.8)

## 2019-05-11 LAB — MICROALBUMIN / CREATININE URINE RATIO
Creatinine, Urine: 166 mg/dL (ref 20–275)
Microalb Creat Ratio: 28 mcg/mg creat (ref <30)
Microalb, Ur: 4.7 mg/dL

## 2019-05-11 LAB — COMPLETE METABOLIC PANEL WITH GFR
AG Ratio: 1.4 (calc) (ref 1.0–2.5)
ALT: 34 U/L — ABNORMAL HIGH (ref 6–29)
AST: 34 U/L — ABNORMAL HIGH (ref 10–30)
Albumin: 3.8 g/dL (ref 3.6–5.1)
Alkaline phosphatase (APISO): 63 U/L (ref 31–125)
BUN: 11 mg/dL (ref 7–25)
CO2: 28 mmol/L (ref 20–32)
Calcium: 9.3 mg/dL (ref 8.6–10.2)
Chloride: 105 mmol/L (ref 98–110)
Creat: 0.88 mg/dL (ref 0.50–1.10)
GFR, Est African American: 99 mL/min/{1.73_m2} (ref >=60)
GFR, Est Non African American: 86 mL/min/{1.73_m2} (ref >=60)
Globulin: 2.8 g/dL (calc) (ref 1.9–3.7)
Glucose, Bld: 76 mg/dL (ref 65–99)
Potassium: 4.6 mmol/L (ref 3.5–5.3)
Sodium: 139 mmol/L (ref 135–146)
Total Bilirubin: 0.3 mg/dL (ref 0.2–1.2)
Total Protein: 6.6 g/dL (ref 6.1–8.1)

## 2019-05-11 LAB — TSH: TSH: 2.17 mIU/L

## 2019-05-11 LAB — HEMOGLOBIN A1C
Hgb A1c MFr Bld: 5.1 % of total Hgb (ref <5.7)
Mean Plasma Glucose: 100 (calc)
eAG (mmol/L): 5.5 (calc)

## 2019-05-11 LAB — URINE CULTURE
MICRO NUMBER:: 10062293
Result:: NO GROWTH
SPECIMEN QUALITY:: ADEQUATE

## 2019-05-12 LAB — CERVICOVAGINAL ANCILLARY ONLY
Chlamydia: NEGATIVE
Comment: NEGATIVE
Comment: NORMAL
Neisseria Gonorrhea: NEGATIVE

## 2019-05-17 ENCOUNTER — Encounter: Payer: Self-pay | Admitting: Family Medicine

## 2019-05-18 ENCOUNTER — Other Ambulatory Visit: Payer: Self-pay | Admitting: Family Medicine

## 2019-05-18 DIAGNOSIS — D72828 Other elevated white blood cell count: Secondary | ICD-10-CM

## 2019-05-19 ENCOUNTER — Other Ambulatory Visit: Payer: Self-pay

## 2019-05-19 ENCOUNTER — Encounter: Payer: Self-pay | Admitting: Oncology

## 2019-05-19 NOTE — Progress Notes (Signed)
Patient referred by Dr. Ancil Boozer for elevated white blood cell count. Patient aware of referral and appointment. Chart reviewed with patient. Pt smokes about 4-5 cigarettes a day and has been smoking for about 9 years. No concerns voiced at this time.

## 2019-05-22 ENCOUNTER — Inpatient Hospital Stay

## 2019-05-22 ENCOUNTER — Other Ambulatory Visit: Payer: Self-pay

## 2019-05-22 ENCOUNTER — Encounter: Payer: Self-pay | Admitting: Oncology

## 2019-05-22 ENCOUNTER — Inpatient Hospital Stay: Attending: Oncology | Admitting: Oncology

## 2019-05-22 VITALS — BP 114/77 | HR 101 | Temp 96.5°F | Resp 16 | Wt 236.3 lb

## 2019-05-22 DIAGNOSIS — F17210 Nicotine dependence, cigarettes, uncomplicated: Secondary | ICD-10-CM | POA: Insufficient documentation

## 2019-05-22 DIAGNOSIS — Z720 Tobacco use: Secondary | ICD-10-CM | POA: Diagnosis not present

## 2019-05-22 DIAGNOSIS — F329 Major depressive disorder, single episode, unspecified: Secondary | ICD-10-CM | POA: Diagnosis not present

## 2019-05-22 DIAGNOSIS — Z79899 Other long term (current) drug therapy: Secondary | ICD-10-CM | POA: Diagnosis not present

## 2019-05-22 DIAGNOSIS — D72829 Elevated white blood cell count, unspecified: Secondary | ICD-10-CM

## 2019-05-22 LAB — CBC WITH DIFFERENTIAL/PLATELET
Abs Immature Granulocytes: 0.05 10*3/uL (ref 0.00–0.07)
Basophils Absolute: 0.1 10*3/uL (ref 0.0–0.1)
Basophils Relative: 1 %
Eosinophils Absolute: 0.5 10*3/uL (ref 0.0–0.5)
Eosinophils Relative: 4 %
HCT: 37.8 % (ref 36.0–46.0)
Hemoglobin: 12.1 g/dL (ref 12.0–15.0)
Immature Granulocytes: 0 %
Lymphocytes Relative: 27 %
Lymphs Abs: 3.2 10*3/uL (ref 0.7–4.0)
MCH: 29.7 pg (ref 26.0–34.0)
MCHC: 32 g/dL (ref 30.0–36.0)
MCV: 92.9 fL (ref 80.0–100.0)
Monocytes Absolute: 0.8 10*3/uL (ref 0.1–1.0)
Monocytes Relative: 7 %
Neutro Abs: 7.2 10*3/uL (ref 1.7–7.7)
Neutrophils Relative %: 61 %
Platelets: 329 10*3/uL (ref 150–400)
RBC: 4.07 MIL/uL (ref 3.87–5.11)
RDW: 12.7 % (ref 11.5–15.5)
WBC: 11.8 10*3/uL — ABNORMAL HIGH (ref 4.0–10.5)
nRBC: 0 % (ref 0.0–0.2)

## 2019-05-22 LAB — TECHNOLOGIST SMEAR REVIEW: Plt Morphology: ADEQUATE

## 2019-05-22 NOTE — Progress Notes (Signed)
Hematology/Oncology Consult note [facility] [phone] [phone]   Patient Care Team: Steele Sizer, MD as PCP - General (Family Medicine)  REFERRING PROVIDER: Steele Sizer, MD  CHIEF COMPLAINTS/REASON FOR VISIT:  Evaluation of leukocytosis  HISTORY OF PRESENTING ILLNESS:  Olivia King is a  34 y.o.  female with PMH listed below who was referred to me for evaluation of leukocytosis Reviewed patient' recent labs obtained by PCP.  CBC showed elevated white count of 14, predominantly neutrophilia and eosinophilia.  Previous lab records reviewed. Leukocytosis onset of chronic, duration is since Dec 2018.   No aggravating or elevated factors. Associated symptoms or signs:  Denies weight loss, fever, chills,night sweats.  Denies fatigue Smoking history: 6.75-pack-year smoking history.  Patient reports that she has reduced her smoking since end of last year.  Currently smokes 5 cigarettes a day. History of recent oral steroid use or steroid injection: She has had a cortisone injection to joints, last treatment was in October 2020. History of recent infection: Denies Autoimmune disease history.  Denies    Review of Systems  Constitutional: Negative for appetite change, chills, fatigue and fever.  HENT:   Negative for hearing loss and voice change.   Eyes: Negative for eye problems.  Respiratory: Negative for chest tightness and cough.   Cardiovascular: Negative for chest pain.  Gastrointestinal: Negative for abdominal distention, abdominal pain and blood in stool.  Endocrine: Negative for hot flashes.  Genitourinary: Negative for difficulty urinating and frequency.   Musculoskeletal: Negative for arthralgias.  Skin: Negative for itching and rash.  Neurological: Negative for extremity weakness.  Hematological: Negative for adenopathy.  Psychiatric/Behavioral: Negative for confusion.     MEDICAL HISTORY:  Past Medical  History:  Diagnosis Date  . Cigarette smoker   . COVID-19 virus detected 03/21/2019  . IUD contraception 02/06/2016   Placed October 5, 0000000 by Ardeth Perfect, [place]  . Major depressive disorder, recurrent ([place]) 08/19/2015   zoloft made her cry; cymbalta made her too sleepy  . Migraine headache     SURGICAL HISTORY: Past Surgical History:  Procedure Laterality Date  . CESAREAN SECTION  07/25/2002 &2012  . contraceptive implant      SOCIAL HISTORY: Social History   Socioeconomic History  . Marital status: Widowed    Spouse name: Not on file  . Number of children: 2  . Years of education: Not on file  . Highest education level: Associate degree: occupational, Hotel manager, or vocational program  Occupational History  . Occupation: CMA    Comment: [employer]  Tobacco Use  . Smoking status: Current Every Day Smoker    Packs/day: 0.75    Years: 9.00    Pack years: 6.75    Types: Cigarettes    Start date: 03/05/2011  . Smokeless tobacco: Never Used  Substance and Sexual Activity  . Alcohol use: Yes    Alcohol/week: 0.0 standard drinks  . Drug use: No  . Sexual activity: Yes    Partners: Male    Birth control/protection: I.U.D.    Comment: Patient has liletta IUD  Other Topics Concern  . Not on file  Social History Narrative   She is a widow, he died Jul 24, 2009 while he was deployed.    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 Difficulty of Paying Living Expenses: Not on file  Food Insecurity: No Food Insecurity  . Worried About Charity fundraiser  in the Last Year: Never true  . Ran Out of Food in the Last Year: Never true  Transportation Needs: No Transportation Needs  . Lack of Transportation (Medical): No  . Lack of Transportation (Non-Medical): No  Physical Activity: Inactive  . Days of Exercise per Week: 0 days  . Minutes of Exercise per Session: 0 min  Stress:   . Feeling of Stress : Not on  file  Social Connections:   . Frequency of Communication with Friends and Family: Not on file  . Frequency of Social Gatherings with Friends and Family: Not on file  . Attends Religious Services: Not on file  . Active Member of Clubs or Organizations: Not on file  . Attends Archivist Meetings: Not on file  . Marital Status: Not on file  Intimate Partner Violence:   . Fear of Current or Ex-Partner: Not on file  . Emotionally Abused: Not on file  . Physically Abused: Not on file  . Sexually Abused: Not on file    FAMILY HISTORY: Family History  Problem Relation Age of Onset  . Hypertension Mother   . Cancer Mother        pancreatic  . Pancreatic cancer Mother   . Hypertension Brother   . Aneurysm Maternal Grandmother   . Heart attack Maternal Grandfather   . Hypertension Paternal Grandmother   . Heart failure Sister     ALLERGIES:  is allergic to imitrex [sumatriptan]; penicillins; and trintellix [vortioxetine].  MEDICATIONS:  Current Outpatient Medications  Medication Sig Dispense Refill  . ARIPiprazole (ABILIFY) 5 MG tablet Take 1 tablet (5 mg total) by mouth daily. 30 tablet 1  . butalbital-acetaminophen-caffeine (FIORICET) 50-325-40 MG tablet Take 1-2 tablets by mouth every 12 (twelve) hours as needed for headache. No more than 3 in one day. 10 tablet 0  . cholecalciferol (VITAMIN D) 1000 units tablet Take 1,000 Units by mouth daily.    Marland Kitchen escitalopram (LEXAPRO) 10 MG tablet Take 1 tablet (10 mg total) by mouth daily. 90 tablet 0  . fluticasone (FLONASE) 50 MCG/ACT nasal spray Place 2 sprays into both nostrils daily. 16 g 2  . furosemide (LASIX) 20 MG tablet Take 1 tablet (20 mg total) by mouth daily as needed for fluid or edema. 30 tablet 0  . hydrOXYzine (ATARAX/VISTARIL) 10 MG tablet Take 1 tablet (10 mg total) by mouth at bedtime as needed. 90 tablet 0  . loratadine (CLARITIN) 10 MG tablet Take 1 tablet (10 mg total) by mouth daily. (Patient taking  differently: Take 10 mg by mouth daily as needed. ) 90 tablet 0  . meloxicam (MOBIC) 15 MG tablet TAKE 1 TABLET(15 MG) BY MOUTH DAILY. NEED TO FOLLOW UP WITH MD BEFORE FURTHER REFILLS 90 tablet 0  . Multiple Vitamin (MULTI-VITAMINS) TABS Take by mouth.    Marland Kitchen olopatadine (PATANOL) 0.1 % ophthalmic solution Place 1 drop into both eyes 2 (two) times daily. 5 mL 2  . mupirocin ointment (BACTROBAN) 2 % Place 1 application into the nose 2 (two) times daily. (Patient not taking: Reported on 05/19/2019) 22 g 0  . triamcinolone cream (KENALOG) 0.1 % APPLY TO ACTIVE LESIONS EVERY DAY     No current facility-administered medications for this visit.     PHYSICAL EXAMINATION: ECOG PERFORMANCE STATUS: 0 - Asymptomatic Vitals:   05/22/19 1515  BP: 114/77  Pulse: (!) 101  Resp: 16  Temp: (!) 96.5 F (35.8 C)   Filed Weights   05/22/19 1515  Weight: 236 lb  4.8 oz (107.2 kg)    Physical Exam Constitutional:      General: She is not in acute distress. HENT:     Head: Normocephalic and atraumatic.  Eyes:     General: No scleral icterus. Cardiovascular:     Rate and Rhythm: Normal rate and regular rhythm.     Heart sounds: Normal heart sounds.  Pulmonary:     Effort: Pulmonary effort is normal. No respiratory distress.     Breath sounds: No wheezing.  Abdominal:     General: Bowel sounds are normal. There is no distension.     Palpations: Abdomen is soft.  Musculoskeletal:        General: No deformity. Normal range of motion.     Cervical back: Normal range of motion and neck supple.  Skin:    General: Skin is warm and dry.     Findings: No erythema or rash.  Neurological:     Mental Status: She is alert and oriented to person, place, and time. Mental status is at baseline.     Cranial Nerves: No cranial nerve deficit.     Coordination: Coordination normal.  Psychiatric:        Mood and Affect: Mood normal.     CMP Latest Ref Rng & Units 05/10/2019  Glucose 65 - 99 mg/dL 76  BUN 7  - 25 mg/dL 11  Creatinine 0.50 - 1.10 mg/dL 0.88  Sodium 135 - 146 mmol/L 139  Potassium 3.5 - 5.3 mmol/L 4.6  Chloride 98 - 110 mmol/L 105  CO2 20 - 32 mmol/L 28  Calcium 8.6 - 10.2 mg/dL 9.3  Total Protein 6.1 - 8.1 g/dL 6.6  Total Bilirubin 0.2 - 1.2 mg/dL 0.3  Alkaline Phos 33 - 115 U/L -  AST 10 - 30 U/L 34(H)  ALT 6 - 29 U/L 34(H)   CBC Latest Ref Rng & Units 05/22/2019  WBC 4.0 - 10.5 K/uL 11.8(H)  Hemoglobin 12.0 - 15.0 g/dL 12.1  Hematocrit 36.0 - 46.0 % 37.8  Platelets 150 - 400 K/uL 329     No results found.  LABORATORY DATA:  I have reviewed the data as listed Lab Results  Component Value Date   WBC 11.8 (H) 05/22/2019   HGB 12.1 05/22/2019   HCT 37.8 05/22/2019   MCV 92.9 05/22/2019   PLT 329 05/22/2019   Recent Labs    05/10/19 1617  NA 139  K 4.6  CL 105  CO2 28  GLUCOSE 76  BUN 11  CREATININE 0.88  CALCIUM 9.3  GFRNONAA 86  GFRAA 99  PROT 6.6  AST 34*  ALT 34*  BILITOT 0.3   Iron/TIBC/Ferritin/ %Sat No results found for: IRON, TIBC, FERRITIN, IRONPCTSAT      ASSESSMENT & PLAN:  1. Leukocytosis, unspecified type   2. Tobacco abuse    Labs reviewed and discussed with patient that Leukocytosis, predominantly neutrophilia, can be secondary to infection, chronic inflammation, smoking, autoimmune disease, or underlying bone marrow disorders.   Her smoking history can definitely contribute to leukocytosis. Recent corticosteroid injection can also increase leukocytosis.  Rule out malignant etiologies. For the work up of patient's leukocytosis, I recommend checking CBC;CMP, LDH, pathology smear review, flowcytometry, etc. Smoke cessation was discussed with patient.  She is motivated. Orders Placed This Encounter  Procedures  . CBC with Differential/Platelet    Standing Status:   Future    Number of Occurrences:   1    Standing Expiration Date:   05/21/2020  .  Technologist smear review    Standing Status:   Future    Number of Occurrences:    1    Standing Expiration Date:   05/21/2020  . Flow cytometry panel-leukemia/lymphoma work-up    Standing Status:   Future    Number of Occurrences:   1    Standing Expiration Date:   05/21/2020    All questions were answered. The patient knows to call the clinic with any problems questions or concerns.  Return of visit: 1 week virtually to discuss results. Thank you for this kind referral and the opportunity to participate in the care of this patient. A copy of today's note is routed to referring provider    Earlie Server, MD, PhD Hematology Oncology [facility] Pager- [phone] 05/22/2019

## 2019-05-24 LAB — COMP PANEL: LEUKEMIA/LYMPHOMA

## 2019-05-31 ENCOUNTER — Inpatient Hospital Stay: Admitting: Oncology

## 2019-05-31 ENCOUNTER — Ambulatory Visit: Admitting: Podiatry

## 2019-06-05 ENCOUNTER — Ambulatory Visit: Admitting: Podiatry

## 2019-06-06 ENCOUNTER — Telehealth: Admitting: Oncology

## 2019-06-09 ENCOUNTER — Ambulatory Visit (INDEPENDENT_AMBULATORY_CARE_PROVIDER_SITE_OTHER): Admitting: Podiatry

## 2019-06-09 ENCOUNTER — Other Ambulatory Visit: Payer: Self-pay | Admitting: Podiatry

## 2019-06-09 ENCOUNTER — Ambulatory Visit (INDEPENDENT_AMBULATORY_CARE_PROVIDER_SITE_OTHER)

## 2019-06-09 ENCOUNTER — Other Ambulatory Visit: Payer: Self-pay

## 2019-06-09 DIAGNOSIS — M778 Other enthesopathies, not elsewhere classified: Secondary | ICD-10-CM

## 2019-06-09 DIAGNOSIS — M79671 Pain in right foot: Secondary | ICD-10-CM | POA: Diagnosis not present

## 2019-06-09 DIAGNOSIS — M722 Plantar fascial fibromatosis: Secondary | ICD-10-CM

## 2019-06-09 MED ORDER — METHYLPREDNISOLONE 4 MG PO TBPK: ORAL_TABLET | ORAL | 0 refills | Status: DC

## 2019-06-12 NOTE — Progress Notes (Signed)
   HPI: 34 y.o. female presenting today with a chief complaint of sharp pain to the lateral right foot that began 4 months ago. She reports associated stiffness and swelling first thing in the morning when she first gets out of bed. Walking and working 12-14 hour days increases the pain. She has been exercising the foot and taking Ibuprofen, Tylenol and Meloxicam for treatment. Patient is here for further evaluation and treatment.   Past Medical History:  Diagnosis Date  . Cigarette smoker   . COVID-19 virus detected 03/21/2019  . IUD contraception 02/06/2016   Placed October 5, 0000000 by Ardeth Perfect, [place]  . Major depressive disorder, recurrent ([place]) 08/19/2015   zoloft made her cry; cymbalta made her too sleepy  . Migraine headache      Physical Exam: General: The patient is alert and oriented x3 in no acute distress.  Dermatology: Skin is warm, dry and supple bilateral lower extremities. Negative for open lesions or macerations.  Vascular: Palpable pedal pulses bilaterally. No edema or erythema noted. Capillary refill within normal limits.  Neurological: Epicritic and protective threshold grossly intact bilaterally.   Musculoskeletal Exam: Pain with palpation noted to the right lateral forefoot. Range of motion within normal limits to all pedal and ankle joints bilateral. Muscle strength 5/5 in all groups bilateral.   Radiographic Exam:  Normal osseous mineralization. Joint spaces preserved. No fracture/dislocation/boney destruction.    Assessment: 1. Capsulitis right lateral forefoot   Plan of Care:  1. Patient evaluated. X-Rays reviewed.  2. Injection of 0.5 mLs Celestone Soluspan injected into the right lateral forefoot.  3. Prescription for Medrol Dose Pak provided to patient. Then continue taking Meloxicam.  4. CAM boot dispensed. Weightbearing for 4 weeks.  5. Return to clinic in 4 weeks.   Works 12 hour shifts at [employer].       Edrick Kins,  DPM [facility]  Dr. Edrick Kins, DPM    [address], [place] [address]                Office [phone]  Fax [phone]

## 2019-06-23 ENCOUNTER — Ambulatory Visit (INDEPENDENT_AMBULATORY_CARE_PROVIDER_SITE_OTHER): Admitting: Family Medicine

## 2019-06-23 ENCOUNTER — Other Ambulatory Visit: Payer: Self-pay

## 2019-06-23 ENCOUNTER — Encounter: Payer: Self-pay | Admitting: Family Medicine

## 2019-06-23 DIAGNOSIS — F410 Panic disorder [episodic paroxysmal anxiety] without agoraphobia: Secondary | ICD-10-CM | POA: Diagnosis not present

## 2019-06-23 DIAGNOSIS — F3341 Major depressive disorder, recurrent, in partial remission: Secondary | ICD-10-CM | POA: Diagnosis not present

## 2019-06-23 DIAGNOSIS — R600 Localized edema: Secondary | ICD-10-CM

## 2019-06-23 DIAGNOSIS — R454 Irritability and anger: Secondary | ICD-10-CM

## 2019-06-23 DIAGNOSIS — J301 Allergic rhinitis due to pollen: Secondary | ICD-10-CM | POA: Diagnosis not present

## 2019-06-23 DIAGNOSIS — R601 Generalized edema: Secondary | ICD-10-CM

## 2019-06-23 DIAGNOSIS — D72829 Elevated white blood cell count, unspecified: Secondary | ICD-10-CM

## 2019-06-23 MED ORDER — ARIPIPRAZOLE 5 MG PO TABS: 5.0000 mg | ORAL_TABLET | Freq: Every day | ORAL | 0 refills | Status: DC

## 2019-06-23 MED ORDER — OLOPATADINE HCL 0.1 % OP SOLN: 1.0000 [drp] | Freq: Two times a day (BID) | OPHTHALMIC | 2 refills | Status: DC

## 2019-06-23 MED ORDER — ESCITALOPRAM OXALATE 10 MG PO TABS: 10.0000 mg | ORAL_TABLET | Freq: Every day | ORAL | 0 refills | Status: DC

## 2019-06-23 NOTE — Progress Notes (Signed)
Name: Olivia King   MRN: [id number]    DOB: 1985/07/05   Date:06/23/2019       Progress Note  Subjective  Chief Complaint  Chief Complaint  Patient presents with  . Anxiety  . Depression  . Edema    I connected with  [name]  on 06/23/19 at  8:00 AM EST by a video enabled telemedicine application and verified that I am speaking with the correct person using two identifiers.  I discussed the limitations of evaluation and management by telemedicine and the availability of in person appointments. The patient expressed understanding and agreed to proceed. Staff also discussed with the patient that there may be a patient responsible charge related to this service. Patient Location: at work  Provider Location: [place]   HPI  Depression: she has been diagnosed since age 44 yo when husband was deployed when their son was 47 yo. She has another daughter since. Tried multiple therapies. Zoloft made her more emotional, paxil did not work, the CR helped for a while but caused sleep disturbance. Shewas onTrintelix from 2019 until 2020  but states it caused blurred vision and hand tingling we switched to Lexapro . In the Fall of 2020 she had noticed that she was losing hertemper at home, and having anger outburst , we added Abilify 2 mg and initially anger outburst improved but is getting progressively worse , we adjusted her dose to 5 mg and she is now doing well. She feels more calm and denies side effects of medications  Anxiety: she states she does not like to drive, she states this past weekend she booked a hotel in [place] for her son to play football ( travel ball) and she felt terrified of driving and cried before they went, and she felt very anxious back also. She states she is terrified of being in a car accident when away from home, without another adult. She is taking her son for another road trip this upcoming weekend and will see if symptoms will be  better controlled.  Edema lower extremity: she has chronic venous insufficiency, stands all day at work and states even with rx stocking hoses given by vascular surgeon, she feels like hands are puffy and sometimes wakes up with face puffy. Reviewed labs done back in January , normal kidney function and no anemia, mild elevation of liver enzymes advised to cut down on otc medication and alcohol   Leucocytosis: WBC has been elevated and she was referred to Dr. Tasia Catchings, had labs and is due for follow up   Right foot capsulitis: she was seen by Dr Amalia Hailey on 06/09/2019 and was given steroid injection and is wearing a ortho boot, and no pain when wearing it, has pain when she skips a day   Obesity: going to [place] and taking Phentermine again   Patient Active Problem List   Diagnosis Date Noted  . Lymphedema 04/10/2019  . Chronic venous insufficiency 04/10/2019  . Leg pain 04/10/2019  . Bilateral carpal tunnel syndrome 11/04/2018  . Morbid obesity ([place]) 06/14/2017  . Anxiety 03/24/2017  . Contraception management 01/06/2016  . Glucosuria 09/17/2015  . Major depressive disorder, recurrent ([place]) 08/19/2015  . Benign neoplasm of skin of nose 07/28/2015  . Low serum vitamin D 12/24/2014  . Migraine with aura 12/21/2014    Past Surgical History:  Procedure Laterality Date  . CESAREAN SECTION  2004 &2012  . contraceptive implant      Family History  Problem Relation  Age of Onset  . Hypertension Mother   . Cancer Mother        pancreatic  . Pancreatic cancer Mother   . Hypertension Brother   . Aneurysm Maternal Grandmother   . Heart attack Maternal Grandfather   . Hypertension Paternal Grandmother   . Heart failure Sister     Social History   Tobacco Use  . Smoking status: Current Every Day Smoker    Packs/day: 0.75    Years: 9.00    Pack years: 6.75    Types: Cigarettes    Start date: 03/05/2011  . Smokeless tobacco: Never Used  Substance Use Topics  . Alcohol use: Yes     Alcohol/week: 0.0 standard drinks    Current Outpatient Medications:  .  ARIPiprazole (ABILIFY) 5 MG tablet, Take 1 tablet (5 mg total) by mouth daily., Disp: 30 tablet, Rfl: 1 .  butalbital-acetaminophen-caffeine (FIORICET) 50-325-40 MG tablet, Take 1-2 tablets by mouth every 12 (twelve) hours as needed for headache. No more than 3 in one day., Disp: 10 tablet, Rfl: 0 .  cholecalciferol (VITAMIN D) 1000 units tablet, Take 1,000 Units by mouth daily., Disp: , Rfl:  .  escitalopram (LEXAPRO) 10 MG tablet, Take 1 tablet (10 mg total) by mouth daily., Disp: 90 tablet, Rfl: 0 .  fluticasone (FLONASE) 50 MCG/ACT nasal spray, Place 2 sprays into both nostrils daily., Disp: 16 g, Rfl: 2 .  furosemide (LASIX) 20 MG tablet, Take 1 tablet (20 mg total) by mouth daily as needed for fluid or edema., Disp: 30 tablet, Rfl: 0 .  hydrOXYzine (ATARAX/VISTARIL) 10 MG tablet, Take 1 tablet (10 mg total) by mouth at bedtime as needed., Disp: 90 tablet, Rfl: 0 .  ipratropium (ATROVENT) 0.03 % nasal spray, ipratropium bromide 21 mcg (0.03 %) nasal spray, Disp: , Rfl:  .  loratadine (CLARITIN) 10 MG tablet, Take 1 tablet (10 mg total) by mouth daily. (Patient taking differently: Take 10 mg by mouth daily as needed. ), Disp: 90 tablet, Rfl: 0 .  meloxicam (MOBIC) 15 MG tablet, TAKE 1 TABLET(15 MG) BY MOUTH DAILY. NEED TO FOLLOW UP WITH MD BEFORE FURTHER REFILLS, Disp: 90 tablet, Rfl: 0 .  Multiple Vitamin (MULTI-VITAMINS) TABS, Take by mouth., Disp: , Rfl:  .  olopatadine (PATANOL) 0.1 % ophthalmic solution, Place 1 drop into both eyes 2 (two) times daily., Disp: 5 mL, Rfl: 2 .  phentermine (ADIPEX-P) 37.5 MG tablet, Take 37.5 mg by mouth daily., Disp: , Rfl:  .  trimethoprim-polymyxin b (POLYTRIM) ophthalmic solution, SMARTSIG:1-2 Drop(s) In Eye(s) 4 Times Daily, Disp: , Rfl:  .  methylPREDNISolone (MEDROL DOSEPAK) 4 MG TBPK tablet, 6 day dose pack - take as directed (Patient not taking: Reported on 06/23/2019), Disp: 21  tablet, Rfl: 0 .  mupirocin ointment (BACTROBAN) 2 %, Place 1 application into the nose 2 (two) times daily. (Patient not taking: Reported on 06/23/2019), Disp: 22 g, Rfl: 0 .  triamcinolone cream (KENALOG) 0.1 %, APPLY TO ACTIVE LESIONS EVERY DAY, Disp: , Rfl:   Allergies  Allergen Reactions  . Imitrex [Sumatriptan] Anaphylaxis    Throat and tongue swelling   . Penicillins Rash  . Trintellix [Vortioxetine]     Tingling hands and blurred vision     I personally reviewed active problem list, medication list, allergies, family history, social history with the patient/caregiver today.   ROS  Ten systems reviewed and is negative except as mentioned in HPI   Objective  Virtual encounter, vitals not obtained.  There is no height or weight on file to calculate BMI.  Physical Exam  Awake, alert and oriented   PHQ2/9: Depression screen [facility] 2/9 06/23/2019 05/10/2019 03/29/2019 02/15/2019 12/16/2018  Decreased Interest 0 0 0 3 0  Down, Depressed, Hopeless 0 0 0 1 0  PHQ - 2 Score 0 0 0 4 0  Altered sleeping 0 0 0 0 0  Tired, decreased energy 0 0 0 1 0  Change in appetite 0 0 1 0 0  Feeling bad or failure about yourself  0 0 0 2 0  Trouble concentrating 1 0 0 3 0  Moving slowly or fidgety/restless 1 0 0 3 0  Suicidal thoughts 0 0 0 0 0  PHQ-9 Score 2 0 1 13 0  Difficult doing work/chores Not difficult at all Not difficult at all Not difficult at all Somewhat difficult Not difficult at all  Some recent data might be hidden   PHQ-2/9 Result is negative.    Fall Risk: Fall Risk  06/23/2019 05/10/2019 02/15/2019 12/16/2018 08/30/2018  Falls in the past year? 0 0 0 0 0  Number falls in past yr: 0 0 0 0 0  Injury with Fall? 0 0 0 0 0     Assessment & Plan  1. Seasonal allergic rhinitis due to pollen  - olopatadine (PATANOL) 0.1 % ophthalmic solution; Place 1 drop into both eyes 2 (two) times daily.  Dispense: 5 mL; Refill: 2  2. Major depression in remission  ([place])  - escitalopram  (LEXAPRO) 10 MG tablet; Take 1 tablet (10 mg total) by mouth daily.  Dispense: 90 tablet; Refill: 0 - ARIPiprazole (ABILIFY) 5 MG tablet; Take 1 tablet (5 mg total) by mouth daily.  Dispense: 90 tablet; Refill: 0  3. Irritability and anger  - ARIPiprazole (ABILIFY) 5 MG tablet; Take 1 tablet (5 mg total) by mouth daily.  Dispense: 90 tablet; Refill: 0  4. Panic attack  - ARIPiprazole (ABILIFY) 5 MG tablet; Take 1 tablet (5 mg total) by mouth daily.  Dispense: 90 tablet; Refill: 0  I discussed the assessment and treatment plan with the patient. The patient was provided an opportunity to ask questions and all were answered. The patient agreed with the plan and demonstrated an understanding of the instructions.  The patient was advised to call back or seek an in-person evaluation if the symptoms worsen or if the condition fails to improve as anticipated.  I provided 25  minutes of non-face-to-face time during this encounter.

## 2019-06-29 ENCOUNTER — Inpatient Hospital Stay: Attending: Oncology | Admitting: Oncology

## 2019-06-29 DIAGNOSIS — Z720 Tobacco use: Secondary | ICD-10-CM | POA: Diagnosis not present

## 2019-06-29 DIAGNOSIS — D72829 Elevated white blood cell count, unspecified: Secondary | ICD-10-CM

## 2019-06-29 NOTE — Progress Notes (Signed)
Patient verified using two identifiers for virtual visit via telephone today.  Patient does not offer any problems today.  

## 2019-06-30 ENCOUNTER — Encounter: Payer: Self-pay | Admitting: Podiatry

## 2019-07-04 ENCOUNTER — Encounter: Payer: Self-pay | Admitting: Oncology

## 2019-07-04 NOTE — Progress Notes (Signed)
HEMATOLOGY-ONCOLOGY TeleHEALTH VISIT PROGRESS NOTE  I connected with Olivia King on 07/04/19 at 11:00 AM EST by video enabled telemedicine visit and verified that I am speaking with the correct person using two identifiers. I discussed the limitations, risks, security and privacy concerns of performing an evaluation and management service by telemedicine and the availability of in-person appointments. I also discussed with the patient that there may be a patient responsible charge related to this service. The patient expressed understanding and agreed to proceed.   Other persons participating in the visit and their role in the encounter:  None  Patient's location: Home  Provider's location: office Chief Complaint: Leukocytosis   INTERVAL HISTORY Olivia King is a 34 y.o. female who has above history reviewed by me today presents for follow up visit for leukocytosis Problems and complaints are listed below:  Patient has had work-up done at the last visit.  She denies any new complaints.  Review of Systems  Constitutional: Negative for appetite change, chills, fatigue and fever.  HENT:   Negative for hearing loss and voice change.   Eyes: Negative for eye problems.  Respiratory: Negative for chest tightness and cough.   Cardiovascular: Negative for chest pain.  Gastrointestinal: Negative for abdominal distention, abdominal pain and blood in stool.  Endocrine: Negative for hot flashes.  Genitourinary: Negative for difficulty urinating and frequency.   Musculoskeletal: Negative for arthralgias.  Skin: Negative for itching and rash.  Neurological: Negative for extremity weakness.  Hematological: Negative for adenopathy.  Psychiatric/Behavioral: Negative for confusion.    Past Medical History:  Diagnosis Date  . Cigarette smoker   . COVID-19 virus detected 03/21/2019  . IUD contraception 02/06/2016   Placed October 5, 0000000 by Ardeth Perfect, [place]  . Major  depressive disorder, recurrent ([place]) 08/19/2015   zoloft made her cry; cymbalta made her too sleepy  . Migraine headache    Past Surgical History:  Procedure Laterality Date  . CESAREAN SECTION  2002/07/29 &2012  . contraceptive implant      Family History  Problem Relation Age of Onset  . Hypertension Mother   . Cancer Mother        pancreatic  . Pancreatic cancer Mother   . Hypertension Brother   . Aneurysm Maternal Grandmother   . Heart attack Maternal Grandfather   . Hypertension Paternal Grandmother   . Heart failure Sister     Social History   Socioeconomic History  . Marital status: Widowed    Spouse name: Not on file  . Number of children: 2  . Years of education: Not on file  . Highest education level: Associate degree: occupational, Hotel manager, or vocational program  Occupational History  . Occupation: CMA    Comment: [employer]  Tobacco Use  . Smoking status: Current Every Day Smoker    Packs/day: 0.75    Years: 9.00    Pack years: 6.75    Types: Cigarettes    Start date: 03/05/2011  . Smokeless tobacco: Never Used  Substance and Sexual Activity  . Alcohol use: Yes    Alcohol/week: 0.0 standard drinks  . Drug use: No  . Sexual activity: Yes    Partners: Male    Birth control/protection: I.U.D.    Comment: Patient has liletta IUD  Other Topics Concern  . Not on file  Social History Narrative   She is a widow, he died 28-Jul-2009 while he was deployed.    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   Social Determinants  of Health   Financial Resource Strain:   . Difficulty of Paying Living Expenses:   Food Insecurity: No Food Insecurity  . Worried About Charity fundraiser in the Last Year: Never true  . Ran Out of Food in the Last Year: Never true  Transportation Needs: No Transportation Needs  . Lack of Transportation (Medical): No  . Lack of Transportation (Non-Medical): No  Physical Activity: Inactive  . Days of Exercise per Week: 0 days  . Minutes of Exercise  per Session: 0 min  Stress:   . Feeling of Stress :   Social Connections:   . Frequency of Communication with Friends and Family:   . Frequency of Social Gatherings with Friends and Family:   . Attends Religious Services:   . Active Member of Clubs or Organizations:   . Attends Archivist Meetings:   Olivia King Marital Status:   Intimate Partner Violence:   . Fear of Current or Ex-Partner:   . Emotionally Abused:   Olivia King Physically Abused:   . Sexually Abused:     Current Outpatient Medications on File Prior to Visit  Medication Sig Dispense Refill  . ARIPiprazole (ABILIFY) 5 MG tablet Take 1 tablet (5 mg total) by mouth daily. 90 tablet 0  . butalbital-acetaminophen-caffeine (FIORICET) 50-325-40 MG tablet Take 1-2 tablets by mouth every 12 (twelve) hours as needed for headache. No more than 3 in one day. 10 tablet 0  . cholecalciferol (VITAMIN D) 1000 units tablet Take 1,000 Units by mouth daily.    Olivia King escitalopram (LEXAPRO) 10 MG tablet Take 1 tablet (10 mg total) by mouth daily. 90 tablet 0  . fluticasone (FLONASE) 50 MCG/ACT nasal spray Place 2 sprays into both nostrils daily. 16 g 2  . furosemide (LASIX) 20 MG tablet Take 1 tablet (20 mg total) by mouth daily as needed for fluid or edema. 30 tablet 0  . hydrOXYzine (ATARAX/VISTARIL) 10 MG tablet Take 1 tablet (10 mg total) by mouth at bedtime as needed. 90 tablet 0  . ipratropium (ATROVENT) 0.03 % nasal spray ipratropium bromide 21 mcg (0.03 %) nasal spray    . loratadine (CLARITIN) 10 MG tablet Take 1 tablet (10 mg total) by mouth daily. (Patient taking differently: Take 10 mg by mouth daily as needed. ) 90 tablet 0  . meloxicam (MOBIC) 15 MG tablet TAKE 1 TABLET(15 MG) BY MOUTH DAILY. NEED TO FOLLOW UP WITH MD BEFORE FURTHER REFILLS 90 tablet 0  . Multiple Vitamin (MULTI-VITAMINS) TABS Take by mouth.    Olivia King olopatadine (PATANOL) 0.1 % ophthalmic solution Place 1 drop into both eyes 2 (two) times daily. 5 mL 2  . phentermine (ADIPEX-P)  37.5 MG tablet Take 37.5 mg by mouth daily.    Olivia King triamcinolone cream (KENALOG) 0.1 % APPLY TO ACTIVE LESIONS EVERY DAY     No current facility-administered medications on file prior to visit.    Allergies  Allergen Reactions  . Imitrex [Sumatriptan] Anaphylaxis    Throat and tongue swelling   . Penicillins Rash  . Trintellix [Vortioxetine]     Tingling hands and blurred vision        Observations/Objective: Today's Vitals   06/29/19 1100  PainSc: 0-No pain   There is no height or weight on file to calculate BMI.  Physical Exam  Constitutional: No distress.  Neurological: She is alert.    CBC    Component Value Date/Time   WBC 11.8 (H) 05/22/2019 1543   RBC 4.07 05/22/2019 1543  HGB 12.1 05/22/2019 1543   HGB 12.6 12/21/2014 1136   HCT 37.8 05/22/2019 1543   HCT 37.4 12/21/2014 1136   PLT 329 05/22/2019 1543   PLT 349 12/21/2014 1136   MCV 92.9 05/22/2019 1543   MCV 88 12/21/2014 1136   MCH 29.7 05/22/2019 1543   MCHC 32.0 05/22/2019 1543   RDW 12.7 05/22/2019 1543   RDW 13.0 12/21/2014 1136   LYMPHSABS 3.2 05/22/2019 1543   LYMPHSABS 2.6 12/21/2014 1136   MONOABS 0.8 05/22/2019 1543   EOSABS 0.5 05/22/2019 1543   EOSABS 0.2 12/21/2014 1136   BASOSABS 0.1 05/22/2019 1543   BASOSABS 0.1 12/21/2014 1136    CMP     Component Value Date/Time   NA 139 05/10/2019 1617   NA 137 09/17/2015 1019   K 4.6 05/10/2019 1617   CL 105 05/10/2019 1617   CO2 28 05/10/2019 1617   GLUCOSE 76 05/10/2019 1617   BUN 11 05/10/2019 1617   BUN 8 09/17/2015 1019   CREATININE 0.88 05/10/2019 1617   CALCIUM 9.3 05/10/2019 1617   PROT 6.6 05/10/2019 1617   PROT 7.4 12/21/2014 1136   ALBUMIN 3.8 03/19/2016 0947   ALBUMIN 4.1 12/21/2014 1136   AST 34 (H) 05/10/2019 1617   ALT 34 (H) 05/10/2019 1617   ALKPHOS 55 03/19/2016 0947   BILITOT 0.3 05/10/2019 1617   BILITOT 0.7 12/21/2014 1136   GFRNONAA 86 05/10/2019 1617   GFRAA 99 05/10/2019 1617     Assessment and Plan: 1.  Leukocytosis, unspecified type   2. Tobacco abuse     Labs reviewed and discussed with patient. Smear showed unremarkable WBC morphology Flow cytometry negative for significant immunophenotypic abnormality. CBC showed WBC slightly elevated at 11.8, at patient's baseline. Discussed with patient that mild leukocytosis is likely secondary to tobacco use.  The increase of WBC at the last visit probably secondary to cortisone injection. Smoke cessation was discussed with her.  Follow Up Instructions: Discussed with patient that I will discharge her to follow-up with primary care provider.  I am happy to see her in the future if she develops new related concerns or symptoms   I discussed the assessment and treatment plan with the patient. The patient was provided an opportunity to ask questions and all were answered. The patient agreed with the plan and demonstrated an understanding of the instructions.  The patient was advised to call back or seek an in-person evaluation if the symptoms worsen or if the condition fails to improve as anticipated.   Earlie Server, MD 07/04/2019 7:59 AM

## 2019-07-07 ENCOUNTER — Ambulatory Visit (INDEPENDENT_AMBULATORY_CARE_PROVIDER_SITE_OTHER): Admitting: Podiatry

## 2019-07-07 ENCOUNTER — Other Ambulatory Visit: Payer: Self-pay

## 2019-07-07 ENCOUNTER — Encounter: Payer: Self-pay | Admitting: Podiatry

## 2019-07-07 VITALS — Temp 98.1°F

## 2019-07-07 DIAGNOSIS — M778 Other enthesopathies, not elsewhere classified: Secondary | ICD-10-CM | POA: Diagnosis not present

## 2019-07-10 ENCOUNTER — Encounter (INDEPENDENT_AMBULATORY_CARE_PROVIDER_SITE_OTHER): Payer: Self-pay | Admitting: Nurse Practitioner

## 2019-07-10 ENCOUNTER — Ambulatory Visit (INDEPENDENT_AMBULATORY_CARE_PROVIDER_SITE_OTHER): Admitting: Nurse Practitioner

## 2019-07-10 ENCOUNTER — Other Ambulatory Visit: Payer: Self-pay

## 2019-07-10 ENCOUNTER — Ambulatory Visit (INDEPENDENT_AMBULATORY_CARE_PROVIDER_SITE_OTHER)

## 2019-07-10 VITALS — BP 128/85 | HR 85 | Ht 63.0 in | Wt 243.0 lb

## 2019-07-10 DIAGNOSIS — M79605 Pain in left leg: Secondary | ICD-10-CM | POA: Diagnosis not present

## 2019-07-10 DIAGNOSIS — I872 Venous insufficiency (chronic) (peripheral): Secondary | ICD-10-CM | POA: Diagnosis not present

## 2019-07-10 DIAGNOSIS — I890 Lymphedema, not elsewhere classified: Secondary | ICD-10-CM | POA: Diagnosis not present

## 2019-07-10 DIAGNOSIS — M79604 Pain in right leg: Secondary | ICD-10-CM

## 2019-07-10 DIAGNOSIS — R0600 Dyspnea, unspecified: Secondary | ICD-10-CM

## 2019-07-10 DIAGNOSIS — R0609 Other forms of dyspnea: Secondary | ICD-10-CM

## 2019-07-10 NOTE — Progress Notes (Signed)
   HPI: 34 y.o. female presenting today for follow up evaluation of right lateral forefoot pain. She reports no improvement. She notes swelling of the dorsal foot and believes the CAM boot is worsening her symptoms. She reports soreness, aching and tenderness to the midfoot as well. She states the injections helped to temporarily alleviate her symptoms. Patient is here for further evaluation and treatment.   Past Medical History:  Diagnosis Date  . Cigarette smoker   . COVID-19 virus detected 03/21/2019  . IUD contraception 02/06/2016   Placed October 5, 0000000 by Ardeth Perfect, [place]  . Major depressive disorder, recurrent ([place]) 08/19/2015   zoloft made her cry; cymbalta made her too sleepy  . Migraine headache      Physical Exam: General: The patient is alert and oriented x3 in no acute distress.  Dermatology: Skin is warm, dry and supple bilateral lower extremities. Negative for open lesions or macerations.  Vascular: Palpable pedal pulses bilaterally. No edema or erythema noted. Capillary refill within normal limits.  Neurological: Epicritic and protective threshold grossly intact bilaterally.   Musculoskeletal Exam: Pain with palpation noted to the right dorsal midfoot. Range of motion within normal limits to all pedal and ankle joints bilateral. Muscle strength 5/5 in all groups bilateral.   Assessment: 1. Capsulitis right lateral forefoot - resolved  2. Right dorsal midfoot capsulitis    Plan of Care:  1. Patient evaluated.   2. Injection of 0.5 mLs Celestone Soluspan injected into the right dorsal midfoot.  3. Discontinue taking Meloxicam. Patient prefers OTC Motrin. Continue taking Motrin as needed.  4. Compression anklet dispensed.  5. Return to clinic as needed.   Works 12 hour shifts at [employer].       Edrick Kins, DPM [facility]  Dr. Edrick Kins, DPM    [address], [place]  [address]                Office [phone]  Fax [phone]

## 2019-07-11 ENCOUNTER — Encounter (INDEPENDENT_AMBULATORY_CARE_PROVIDER_SITE_OTHER): Payer: Self-pay | Admitting: Nurse Practitioner

## 2019-07-13 NOTE — Progress Notes (Signed)
SUBJECTIVE:  Patient ID: Olivia King, female    DOB: 08-02-1985, 34 y.o.   MRN: [id number] Chief Complaint  Patient presents with  . Follow-up    3 mo BLE VEN Reflux     HPI  Olivia King is a 34 y.o. female that presents today with concern for lower extremity edema.  The swelling has been ongoing for several months however she notices that it seems to have gotten worse over the last few months.  Initially the patient thought that the edema was coming from her IUD however after it being removed there was no improvement in her lower extremity edema.  The patient currently works as a Technical brewer and is very active throughout the day.  She utilizes medical grade 1 compression stockings regularly and states that this is helpful with her swelling however it is not controlled completely.  The patient does elevate regularly as well.  The patient states that taking her Lasix helps with her swelling and she takes it on an as-needed basis.  The patient states that she typically weighs herself daily and will notice that she gains 8 to 10 pounds in a day.  She notes that when this happens she gets worse swelling that leaves deep indentions in her leg as well as she notices that she gets shortness of breath.  However, once she takes her Lasix the shortness of breath is gone.  She denies any dyspnea at rest or orthopnea.  Today noninvasive studies reveal that the patient has no evidence of DVT or superficial venous thrombosis bilaterally.  The patient has deep venous reflux in the right common femoral vein.  The patient also has reflux in the left lower extremity in the great saphenous vein at the knee.  No evidence of superficial venous thrombosis bilaterally  Past Medical History:  Diagnosis Date  . Cigarette smoker   . COVID-19 virus detected 03/21/2019  . IUD contraception 02/06/2016   Placed October 5, 0000000 by Ardeth Perfect, [place]  . Major depressive disorder, recurrent ([place]) 08/19/2015     zoloft made her cry; cymbalta made her too sleepy  . Migraine headache     Past Surgical History:  Procedure Laterality Date  . CESAREAN SECTION  07/21/2002 &2012  . contraceptive implant      Social History   Socioeconomic History  . Marital status: Widowed    Spouse name: Not on file  . Number of children: 2  . Years of education: Not on file  . Highest education level: Associate degree: occupational, Hotel manager, or vocational program  Occupational History  . Occupation: CMA    Comment: [employer]  Tobacco Use  . Smoking status: Current Every Day Smoker    Packs/day: 0.75    Years: 9.00    Pack years: 6.75    Types: Cigarettes    Start date: 03/05/2011  . Smokeless tobacco: Never Used  Substance and Sexual Activity  . Alcohol use: Yes    Alcohol/week: 0.0 standard drinks  . Drug use: No  . Sexual activity: Yes    Partners: Male    Birth control/protection: I.U.D.    Comment: Patient has liletta IUD  Other Topics Concern  . Not on file  Social History Narrative   She is a widow, he died 07/20/2009 while he was deployed.    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 Difficulty of Paying Living Expenses:   Food  Insecurity: No Food Insecurity  . Worried About Charity fundraiser in the Last Year: Never true  . Ran Out of Food in the Last Year: Never true  Transportation Needs: No Transportation Needs  . Lack of Transportation (Medical): No  . Lack of Transportation (Non-Medical): No  Physical Activity: Inactive  . Days of Exercise per Week: 0 days  . Minutes of Exercise per Session: 0 min  Stress:   . Feeling of Stress :   Social Connections:   . Frequency of Communication with Friends and Family:   . Frequency of Social Gatherings with Friends and Family:   . Attends Religious Services:   . Active Member of Clubs or Organizations:   . Attends Archivist Meetings:   Marland Kitchen Marital Status:   Intimate  Partner Violence:   . Fear of Current or Ex-Partner:   . Emotionally Abused:   Marland Kitchen Physically Abused:   . Sexually Abused:     Family History  Problem Relation Age of Onset  . Hypertension Mother   . Cancer Mother        pancreatic  . Pancreatic cancer Mother   . Hypertension Brother   . Aneurysm Maternal Grandmother   . Heart attack Maternal Grandfather   . Hypertension Paternal Grandmother   . Heart failure Sister     Allergies  Allergen Reactions  . Imitrex [Sumatriptan] Anaphylaxis    Throat and tongue swelling   . Penicillins Rash  . Trintellix [Vortioxetine]     Tingling hands and blurred vision      Review of Systems   Review of Systems: Negative Unless Checked Constitutional: [] Weight loss  [] Fever  [] Chills Cardiac: [] Chest pain   []  Atrial Fibrillation  [] Palpitations   [] Shortness of breath when laying flat   [x] Shortness of breath with exertion. [] Shortness of breath at rest Vascular:  [] Pain in legs with walking   [] Pain in legs with standing [] Pain in legs when laying flat   [] Claudication    [] Pain in feet when laying flat    [] History of DVT   [] Phlebitis   [x] Swelling in legs   [] Varicose veins   [] Non-healing ulcers Pulmonary:   [] Uses home oxygen   [] Productive cough   [] Hemoptysis   [] Wheeze  [] COPD   [] Asthma Neurologic:  [] Dizziness   [] Seizures  [] Blackouts [] History of stroke   [] History of TIA  [] Aphasia   [] Temporary Blindness   [] Weakness or numbness in arm   [] Weakness or numbness in leg Musculoskeletal:   [] Joint swelling   [] Joint pain   [] Low back pain  []  History of Knee Replacement [] Arthritis [] back Surgeries  []  Spinal Stenosis    Hematologic:  [] Easy bruising  [] Easy bleeding   [] Hypercoagulable state   [] Anemic Gastrointestinal:  [] Diarrhea   [] Vomiting  [] Gastroesophageal reflux/heartburn   [] Difficulty swallowing. [] Abdominal pain Genitourinary:  [] Chronic kidney disease   [] Difficult urination  [] Anuric   [] Blood in urine [] Frequent  urination  [] Burning with urination   [] Hematuria Skin:  [] Rashes   [] Ulcers [] Wounds Psychological:  [x] History of anxiety   []  History of major depression  []  Memory Difficulties      OBJECTIVE:   Physical Exam  BP 128/85   Pulse 85   Ht 5\' 3"  (1.6 m)   Wt 243 lb (110.2 kg)   BMI 43.05 kg/m   Gen: WD/WN, NAD Head: [place]/AT, No temporalis wasting.  Ear/Nose/Throat: Hearing grossly intact, nares w/o erythema or drainage Eyes: PER, EOMI, sclera nonicteric.  Neck:  Supple, no masses.  No JVD.  Pulmonary:  Good air movement, no use of accessory muscles.  Cardiac: RRR Vascular:  1+ edema bilaterally Vessel Right Left  Dorsalis Pedis Palpable Palpable  Posterior Tibial Palpable Palpable   Gastrointestinal: soft, non-distended. No guarding/no peritoneal signs.  Musculoskeletal: M/S 5/5 throughout.  No deformity or atrophy.  Neurologic: Pain and light touch intact in extremities.  Symmetrical.  Speech is fluent. Motor exam as listed above. Psychiatric: Judgment intact, Mood & affect appropriate for pt's clinical situation. Dermatologic: No Venous rashes. No Ulcers Noted.  No changes consistent with cellulitis. Lymph : No Cervical lymphadenopathy, no lichenification or skin changes of chronic lymphedema.       ASSESSMENT AND PLAN:  1. Lymphedema Recommend:  No surgery or intervention at this point in time.    I have reviewed my previous discussion with the patient regarding swelling and why it causes symptoms.  Patient will continue wearing graduated compression stockings class 1 (20-30 mmHg) on a daily basis. The patient will  beginning wearing the stockings first thing in the morning and removing them in the evening. The patient is instructed specifically not to sleep in the stockings.    In addition, behavioral modification including several periods of elevation of the lower extremities during the day will be continued.  This was reviewed with the patient during the initial  visit.  The patient will also continue routine exercise, especially walking on a daily basis as was discussed during the initial visit.    Despite conservative treatments for at least including graduated compression therapy class 1 and behavioral modification including exercise and elevation the patient  has not obtained adequate control of the lymphedema.  The patient still has stage 3 lymphedema and therefore, I believe that a lymph pump should be added to improve the control of the patient's lymphedema.  Additionally, a lymph pump is warranted because it will reduce the risk of cellulitis and ulceration in the future.  Patient should follow-up in six months    2. Dyspnea on exertion The patient symptoms of shortness of breath that get better after taking her Lasix in addition to the 8 to 10 pound weight gain in a day as well as the description of pitting edema are concerning for possible cardiac involvement.  Because the patient currently has difficulties with swelling in addition to her symptoms, we will refer to cardiology to ensure there is no cardiac component. - Ambulatory referral to Cardiology   Current Outpatient Medications on File Prior to Visit  Medication Sig Dispense Refill  . ARIPiprazole (ABILIFY) 5 MG tablet Take 1 tablet (5 mg total) by mouth daily. 90 tablet 0  . butalbital-acetaminophen-caffeine (FIORICET) 50-325-40 MG tablet Take 1-2 tablets by mouth every 12 (twelve) hours as needed for headache. No more than 3 in one day. 10 tablet 0  . cholecalciferol (VITAMIN D) 1000 units tablet Take 1,000 Units by mouth daily.    Marland Kitchen escitalopram (LEXAPRO) 10 MG tablet Take 1 tablet (10 mg total) by mouth daily. 90 tablet 0  . fluticasone (FLONASE) 50 MCG/ACT nasal spray Place 2 sprays into both nostrils daily. 16 g 2  . furosemide (LASIX) 20 MG tablet Take 1 tablet (20 mg total) by mouth daily as needed for fluid or edema. 30 tablet 0  . hydrOXYzine (ATARAX/VISTARIL) 10 MG tablet  Take 1 tablet (10 mg total) by mouth at bedtime as needed. 90 tablet 0  . ipratropium (ATROVENT) 0.03 % nasal spray ipratropium bromide 21 mcg (  0.03 %) nasal spray    . loratadine (CLARITIN) 10 MG tablet Take 1 tablet (10 mg total) by mouth daily. (Patient taking differently: Take 10 mg by mouth daily as needed. ) 90 tablet 0  . meloxicam (MOBIC) 15 MG tablet TAKE 1 TABLET(15 MG) BY MOUTH DAILY. NEED TO FOLLOW UP WITH MD BEFORE FURTHER REFILLS 90 tablet 0  . Multiple Vitamin (MULTI-VITAMINS) TABS Take by mouth.    Marland Kitchen olopatadine (PATANOL) 0.1 % ophthalmic solution Place 1 drop into both eyes 2 (two) times daily. 5 mL 2  . phentermine (ADIPEX-P) 37.5 MG tablet Take 37.5 mg by mouth daily.    Marland Kitchen triamcinolone cream (KENALOG) 0.1 % APPLY TO ACTIVE LESIONS EVERY DAY     No current facility-administered medications on file prior to visit.    There are no Patient Instructions on file for this visit. No follow-ups on file.   Kris Hartmann, NP  This note was completed with Sales executive.  Any errors are purely unintentional.

## 2019-07-18 ENCOUNTER — Telehealth: Payer: Self-pay

## 2019-07-18 DIAGNOSIS — M778 Other enthesopathies, not elsewhere classified: Secondary | ICD-10-CM

## 2019-07-18 NOTE — Telephone Encounter (Signed)
Please order MRI w/out contrast RT foot. - Thanks, Dr. Amalia Hailey

## 2019-07-18 NOTE — Telephone Encounter (Signed)
Patient called, stated that 2 days after the last injection was given, the pain came back and has been worse ever since.  She says she has tried soaking in epson salt, Ibuprofen, Meloxicam, ice, elevation, boot, everything and nothing is working.  Please advise on what you want her to do next.  Thanks!

## 2019-07-19 NOTE — Telephone Encounter (Signed)
I spoke with Olivia King at [facility] pre service center, per [facility] website, no precert required for MRI.   Patient has been notified and will call the scheduling department and set up appt to her convenience.

## 2019-07-31 ENCOUNTER — Ambulatory Visit (INDEPENDENT_AMBULATORY_CARE_PROVIDER_SITE_OTHER): Admitting: Cardiology

## 2019-07-31 ENCOUNTER — Encounter: Payer: Self-pay | Admitting: Cardiology

## 2019-07-31 ENCOUNTER — Ambulatory Visit
Admission: RE | Admit: 2019-07-31 | Discharge: 2019-07-31 | Disposition: A | Discharge status: Home / Self Care | Source: Ambulatory Visit | Attending: Podiatry | Admitting: Podiatry

## 2019-07-31 ENCOUNTER — Other Ambulatory Visit: Payer: Self-pay

## 2019-07-31 VITALS — BP 124/82 | HR 98 | Ht 63.0 in | Wt 249.1 lb

## 2019-07-31 DIAGNOSIS — F17200 Nicotine dependence, unspecified, uncomplicated: Secondary | ICD-10-CM

## 2019-07-31 DIAGNOSIS — R0609 Other forms of dyspnea: Secondary | ICD-10-CM

## 2019-07-31 DIAGNOSIS — R0600 Dyspnea, unspecified: Secondary | ICD-10-CM | POA: Diagnosis not present

## 2019-07-31 DIAGNOSIS — M778 Other enthesopathies, not elsewhere classified: Secondary | ICD-10-CM | POA: Diagnosis present

## 2019-07-31 DIAGNOSIS — M7989 Other specified soft tissue disorders: Secondary | ICD-10-CM

## 2019-07-31 MED ORDER — TORSEMIDE 20 MG PO TABS: 40.0000 mg | ORAL_TABLET | Freq: Every day | ORAL | 2 refills | Status: DC

## 2019-07-31 NOTE — Progress Notes (Signed)
Cardiology Office Note:    Date:  07/31/2019   ID:  Olivia King, DOB Mar 27, 1986, MRN [id number]  PCP:  Steele Sizer, MD  Cardiologist:  Kate Sable, MD  Electrophysiologist:  None   Referring MD: Kris Hartmann, NP   Chief Complaint  Patient presents with  . New Patient (Initial Visit)    Referred by PCP for SOB on exertion and swelling in ankles. Meds reviewed verbally with patient.    Olivia King is a 34 y.o. female who is being seen today for the evaluation of dyspnea on exertion and edema at the request of Kris Hartmann, NP.  History of Present Illness:    Olivia King is a 34 y.o. female with a hx of depression, current smoker x 44yrs who presents due to dyspnea on exertion and lower extremity edema.  Patient states having worsening shortness of breath with activity over the past year.  Denies any symptoms at rest.  She also endorses lower extremity edema.  She denies any chest discomfort with activity or at rest.  Denies orthopnea.  Endorses snoring and daytime somnolence.  She was started on Lasix which originally improved her shortness of breath, however  Lasix has not worked lately.  Her normal baseline weight is about 220 pounds.  She denies any history of heart disease.  Past Medical History:  Diagnosis Date  . Cigarette smoker   . COVID-19 virus detected 03/21/2019  . IUD contraception 02/06/2016   Placed October 5, 0000000 by Ardeth Perfect, [place]  . Major depressive disorder, recurrent ([place]) 08/19/2015   zoloft made her cry; cymbalta made her too sleepy  . Migraine headache     Past Surgical History:  Procedure Laterality Date  . CESAREAN SECTION  2004 &2012  . contraceptive implant      Current Medications: Current Meds  Medication Sig  . ARIPiprazole (ABILIFY) 5 MG tablet Take 1 tablet (5 mg total) by mouth daily.  . butalbital-acetaminophen-caffeine (FIORICET) 50-325-40 MG tablet Take 1-2 tablets by mouth every 12 (twelve)  hours as needed for headache. No more than 3 in one day.  . cholecalciferol (VITAMIN D) 1000 units tablet Take 1,000 Units by mouth daily.  Marland Kitchen escitalopram (LEXAPRO) 10 MG tablet Take 1 tablet (10 mg total) by mouth daily.  . fluticasone (FLONASE) 50 MCG/ACT nasal spray Place 2 sprays into both nostrils daily.  . hydrOXYzine (ATARAX/VISTARIL) 10 MG tablet Take 1 tablet (10 mg total) by mouth at bedtime as needed.  Marland Kitchen ipratropium (ATROVENT) 0.03 % nasal spray ipratropium bromide 21 mcg (0.03 %) nasal spray  . loratadine (CLARITIN) 10 MG tablet Take 1 tablet (10 mg total) by mouth daily. (Patient taking differently: Take 10 mg by mouth daily as needed. )  . meloxicam (MOBIC) 15 MG tablet TAKE 1 TABLET(15 MG) BY MOUTH DAILY. NEED TO FOLLOW UP WITH MD BEFORE FURTHER REFILLS  . Multiple Vitamin (MULTI-VITAMINS) TABS Take by mouth.  Marland Kitchen olopatadine (PATANOL) 0.1 % ophthalmic solution Place 1 drop into both eyes 2 (two) times daily.  . [DISCONTINUED] furosemide (LASIX) 20 MG tablet Take 1 tablet (20 mg total) by mouth daily as needed for fluid or edema.     Allergies:   Imitrex [sumatriptan], Penicillins, and Trintellix [vortioxetine]   Social History   Socioeconomic History  . Marital status: Widowed    Spouse name: Not on file  . Number of children: 2  . Years of education: Not on file  . Highest education level: Associate degree: occupational,  technical, or vocational program  Occupational History  . Occupation: CMA    Comment: [employer]  Tobacco Use  . Smoking status: Current Every Day Smoker    Packs/day: 0.75    Years: 9.00    Pack years: 6.75    Types: Cigarettes    Start date: 03/05/2011  . Smokeless tobacco: Never Used  Substance and Sexual Activity  . Alcohol use: Yes    Alcohol/week: 0.0 standard drinks  . Drug use: No  . Sexual activity: Yes    Partners: Male    Birth control/protection: I.U.D.    Comment: Patient has liletta IUD  Other Topics Concern  . Not on file    Social History Narrative   She is a widow, he died 07/22/09 while he was deployed.    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 Difficulty of Paying Living Expenses:   Food Insecurity: No Food Insecurity  . Worried About Charity fundraiser in the Last Year: Never true  . Ran Out of Food in the Last Year: Never true  Transportation Needs: No Transportation Needs  . Lack of Transportation (Medical): No  . Lack of Transportation (Non-Medical): No  Physical Activity: Inactive  . Days of Exercise per Week: 0 days  . Minutes of Exercise per Session: 0 min  Stress:   . Feeling of Stress :   Social Connections:   . Frequency of Communication with Friends and Family:   . Frequency of Social Gatherings with Friends and Family:   . Attends Religious Services:   . Active Member of Clubs or Organizations:   . Attends Archivist Meetings:   Marland Kitchen Marital Status:      Family History: The patient's family history includes Aneurysm in her maternal grandmother; Cancer in her mother; Heart attack in her maternal grandfather; Heart failure in her sister; Hypertension in her brother, mother, and paternal grandmother; Pancreatic cancer in her mother.  ROS:   Please see the history of present illness.     All other systems reviewed and are negative.  EKGs/Labs/Other Studies Reviewed:    The following studies were reviewed today:   EKG:  EKG is  ordered today.  The ekg ordered today demonstrates normal sinus rhythm, incomplete right bundle branch block.  Recent Labs: 05/10/2019: ALT 34; BUN 11; Creat 0.88; Potassium 4.6; Sodium 139; TSH 2.17 05/22/2019: Hemoglobin 12.1; Platelets 329  Recent Lipid Panel    Component Value Date/Time   CHOL 178 04/09/2017 1555   CHOL 176 12/21/2014 1136   TRIG 94 04/09/2017 1555   HDL 49 (L) 04/09/2017 1555   HDL 47 12/21/2014 1136   CHOLHDL 3.6 04/09/2017 1555   VLDL 11 03/19/2016 0947    LDLCALC 110 (H) 04/09/2017 1555    Physical Exam:    VS:  BP 124/82 (BP Location: Right Arm, Patient Position: Sitting, Cuff Size: Normal)   Pulse 98   Ht 5\' 3"  (1.6 m)   Wt 249 lb 2 oz (113 kg)   BMI 44.13 kg/m     Wt Readings from Last 3 Encounters:  07/31/19 249 lb 2 oz (113 kg)  07/10/19 243 lb (110.2 kg)  05/22/19 236 lb 4.8 oz (107.2 kg)     GEN:  Well nourished, well developed in no acute distress HEENT: Normal NECK: No JVD; No carotid bruits LYMPHATICS: No lymphadenopathy CARDIAC: RRR, no murmurs, rubs, gallops RESPIRATORY:  Clear to auscultation  without rales, wheezing or rhonchi  ABDOMEN: Soft, non-tender, non-distended MUSCULOSKELETAL:  No edema; No deformity  SKIN: Warm and dry NEUROLOGIC:  Alert and oriented x 3 PSYCHIATRIC:  Normal affect   ASSESSMENT:    1. DOE (dyspnea on exertion)   2. Localized swelling of lower extremity   3. Smoking   4. Morbid obesity ([place])    PLAN:    In order of problems listed above:  1. Patient with dyspnea on exertion.  Risk factor of obesity, current smoker.  We will get an echocardiogram to evaluate any structural abnormalities.  Likely has diastolic dysfunction in light of morbid obesity. 2. Worsening lower extremity edema.  Oral Lasix not working likely due to poor absorption.  Stop Lasix.  Start torsemide 40 mg daily.  Check BMP in 2 weeks. 3. History of smoking.  Smoking cessation advised. 4. Morbidly obese.  Weight loss advised.  Follow-up after echocardiogram.  This note was generated in part or whole with voice recognition software. Voice recognition is usually quite accurate but there are transcription errors that can and very often do occur. I apologize for any typographical errors that were not detected and corrected.  Medication Adjustments/Labs and Tests Ordered: Current medicines are reviewed at length with the patient today.  Concerns regarding medicines are outlined above.  Orders Placed This Encounter    Procedures  . Basic metabolic panel  . EKG 12-Lead  . ECHOCARDIOGRAM COMPLETE   Meds ordered this encounter  Medications  . torsemide (DEMADEX) 20 MG tablet    Sig: Take 2 tablets (40 mg total) by mouth daily.    Dispense:  60 tablet    Refill:  2    Patient Instructions  Medication Instructions:  Your physician has recommended you make the following change in your medication:  1- STOP Lasix. 2- START Torsemide 40 mg (2 tablet) by mouth once a day.  *If you need a refill on your cardiac medications before your next appointment, please call your pharmacy*   Lab Work: Your physician recommends that you return for lab work in: 2 weeks  ~ 08/14/19 for [facility] at [facility]. - Please go to the [facility]. You will check in at the front desk to the right as you walk into the atrium. Valet Parking is offered if needed. - No appointment needed. You may go any day between 7 am and 6 pm.  If you have labs (blood work) drawn today and your tests are completely normal, you will receive your results only by: Marland Kitchen MyChart Message (if you have MyChart) OR . A paper copy in the mail If you have any lab test that is abnormal or we need to change your treatment, we will call you to review the results.   Testing/Procedures: Your physician has requested that you have an echocardiogram. Echocardiography is a painless test that uses sound waves to create images of your heart. It provides your doctor with information about the size and shape of your heart and how well your heart's chambers and valves are working. This procedure takes approximately one hour. There are no restrictions for this procedure. You may get an IV, if needed, to receive an ultrasound enhancing agent through to better visualize your heart.   Follow-Up: At [facility], you and your health needs are our priority.  As part of our continuing mission to provide you with exceptional heart care, we have created  designated Provider Care Teams.  These Care Teams include your  primary Cardiologist (physician) and Advanced Practice Providers (APPs -  Physician Assistants and Nurse Practitioners) who all work together to provide you with the care you need, when you need it.  We recommend signing up for the patient portal called "MyChart".  Sign up information is provided on this After Visit Summary.  MyChart is used to connect with patients for Virtual Visits (Telemedicine).  Patients are able to view lab/test results, encounter notes, upcoming appointments, etc.  Non-urgent messages can be sent to your provider as well.   To learn more about what you can do with MyChart, go to [email or web address].    Your next appointment:   After echo completed.   The format for your next appointment:   In Person  Provider:   Kate Sable, MD    Echocardiogram An echocardiogram is a procedure that uses painless sound waves (ultrasound) to produce an image of the heart. Images from an echocardiogram can provide important information about:  Signs of coronary artery disease (CAD).  Aneurysm detection. An aneurysm is a weak or damaged part of an artery wall that bulges out from the normal force of blood pumping through the body.  Heart size and shape. Changes in the size or shape of the heart can be associated with certain conditions, including heart failure, aneurysm, and CAD.  Heart muscle function.  Heart valve function.  Signs of a past heart attack.  Fluid buildup around the heart.  Thickening of the heart muscle.  A tumor or infectious growth around the heart valves. Tell a health care provider about:  Any allergies you have.  All medicines you are taking, including vitamins, herbs, eye drops, creams, and over-the-counter medicines.  Any blood disorders you have.  Any surgeries you have had.  Any medical conditions you have.  Whether you are pregnant or may be pregnant. What are the  risks? Generally, this is a safe procedure. However, problems may occur, including:  Allergic reaction to dye (contrast) that may be used during the procedure. What happens before the procedure? No specific preparation is needed. You may eat and drink normally. What happens during the procedure?   An IV tube may be inserted into one of your veins.  You may receive contrast through this tube. A contrast is an injection that improves the quality of the pictures from your heart.  A gel will be applied to your chest.  A wand-like tool (transducer) will be moved over your chest. The gel will help to transmit the sound waves from the transducer.  The sound waves will harmlessly bounce off of your heart to allow the heart images to be captured in real-time motion. The images will be recorded on a computer. The procedure may vary among health care providers and hospitals. What happens after the procedure?  You may return to your normal, everyday life, including diet, activities, and medicines, unless your health care provider tells you not to do that. Summary  An echocardiogram is a procedure that uses painless sound waves (ultrasound) to produce an image of the heart.  Images from an echocardiogram can provide important information about the size and shape of your heart, heart muscle function, heart valve function, and fluid buildup around your heart.  You do not need to do anything to prepare before this procedure. You may eat and drink normally.  After the echocardiogram is completed, you may return to your normal, everyday life, unless your health care provider tells you not to do that. This information is  not intended to replace advice given to you by your health care provider. Make sure you discuss any questions you have with your health care provider. Document Revised: 07/28/2018 Document Reviewed: 05/09/2016 Elsevier Patient Education  2020 [place], Kate Sable, MD  07/31/2019 9:57 AM    [place]

## 2019-07-31 NOTE — Patient Instructions (Signed)
Medication Instructions:  Your physician has recommended you make the following change in your medication:  1- STOP Lasix. 2- START Torsemide 40 mg (2 tablet) by mouth once a day.  *If you need a refill on your cardiac medications before your next appointment, please call your pharmacy*   Lab Work: Your physician recommends that you return for lab work in: 2 weeks  ~ 08/14/19 for BMET at the [facility]. - Please go to the [facility]. You will check in at the front desk to the right as you walk into the atrium. Valet Parking is offered if needed. - No appointment needed. You may go any day between 7 am and 6 pm.  If you have labs (blood work) drawn today and your tests are completely normal, you will receive your results only by: Marland Kitchen MyChart Message (if you have MyChart) OR . A paper copy in the mail If you have any lab test that is abnormal or we need to change your treatment, we will call you to review the results.   Testing/Procedures: Your physician has requested that you have an echocardiogram. Echocardiography is a painless test that uses sound waves to create images of your heart. It provides your doctor with information about the size and shape of your heart and how well your heart's chambers and valves are working. This procedure takes approximately one hour. There are no restrictions for this procedure. You may get an IV, if needed, to receive an ultrasound enhancing agent through to better visualize your heart.   Follow-Up: At [facility], you and your health needs are our priority.  As part of our continuing mission to provide you with exceptional heart care, we have created designated Provider Care Teams.  These Care Teams include your primary Cardiologist (physician) and Advanced Practice Providers (APPs -  Physician Assistants and Nurse Practitioners) who all work together to provide you with the care you need, when you need it.  We recommend signing up for the  patient portal called "MyChart".  Sign up information is provided on this After Visit Summary.  MyChart is used to connect with patients for Virtual Visits (Telemedicine).  Patients are able to view lab/test results, encounter notes, upcoming appointments, etc.  Non-urgent messages can be sent to your provider as well.   To learn more about what you can do with MyChart, go to [email or web address].    Your next appointment:   After echo completed.   The format for your next appointment:   In Person  Provider:   Kate Sable, MD    Echocardiogram An echocardiogram is a procedure that uses painless sound waves (ultrasound) to produce an image of the heart. Images from an echocardiogram can provide important information about:  Signs of coronary artery disease (CAD).  Aneurysm detection. An aneurysm is a weak or damaged part of an artery wall that bulges out from the normal force of blood pumping through the body.  Heart size and shape. Changes in the size or shape of the heart can be associated with certain conditions, including heart failure, aneurysm, and CAD.  Heart muscle function.  Heart valve function.  Signs of a past heart attack.  Fluid buildup around the heart.  Thickening of the heart muscle.  A tumor or infectious growth around the heart valves. Tell a health care provider about:  Any allergies you have.  All medicines you are taking, including vitamins, herbs, eye drops, creams, and over-the-counter medicines.  Any blood disorders  you have.  Any surgeries you have had.  Any medical conditions you have.  Whether you are pregnant or may be pregnant. What are the risks? Generally, this is a safe procedure. However, problems may occur, including:  Allergic reaction to dye (contrast) that may be used during the procedure. What happens before the procedure? No specific preparation is needed. You may eat and drink normally. What happens during the  procedure?   An IV tube may be inserted into one of your veins.  You may receive contrast through this tube. A contrast is an injection that improves the quality of the pictures from your heart.  A gel will be applied to your chest.  A wand-like tool (transducer) will be moved over your chest. The gel will help to transmit the sound waves from the transducer.  The sound waves will harmlessly bounce off of your heart to allow the heart images to be captured in real-time motion. The images will be recorded on a computer. The procedure may vary among health care providers and hospitals. What happens after the procedure?  You may return to your normal, everyday life, including diet, activities, and medicines, unless your health care provider tells you not to do that. Summary  An echocardiogram is a procedure that uses painless sound waves (ultrasound) to produce an image of the heart.  Images from an echocardiogram can provide important information about the size and shape of your heart, heart muscle function, heart valve function, and fluid buildup around your heart.  You do not need to do anything to prepare before this procedure. You may eat and drink normally.  After the echocardiogram is completed, you may return to your normal, everyday life, unless your health care provider tells you not to do that. This information is not intended to replace advice given to you by your health care provider. Make sure you discuss any questions you have with your health care provider. Document Revised: 07/28/2018 Document Reviewed: 05/09/2016 Elsevier Patient Education  [place].

## 2019-08-01 ENCOUNTER — Telehealth: Payer: Self-pay | Admitting: *Deleted

## 2019-08-01 NOTE — Telephone Encounter (Signed)
[facility] calling to give results.

## 2019-08-01 NOTE — Telephone Encounter (Signed)
Results have been given to Dr. Amalia Hailey for review

## 2019-08-07 ENCOUNTER — Other Ambulatory Visit: Payer: Self-pay | Admitting: Podiatry

## 2019-08-07 NOTE — Telephone Encounter (Signed)
Rx for Meloxicam sent to [facility]

## 2019-08-08 ENCOUNTER — Ambulatory Visit (INDEPENDENT_AMBULATORY_CARE_PROVIDER_SITE_OTHER): Admitting: Podiatry

## 2019-08-08 ENCOUNTER — Encounter: Payer: Self-pay | Admitting: *Deleted

## 2019-08-08 ENCOUNTER — Other Ambulatory Visit: Payer: Self-pay

## 2019-08-08 DIAGNOSIS — S92301D Fracture of unspecified metatarsal bone(s), right foot, subsequent encounter for fracture with routine healing: Secondary | ICD-10-CM | POA: Diagnosis not present

## 2019-08-10 NOTE — Progress Notes (Signed)
   HPI: 34 y.o. female presenting today for follow up evaluation of right midfoot pain. She states the pain is relatively unchanged. She has been working 12 hour shifts which exacerbates the pain. She has been taking OTC Motrin for treatment. She had an MRI done on 07/31/2019 and is here for the results. Patient is here for further evaluation and treatment.   Past Medical History:  Diagnosis Date  . Cigarette smoker   . COVID-19 virus detected 03/21/2019  . IUD contraception 02/06/2016   Placed October 5, 0000000 by Ardeth Perfect, [place]  . Major depressive disorder, recurrent ([place]) 08/19/2015   zoloft made her cry; cymbalta made her too sleepy  . Migraine headache      Physical Exam: General: The patient is alert and oriented x3 in no acute distress.  Dermatology: Skin is warm, dry and supple bilateral lower extremities. Negative for open lesions or macerations.  Vascular: Palpable pedal pulses bilaterally. No edema or erythema noted. Capillary refill within normal limits.  Neurological: Epicritic and protective threshold grossly intact bilaterally.   Musculoskeletal Exam: Pain with palpation noted to the right 2nd metatarsal. Range of motion within normal limits to all pedal and ankle joints bilateral. Muscle strength 5/5 in all groups bilateral.   MRI Impression done on 07/31/2019:  1. Acute nondisplaced fracture of the second metatarsal base with intra-articular extension to the second TMT joint. 2. Edema within the plantar band of the Lisfranc ligament is favored to be reactive or represent a mild sprain. No Lisfranc ligament tear. 3. First, second, and third intermetatarsal bursitis.    Assessment: 1. Fracture of the 2nd metatarsal base, closed, nondisplaced right   Plan of Care:  1. Patient evaluated. MRI reviewed.  2. Post op shoe dispensed.  3. Prescription for Vicodin 5/325 mg provided to patient.  4. Continue taking OTC Motrin 800 mg BID.  5. Return to clinic  in 4 weeks.   Works 12 hour shifts at [employer].     Edrick Kins, DPM [facility]  Dr. Edrick Kins, DPM    [address], [place] [address]                Office [phone]  Fax [phone]

## 2019-08-18 ENCOUNTER — Encounter: Payer: Self-pay | Admitting: Family Medicine

## 2019-08-18 ENCOUNTER — Other Ambulatory Visit: Payer: Self-pay

## 2019-08-18 ENCOUNTER — Ambulatory Visit (INDEPENDENT_AMBULATORY_CARE_PROVIDER_SITE_OTHER): Admitting: Family Medicine

## 2019-08-18 DIAGNOSIS — R0609 Other forms of dyspnea: Secondary | ICD-10-CM

## 2019-08-18 DIAGNOSIS — R000 Tachycardia, unspecified: Secondary | ICD-10-CM

## 2019-08-18 DIAGNOSIS — F5081 Binge eating disorder: Secondary | ICD-10-CM

## 2019-08-18 DIAGNOSIS — R0600 Dyspnea, unspecified: Secondary | ICD-10-CM

## 2019-08-18 DIAGNOSIS — I890 Lymphedema, not elsewhere classified: Secondary | ICD-10-CM

## 2019-08-18 DIAGNOSIS — J301 Allergic rhinitis due to pollen: Secondary | ICD-10-CM

## 2019-08-18 MED ORDER — FLUTICASONE PROPIONATE 50 MCG/ACT NA SUSP: 2.0000 | Freq: Every day | NASAL | 2 refills | Status: DC

## 2019-08-18 MED ORDER — LISDEXAMFETAMINE DIMESYLATE 20 MG PO CAPS: 20.0000 mg | ORAL_CAPSULE | Freq: Every day | ORAL | 0 refills | Status: DC

## 2019-08-18 NOTE — Addendum Note (Signed)
Addended by: Steele Sizer F on: 08/18/2019 10:23 AM   Modules accepted: Orders

## 2019-08-18 NOTE — Progress Notes (Addendum)
Name: Olivia King   MRN: [id number]    DOB: 1986-03-14   Date:08/18/2019       Progress Note  Subjective  Chief Complaint  Chief Complaint  Patient presents with  . Obesity    She has concerns about her weight. She is working with a Clinical research associate, watches what she eats, but unable to loose the weight  . Depression    HPI  Lymphedema: she went to vascular surgeon, and was advised to use compression stocking hoses and because of sob advised to see cardiologist.   SOB with activity: seen by Dr. Phillis Knack at [facility]. She will have an echo done, was advised to stop lasix and start Demadex, she states she lost 13 lbs within 12 hours of taking first dose. She denies PND, orthopnea, she does snore at night ( worse with higher weight) and feels tired during the day  Morbid obesity: she states as a teenager she was in the 160 lbs range, but after her son was born in 2004 her weight stayed between 189-203 lbs , had her daughter in 2012 her weight went up to 270 lbs, but after her birth she was able to go back down to 203 lbs. She states in 2016 she took some otc diet pills and was going to the gym and her weight dropped about 40 lbs in a few months, she was back to 165 lbs. She states after that stress made her eat more , she stopped going to the gym She states she can binge eat. She is able to avoid eating during the day - because she is so busy, but at night when upset with her children or not feeling well she eats to get comfort She states she feels out of control and is unable to stop eating, about twice a week, she feels very guilty afterwards. She states when she overeats she takes magnesium to help her have a bowel movement. She does not make herself vomit  Seasonal allergic rhinitis: feeling worse lately, would like a refill of Flonase  Patient Active Problem List   Diagnosis Date Noted  . Lymphedema 04/10/2019  . Chronic venous insufficiency 04/10/2019  . Leg pain 04/10/2019  .  Bilateral carpal tunnel syndrome 11/04/2018  . Morbid obesity ([place]) 06/14/2017  . Anxiety 03/24/2017  . Contraception management 01/06/2016  . Glucosuria 09/17/2015  . Major depressive disorder, recurrent ([place]) 08/19/2015  . Benign neoplasm of skin of nose 07/28/2015  . Low serum vitamin D 12/24/2014  . Migraine with aura 12/21/2014    Past Surgical History:  Procedure Laterality Date  . CESAREAN SECTION  2004 &2012  . contraceptive implant      Family History  Problem Relation Age of Onset  . Hypertension Mother   . Cancer Mother        pancreatic  . Pancreatic cancer Mother   . Hypertension Brother   . Aneurysm Maternal Grandmother   . Heart attack Maternal Grandfather   . Hypertension Paternal Grandmother   . Heart failure Sister     Social History   Tobacco Use  . Smoking status: Current Every Day Smoker    Packs/day: 0.75    Years: 9.00    Pack years: 6.75    Types: Cigarettes    Start date: 03/05/2011  . Smokeless tobacco: Never Used  Substance Use Topics  . Alcohol use: Yes    Alcohol/week: 0.0 standard drinks     Current Outpatient Medications:  .  ARIPiprazole (ABILIFY) 5 MG  tablet, Take 1 tablet (5 mg total) by mouth daily., Disp: 90 tablet, Rfl: 0 .  butalbital-acetaminophen-caffeine (FIORICET) 50-325-40 MG tablet, Take 1-2 tablets by mouth every 12 (twelve) hours as needed for headache. No more than 3 in one day., Disp: 10 tablet, Rfl: 0 .  cholecalciferol (VITAMIN D) 1000 units tablet, Take 1,000 Units by mouth daily., Disp: , Rfl:  .  escitalopram (LEXAPRO) 10 MG tablet, Take 1 tablet (10 mg total) by mouth daily., Disp: 90 tablet, Rfl: 0 .  fluticasone (FLONASE) 50 MCG/ACT nasal spray, Place 2 sprays into both nostrils daily., Disp: 16 g, Rfl: 2 .  hydrOXYzine (ATARAX/VISTARIL) 10 MG tablet, Take 1 tablet (10 mg total) by mouth at bedtime as needed., Disp: 90 tablet, Rfl: 0 .  ipratropium (ATROVENT) 0.03 % nasal spray, ipratropium bromide 21 mcg  (0.03 %) nasal spray, Disp: , Rfl:  .  loratadine (CLARITIN) 10 MG tablet, Take 1 tablet (10 mg total) by mouth daily. (Patient taking differently: Take 10 mg by mouth daily as needed. ), Disp: 90 tablet, Rfl: 0 .  meloxicam (MOBIC) 15 MG tablet, TAKE 1 TABLET(15 MG) BY MOUTH DAILY. FOLLOW UP WITH MEDICAL DOCTOR BEFORE FURTHER REFILLS, Disp: 90 tablet, Rfl: 0 .  Multiple Vitamin (MULTI-VITAMINS) TABS, Take by mouth., Disp: , Rfl:  .  olopatadine (PATANOL) 0.1 % ophthalmic solution, Place 1 drop into both eyes 2 (two) times daily., Disp: 5 mL, Rfl: 2 .  torsemide (DEMADEX) 20 MG tablet, Take 2 tablets (40 mg total) by mouth daily., Disp: 60 tablet, Rfl: 2  Allergies  Allergen Reactions  . Imitrex [Sumatriptan] Anaphylaxis    Throat and tongue swelling   . Penicillins Rash  . Trintellix [Vortioxetine]     Tingling hands and blurred vision     I personally reviewed active problem list, medication list, allergies, family history, social history with the patient/caregiver today.   ROS  Constitutional: Negative for fever or weight change.  Respiratory: Negative for cough and shortness of breath.   Cardiovascular: Negative for chest pain or palpitations.  Gastrointestinal: Negative for abdominal pain, no bowel changes.  Musculoskeletal: Negative for gait problem or joint swelling.  Skin: Negative for rash.  Neurological: Negative for dizziness or headache.  No other specific complaints in a complete review of systems (except as listed in HPI above).  Objective  Vitals:   08/18/19 0956  BP: 110/70  Pulse: (!) 110  Resp: 16  Temp: (!) 97.5 F (36.4 C)  TempSrc: Temporal  SpO2: 98%  Weight: 250 lb 3.2 oz (113.5 kg)  Height: '5\' 3"'$  (1.6 m)    Body mass index is 44.32 kg/m.  Physical Exam  Constitutional: Patient appears well-developed and well-nourished. Obese  No distress.  HEENT: head atraumatic, normocephalic, pupils equal and reactive to light Cardiovascular: Normal rate,  regular rhythm and normal heart sounds.  No murmur heard. No BLE edema. Pulmonary/Chest: Effort normal and breath sounds normal. No respiratory distress. Abdominal: Soft.  There is no tenderness. Psychiatric: Patient has a normal mood and affect. behavior is normal. Judgment and thought content normal.  Recent Results (from the past 2160 hour(s))  Flow cytometry panel-leukemia/lymphoma work-up     Status: None   Collection Time: 05/22/19  3:43 PM  Result Value Ref Range   PATH INTERP XXX-IMP Comment     Comment: No significant immunophenotypic abnormality detected   CLINICAL INFO Comment     Comment: (NOTE) Accompanying CBC dated 05/22/2019 shows: WBC count 11.8, Neu 7.2, Lym 3.2,  Mon 0.8.    Specimen Type Comment     Comment: Peripheral blood   ASSESSMENT OF LEUKOCYTES Comment     Comment: (NOTE) No monoclonal B cell population is detected. kappa:lambda ratio 1.5 There is no loss of, or aberrant expression of, the pan T cell antigens to suggest a neoplastic T cell process. CD4:CD8 ratio 2.8 A small population (6% of the T cells) of double positive (CD4+/CD8+) T cells is detected. Double positive T cells have been described in association with chronic viral infections, autoimmune disorders, chronic inflammatory disorders, and immunodeficiency states. No circulating blasts are detected. There is no immunophenotypic  evidence of abnormal myeloid maturation. Analysis of the leukocyte population shows: granulocytes 71%, monocytes 5%, lymphocytes 24%, blasts <0.1%, B cells 4%, T cells 19%, NK cells 1%.    % Viable Cells Comment     Comment: 96%   ANALYSIS AND GATING STRATEGY Comment     Comment: 8 color analysis with CD45/SSC gating   IMMUNOPHENOTYPING STUDY Comment     Comment: (NOTE) CD2       Normal         CD3       Normal CD4       Normal         CD5       Normal CD7       Normal         CD8       Normal CD10      Normal         CD11b     Normal CD13      Normal          CD14      Normal CD16      Normal         CD19      Normal CD20      Normal         CD33      Normal CD34      Normal         CD38      Normal CD45      Normal         CD56      Normal CD57      Normal         CD117     Normal HLA-DR    Normal         KAPPA     Normal LAMBDA    Normal         CD64      Normal    PATHOLOGIST NAME Comment     Comment: Henrietta Hoover, M.D.   COMMENT: Comment     Comment: (NOTE) Each antibody in this assay was utilized to assess for potential abnormalities of studied cell populations or to characterize identified abnormalities. This test was developed and its performance characteristics determined by [facility].  It has not been cleared or approved by the U.S. Food and Drug Administration. The FDA has determined that such clearance or approval is not necessary. This test is used for clinical purposes.  It should not be regarded as investigational or for research. Performed At: -[facility] [address] [place], [place] [id number] Katina Degree MDPhD QQ:[id number] Performed At: [facility] [place] [facility] [address] [place], [place] [id number] Katina Degree MDPhD IP:[id number]   CBC with Differential/Platelet     Status: Abnormal   Collection Time: 05/22/19  3:43 PM  Result Value Ref  Range   WBC 11.8 (H) 4.0 - 10.5 K/uL   RBC 4.07 3.87 - 5.11 MIL/uL   Hemoglobin 12.1 12.0 - 15.0 g/dL   HCT 37.8 36.0 - 46.0 %   MCV 92.9 80.0 - 100.0 fL   MCH 29.7 26.0 - 34.0 pg   MCHC 32.0 30.0 - 36.0 g/dL   RDW 12.7 11.5 - 15.5 %   Platelets 329 150 - 400 K/uL   nRBC 0.0 0.0 - 0.2 %   Neutrophils Relative % 61 %   Neutro Abs 7.2 1.7 - 7.7 K/uL   Lymphocytes Relative 27 %   Lymphs Abs 3.2 0.7 - 4.0 K/uL   Monocytes Relative 7 %   Monocytes Absolute 0.8 0.1 - 1.0 K/uL   Eosinophils Relative 4 %   Eosinophils Absolute 0.5 0.0 - 0.5 K/uL   Basophils Relative 1 %   Basophils Absolute 0.1 0.0 - 0.1 K/uL   Immature Granulocytes 0 %   Abs Immature Granulocytes 0.05  0.00 - 0.07 K/uL    Comment: Performed at [facility], [address] [place], [place] [address]  Technologist smear review     Status: None   Collection Time: 05/22/19  3:45 PM  Result Value Ref Range   WBC Morphology UNREMARKBLE    RBC Morphology UNREMARKABLE    Tech Review PLATELETS APPEAR ADEQUATE     Comment: Normal platelet morphology Performed at [facility], [address] [place], [place] [address]       PHQ2/9: Depression screen [facility] 2/9 08/18/2019 06/23/2019 05/10/2019 03/29/2019 02/15/2019  Decreased Interest 0 0 0 0 3  Down, Depressed, Hopeless 1 0 0 0 1  PHQ - 2 Score 1 0 0 0 4  Altered sleeping 0 0 0 0 0  Tired, decreased energy 0 0 0 0 1  Change in appetite 0 0 0 1 0  Feeling bad or failure about yourself  3 0 0 0 2  Trouble concentrating 2 1 0 0 3  Moving slowly or fidgety/restless 0 1 0 0 3  Suicidal thoughts 0 0 0 0 0  PHQ-9 Score 6 2 0 1 13  Difficult doing work/chores - Not difficult at all Not difficult at all Not difficult at all Somewhat difficult  Some recent data might be hidden    phq 9 is positive   Fall Risk: Fall Risk  06/23/2019 05/10/2019 02/15/2019 12/16/2018 08/30/2018  Falls in the past year? 0 0 0 0 0  Number falls in past yr: 0 0 0 0 0  Injury with Fall? 0 0 0 0 0     Functional Status Survey: Is the patient deaf or have difficulty hearing?: No Does the patient have difficulty seeing, even when wearing glasses/contacts?: No Does the patient have difficulty concentrating, remembering, or making decisions?: No Does the patient have difficulty walking or climbing stairs?: No Does the patient have difficulty dressing or bathing?: No Does the patient have difficulty doing errands alone such as visiting a doctor's office or shopping?: No    Assessment & Plan  1. Morbid obesity (HCC)  - lisdexamfetamine (VYVANSE) 20 MG capsule; Take 1 capsule (20 mg total) by mouth daily.  Dispense: 30 capsule; Refill: 0  2.  Lymphedema  Needs to wear compression stocking hoses   3. Dyspnea on exertion  Keep follow up with cardiologist  4. Binge eating disorder  She will try to see psychiatrist  - lisdexamfetamine (VYVANSE) 20 MG capsule; Take 1 capsule (20 mg total)  by mouth daily.  Dispense: 30 capsule; Refill: 0  5. Tachycardia  Seeing cardiologist, monitor heart rate at home since vyvanse may cause tachycardia    6. Seasonal allergic rhinitis due to pollen  - fluticasone (FLONASE) 50 MCG/ACT nasal spray; Place 2 sprays into both nostrils daily.  Dispense: 16 g; Refill: 2

## 2019-08-25 ENCOUNTER — Encounter: Payer: Self-pay | Admitting: *Deleted

## 2019-08-25 ENCOUNTER — Other Ambulatory Visit
Admission: RE | Admit: 2019-08-25 | Discharge: 2019-08-25 | Disposition: A | Discharge status: Home / Self Care | Source: Ambulatory Visit | Attending: Cardiology | Admitting: Cardiology

## 2019-08-25 DIAGNOSIS — R0600 Dyspnea, unspecified: Secondary | ICD-10-CM | POA: Insufficient documentation

## 2019-08-25 DIAGNOSIS — M7989 Other specified soft tissue disorders: Secondary | ICD-10-CM | POA: Diagnosis present

## 2019-08-25 DIAGNOSIS — R0609 Other forms of dyspnea: Secondary | ICD-10-CM

## 2019-08-25 LAB — BASIC METABOLIC PANEL
Anion gap: 9 (ref 5–15)
BUN: 14 mg/dL (ref 6–20)
CO2: 26 mmol/L (ref 22–32)
Calcium: 9.7 mg/dL (ref 8.9–10.3)
Chloride: 102 mmol/L (ref 98–111)
Creatinine, Ser: 1.04 mg/dL — ABNORMAL HIGH (ref 0.44–1.00)
GFR calc Af Amer: >60 mL/min (ref >60)
GFR calc non Af Amer: >60 mL/min (ref >60)
Glucose, Bld: 115 mg/dL — ABNORMAL HIGH (ref 70–99)
Potassium: 4.1 mmol/L (ref 3.5–5.1)
Sodium: 137 mmol/L (ref 135–145)

## 2019-08-28 ENCOUNTER — Telehealth: Payer: Self-pay | Admitting: *Deleted

## 2019-08-28 MED ORDER — TORSEMIDE 20 MG PO TABS: 40.0000 mg | ORAL_TABLET | ORAL | 2 refills | Status: DC

## 2019-08-28 NOTE — Telephone Encounter (Signed)
Kate Sable, MD  You 33 minutes ago (12:49 PM)   Okay to take torsemide 20 mg every 2 days. Keep follow-up appointment.   Routing comment

## 2019-08-28 NOTE — Telephone Encounter (Signed)
-----   Message from Kate Sable, MD sent at 08/25/2019  1:52 PM EDT ----- Creatinine okay.  Continue torsemide as prescribed.  Keep follow-up appointment.

## 2019-08-28 NOTE — Telephone Encounter (Signed)
Patient called back and verbalized understanding.

## 2019-08-28 NOTE — Telephone Encounter (Signed)
Results called to pt. Pt verbalized understanding. Patient reports she took torsemide 40 mg the first day on 4/13 after office visit. She lost 14 pounds over a 12 hour period. She felt much better though felt dehydrated and dizzy. Since then, she's been taking torsemide 20 mg every 2 days and feels this is helping her.  Routing to Dr. Garen Lah for review.

## 2019-09-04 ENCOUNTER — Ambulatory Visit (INDEPENDENT_AMBULATORY_CARE_PROVIDER_SITE_OTHER)

## 2019-09-04 ENCOUNTER — Other Ambulatory Visit: Payer: Self-pay

## 2019-09-04 DIAGNOSIS — R0600 Dyspnea, unspecified: Secondary | ICD-10-CM | POA: Diagnosis not present

## 2019-09-04 DIAGNOSIS — M7989 Other specified soft tissue disorders: Secondary | ICD-10-CM | POA: Diagnosis not present

## 2019-09-04 DIAGNOSIS — R0609 Other forms of dyspnea: Secondary | ICD-10-CM

## 2019-09-05 ENCOUNTER — Other Ambulatory Visit: Payer: Self-pay

## 2019-09-05 ENCOUNTER — Ambulatory Visit (INDEPENDENT_AMBULATORY_CARE_PROVIDER_SITE_OTHER): Admitting: Podiatry

## 2019-09-05 DIAGNOSIS — M778 Other enthesopathies, not elsewhere classified: Secondary | ICD-10-CM

## 2019-09-05 DIAGNOSIS — M722 Plantar fascial fibromatosis: Secondary | ICD-10-CM | POA: Diagnosis not present

## 2019-09-05 MED ORDER — IBUPROFEN 800 MG PO TABS: 800.0000 mg | ORAL_TABLET | Freq: Three times a day (TID) | ORAL | 1 refills | Status: DC | PRN

## 2019-09-06 ENCOUNTER — Telehealth: Payer: Self-pay

## 2019-09-06 NOTE — Telephone Encounter (Signed)
Patient made aware of echo results with verbalized understanding. 

## 2019-09-06 NOTE — Telephone Encounter (Signed)
-----   Message from Kate Sable, MD sent at 09/04/2019  4:49 PM EDT ----- Normal systolic and diastolic function.  Normal echocardiogram.

## 2019-09-06 NOTE — Telephone Encounter (Signed)
Called to give the patient echo results. [email or web address]. 

## 2019-09-07 NOTE — Progress Notes (Signed)
   HPI: 34 y.o. female presenting today for follow up evaluation of a fracture of the base of the 2nd metatarsal of the right foot. She reports some improvement. She has discontinued using the post op shoe a few days ago. She reports some continued dull aching pain that is controlled by taking Ibuprofen. She denies any current worsening factors. Patient is here for further evaluation and treatment.   Past Medical History:  Diagnosis Date  . Cigarette smoker   . COVID-19 virus detected 03/21/2019  . IUD contraception 02/06/2016   Placed October 5, 0000000 by Ardeth Perfect, [place]  . Major depressive disorder, recurrent ([place]) 08/19/2015   zoloft made her cry; cymbalta made her too sleepy  . Migraine headache      Physical Exam: General: The patient is alert and oriented x3 in no acute distress.  Dermatology: Skin is warm, dry and supple bilateral lower extremities. Negative for open lesions or macerations.  Vascular: Palpable pedal pulses bilaterally. No edema or erythema noted. Capillary refill within normal limits.  Neurological: Epicritic and protective threshold grossly intact bilaterally.   Musculoskeletal Exam: Pain with palpation noted to the right 2nd metatarsal. Pain with palpation noted to the right heel along the plantar fascia. Range of motion within normal limits to all pedal and ankle joints bilateral. Muscle strength 5/5 in all groups bilateral.   Assessment: 1. Fracture of the 2nd metatarsal base, closed, nondisplaced right - improved  2. Plantar fasciitis right   Plan of Care:  1. Patient evaluated.  2. Injection of 0.5 mLs Celestone Soluspan injected into the right heel.  3. Prescription for Motrin 800 mg provided to patient.  4. Recommended good shoe gear.  5. Return to clinic as needed.   Works 12 hour shifts at [employer].     Edrick Kins, DPM [facility]  Dr. Edrick Kins, DPM    [address], [place] [address]                Office [phone]  Fax [phone]

## 2019-09-11 ENCOUNTER — Ambulatory Visit (INDEPENDENT_AMBULATORY_CARE_PROVIDER_SITE_OTHER): Admitting: Cardiology

## 2019-09-11 ENCOUNTER — Encounter: Payer: Self-pay | Admitting: Cardiology

## 2019-09-11 ENCOUNTER — Other Ambulatory Visit: Payer: Self-pay

## 2019-09-11 ENCOUNTER — Telehealth: Payer: Self-pay | Admitting: *Deleted

## 2019-09-11 VITALS — BP 108/70 | HR 94 | Ht 63.0 in | Wt 241.0 lb

## 2019-09-11 DIAGNOSIS — F17200 Nicotine dependence, unspecified, uncomplicated: Secondary | ICD-10-CM

## 2019-09-11 DIAGNOSIS — R0609 Other forms of dyspnea: Secondary | ICD-10-CM

## 2019-09-11 DIAGNOSIS — R0600 Dyspnea, unspecified: Secondary | ICD-10-CM

## 2019-09-11 DIAGNOSIS — M7989 Other specified soft tissue disorders: Secondary | ICD-10-CM | POA: Diagnosis not present

## 2019-09-11 NOTE — Patient Instructions (Signed)
Medication Instructions:  No Changes *If you need a refill on your cardiac medications before your next appointment, please call your pharmacy*   Lab Work: None Ordered. If you have labs (blood work) drawn today and your tests are completely normal, you will receive your results only by: Marland Kitchen MyChart Message (if you have MyChart) OR . A paper copy in the mail If you have any lab test that is abnormal or we need to change your treatment, we will call you to review the results.   Testing/Procedures: None Ordered   Follow-Up: At [facility], you and your health needs are our priority.  As part of our continuing mission to provide you with exceptional heart care, we have created designated Provider Care Teams.  These Care Teams include your primary Cardiologist (physician) and Advanced Practice Providers (APPs -  Physician Assistants and Nurse Practitioners) who all work together to provide you with the care you need, when you need it.  We recommend signing up for the patient portal called "MyChart".  Sign up information is provided on this After Visit Summary.  MyChart is used to connect with patients for Virtual Visits (Telemedicine).  Patients are able to view lab/test results, encounter notes, upcoming appointments, etc.  Non-urgent messages can be sent to your provider as well.   To learn more about what you can do with MyChart, go to [email or web address].    Your next appointment:   12 month(s)  The format for your next appointment:   In Person  Provider:   Kate Sable, MD   Other Instructions N/A

## 2019-09-11 NOTE — Progress Notes (Signed)
Cardiology Office Note:    Date:  09/11/2019   ID:  Olivia King, DOB 09-Apr-1986, MRN [id number]  PCP:  Olivia Sizer, MD  Cardiologist:  Olivia Sable, MD  Electrophysiologist:  None   Referring MD: Olivia Sizer, MD   Chief Complaint  Patient presents with  . Other    Follow up post ECHO. Meds reviewed verbally with patient.     History of Present Illness:    Olivia King is a 34 y.o. female with a hx of depression, current smoker x 22yrs who presents for follow-up.  She was last seen due to dyspnea on exertion and lower extremity edema.  Echocardiogram was ordered, Lasix was switched to torsemide.  Patient also endorses daytime somnolence and snoring.  Baseline weight is about 220 pounds.  She has states losing weight thinks her last visit.  She has been sleeping much better, feels more energetic, shortness of breath with exertion has also been better since starting a new exercise regimen.  High edema has also improved since taking torsemide.  She now takes it every other day as needed.  She has cut back on her smoking with hopes of quitting in the next several weeks.   Past Medical History:  Diagnosis Date  . Cigarette smoker   . COVID-19 virus detected 03/21/2019  . IUD contraception 02/06/2016   Placed October 5, 0000000 by Olivia King, [place]  . Major depressive disorder, recurrent ([place]) 08/19/2015   zoloft made her cry; cymbalta made her too sleepy  . Migraine headache     Past Surgical History:  Procedure Laterality Date  . CESAREAN SECTION  2004 &2012  . contraceptive implant      Current Medications: Current Meds  Medication Sig  . ARIPiprazole (ABILIFY) 5 MG tablet Take 1 tablet (5 mg total) by mouth daily.  . butalbital-acetaminophen-caffeine (FIORICET) 50-325-40 MG tablet Take 1-2 tablets by mouth every 12 (twelve) hours as needed for headache. No more than 3 in one day.  . cholecalciferol (VITAMIN D) 1000 units tablet Take 1,000  Units by mouth daily.  Marland Kitchen escitalopram (LEXAPRO) 10 MG tablet Take 1 tablet (10 mg total) by mouth daily.  . fluticasone (FLONASE) 50 MCG/ACT nasal spray Place 2 sprays into both nostrils daily.  . hydrOXYzine (ATARAX/VISTARIL) 10 MG tablet Take 1 tablet (10 mg total) by mouth at bedtime as needed.  Marland Kitchen ibuprofen (ADVIL) 800 MG tablet Take 1 tablet (800 mg total) by mouth every 8 (eight) hours as needed.  Marland Kitchen ipratropium (ATROVENT) 0.03 % nasal spray ipratropium bromide 21 mcg (0.03 %) nasal spray  . lisdexamfetamine (VYVANSE) 20 MG capsule Take 1 capsule (20 mg total) by mouth daily.  Marland Kitchen loratadine (CLARITIN) 10 MG tablet Take 1 tablet (10 mg total) by mouth daily. (Patient taking differently: Take 10 mg by mouth daily as needed. )  . meloxicam (MOBIC) 15 MG tablet TAKE 1 TABLET(15 MG) BY MOUTH DAILY. FOLLOW UP WITH MEDICAL DOCTOR BEFORE FURTHER REFILLS  . Multiple Vitamin (MULTI-VITAMINS) TABS Take by mouth.  Marland Kitchen olopatadine (PATANOL) 0.1 % ophthalmic solution Place 1 drop into both eyes 2 (two) times daily.  Marland Kitchen torsemide (DEMADEX) 20 MG tablet Take 2 tablets (40 mg total) by mouth every other day.     Allergies:   Imitrex [sumatriptan], Penicillins, and Trintellix [vortioxetine]   Social History   Socioeconomic History  . Marital status: Widowed    Spouse name: Not on file  . Number of children: 2  . Years of education: Not  on file  . Highest education level: Associate degree: occupational, Hotel manager, or vocational program  Occupational History  . Occupation: CMA    Comment: [employer]  Tobacco Use  . Smoking status: Current Every Day Smoker    Packs/day: 0.75    Years: 9.00    Pack years: 6.75    Types: Cigarettes    Start date: 03/05/2011  . Smokeless tobacco: Never Used  Substance and Sexual Activity  . Alcohol use: Yes    Alcohol/week: 0.0 standard drinks  . Drug use: No  . Sexual activity: Yes    Partners: Male    Birth control/protection: I.U.D.    Comment: Patient has liletta  IUD  Other Topics Concern  . Not on file  Social History Narrative   She is a widow, he died 07/23/2009 while he was deployed.    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 Difficulty of Paying Living Expenses:   Food Insecurity: No Food Insecurity  . Worried About Charity fundraiser in the Last Year: Never true  . Ran Out of Food in the Last Year: Never true  Transportation Needs: No Transportation Needs  . Lack of Transportation (Medical): No  . Lack of Transportation (Non-Medical): No  Physical Activity: Inactive  . Days of Exercise per Week: 0 days  . Minutes of Exercise per Session: 0 min  Stress:   . Feeling of Stress :   Social Connections:   . Frequency of Communication with Friends and Family:   . Frequency of Social Gatherings with Friends and Family:   . Attends Religious Services:   . Active Member of Clubs or Organizations:   . Attends Archivist Meetings:   Marland Kitchen Marital Status:      Family History: The patient's family history includes Aneurysm in her maternal grandmother; Cancer in her mother; Heart attack in her maternal grandfather; Heart failure in her sister; Hypertension in her brother, mother, and paternal grandmother; Pancreatic cancer in her mother.  ROS:   Please see the history of present illness.     All other systems reviewed and are negative.  EKGs/Labs/Other Studies Reviewed:    The following studies were reviewed today:   EKG:  EKG not  ordered today.    Recent Labs: 05/10/2019: ALT 34; TSH 2.17 05/22/2019: Hemoglobin 12.1; Platelets 329 08/25/2019: BUN 14; Creatinine, Ser 1.04; Potassium 4.1; Sodium 137  Recent Lipid Panel    Component Value Date/Time   CHOL 178 04/09/2017 1555   CHOL 176 12/21/2014 1136   TRIG 94 04/09/2017 1555   HDL 49 (L) 04/09/2017 1555   HDL 47 12/21/2014 1136   CHOLHDL 3.6 04/09/2017 1555   VLDL 11 03/19/2016 0947   LDLCALC 110 (H) 04/09/2017  1555    Physical Exam:    VS:  BP 108/70 (BP Location: Left Arm, Patient Position: Sitting, Cuff Size: Normal)   Pulse 94   Ht 5\' 3"  (1.6 m)   Wt 241 lb (109.3 kg)   SpO2 98%   BMI 42.69 kg/m     Wt Readings from Last 3 Encounters:  09/11/19 241 lb (109.3 kg)  08/18/19 250 lb 3.2 oz (113.5 kg)  07/31/19 249 lb 2 oz (113 kg)     GEN:  Well nourished, well developed in no acute distress HEENT: Normal NECK: No JVD; No carotid bruits LYMPHATICS: No lymphadenopathy CARDIAC: RRR, no murmurs, rubs, gallops RESPIRATORY:  Clear to  auscultation without rales, wheezing or rhonchi  ABDOMEN: Soft, non-tender, non-distended MUSCULOSKELETAL:  No edema; No deformity  SKIN: Warm and dry NEUROLOGIC:  Alert and oriented x 3 PSYCHIATRIC:  Normal affect   ASSESSMENT:    1. DOE (dyspnea on exertion)   2. Localized swelling of lower extremity   3. Smoking   4. Morbid obesity ([place])    PLAN:    In order of problems listed above:  1. Patient with dyspnea on exertion.  Risk factor of obesity, current smoker.  Echocardiogram shows normal systolic and diastolic function.  Patient is young and less likely to have CAD.  I believe deconditioning/obesity and also possible OSA due to history of daytime somnolence and snoring, is contributing to her symptoms of dyspnea.  Since patient is losing weight, and feeling better, will defer sleep study for now. 2. Worsening lower extremity edema.  Improved with torsemide.  Take torsemide as needed for edema. 3. History of smoking.  Smoking cessation advised.  Congratulated on cutting back on her smoking. 4. Patient is morbidly obese.  Low calorie diet, weight loss recommended.  She was congratulated on recent weight loss.  Follow-up 1 year.  This note was generated in part or whole with voice recognition software. Voice recognition is usually quite accurate but there are transcription errors that can and very often do occur. I apologize for any typographical  errors that were not detected and corrected.  Medication Adjustments/Labs and Tests Ordered: Current medicines are reviewed at length with the patient today.  Concerns regarding medicines are outlined above.  No orders of the defined types were placed in this encounter.  No orders of the defined types were placed in this encounter.   Patient Instructions  Medication Instructions:  No Changes *If you need a refill on your cardiac medications before your next appointment, please call your pharmacy*   Lab Work: None Ordered. If you have labs (blood work) drawn today and your tests are completely normal, you will receive your results only by: Marland Kitchen MyChart Message (if you have MyChart) OR . A paper copy in the mail If you have any lab test that is abnormal or we need to change your treatment, we will call you to review the results.   Testing/Procedures: None Ordered   Follow-Up: At [facility], you and your health needs are our priority.  As part of our continuing mission to provide you with exceptional heart care, we have created designated Provider Care Teams.  These Care Teams include your primary Cardiologist (physician) and Advanced Practice Providers (APPs -  Physician Assistants and Nurse Practitioners) who all work together to provide you with the care you need, when you need it.  We recommend signing up for the patient portal called "MyChart".  Sign up information is provided on this After Visit Summary.  MyChart is used to connect with patients for Virtual Visits (Telemedicine).  Patients are able to view lab/test results, encounter notes, upcoming appointments, etc.  Non-urgent messages can be sent to your provider as well.   To learn more about what you can do with MyChart, go to [email or web address].    Your next appointment:   12 month(s)  The format for your next appointment:   In Person  Provider:   Kate Sable, MD   Other Instructions N/A      Signed, Olivia Sable, MD  09/11/2019 11:07 AM    Man

## 2019-09-11 NOTE — Telephone Encounter (Signed)
I attempted to call the patient to let her know as requested that her FMLA forms had been completed.  Her voicemail was full so I could not leave a message.  I faxed the forms to [facility].

## 2019-09-15 ENCOUNTER — Ambulatory Visit (INDEPENDENT_AMBULATORY_CARE_PROVIDER_SITE_OTHER): Admitting: Family Medicine

## 2019-09-15 ENCOUNTER — Encounter: Payer: Self-pay | Admitting: Family Medicine

## 2019-09-15 ENCOUNTER — Other Ambulatory Visit: Payer: Self-pay

## 2019-09-15 VITALS — BP 100/74 | HR 93 | Temp 96.9°F | Resp 16 | Ht 63.0 in | Wt 248.0 lb

## 2019-09-15 DIAGNOSIS — R600 Localized edema: Secondary | ICD-10-CM | POA: Diagnosis not present

## 2019-09-15 DIAGNOSIS — F5081 Binge eating disorder: Secondary | ICD-10-CM

## 2019-09-15 MED ORDER — LISDEXAMFETAMINE DIMESYLATE 40 MG PO CAPS: 40.0000 mg | ORAL_CAPSULE | Freq: Every day | ORAL | 0 refills | Status: DC

## 2019-09-15 MED ORDER — POTASSIUM CHLORIDE CRYS ER 20 MEQ PO TBCR: 20.0000 meq | EXTENDED_RELEASE_TABLET | Freq: Every day | ORAL | 0 refills | Status: DC

## 2019-09-15 NOTE — Progress Notes (Signed)
Name: Olivia King   MRN: [id number]    DOB: March 21, 1986   Date:09/15/2019       Progress Note  Subjective  Chief Complaint  Chief Complaint  Patient presents with  . Obesity    Insurance would not cover Vvyanse, she ended up paying around 300.00 for it.  . Loss of smell    Loss sense of smell due to Covid x 6 months. Smell came back for 2 days and then went away two days later.    HPI  Lymphedema: she went to vascular surgeon, and was advised to use compression stocking hoses and because of sob advised to see cardiologist. She saw Cardiologist and was given Demadex to take one to two daily, she states she has not taken medication past couple of days because she was too tired and also can only tolerate one pill every other day secondary to severe cramps. We will add potassium supplementation, try taking one daily with potassium and when off work take one in am and one at lunch , both times with a potassium pill. She was down to 241 lbs a few days ago when she saw cardiologist but up again on her weight, mostly fluid retention   Morbid obesity: she states as a teenager she was in the 160 lbs range, but after her son was born in 2004 her weight stayed between 189-203 lbs , had her daughter in 2012 her weight went up to 270 lbs, but after her birth she was able to go back down to 203 lbs. She states in 2016 she took some otc diet pills and was going to the gym and her weight dropped about 40 lbs in a few months, she was back to 165 lbs. She told me back in April 2021 that  she can binge eat. She is able to avoid eating during the day - because she is so busy, but at night when upset with her children or not feeling well she eats to get comfort She states she feels out of control and is unable to stop eating, about twice a week, she feels very guilty afterwards. She states when she overeats she takes magnesium to help her have a bowel movement. She does not make herself vomit We started her on  Vyvanse 07/2019 she has noticed that her mood is better but also that she is not craving as much food. She has resumed physical activity. Walking 3 mild 3 days a week - takes her about 45 minutes. Weight is about the same but she has not taken her diuretic in the past 2 days and is very swollen. No side effects of medication , heart rate is actually down. She has follow up with psychiatrist in June and she will ask her if they will prescribe her Vyanse or if I will continue it   History of covid: explained that lack of taste and smell can be intermittent and last months after covid    Patient Active Problem List   Diagnosis Date Noted  . Lymphedema 04/10/2019  . Chronic venous insufficiency 04/10/2019  . Leg pain 04/10/2019  . Bilateral carpal tunnel syndrome 11/04/2018  . Morbid obesity ([place]) 06/14/2017  . Anxiety 03/24/2017  . Contraception management 01/06/2016  . Glucosuria 09/17/2015  . Major depressive disorder, recurrent ([place]) 08/19/2015  . Benign neoplasm of skin of nose 07/28/2015  . Low serum vitamin D 12/24/2014  . Migraine with aura 12/21/2014    Past Surgical History:  Procedure Laterality Date  .  CESAREAN SECTION  2004 &2012  . contraceptive implant      Family History  Problem Relation Age of Onset  . Hypertension Mother   . Cancer Mother        pancreatic  . Pancreatic cancer Mother   . Hypertension Brother   . Aneurysm Maternal Grandmother   . Heart attack Maternal Grandfather   . Hypertension Paternal Grandmother   . Heart failure Sister     Social History   Tobacco Use  . Smoking status: Current Every Day Smoker    Packs/day: 0.75    Years: 9.00    Pack years: 6.75    Types: Cigarettes    Start date: 03/05/2011  . Smokeless tobacco: Never Used  Substance Use Topics  . Alcohol use: Yes    Alcohol/week: 0.0 standard drinks     Current Outpatient Medications:  .  ARIPiprazole (ABILIFY) 5 MG tablet, Take 1 tablet (5 mg total) by mouth daily.,  Disp: 90 tablet, Rfl: 0 .  butalbital-acetaminophen-caffeine (FIORICET) 50-325-40 MG tablet, Take 1-2 tablets by mouth every 12 (twelve) hours as needed for headache. No more than 3 in one day., Disp: 10 tablet, Rfl: 0 .  cholecalciferol (VITAMIN D) 1000 units tablet, Take 1,000 Units by mouth daily., Disp: , Rfl:  .  escitalopram (LEXAPRO) 10 MG tablet, Take 1 tablet (10 mg total) by mouth daily., Disp: 90 tablet, Rfl: 0 .  fluticasone (FLONASE) 50 MCG/ACT nasal spray, Place 2 sprays into both nostrils daily., Disp: 16 g, Rfl: 2 .  hydrOXYzine (ATARAX/VISTARIL) 10 MG tablet, Take 1 tablet (10 mg total) by mouth at bedtime as needed., Disp: 90 tablet, Rfl: 0 .  ibuprofen (ADVIL) 800 MG tablet, Take 1 tablet (800 mg total) by mouth every 8 (eight) hours as needed., Disp: 90 tablet, Rfl: 1 .  ipratropium (ATROVENT) 0.03 % nasal spray, ipratropium bromide 21 mcg (0.03 %) nasal spray, Disp: , Rfl:  .  lisdexamfetamine (VYVANSE) 40 MG capsule, Take 1 capsule (40 mg total) by mouth daily., Disp: 30 capsule, Rfl: 0 .  loratadine (CLARITIN) 10 MG tablet, Take 1 tablet (10 mg total) by mouth daily. (Patient taking differently: Take 10 mg by mouth daily as needed. ), Disp: 90 tablet, Rfl: 0 .  meloxicam (MOBIC) 15 MG tablet, TAKE 1 TABLET(15 MG) BY MOUTH DAILY. FOLLOW UP WITH MEDICAL DOCTOR BEFORE FURTHER REFILLS, Disp: 90 tablet, Rfl: 0 .  Multiple Vitamin (MULTI-VITAMINS) TABS, Take by mouth., Disp: , Rfl:  .  olopatadine (PATANOL) 0.1 % ophthalmic solution, Place 1 drop into both eyes 2 (two) times daily., Disp: 5 mL, Rfl: 2 .  torsemide (DEMADEX) 20 MG tablet, Take 2 tablets (40 mg total) by mouth every other day., Disp: 60 tablet, Rfl: 2 .  potassium chloride SA (KLOR-CON) 20 MEQ tablet, Take 1-2 tablets (20-40 mEq total) by mouth daily., Disp: 180 tablet, Rfl: 0  Allergies  Allergen Reactions  . Imitrex [Sumatriptan] Anaphylaxis    Throat and tongue swelling   . Penicillins Rash  . Trintellix  [Vortioxetine]     Tingling hands and blurred vision     I personally reviewed active problem list, medication list, allergies, family history, social history, health maintenance with the patient/caregiver today.   ROS  Ten systems reviewed and is negative except as mentioned in HPI   Objective  Vitals:   09/15/19 0840  BP: 100/74  Pulse: 93  Resp: 16  Temp: (!) 96.9 F (36.1 C)  TempSrc: Temporal  SpO2: 99%  Weight: 248 lb (112.5 kg)  Height: 5\' 3"  (1.6 m)    Body mass index is 43.93 kg/m.  Physical Exam  Constitutional: Patient appears well-developed and well-nourished. Obese  No distress.  HEENT: head atraumatic, normocephalic, pupils equal and reactive to light,neck supple Cardiovascular: Normal rate, regular rhythm and normal heart sounds.  No murmur heard. Positive 3 plus  BLE edema. Pulmonary/Chest: Effort normal and breath sounds normal. No respiratory distress. Abdominal: Soft.  There is no tenderness. Psychiatric: Patient has a normal mood and affect. behavior is normal. Judgment and thought content normal.  Recent Results (from the past 2160 hour(s))  Basic metabolic panel     Status: Abnormal   Collection Time: 08/25/19 12:14 PM  Result Value Ref Range   Sodium 137 135 - 145 mmol/L   Potassium 4.1 3.5 - 5.1 mmol/L   Chloride 102 98 - 111 mmol/L   CO2 26 22 - 32 mmol/L   Glucose, Bld 115 (H) 70 - 99 mg/dL    Comment: Glucose reference range applies only to samples taken after fasting for at least 8 hours.   BUN 14 6 - 20 mg/dL   Creatinine, Ser 1.04 (H) 0.44 - 1.00 mg/dL   Calcium 9.7 8.9 - 10.3 mg/dL   GFR calc non Af Amer >60 >60 mL/min   GFR calc Af Amer >60 >60 mL/min   Anion gap 9 5 - 15    Comment: Performed at [facility], [address] [place], [place] [address]      PHQ2/9: Depression screen [facility] 2/9 09/15/2019 08/18/2019 06/23/2019 05/10/2019 03/29/2019  Decreased Interest 0 0 0 0 0  Down, Depressed, Hopeless 0 1 0 0 0  PHQ - 2  Score 0 1 0 0 0  Altered sleeping 0 0 0 0 0  Tired, decreased energy - 0 0 0 0  Change in appetite 0 0 0 0 1  Feeling bad or failure about yourself  0 3 0 0 0  Trouble concentrating 0 2 1 0 0  Moving slowly or fidgety/restless 0 0 1 0 0  Suicidal thoughts 0 0 0 0 0  PHQ-9 Score 0 6 2 0 1  Difficult doing work/chores - - Not difficult at all Not difficult at all Not difficult at all  Some recent data might be hidden    phq 9 is negative   Fall Risk: Fall Risk  06/23/2019 05/10/2019 02/15/2019 12/16/2018 08/30/2018  Falls in the past year? 0 0 0 0 0  Number falls in past yr: 0 0 0 0 0  Injury with Fall? 0 0 0 0 0     Assessment & Plan  1. Morbid obesity (HCC)  - lisdexamfetamine (VYVANSE) 40 MG capsule; Take 1 capsule (40 mg total) by mouth daily.  Dispense: 30 capsule; Refill: 0  2. Binge eating disorder  - lisdexamfetamine (VYVANSE) 40 MG capsule; Take 1 capsule (40 mg total) by mouth daily.  Dispense: 30 capsule; Refill: 0  3. Bilateral lower extremity edema  - potassium chloride SA (KLOR-CON) 20 MEQ tablet; Take 1-2 tablets (20-40 mEq total) by mouth daily.  Dispense: 180 tablet; Refill: 0

## 2019-10-10 ENCOUNTER — Other Ambulatory Visit: Payer: Self-pay

## 2019-10-10 ENCOUNTER — Encounter: Payer: Self-pay | Admitting: Psychiatry

## 2019-10-10 ENCOUNTER — Telehealth (INDEPENDENT_AMBULATORY_CARE_PROVIDER_SITE_OTHER): Admitting: Psychiatry

## 2019-10-10 DIAGNOSIS — F330 Major depressive disorder, recurrent, mild: Secondary | ICD-10-CM

## 2019-10-10 DIAGNOSIS — F411 Generalized anxiety disorder: Secondary | ICD-10-CM | POA: Diagnosis not present

## 2019-10-10 DIAGNOSIS — F509 Eating disorder, unspecified: Secondary | ICD-10-CM | POA: Diagnosis not present

## 2019-10-10 MED ORDER — LISDEXAMFETAMINE DIMESYLATE 40 MG PO CAPS: 40.0000 mg | ORAL_CAPSULE | ORAL | 0 refills | Status: DC

## 2019-10-10 MED ORDER — LAMOTRIGINE 25 MG PO TABS: 25.0000 mg | ORAL_TABLET | Freq: Every day | ORAL | 1 refills | Status: DC
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
## 2019-10-18 ENCOUNTER — Telehealth: Payer: Self-pay

## 2019-10-18 NOTE — Telephone Encounter (Signed)
Prior authorization needed for vyvanse 40mg 

## 2019-10-18 NOTE — Telephone Encounter (Signed)
submitted prior auth - pending 

## 2019-10-19 ENCOUNTER — Ambulatory Visit (INDEPENDENT_AMBULATORY_CARE_PROVIDER_SITE_OTHER): Admitting: Licensed Clinical Social Worker

## 2019-10-19 ENCOUNTER — Other Ambulatory Visit: Payer: Self-pay

## 2019-10-19 DIAGNOSIS — F411 Generalized anxiety disorder: Secondary | ICD-10-CM | POA: Diagnosis not present

## 2019-10-19 DIAGNOSIS — F330 Major depressive disorder, recurrent, mild: Secondary | ICD-10-CM | POA: Diagnosis not present

## 2019-10-19 DIAGNOSIS — F509 Eating disorder, unspecified: Secondary | ICD-10-CM

## 2019-10-19 NOTE — Progress Notes (Signed)
Virtual Visit via Video Note  I connected with Olivia King on 10/19/19 at  8:00 AM EDT by a video enabled telemedicine application and verified that I am speaking with the correct person using two identifiers.  Location: Patient: home Provider: [facility]   I discussed the limitations of evaluation and management by telemedicine and the availability of in person appointments. The patient expressed understanding and agreed to proceed.   I discussed the assessment and treatment plan with the patient. The patient was provided an opportunity to ask questions and all were answered. The patient agreed with the plan and demonstrated an understanding of the instructions.   The patient was advised to call back or seek an in-person evaluation if the symptoms worsen or if the condition fails to improve as anticipated.  I provided 30 minutes of non-face-to-face time during this encounter.   Olivia King R Olivia Villeda, LCSW    THERAPIST PROGRESS NOTE  Session Time: 30 min  Participation Level: Active  Behavioral Response: NAAlertstable  Type of Therapy: Individual Therapy  Treatment Goals addressed: Anxiety and Coping  Interventions: Psychosocial Skills: stress management; self care  Summary: Olivia King is a 34 y.o. female who presents with stress associated with work and family.    Today's session was pts first counseling session w/ clinician--allowed pt to explore and express thoughts and feelings about current external stressors and discuss coping mechanisms that have worked in the past--and coping mechanisms/psychosocial skills that may need work.  Pt has two children (8yo and 16yo) living at home. Pts sister (expecting a baby any day) is also living in the home with pt. Pt is working full time and taking care of the family--concerned about not placing self as a priority.  Discussed importance of self care and focusing on overall emotional well being, life balance, and stress management.     Suicidal/Homicidal: No  Therapist Response: Anaysia reports that the Vyvanse has helped significantly with her binge eating--pt is progressing well towards goals of managing undesirable behavior. LCSW and pt will continue focusing on managing stress, self care behaviors, and focusing on overall emotional and physical wellness.  Plan: Return again in 3 weeks. LCSW assisted with follow up OPT appt.  Diagnosis: Axis I: Generalized Anxiety Disorder, Major Depression, Recurrent severe and Eating disorder    Axis II: No diagnosis    Olivia Bo [name], LCSW 10/19/2019

## 2019-10-19 NOTE — Patient Instructions (Signed)
Caring for Your Mental Health Mental health is emotional, psychological, and social well-being. Mental health is just as important as physical health. In fact, mental and physical health are connected, and you need both to be healthy. Some signs of good mental health (well-being) include:  Being able to attend to tasks at home, school, or work.  Being able to manage stress and emotions.  Practicing self-care, which may include: ? A regular exercise pattern. ? A reasonably healthy diet. ? Supportive and trusting relationships. ? The ability to relax and calm yourself (self-calm).  Having pleasurable hobbies and activities to do.  Believing that you have meaning and purpose in your life.  Recovering and adjusting after facing challenges (resilience). You can take steps to build or strengthen these mentally healthy behaviors. There are resources and support to help you with this. Why is caring for mental health important? Caring for your mental health is a big part of staying healthy. Everyone has times when feelings, thoughts, or situations feel overwhelming. Mental health means having the skills to manage what feels overwhelming. If this sense of being overwhelmed persists, however, you might need some help. If you have some of the following signs, you may need to take better care of your mental health or seek help from a health care provider or mental health professional:  Problems with energy or focus.  Changes in eating habits.  Problems sleeping, such as sleeping too much or not enough.  Emotional distress, such as anger, sadness, depression, or anxiety.  Major changes in your relationships.  Losing interest in life or activities that you used to enjoy. If you have any of these symptoms on most days for 2 weeks or longer:  Talk with a close friend or family member about how you are feeling.  Contact your health care provider to discuss your symptoms.  Consider working with a  Education officer, community. Your health care provider, family, or friends may be able to recommend a therapist. What can I do to promote emotional and mental health? Managing emotions  Learn to identify emotions and deal with them. Recognizing your emotions is the first step in learning to deal with them.  Practice ways to appropriately express feelings. Remember that you can control your feelings. They do not control you.  Practice stress management techniques, such as: ? Relaxation techniques, like breathing or muscle relaxation exercises. ? Exercise. Regular activity can lower your stress level. ? Changing what you can change and accepting what you cannot change.  Build up your resilience so that you can recover and adjust after big problems or challenges. Practice resilient behaviors and attitudes: ? Set and focus on long-term goals. ? Develop and maintain healthy, supportive relationships. ? Learn to accept change and make the best of the situation. ? Take care of yourself physically by eating a healthy diet, getting plenty of sleep, and exercising regularly. ? Develop self-awareness. Ask others to give feedback about how they see you. ? Practice mindfulness meditation to help you stay calm when dealing with daily challenges. ? Learn to respond to situations in healthy ways, rather than reacting with your emotions. ? Keep a positive attitude, and believe in yourself. Your view of yourself affects your mental health. ? Develop your listening and empathy skills. These will help you deal with difficult situations and communications.  Remember that emotions can be used as a good source of communication and are a great source of energy. Try to laugh and find humor in life.  Sleeping  Get the right amount and quality of sleep. Sleep has a big impact on physical and mental health. To improve your sleep: ? Go to bed and wake up around the same time every day. ? Limit screen time before  bedtime. This includes the use of your cell phone, TV, computer, and tablet. ? Keep your bedroom dark and cool. Activity   Exercise or do some physical activity regularly. This helps: ? Keep your body strong, especially during times of stress. ? Get rid of chemicals in your body (hormones) that build up when you are stressed. ? Build up your resilience. Eating and drinking   Eat a healthy diet that includes whole grains, vegetables, fresh fruits, and lean proteins. If you have questions about what foods are best for you, ask your health care provider.  Try not to turn to sweet, salty, or otherwise unhealthy foods when you are tired or unhappy. This can lead to unwanted weight gain and is not a healthy way to cope with emotions. Where to find more information You can find more information about how to care for your mental health from:  National Alliance on Mental Illness (NAMI): www.nami.org  National Institute of Mental Health: www.nimh.nih.gov  Centers for Disease Control and Prevention: www.cdc.gov/hrqol/wellbeing.htm Contact a health care provider if:  You lose interest in being with others or you do not want to leave the house.  You have a hard time completing your normal activities or you have less energy than normal.  You cannot stay focused or you have problems with memory.  You feel that your senses are heightened, and this makes you upset or concerned.  You feel nervous or have rapid mood changes.  You are sleeping or eating more or less than normal.  You question reality or you show odd behavior that disturbs you or others. Get help right away if:  You have thoughts about hurting yourself or others. If you ever feel like you may hurt yourself or others, or have thoughts about taking your own life, get help right away. You can go to your nearest emergency department or call:  Your local emergency services (911 in the [place]  A suicide crisis helpline, such as the  National Suicide Prevention Lifeline at 1-800-273-8255. This is open 24 hours a day. Summary  Mental health is not just the absence of mental illness. It involves understanding your emotions and behaviors, and taking steps to cope with them in a healthy way.  If you have symptoms of mental or emotional distress, get help from family, friends, a health care provider, or a mental health professional.  Practice good mental health behaviors such as stress management skills, self-calming skills, exercise, and healthy sleeping and eating. This information is not intended to replace advice given to you by your health care provider. Make sure you discuss any questions you have with your health care provider. Document Revised: 03/19/2017 Document Reviewed: 08/18/2016 Elsevier Patient Education  2020 [employer]  Managing Stress, Adult Feeling a certain amount of stress is normal. Stress helps our body and mind get ready to deal with the demands of life. Stress hormones can motivate you to do well at work and meet your responsibilities. However severe or long-lasting (chronic) stress can affect your mental and physical health. Chronic stress puts you at higher risk for anxiety, depression, and other health problems like digestive problems, muscle aches, heart disease, high blood pressure, and stroke. What are the causes? Common causes of stress include:    Demands from work, such as deadlines, feeling overworked, or having long hours.  Pressures at home, such as money issues, disagreements with a spouse, or parenting issues.  Pressures from major life changes, such as divorce, moving, loss of a loved one, or chronic illness. You may be at higher risk for stress-related problems if you do not get enough sleep, are in poor health, do not have emotional support, or have a mental health disorder like anxiety or depression. How to recognize stress Stress can make you:  Have trouble sleeping.  Feel sad,  anxious, irritable, or overwhelmed.  Lose your appetite.  Overeat or want to eat unhealthy foods.  Want to use drugs or alcohol. Stress can also cause physical symptoms, such as:  Sore, tense muscles, especially in the shoulders and neck.  Headaches.  Trouble breathing.  A faster heart rate.  Stomach pain, nausea, or vomiting.  Diarrhea or constipation.  Trouble concentrating. Follow these instructions at home: Lifestyle  Identify the source of your stress and your reaction to it. See a therapist who can help you change your reactions.  When there are stressful events: ? Talk about it with family, friends, or co-workers. ? Try to think realistically about stressful events and not ignore them or overreact. ? Try to find the positives in a stressful situation and not focus on the negatives. ? Cut back on responsibilities at work and home, if possible. Ask for help from friends or family members if you need it.  Find ways to cope with stress, such as: ? Meditation. ? Deep breathing. ? Yoga or tai chi. ? Progressive muscle relaxation. ? Doing art, playing music, or reading. ? Making time for fun activities. ? Spending time with family and friends.  Get support from family, friends, or spiritual resources. Eating and drinking  Eat a healthy diet. This includes: ? Eating foods that are high in fiber, such as beans, whole grains, and fresh fruits and vegetables. ? Limiting foods that are high in fat and processed sugars, such as fried and sweet foods.  Do not skip meals or overeat.  Drink enough fluid to keep your urine pale yellow. Alcohol use  Do not drink alcohol if: ? Your health care provider tells you not to drink. ? You are pregnant, may be pregnant, or are planning to become pregnant.  Drinking alcohol is a way some people try to ease their stress. This can be dangerous, so if you drink alcohol: ? Limit how much you use to:  0-1 drink a day for  women.  0-2 drinks a day for men. ? Be aware of how much alcohol is in your drink. In the [place] one drink equals one 12 oz bottle of beer (355 mL), one 5 oz glass of wine (148 mL), or one 1 oz glass of hard liquor (44 mL). Activity   Include 30 minutes of exercise in your daily schedule. Exercise is a good stress reducer.  Include time in your day for an activity that you find relaxing. Try taking a walk, going on a bike ride, reading a book, or listening to music.  Schedule your time in a way that lowers stress, and keep a consistent schedule. Prioritize what is most important to get done. General instructions  Get enough sleep. Try to go to sleep and get up at about the same time every day.  Take over-the-counter and prescription medicines only as told by your health care provider.  Do not use any products that   contain nicotine or tobacco, such as cigarettes, e-cigarettes, and chewing tobacco. If you need help quitting, ask your health care provider.  Do not use drugs or smoke to cope with stress.  Keep all follow-up visits as told by your health care provider. This is important. Where to find support  Talk with your health care provider about stress management or finding a support group.  Find a therapist to work with you on your stress management techniques. Contact a health care provider if:  Your stress symptoms get worse.  You are unable to manage your stress at home.  You are struggling to stop using drugs or alcohol. Get help right away if:  You may be a danger to yourself or others.  You have any thoughts of death or suicide. If you ever feel like you may hurt yourself or others, or have thoughts about taking your own life, get help right away. You can go to your nearest emergency department or call:  Your local emergency services (911 in the [place]  A suicide crisis helpline, such as the [facility] at (214)756-5951. This is open 24  hours a day. Summary  Feeling a certain amount of stress is normal, but severe or long-lasting (chronic) stress can affect your mental and physical health.  Chronic stress can put you at higher risk for anxiety, depression, and other health problems like digestive problems, muscle aches, heart disease, high blood pressure, and stroke.  You may be at higher risk for stress-related problems if you do not get enough sleep, are in poor health, lack emotional support, or have a mental health disorder like anxiety or depression.  Identify the source of your stress and your reaction to it. Try talking about stressful events with family, friends, or co-workers, finding a coping method, or getting support from spiritual resources.  If you need more help, talk with your health care provider about finding a support group or a mental health therapist. This information is not intended to replace advice given to you by your health care provider. Make sure you discuss any questions you have with your health care provider. Document Revised: 11/02/2018 Document Reviewed: 11/02/2018 Elsevier Patient Education  [place].

## 2019-10-19 NOTE — Telephone Encounter (Signed)
prior Josem Kaufmann was denied.

## 2019-10-26 NOTE — Telephone Encounter (Signed)
vyvanse was denied.

## 2019-10-26 NOTE — Telephone Encounter (Signed)
received a form to fill out for prior authorization.  form was completed and faxed and comfirmed back to [facility] prior auth - pending review

## 2019-10-27 NOTE — Telephone Encounter (Signed)
prior auth for vyvanse was  denied .

## 2019-11-15 ENCOUNTER — Telehealth (INDEPENDENT_AMBULATORY_CARE_PROVIDER_SITE_OTHER): Admitting: Psychiatry

## 2019-11-15 ENCOUNTER — Encounter: Payer: Self-pay | Admitting: Psychiatry

## 2019-11-15 ENCOUNTER — Other Ambulatory Visit: Payer: Self-pay

## 2019-11-15 DIAGNOSIS — F330 Major depressive disorder, recurrent, mild: Secondary | ICD-10-CM

## 2019-11-15 DIAGNOSIS — F509 Eating disorder, unspecified: Secondary | ICD-10-CM

## 2019-11-15 DIAGNOSIS — F411 Generalized anxiety disorder: Secondary | ICD-10-CM

## 2019-11-15 MED ORDER — LAMOTRIGINE 25 MG PO TABS: 25.0000 mg | ORAL_TABLET | Freq: Every day | ORAL | 0 refills | Status: DC

## 2019-11-15 NOTE — Progress Notes (Signed)
Provider Location : [facility] Patient Location : Home  Virtual Visit via Video Note  I connected with Olivia King on 11/15/19 at  4:00 PM EDT by a video enabled telemedicine application and verified that I am speaking with the correct person using two identifiers.   I discussed the limitations of evaluation and management by telemedicine and the availability of in person appointments. The patient expressed understanding and agreed to proceed.    I discussed the assessment and treatment plan with the patient. The patient was provided an opportunity to ask questions and all were answered. The patient agreed with the plan and demonstrated an understanding of the instructions.   The patient was advised to call back or seek an in-person evaluation if the symptoms worsen or if the condition fails to improve as anticipated.   [facility] MD OP Progress Note  11/15/2019 6:34 PM Somara Frymire  MRN:  [id number]  Chief Complaint:  Chief Complaint    Follow-up     HPI: Olivia King is a 34 year old female, employed, lives in [place], widowed, has a history of MDD, GAD, anxiety, lipidemia, eating disorder unspecified probably binge eating disorder was evaluated by telemedicine today.  Patient today reports she has noticed improvement in her depressive symptoms.  She reports her fatigue and lack of motivation as improving.  She also reports her anxiety symptoms as improving.  She is not as restless as she used to be before.  She reports the medications as beneficial.  She however reports she had to stop taking the Vyvanse since her health insurance plan did not approve it.  She reports after a few days of stopping it she became very anxious and agitated and felt like she was going through a crash.  She reports she hence been back on phentermine which she had from a previous prescription.  She reports that it helped her to some extent.  She reports she took her last dosage of phentermine yesterday.   She reports today she feels good even without the phenterimine.  She has upcoming appointment with her provider who prescribed her the phentermine to see if she can be restarted on it since she continues to be interested in weight loss.  Patient reports she had one session with her therapist.  She is motivated to follow-up with her therapist on a regular basis for her eating disorder.  Patient reports sleep is fair.  Patient denies any suicidality, homicidality or perceptual disturbances.  Patient continues to smoke cigarettes and is trying to cut back.  She is not interested in any medications for the same.  Patient denies any other concerns today.  Visit Diagnosis:    ICD-10-CM   1. MDD (major depressive disorder), recurrent episode, mild (HCC)  F33.0 lamoTRIgine (LAMICTAL) 25 MG tablet  2. GAD (generalized anxiety disorder)  F41.1 lamoTRIgine (LAMICTAL) 25 MG tablet  3. Eating disorder, unspecified type  F50.9     Past Psychiatric History: I have reviewed past psychiatric history from my progress note on 10/10/2019.  Past trials of medications like Lexapro, Vyvanse, Abilify.  Past Medical History:  Past Medical History:  Diagnosis Date  . Cigarette smoker   . COVID-19 virus detected 03/21/2019  . IUD contraception 02/06/2016   Placed January 23, 8468 by Ardeth Perfect, [place]  . Major depressive disorder, recurrent ([place]) 08/19/2015   zoloft made her cry; cymbalta made her too sleepy  . Migraine headache     Past Surgical History:  Procedure Laterality Date  . CESAREAN  SECTION  Aug 05, 2002 &2012  . contraceptive implant      Family Psychiatric History: I have reviewed family psychiatric history from my progress note on 10/10/2019  Family History:  Family History  Problem Relation Age of Onset  . Hypertension Mother   . Cancer Mother        pancreatic  . Pancreatic cancer Mother   . Hypertension Brother   . Aneurysm Maternal Grandmother   . Heart attack Maternal  Grandfather   . Hypertension Paternal Grandmother   . Heart failure Sister     Social History: Reviewed social history from my progress note on 10/10/2019 Social History   Socioeconomic History  . Marital status: Widowed    Spouse name: Not on file  . Number of children: 2  . Years of education: Not on file  . Highest education level: Associate degree: occupational, Hotel manager, or vocational program  Occupational History  . Occupation: CMA    Comment: [employer]  Tobacco Use  . Smoking status: Current Every Day Smoker    Packs/day: 0.75    Years: 9.00    Pack years: 6.75    Types: Cigarettes    Start date: 03/05/2011  . Smokeless tobacco: Never Used  Vaping Use  . Vaping Use: Never used  Substance and Sexual Activity  . Alcohol use: Yes    Alcohol/week: 0.0 standard drinks  . Drug use: No  . Sexual activity: Yes    Partners: Male    Birth control/protection: I.U.D.    Comment: Patient has liletta IUD  Other Topics Concern  . Not on file  Social History Narrative   She is a widow, he died Aug 04, 2009 while he was deployed.    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 5885   Social Determinants of Health   Financial Resource Strain: Low Risk   . Difficulty of Paying Living Expenses: Not hard at all  Food Insecurity: No Food Insecurity  . Worried About Charity fundraiser in the Last Year: Never true  . Ran Out of Food in the Last Year: Never true  Transportation Needs: No Transportation Needs  . Lack of Transportation (Medical): No  . Lack of Transportation (Non-Medical): No  Physical Activity: Sufficiently Active  . Days of Exercise per Week: 3 days  . Minutes of Exercise per Session: 50 min  Stress: No Stress Concern Present  . Feeling of Stress : Not at all  Social Connections: Moderately Integrated  . Frequency of Communication with Friends and Family: More than three times a week  . Frequency of Social Gatherings with Friends and Family: More than three times a week  .  Attends Religious Services: More than 4 times per year  . Active Member of Clubs or Organizations: Yes  . Attends Archivist Meetings: More than 4 times per year  . Marital Status: Widowed    Allergies:  Allergies  Allergen Reactions  . Imitrex [Sumatriptan] Anaphylaxis    Throat and tongue swelling   . Penicillins Rash  . Trintellix [Vortioxetine]     Tingling hands and blurred vision     Metabolic Disorder Labs: Lab Results  Component Value Date   HGBA1C 5.1 05/10/2019   MPG 100 05/10/2019   MPG 103 03/04/2018   No results found for: PROLACTIN Lab Results  Component Value Date   CHOL 178 04/09/2017   TRIG 94 04/09/2017   HDL 49 (L) 04/09/2017   CHOLHDL 3.6 04/09/2017   VLDL 11 03/19/2016   LDLCALC 110 (  H) 04/09/2017   LDLCALC 98 03/19/2016   Lab Results  Component Value Date   TSH 2.17 05/10/2019   TSH 3.10 04/09/2017    Therapeutic Level Labs: No results found for: LITHIUM No results found for: VALPROATE No components found for:  CBMZ  Current Medications: Current Outpatient Medications  Medication Sig Dispense Refill  . butalbital-acetaminophen-caffeine (FIORICET) 50-325-40 MG tablet Take 1-2 tablets by mouth every 12 (twelve) hours as needed for headache. No more than 3 in one day. 10 tablet 0  . cholecalciferol (VITAMIN D) 1000 units tablet Take 1,000 Units by mouth daily.    Marland Kitchen escitalopram (LEXAPRO) 10 MG tablet Take 1 tablet (10 mg total) by mouth daily. 90 tablet 0  . fluticasone (FLONASE) 50 MCG/ACT nasal spray Place 2 sprays into both nostrils daily. 16 g 2  . hydrOXYzine (ATARAX/VISTARIL) 10 MG tablet Take 1 tablet (10 mg total) by mouth at bedtime as needed. 90 tablet 0  . ibuprofen (ADVIL) 800 MG tablet Take 1 tablet (800 mg total) by mouth every 8 (eight) hours as needed. 90 tablet 1  . ipratropium (ATROVENT) 0.03 % nasal spray ipratropium bromide 21 mcg (0.03 %) nasal spray    . lamoTRIgine (LAMICTAL) 25 MG tablet Take 1 tablet (25 mg  total) by mouth daily. 90 tablet 0  . loratadine (CLARITIN) 10 MG tablet Take 1 tablet (10 mg total) by mouth daily. (Patient taking differently: Take 10 mg by mouth daily as needed. ) 90 tablet 0  . meloxicam (MOBIC) 15 MG tablet TAKE 1 TABLET(15 MG) BY MOUTH DAILY. FOLLOW UP WITH MEDICAL DOCTOR BEFORE FURTHER REFILLS 90 tablet 0  . metroNIDAZOLE (FLAGYL) 500 MG tablet Take 500 mg by mouth 2 (two) times daily.    . Multiple Vitamin (MULTI-VITAMINS) TABS Take by mouth.    Marland Kitchen olopatadine (PATANOL) 0.1 % ophthalmic solution Place 1 drop into both eyes 2 (two) times daily. 5 mL 2  . potassium chloride SA (KLOR-CON) 20 MEQ tablet Take 1-2 tablets (20-40 mEq total) by mouth daily. 180 tablet 0  . torsemide (DEMADEX) 20 MG tablet Take 2 tablets (40 mg total) by mouth every other day. 60 tablet 2   No current facility-administered medications for this visit.     Musculoskeletal: Strength & Muscle Tone: UTA Gait & Station: normal Patient leans: N/A  Psychiatric Specialty Exam: Review of Systems  Psychiatric/Behavioral: The patient is nervous/anxious.   All other systems reviewed and are negative.   There were no vitals taken for this visit.There is no height or weight on file to calculate BMI.  General Appearance: Casual  Eye Contact:  Fair  Speech:  Clear and Coherent  Volume:  Normal  Mood:  Anxious improving  Affect:  Congruent  Thought Process:  Goal Directed and Descriptions of Associations: Intact  Orientation:  Full (Time, Place, and Person)  Thought Content: Logical   Suicidal Thoughts:  No  Homicidal Thoughts:  No  Memory:  Immediate;   Fair Recent;   Fair Remote;   Fair  Judgement:  Fair  Insight:  Fair  Psychomotor Activity:  Normal  Concentration:  Concentration: Fair and Attention Span: Fair  Recall:  [employer] of Knowledge: Fair  Language: Fair  Akathisia:  No  Handed:  Right  AIMS (if indicated): UTA  Assets:  Communication Skills Desire for  Improvement Housing Social Support  ADL's:  Intact  Cognition: WNL  Sleep:  Fair   Screenings: GAD-7     Video Visit from 10/10/2019  in [place] Visit from 06/23/2019 in [facility] Office Visit from 03/29/2019 in [facility] Office Visit from 08/30/2018 in [facility] Office Visit from 07/06/2018 in [facility]  Total GAD-7 Score 10 3 5 1 4     PHQ2-9     Counselor from 10/19/2019 in [facility] Office Visit from 09[facility]15/2019 in [facility] Office Visit from 08/18/2019 in [facility] Office Visit from 06/23/2019 in [facility] Office Visit from 05/10/2019 in [facility]  PHQ-2 Total Score 1 0 1 0 0  PHQ-9 Total Score 4 0 6 2 0       Assessment and Plan: Olivia King is a 34 year old female, widowed, lives in [place], employed, has a history of depression, anxiety, I eating disorder, lymphedema, obesity was evaluated by telemedicine today.  Patient is biologically predisposed given her history of trauma, medical problems.  Patient with psychosocial stressors of the death of her husband while in [employer], her own history of trauma, pandemic, job related stressors.  Patient is currently making progress with regards to her depressive symptoms.  She reports her eating disorder symptoms as improved however she is interested in continuing psychotherapy sessions.  Plan as noted below.  Plan MDD-improving Lexapro 10 mg p.o. daily Lamictal 25 mg p.o. daily  GAD-improving Lexapro 10 mg p.o. daily Hydroxyzine as needed.  Eating disorder unspecified-improving Probably binge eating disorder. Continue CBT with therapist-Ms.Hussami Discontinue Vyvanse since she cannot afford it.  Tobacco use disorder-unstable Provided counseling. Discussed medication options like Chantix,  nicotine gums or patches.  She reports she will Biochemist, clinical know.  Follow-up in clinic in 4 to 5 weeks or sooner if needed.  I have spent atleast 20 minutes non face to face with patient today. More than 50 % of the time was spent for preparing to see the patient ( e.g., review of test, records ), ordering medications and test ,psychoeducation and supportive psychotherapy and care coordination,as well as documenting clinical information in electronic health record. This note was generated in part or whole with voice recognition software. Voice recognition is usually quite accurate but there are transcription errors that can and very often do occur. I apologize for any typographical errors that were not detected and corrected.        Ursula Alert, MD 11/15/2019, 6:34 PM

## 2019-11-17 ENCOUNTER — Ambulatory Visit: Admitting: Licensed Clinical Social Worker

## 2019-11-17 ENCOUNTER — Telehealth: Payer: Self-pay | Admitting: Licensed Clinical Social Worker

## 2019-11-17 ENCOUNTER — Other Ambulatory Visit: Payer: Self-pay

## 2019-11-17 NOTE — Telephone Encounter (Addendum)
LCSW counselor tried to connect with patient via MyChart video text request x 2, and tried to connect via phone without success.  Could not leave message on voicemail because mailbox was full.

## 2019-11-24 ENCOUNTER — Other Ambulatory Visit: Payer: Self-pay

## 2019-11-24 ENCOUNTER — Other Ambulatory Visit: Payer: Self-pay | Admitting: Family Medicine

## 2019-11-24 ENCOUNTER — Ambulatory Visit (INDEPENDENT_AMBULATORY_CARE_PROVIDER_SITE_OTHER): Admitting: Licensed Clinical Social Worker

## 2019-11-24 DIAGNOSIS — F411 Generalized anxiety disorder: Secondary | ICD-10-CM | POA: Diagnosis not present

## 2019-11-24 DIAGNOSIS — F330 Major depressive disorder, recurrent, mild: Secondary | ICD-10-CM

## 2019-11-24 DIAGNOSIS — R600 Localized edema: Secondary | ICD-10-CM

## 2019-11-24 DIAGNOSIS — F509 Eating disorder, unspecified: Secondary | ICD-10-CM | POA: Diagnosis not present

## 2019-11-24 NOTE — Progress Notes (Signed)
Virtual Visit via Video Note  I connected with Olivia King on 11/24/19 at  8:00 AM EDT by a video enabled telemedicine application and verified that I am speaking with the correct person using two identifiers.  Location: Patient: home Provider: remote office [place], [place])   I discussed the limitations of evaluation and management by telemedicine and the availability of in person appointments. The patient expressed understanding and agreed to proceed.   The patient was advised to call back or seek an in-person evaluation if the symptoms worsen or if the condition fails to improve as anticipated.  I provided 35 minutes of non-face-to-face time during this encounter.   Corah Willeford R Naziah Portee, LCSW    THERAPIST PROGRESS NOTE  Session Time: 8:00-8:35 am  Participation Level: Active  Behavioral Response: Neat and Well GroomedAlertDepressed  Type of Therapy: Individual Therapy  Treatment Goals addressed: Anxiety and Coping  Interventions: Supportive  Summary: Olivia King is a 34 y.o. female who presents with stable mood and is managing overall stress and anxiety better.  Pt reports that she took Vyvanse but had to discontinue due to insurance coverage issues and cost. Pt reports that she felt very fatigued and sluggish coming off of the medication, so pt is hesitant to take meds again.  Pt feels she is managing binge eating behaviorally, with success. Reviewed healthy eating patterns with pt.  Pt has event coming up that was significant trigger re: husband's death. Allowed pt to explore and express thoughts and feelings about husband's death and recent trigger. Pt wants to drive to the beach to connect with self instead of the highly-publicized memorial service that is typically televised every year. Pt does not want to be a part of that this year. Encouraged pt to make whatever choice brings her inner peace and reminded pt that this was her personal grieving journey and others  don't have to dictate activities for her.   Suicidal/Homicidal: No  Therapist Response: Amee reports compliance with medication and that mood has been stable. Pt reports an increase in anxiety symptoms due to upcoming external stressor/trauma trigger.  This is indicative of fluctuating/intermittent progress. LCSW will continue focusing on trauma processing.   Plan: Return again in 3 weeks. Ongoing treatment plan to include overall mood management, anxiety management, trauma processing, and focus on body positivity and self care.  Diagnosis: Axis I: Generalized Anxiety Disorder, Major Depression, Recurrent severe and eating disorder    Axis II: No diagnosis    Rachel Bo Isiaha [place], LCSW 11/24/2019

## 2019-11-28 ENCOUNTER — Encounter: Payer: Self-pay | Admitting: Family Medicine

## 2019-12-14 ENCOUNTER — Ambulatory Visit (INDEPENDENT_AMBULATORY_CARE_PROVIDER_SITE_OTHER): Admitting: Licensed Clinical Social Worker

## 2019-12-14 ENCOUNTER — Other Ambulatory Visit: Payer: Self-pay

## 2019-12-14 DIAGNOSIS — F509 Eating disorder, unspecified: Secondary | ICD-10-CM | POA: Diagnosis not present

## 2019-12-14 DIAGNOSIS — F411 Generalized anxiety disorder: Secondary | ICD-10-CM

## 2019-12-14 DIAGNOSIS — F330 Major depressive disorder, recurrent, mild: Secondary | ICD-10-CM | POA: Diagnosis not present

## 2019-12-14 NOTE — Progress Notes (Signed)
Virtual Visit via Video Note  I connected with Olivia King on 12/14/19 at  1:30 PM EDT by a video enabled telemedicine application and verified that I am speaking with the correct person using two identifiers. Switched session to telephone after having audio issues.  Location: Patient: home Provider: [facility]   I discussed the limitations of evaluation and management by telemedicine and the availability of in person appointments. The patient expressed understanding and agreed to proceed.   The patient was advised to call back or seek an in-person evaluation if the symptoms worsen or if the condition fails to improve as anticipated.  I provided 30 minutes of non-face-to-face time during this encounter.   Basir Niven R Grayton Lobo, LCSW    THERAPIST PROGRESS NOTE  Session Time: 1:30-2:03 pm  Participation Level: Active  Behavioral Response: NAAlertDepressed  Type of Therapy: Individual Therapy  Treatment Goals addressed: Coping  Interventions: CBT and Supportive  Summary: [name] is a 34 y.o. female who presents with continuing symptoms related to her diagnoses (depression, anxiety, binge eating).  Pt reports that she decided to stop vyvanse and feels that this could have triggered depressive episode since last counseling session. Pt reports that during the depressive episode, pt did not get out of bed, shower, or leave the house. Pt did not want to engage with anyone, including her own children. Pt reports that family members stepped in and helped with childcare during that time.  Pt reports that episode ended when she attended a parent meeting at her son's school for sports--feels that getting out and engaging with others was helpful. Total duration of episode was 3 days. Pt reports that mood is not as stable as before the episode.  Reviewed CBT strategies to help pt be more active throughout the day and discussed cognitively stimulating activities.   Pt reports that she is  willing to try when her energy level is high enough.  Suicidal/Homicidal: No  Therapist Response: Erinne reports an increase in depression symptoms since last session. Pt feels the relapse could have been triggered by recent discontinuation of vyvanse. This is indicative of fluctuating/intermittent progress.   Plan: Return again in 2 weeks. Ongoing treatment plan is symptom relapse prevention, increase in self care behaviors, and improve work/life balance.  Will inform psychiatrist of depressive episode. Next appt w/ psych 01/03/20.   Diagnosis: Axis I: Generalized Anxiety Disorder and Major Depression, Recurrent severe    Axis II: No diagnosis    Rachel Bo Neely Kammerer, LCSW 12/14/2019

## 2019-12-18 ENCOUNTER — Ambulatory Visit (INDEPENDENT_AMBULATORY_CARE_PROVIDER_SITE_OTHER): Admitting: Family Medicine

## 2019-12-18 ENCOUNTER — Encounter: Payer: Self-pay | Admitting: Family Medicine

## 2019-12-18 ENCOUNTER — Other Ambulatory Visit: Payer: Self-pay

## 2019-12-18 VITALS — BP 128/86 | HR 98 | Temp 98.4°F | Resp 16 | Ht 63.0 in | Wt 248.3 lb

## 2019-12-18 DIAGNOSIS — F411 Generalized anxiety disorder: Secondary | ICD-10-CM

## 2019-12-18 DIAGNOSIS — J301 Allergic rhinitis due to pollen: Secondary | ICD-10-CM

## 2019-12-18 DIAGNOSIS — G43009 Migraine without aura, not intractable, without status migrainosus: Secondary | ICD-10-CM

## 2019-12-18 DIAGNOSIS — F509 Eating disorder, unspecified: Secondary | ICD-10-CM | POA: Diagnosis not present

## 2019-12-18 DIAGNOSIS — R600 Localized edema: Secondary | ICD-10-CM

## 2019-12-18 DIAGNOSIS — F331 Major depressive disorder, recurrent, moderate: Secondary | ICD-10-CM

## 2019-12-18 MED ORDER — NURTEC 75 MG PO TBDP: 1.0000 | ORAL_TABLET | Freq: Every day | ORAL | 2 refills | Status: DC | PRN

## 2019-12-18 NOTE — Progress Notes (Signed)
Name: Olivia King   MRN: [id number]    DOB: Oct 27, 1985   Date:12/18/2019       Progress Note  Subjective  Chief Complaint  Chief Complaint  Patient presents with  . Follow-up  . Depression    HPI  Lymphedema: she went to vascular surgeon, and was advised to use compression stocking hoses and because of sob advised to see cardiologist. She saw Cardiologist and was given Demadex to take one to two daily, cramps resolved with potassium supplementation.She states when very busy at work she is unable to take it because she does not able to make it to the bathrooms.   Morbid obesity: she states as a teenager she was in the 160 lbs range, but after her son was born in 2004 her weight stayed between 189-203 lbs , had her daughter in 2012 her weight went up to 270 lbs, but after her birth she was able to go back down to 203 lbs. She states in 2016 she took some otc diet pills and was going to the gym and her weight dropped about 40 lbs in a few months, she was back to 165 lbs. She told me back in April 2021 that  she can binge eat. She is able to avoid eating during the day - because she is so busy, but at night when upset with her children or not feeling well she eats to get comfort She states she feels out of control and is unable to stop eating, about twice a week, she feels very guilty afterwards. She states when she overeats she takes magnesium to help her have a bowel movement. She does not make herself vomit We started her on Vyvanse 07/2019 she has noticed that her mood is better but also that she is not craving as much foods. She has been off Vyvanse for a couple of months now because of cost, not covered by insurance. She states lowest weight on medication was 240 lbs but weight is back up when she stopped medication.   History of covid:she can finally smell ( back for the past two weeks), never lost sense of taste  MDD/GAD/eating disorder: she is under the care of Dr. Cherlyn Cushing and  therapist Huss[place], she is feeling tired again, lack of motivation, feels anxious. She is compliant with medication, feeling much worse since out of Vyvanse - insurance does not cover.   Migraine headache: no recent episode, last episode since Spring. She is allergic to Imitrex, takes Excedrin migraine or Fioricet, but we will try Nurteq now. She states migraine is described  As unilateral, right or left side, she has associated nausea, vomiting, photophobia, phonophobia.   Patient Active Problem List   Diagnosis Date Noted  . MDD (major depressive disorder), recurrent episode, mild ([place]) 10/10/2019  . GAD (generalized anxiety disorder) 10/10/2019  . Eating disorder 10/10/2019  . Lymphedema 04/10/2019  . Chronic venous insufficiency 04/10/2019  . Leg pain 04/10/2019  . Bilateral carpal tunnel syndrome 11/04/2018  . Morbid obesity ([place]) 06/14/2017  . Anxiety 03/24/2017  . Contraception management 01/06/2016  . Glucosuria 09/17/2015  . Major depressive disorder, recurrent ([place]) 08/19/2015  . Benign neoplasm of skin of nose 07/28/2015  . Low serum vitamin D 12/24/2014  . Migraine with aura 12/21/2014    Past Surgical History:  Procedure Laterality Date  . CESAREAN SECTION  2004 &2012  . contraceptive implant      Family History  Problem Relation Age of Onset  . Hypertension Mother   .  Cancer Mother        pancreatic  . Pancreatic cancer Mother   . Hypertension Brother   . Aneurysm Maternal Grandmother   . Heart attack Maternal Grandfather   . Hypertension Paternal Grandmother   . Heart failure Sister     Social History   Tobacco Use  . Smoking status: Current Every Day Smoker    Packs/day: 0.75    Years: 9.00    Pack years: 6.75    Types: Cigarettes    Start date: 03/05/2011  . Smokeless tobacco: Never Used  Substance Use Topics  . Alcohol use: Yes    Alcohol/week: 0.0 standard drinks     Current Outpatient Medications:  .  cholecalciferol (VITAMIN D) 1000  units tablet, Take 1,000 Units by mouth daily., Disp: , Rfl:  .  escitalopram (LEXAPRO) 10 MG tablet, Take 1 tablet (10 mg total) by mouth daily., Disp: 90 tablet, Rfl: 0 .  fluticasone (FLONASE) 50 MCG/ACT nasal spray, Place 2 sprays into both nostrils daily., Disp: 16 g, Rfl: 2 .  hydrOXYzine (ATARAX/VISTARIL) 10 MG tablet, Take 1 tablet (10 mg total) by mouth at bedtime as needed., Disp: 90 tablet, Rfl: 0 .  ibuprofen (ADVIL) 800 MG tablet, Take 1 tablet (800 mg total) by mouth every 8 (eight) hours as needed., Disp: 90 tablet, Rfl: 1 .  ipratropium (ATROVENT) 0.03 % nasal spray, ipratropium bromide 21 mcg (0.03 %) nasal spray, Disp: , Rfl:  .  lamoTRIgine (LAMICTAL) 25 MG tablet, Take 1 tablet (25 mg total) by mouth daily., Disp: 90 tablet, Rfl: 0 .  meloxicam (MOBIC) 15 MG tablet, TAKE 1 TABLET(15 MG) BY MOUTH DAILY. FOLLOW UP WITH MEDICAL DOCTOR BEFORE FURTHER REFILLS, Disp: 90 tablet, Rfl: 0 .  Multiple Vitamin (MULTI-VITAMINS) TABS, Take by mouth., Disp: , Rfl:  .  olopatadine (PATANOL) 0.1 % ophthalmic solution, Place 1 drop into both eyes 2 (two) times daily., Disp: 5 mL, Rfl: 2 .  potassium chloride SA (KLOR-CON) 20 MEQ tablet, Take 1-2 tablets (20-40 mEq total) by mouth daily., Disp: 180 tablet, Rfl: 0 .  loratadine (CLARITIN) 10 MG tablet, Take 1 tablet (10 mg total) by mouth daily. (Patient not taking: Reported on 12/18/2019), Disp: 90 tablet, Rfl: 0 .  torsemide (DEMADEX) 20 MG tablet, Take 2 tablets (40 mg total) by mouth every other day., Disp: 60 tablet, Rfl: 2  Allergies  Allergen Reactions  . Imitrex [Sumatriptan] Anaphylaxis    Throat and tongue swelling   . Penicillins Rash  . Trintellix [Vortioxetine]     Tingling hands and blurred vision     I personally reviewed active problem list, medication list, allergies, family history, social history, health maintenance with the patient/caregiver today.   ROS  Constitutional: Negative for fever , positive for  weight change.   Respiratory: Negative for cough and shortness of breath.   Cardiovascular: Negative for chest pain or palpitations.  Gastrointestinal: Negative for abdominal pain, no bowel changes.  Musculoskeletal: Negative for gait problem or joint swelling.  Skin: Negative for rash.  Neurological: Negative for dizziness or headache.  No other specific complaints in a complete review of systems (except as listed in HPI above).  Objective  Vitals:   12/18/19 0906  BP: 128/86  Pulse: 98  Resp: 16  Temp: 98.4 F (36.9 C)  TempSrc: Oral  SpO2: 99%  Weight: 248 lb 4.8 oz (112.6 kg)  Height: 5\' 3"  (1.6 m)    Body mass index is 43.98 kg/m.  Physical Exam  Constitutional: Patient appears well-developed and well-nourished. Obese  No distress.  HEENT: head atraumatic, normocephalic, pupils equal and reactive to light,  neck supple Cardiovascular: Normal rate, regular rhythm and normal heart sounds.  No murmur heard.  BLE edema - lymphedema . Pulmonary/Chest: Effort normal and breath sounds normal. No respiratory distress. Abdominal: Soft.  There is no tenderness. Psychiatric: Patient has a normal mood and affect. behavior is normal. Judgment and thought content normal.  PHQ2/9: Depression screen [facility] 2/9 12/18/2019 10/19/2019 09/15/2019 08/18/2019 06/23/2019  Decreased Interest 2 1 0 0 0  Down, Depressed, Hopeless 1 0 0 1 0  PHQ - 2 Score 3 1 0 1 0  Altered sleeping 2 0 0 0 0  Tired, decreased energy 3 1 - 0 0  Change in appetite 0 1 0 0 0  Feeling bad or failure about yourself  2 0 0 3 0  Trouble concentrating 0 1 0 2 1  Moving slowly or fidgety/restless 2 0 0 0 1  Suicidal thoughts 0 0 0 0 0  PHQ-9 Score 12 4 0 6 2  Difficult doing work/chores Very difficult - - - Not difficult at all  Some recent data might be hidden    phq 9 is positive   Fall Risk: Fall Risk  12/18/2019 06/23/2019 05/10/2019 02/15/2019 12/16/2018  Falls in the past year? 0 0 0 0 0  Number falls in past yr: 0 0 0 0 0   Injury with Fall? 1 0 0 0 0     Functional Status Survey: Is the patient deaf or have difficulty hearing?: No Does the patient have difficulty seeing, even when wearing glasses/contacts?: Yes Does the patient have difficulty concentrating, remembering, or making decisions?: Yes Does the patient have difficulty walking or climbing stairs?: No Does the patient have difficulty dressing or bathing?: No Does the patient have difficulty doing errands alone such as visiting a doctor's office or shopping?: No    Assessment & Plan  1. Moderate episode of recurrent major depressive disorder [facility])  She is under the care of Dr. Shea Evans and also therapist, feeling much worse since out of Vyvanse - not covered by insurance  2. GAD (generalized anxiety disorder)  Taking Lexapro and hydroxizine   3. Eating disorder, unspecified type  Seeing Dr. Shea Evans  4. Migraine without aura and without status migrainosus, not intractable  Stable at this time We will try Nurteq prn   5. Seasonal allergic rhinitis due to pollen  Doing well at this time   6. Bilateral lower extremity edema  Has lasix at home given by cardiologist

## 2019-12-18 NOTE — Patient Instructions (Signed)
Budget-Friendly Healthy Eating There are many ways to save money at the grocery store and continue to eat healthy. You can be successful if you:  Plan meals according to your budget.  Make a grocery list and only purchase food according to your grocery list.  Prepare food yourself. What are tips for following this plan?  Reading food labels  Compare food labels between brand name foods and the store brand. Often the nutritional value is the same, but the store brand is lower cost.  Look for products that do not have added sugar, fat, or salt (sodium). These often cost the same but are healthier for you. Products may be labeled as: ? Sugar-free. ? Nonfat. ? Low-fat. ? Sodium-free. ? Low-sodium.  Look for lean ground beef labeled as at least 92% lean and 8% fat. Shopping  Buy only the items on your grocery list and go only to the areas of the store that have the items on your list.  Use coupons only for foods and brands you normally buy. Avoid buying items you wouldn't normally buy simply because they are on sale.  Check online and in newspapers for weekly deals.  Buy healthy items from the bulk bins when available, such as herbs, spices, flour, pasta, nuts, and dried fruit.  Buy fruits and vegetables that are in season. Prices are usually lower on in-season produce.  Look at the unit price on the price tag. Use it to compare different brands and sizes to find out which item is the best deal.  Choose healthy items that are often low-cost, such as carrots, potatoes, apples, bananas, and oranges. Dried or canned beans are a low-cost protein source.  Buy in bulk and freeze extra food. Items you can buy in bulk include meats, fish, poultry, frozen fruits, and frozen vegetables.  Avoid buying "ready-to-eat" foods, such as pre-cut fruits and vegetables and pre-made salads.  If possible, shop around to discover where you can find the best prices. Consider other retailers such as  dollar stores, larger [employer], local fruit and vegetable stands, and farmers markets.  Do not shop when you are hungry. If you shop while hungry, it may be hard to stick to your list and budget.  Resist impulse buying. Use your grocery list as your official plan for the week.  Buy a variety of vegetables and fruits by purchasing fresh, frozen, and canned items.  Look at the top and bottom shelves for deals. Foods at eye level (eye level of an adult or child) are usually more expensive.  Be efficient with your time when shopping. The more time you spend at the store, the more money you are likely to spend.  To save money when choosing more expensive foods like meats and dairy: ? Choose cheaper cuts of meat, such as bone-in chicken thighs and drumsticks instead of skinless and boneless chicken. When you are ready to prepare the chicken, you can remove the skin yourself to make it healthier. ? Choose lean meats like chicken or [place] instead of beef. ? Choose canned seafood, such as tuna, salmon, or sardines. ? Buy eggs as a low-cost source of protein. ? Buy dried beans and peas, such as lentils, split peas, or kidney beans instead of meats. Dried beans and peas are a good alternative source of protein. ? Buy the larger tubs of yogurt instead of individual-sized containers.  Choose water instead of sodas and other sweetened beverages.  Avoid buying chips, cookies, and other "junk food." These  items are usually expensive and not healthy. Cooking  Make extra food and freeze the extras in meal-sized containers or in individual portions for fast meals and snacks.  Pre-cook on days when you have extra time to prepare meals in advance. You can keep these meals in the fridge or freezer and reheat for a quick meal.  When you come home from the grocery store, wash, peel, and cut fruits and vegetables so they are ready to use and eat. This will help reduce food waste. Meal planning  Do  not eat out or get fast food. Prepare food at home.  Make a grocery list and make sure to bring it with you to the store. If you have a smart phone, you could use your phone to create your shopping list.  Plan meals and snacks according to a grocery list and budget you create.  Use leftovers in your meal plan for the week.  Look for recipes where you can cook once and make enough food for two meals.  Include budget-friendly meals like stews, casseroles, and stir-fry dishes.  Try some meatless meals or try "no cook" meals like salads.  Make sure that half your plate is filled with fruits or vegetables. Choose from fresh, frozen, or canned fruits and vegetables. If eating canned, remember to rinse them before eating. This will remove any excess salt added for packaging. Summary  Eating healthy on a budget is possible if you plan your meals according to your budget, purchase according to your budget and grocery list, and prepare food yourself.  Tips for buying more food on a limited budget include buying generic brands, using coupons only for foods you normally buy, and buying healthy items from the bulk bins when available.  Tips for buying cheaper food to replace expensive food include choosing cheaper, lean cuts of meat, and buying dried beans and peas. This information is not intended to replace advice given to you by your health care provider. Make sure you discuss any questions you have with your health care provider. Document Revised: 04/07/2017 Document Reviewed: 04/07/2017 Elsevier Patient Education  Olivia King.

## 2019-12-27 ENCOUNTER — Other Ambulatory Visit: Payer: Self-pay

## 2019-12-27 ENCOUNTER — Ambulatory Visit (INDEPENDENT_AMBULATORY_CARE_PROVIDER_SITE_OTHER): Admitting: Licensed Clinical Social Worker

## 2019-12-27 DIAGNOSIS — F330 Major depressive disorder, recurrent, mild: Secondary | ICD-10-CM

## 2019-12-27 DIAGNOSIS — F411 Generalized anxiety disorder: Secondary | ICD-10-CM | POA: Diagnosis not present

## 2019-12-27 DIAGNOSIS — F509 Eating disorder, unspecified: Secondary | ICD-10-CM | POA: Diagnosis not present

## 2019-12-27 NOTE — Progress Notes (Signed)
Virtual Visit via Video Note  I connected with Olivia King on 12/27/19 at  9:00 AM EDT by a video enabled telemedicine application and verified that I am speaking with the correct person using two identifiers.  Location: Patient: home Provider: remote office [place], [place])   I discussed the limitations of evaluation and management by telemedicine and the availability of in person appointments. The patient expressed understanding and agreed to proceed.   The patient was advised to call back or seek an in-person evaluation if the symptoms worsen or if the condition fails to improve as anticipated.  I provided 30 minutes of non-face-to-face time during this encounter.   Olivia Steinhauser R Jamyrah Saur, LCSW    THERAPIST PROGRESS NOTE  Session Time: 9:00-9:30 am  Participation Level: Active  Behavioral Response: Neat and Well GroomedAlertDepressed  Type of Therapy: Individual Therapy  Treatment Goals addressed: Anxiety and Coping  Interventions: CBT and Supportive  Summary: Olivia King is a 34 y.o. female who presents with decreased depression symptoms, slight increase in anxiety symptoms, and healthier eating patterns. Pt reports that she is compliant with medications (stopped vyvanse due to cost), and is getting good quality sleep.   Allowed pt to explore and express thoughts and feelings associated with work stress. Pt feels that others at work are critical of her personality at work and is not trusting of the providers "they will write you up while they are standing there talking to you". Allowed pt to share several work-related situations that have triggered stress. Pt feels that she has gotten in trouble at work for laughing and for stating that she was going to walk out. This makes pt unsure of who to trust at work.    Pts sister has baby in the house--pt is helping sister with the baby as much as she can. Pt is enjoying spending time with the baby. Discussed quality of  relationships with all family members.    Encouraged pt to focus on self care and life balance. Pt wants to get help cleaning home but not trustful of a stranger coming into her home.  Explored mistrust with pt.   Suicidal/Homicidal: No  SI, HI, or AVH reported at time of session.  Therapist Response: Olivia King reports a decrease in overall depression symptoms and slight increase in anxiety symptoms. Pt reports an improvement in eating-related behaviors.  Symptom reduction is an indicator of continuing progress.   Plan: Return again in 3 weeks. Ongoing treatment plan to include maintaining current levels of progress, mood management, stress/anxiety management and maintaining healthy eating patterns.   Diagnosis: Axis I: Generalized Anxiety Disorder and Major Depression, Recurrent severe    Axis II: No diagnosis    Olivia Bo Cyndee Giammarco, LCSW 12/27/2019

## 2020-01-02 ENCOUNTER — Other Ambulatory Visit: Payer: Self-pay

## 2020-01-02 ENCOUNTER — Encounter: Payer: Self-pay | Admitting: Psychiatry

## 2020-01-02 ENCOUNTER — Telehealth (INDEPENDENT_AMBULATORY_CARE_PROVIDER_SITE_OTHER): Admitting: Psychiatry

## 2020-01-02 DIAGNOSIS — F331 Major depressive disorder, recurrent, moderate: Secondary | ICD-10-CM

## 2020-01-02 DIAGNOSIS — F411 Generalized anxiety disorder: Secondary | ICD-10-CM | POA: Diagnosis not present

## 2020-01-02 DIAGNOSIS — F509 Eating disorder, unspecified: Secondary | ICD-10-CM | POA: Diagnosis not present

## 2020-01-02 MED ORDER — ESCITALOPRAM OXALATE 20 MG PO TABS: 20.0000 mg | ORAL_TABLET | Freq: Every day | ORAL | 0 refills | Status: DC

## 2020-01-02 NOTE — Progress Notes (Signed)
This note is not being shared with the patient for the following reason: To prevent harm (release of this note would result in harm to the life or physical safety of the patient or another).  Provider Location : [facility] Patient Location : Home  Participants: Patient , Provider  Virtual Visit via Video Note  I connected with Olivia King on 01/02/20 at  4:40 PM EDT by a video enabled telemedicine application and verified that I am speaking with the correct person using two identifiers.   I discussed the limitations of evaluation and management by telemedicine and the availability of in person appointments. The patient expressed understanding and agreed to proceed.    I discussed the assessment and treatment plan with the patient. The patient was provided an opportunity to ask questions and all were answered. The patient agreed with the plan and demonstrated an understanding of the instructions.   The patient was advised to call back or seek an in-person evaluation if the symptoms worsen or if the condition fails to improve as anticipated.   [facility] MD OP Progress Note  01/02/2020 4:59 PM Olivia King  MRN:  [id number]  Chief Complaint:  Chief Complaint    Follow-up     HPI: Olivia King is a 34 year old female, employed, lives in [place], widowed, has a history of MDD, GAD, anxiety, eating disorder unspecified, was evaluated by telemedicine today.  Patient today reports she is currently feeling depressed.  She reports sadness, lack of motivation, low energy.  She reports this has been getting worse since the past few weeks.  She reports she works 3 days a week and the days that she works she is able to push herself.  She however reports the days that she does not work she is unable to do anything around the house.  She reports she is currently struggling with reduced appetite.  She reports she eats once a day since the past few weeks.  She denies any binging on food.  Patient  denies any sleep problems.  She does report death wish, passive thoughts about not wanting to be here however denies any active suicidal thoughts or plan.  Patient reports she is in therapy sessions on a regular basis and reports therapy sessions as beneficial.  Patient denies any other concerns today.  Visit Diagnosis:    ICD-10-CM   1. MDD (major depressive disorder), recurrent episode, moderate (HCC)  F33.1 escitalopram (LEXAPRO) 20 MG tablet  2. GAD (generalized anxiety disorder)  F41.1   3. Eating disorder, unspecified type  F50.9     Past Psychiatric History: I have reviewed past psychiatric history from my progress note on 10/10/2019.  Past trials of medications like Lexapro, Vyvanse, Abilify  Past Medical History:  Past Medical History:  Diagnosis Date  . Cigarette smoker   . COVID-19 virus detected 03/21/2019  . IUD contraception 02/06/2016   Placed January 23, 9484 by Ardeth Perfect, [place]  . Major depressive disorder, recurrent ([place]) 08/19/2015   zoloft made her cry; cymbalta made her too sleepy  . Migraine headache     Past Surgical History:  Procedure Laterality Date  . CESAREAN SECTION  2004 &2012  . contraceptive implant      Family Psychiatric History: I have reviewed family psychiatric history from my progress note on 10/10/2019  Family History:  Family History  Problem Relation Age of Onset  . Hypertension Mother   . Cancer Mother        pancreatic  . Pancreatic  cancer Mother   . Hypertension Brother   . Aneurysm Maternal Grandmother   . Heart attack Maternal Grandfather   . Hypertension Paternal Grandmother   . Heart failure Sister     Social History: I have reviewed social history from my progress note on 10/10/2019 Social History   Socioeconomic History  . Marital status: Widowed    Spouse name: Not on file  . Number of children: 2  . Years of education: Not on file  . Highest education level: Associate degree: occupational,  Hotel manager, or vocational program  Occupational History  . Occupation: CMA    Comment: [employer]  Tobacco Use  . Smoking status: Current Every Day Smoker    Packs/day: 0.75    Years: 9.00    Pack years: 6.75    Types: Cigarettes    Start date: 03/05/2011  . Smokeless tobacco: Never Used  Vaping Use  . Vaping Use: Never used  Substance and Sexual Activity  . Alcohol use: Yes    Alcohol/week: 0.0 standard drinks  . Drug use: No  . Sexual activity: Yes    Partners: Male    Birth control/protection: I.U.D.    Comment: Patient has liletta IUD  Other Topics Concern  . Not on file  Social History Narrative   She is a widow, he died August 07, 2009 while he was deployed.    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 8756   Social Determinants of Health   Financial Resource Strain: Low Risk   . Difficulty of Paying Living Expenses: Not hard at all  Food Insecurity: No Food Insecurity  . Worried About Charity fundraiser in the Last Year: Never true  . Ran Out of Food in the Last Year: Never true  Transportation Needs: No Transportation Needs  . Lack of Transportation (Medical): No  . Lack of Transportation (Non-Medical): No  Physical Activity: Sufficiently Active  . Days of Exercise per Week: 3 days  . Minutes of Exercise per Session: 50 min  Stress: No Stress Concern Present  . Feeling of Stress : Not at all  Social Connections: Moderately Integrated  . Frequency of Communication with Friends and Family: More than three times a week  . Frequency of Social Gatherings with Friends and Family: More than three times a week  . Attends Religious Services: More than 4 times per year  . Active Member of Clubs or Organizations: Yes  . Attends Archivist Meetings: More than 4 times per year  . Marital Status: Widowed    Allergies:  Allergies  Allergen Reactions  . Imitrex [Sumatriptan] Anaphylaxis    Throat and tongue swelling   . Penicillins Rash  . Trintellix [Vortioxetine]      Tingling hands and blurred vision     Metabolic Disorder Labs: Lab Results  Component Value Date   HGBA1C 5.1 05/10/2019   MPG 100 05/10/2019   MPG 103 03/04/2018   No results found for: PROLACTIN Lab Results  Component Value Date   CHOL 178 04/09/2017   TRIG 94 04/09/2017   HDL 49 (L) 04/09/2017   CHOLHDL 3.6 04/09/2017   VLDL 11 03/19/2016   LDLCALC 110 (H) 04/09/2017   LDLCALC 98 03/19/2016   Lab Results  Component Value Date   TSH 2.17 05/10/2019   TSH 3.10 04/09/2017    Therapeutic Level Labs: No results found for: LITHIUM No results found for: VALPROATE No components found for:  CBMZ  Current Medications: Current Outpatient Medications  Medication Sig Dispense Refill  .  cholecalciferol (VITAMIN D) 1000 units tablet Take 1,000 Units by mouth daily.    Marland Kitchen escitalopram (LEXAPRO) 20 MG tablet Take 1 tablet (20 mg total) by mouth daily. 90 tablet 0  . fluticasone (FLONASE) 50 MCG/ACT nasal spray Place 2 sprays into both nostrils daily. 16 g 2  . hydrOXYzine (ATARAX/VISTARIL) 10 MG tablet Take 1 tablet (10 mg total) by mouth at bedtime as needed. 90 tablet 0  . ibuprofen (ADVIL) 800 MG tablet Take 1 tablet (800 mg total) by mouth every 8 (eight) hours as needed. 90 tablet 1  . ipratropium (ATROVENT) 0.03 % nasal spray ipratropium bromide 21 mcg (0.03 %) nasal spray    . lamoTRIgine (LAMICTAL) 25 MG tablet Take 1 tablet (25 mg total) by mouth daily. 90 tablet 0  . loratadine (CLARITIN) 10 MG tablet Take 1 tablet (10 mg total) by mouth daily. (Patient not taking: Reported on 12/18/2019) 90 tablet 0  . meloxicam (MOBIC) 15 MG tablet TAKE 1 TABLET(15 MG) BY MOUTH DAILY. FOLLOW UP WITH MEDICAL DOCTOR BEFORE FURTHER REFILLS 90 tablet 0  . Multiple Vitamin (MULTI-VITAMINS) TABS Take by mouth.    Marland Kitchen olopatadine (PATANOL) 0.1 % ophthalmic solution Place 1 drop into both eyes 2 (two) times daily. 5 mL 2  . potassium chloride SA (KLOR-CON) 20 MEQ tablet Take 1-2 tablets (20-40 mEq  total) by mouth daily. 180 tablet 0  . Rimegepant Sulfate (NURTEC) 75 MG TBDP Take 1 tablet by mouth daily as needed. 9 tablet 2  . torsemide (DEMADEX) 20 MG tablet Take 2 tablets (40 mg total) by mouth every other day. 60 tablet 2   No current facility-administered medications for this visit.     Musculoskeletal: Strength & Muscle Tone: UTA Gait & Station: normal Patient leans: N/A  Psychiatric Specialty Exam: Review of Systems  Psychiatric/Behavioral: Positive for dysphoric mood.  All other systems reviewed and are negative.   There were no vitals taken for this visit.There is no height or weight on file to calculate BMI.  General Appearance: Casual  Eye Contact:  Fair  Speech:  Clear and Coherent  Volume:  Normal  Mood:  Depressed  Affect:  Congruent  Thought Process:  Goal Directed and Descriptions of Associations: Intact  Orientation:  Full (Time, Place, and Person)  Thought Content: Logical   Suicidal Thoughts:  No Does report recent SI - Passive , denies now  Homicidal Thoughts:  No  Memory:  Immediate;   Fair Recent;   Fair Remote;   Fair  Judgement:  Fair  Insight:  Fair  Psychomotor Activity:  Normal  Concentration:  Concentration: Fair and Attention Span: Fair  Recall:  [employer] of Knowledge: Good  Language: Fair  Akathisia:  No  Handed:  Right  AIMS (if indicated): UTA  Assets:  Communication Skills Desire for Improvement Housing Social Support  ADL's:  Intact  Cognition: WNL  Sleep:  Fair   Screenings: GAD-7     Video Visit from 10/10/2019 in [facility] Office Visit from 06/23/2019 in [facility] Office Visit from 03/29/2019 in [facility] Office Visit from 08/30/2018 in [facility] Office Visit from 07/06/2018 in [facility]  Total GAD-7 Score 10 3 5 1 4     PHQ2-9     Office Visit from 12/18/2019 in [facility]  from 10/19/2019 in [facility] Office Visit from 09/15/2019 in [facility] Office Visit from 08/18/2019  in [facility] Office Visit from 06/23/2019 in [facility]  PHQ-2 Total Score 3 1 0 1 0  PHQ-9 Total Score 12 4 0 6 2       Assessment and Plan: Olivia King is a 34 year old female, widowed, lives in [place], employed, has a history of depression, anxiety, eating disorder, lymphedema, obesity was evaluated by telemedicine today.  Patient is biologically predisposed given her history of trauma, medical problems.  Patient with psychosocial stressors of the death of her husband while in [employer], her own history of trauma, pandemic, job related stresses.  She is currently struggling with depressive symptoms and will benefit from medication readjustment and psychotherapy sessions. Risk for suicide-worsening depressive symptoms, recent passive suicidal thoughts, widowed, history of trauma Protective factors are she is motivated to stay in treatment, denies past history of suicide attempts, denies family history of suicide, denies access to gun, denies active plans, denies substance abuse problems, feels responsible for her children. Acute risk for suicide is hence low.   Plan MDD-unstable Increase Lexapro to 20 mg p.o. daily Lamictal 25 mg p.o. daily  GAD-improving Lexapro as prescribed Continue CBT  Eating disorder unspecified-improving She currently denies any binging on food. She is no longer taking phentermine prescribed by her primary care provider. She reports she is currently not able to eat much and eats maybe 1 meal per day. Unknown if this is a medication side effect versus due to her depression. Continue CBT.  Tobacco use disorder-unstable Provided counseling.  Follow-up in clinic in 4 weeks or sooner if needed.  Crisis plan discussed with patient.  I have spent atleast 20 minutes face  to face with patient today. More than 50 % of the time was spent for preparing to see the patient ( e.g., review of test, records ), obtaining and to review and separately obtained history , ordering medications and test ,psychoeducation and supportive psychotherapy and care coordination,as well as documenting clinical information in electronic health record. This note was generated in part or whole with voice recognition software. Voice recognition is usually quite accurate but there are transcription errors that can and very often do occur. I apologize for any typographical errors that were not detected and corrected.         Ursula Alert, MD 01/03/2020, 8:25 AM

## 2020-01-15 ENCOUNTER — Ambulatory Visit (INDEPENDENT_AMBULATORY_CARE_PROVIDER_SITE_OTHER): Admitting: Vascular Surgery

## 2020-01-17 ENCOUNTER — Other Ambulatory Visit: Payer: Self-pay

## 2020-01-17 ENCOUNTER — Ambulatory Visit (INDEPENDENT_AMBULATORY_CARE_PROVIDER_SITE_OTHER): Admitting: Licensed Clinical Social Worker

## 2020-01-17 DIAGNOSIS — F411 Generalized anxiety disorder: Secondary | ICD-10-CM | POA: Diagnosis not present

## 2020-01-17 DIAGNOSIS — F331 Major depressive disorder, recurrent, moderate: Secondary | ICD-10-CM | POA: Diagnosis not present

## 2020-01-17 NOTE — Progress Notes (Signed)
Virtual Visit via Video Note  I connected with Olivia King on 01/17/20 at 11:00 AM EDT by a video enabled telemedicine application and verified that I am speaking with the correct person using two identifiers.  Location: Patient: home Provider: remote office [place], [place])   I discussed the limitations of evaluation and management by telemedicine and the availability of in person appointments. The patient expressed understanding and agreed to proceed.   The patient was advised to call back or seek an in-person evaluation if the symptoms worsen or if the condition fails to improve as anticipated.  I provided 20 minutes of non-face-to-face time during this encounter.   Janequa Kipnis R Fannie Gathright, LCSW   THERAPIST PROGRESS NOTE  Session Time: 11:00-11:20a  Participation Level: Minimal  Behavioral Response: NeatDrowsyDepressed  Type of Therapy: Individual Therapy  Treatment Goals addressed: Coping  Interventions: Supportive  Summary: Olivia King is a 34 y.o. female who presents with symptoms related to her diagnoses (depression, anxiety). Pt reports that she is compliant with medication, sleeping ok, and engaging with loved ones.  Pt planning a trip to the mountains with family and trip to the beach by herself.  Pt disclosed that she has had a recent breakup with partner, but is not in a place where she is ready to explore thoughts and feelings about it. Supported pt unconditionally and assured that I will allow her to discuss only what she wants to discuss.   Pt very tired, sleepy, and yawning throughout session--ended session after a few minutes because pt states "I'm really doing okay right now".   Encouraged pt to continue focusing on self care, life balance, and engaging socially with loved ones.   Suicidal/Homicidal: No  Therapist Response: Pt was not very engaged throughout session, so LCSW clinician is unable to assess progress at this session.   Plan: Return  again in 3 weeks. Encouraged pt to write things down--having a hard time discussing things in session using words. The ongoing treatment plan includes continuing to build skills to manage mood, trauma processing, improve stress/anxiety management, emotion regulation, distress tolerance, and behavior modification.    Diagnosis: Axis I: Major depressive disorder, recurrent, moderate; Generalized anxiety disorder    Axis II: No diagnosis    Rachel Bo Tien Aispuro, LCSW 01/17/2020

## 2020-02-07 ENCOUNTER — Encounter (INDEPENDENT_AMBULATORY_CARE_PROVIDER_SITE_OTHER): Payer: Self-pay | Admitting: Vascular Surgery

## 2020-02-13 ENCOUNTER — Other Ambulatory Visit: Payer: Self-pay

## 2020-02-13 ENCOUNTER — Encounter: Payer: Self-pay | Admitting: Psychiatry

## 2020-02-13 ENCOUNTER — Telehealth (INDEPENDENT_AMBULATORY_CARE_PROVIDER_SITE_OTHER): Admitting: Psychiatry

## 2020-02-13 DIAGNOSIS — F17200 Nicotine dependence, unspecified, uncomplicated: Secondary | ICD-10-CM

## 2020-02-13 DIAGNOSIS — F331 Major depressive disorder, recurrent, moderate: Secondary | ICD-10-CM | POA: Diagnosis not present

## 2020-02-13 DIAGNOSIS — F509 Eating disorder, unspecified: Secondary | ICD-10-CM

## 2020-02-13 DIAGNOSIS — F411 Generalized anxiety disorder: Secondary | ICD-10-CM | POA: Diagnosis not present

## 2020-02-13 MED ORDER — LAMOTRIGINE 25 MG PO TABS: 25.0000 mg | ORAL_TABLET | Freq: Every day | ORAL | 0 refills | Status: DC

## 2020-02-13 MED ORDER — BUPROPION HCL ER (XL) 150 MG PO TB24: 150.0000 mg | ORAL_TABLET | Freq: Every day | ORAL | 1 refills | Status: DC

## 2020-02-13 NOTE — Patient Instructions (Signed)

## 2020-02-13 NOTE — Progress Notes (Signed)
Virtual Visit via Video Note  I connected with [name] on 02/13/20 at  8:30 AM EDT by a video enabled telemedicine application and verified that I am speaking with the correct person using two identifiers.   Location Provider Location : [facility] Patient Location : Work  Participants: Patient , Provider  I discussed the limitations of evaluation and management by telemedicine and the availability of in person appointments. The patient expressed understanding and agreed to proceed.  I discussed the assessment and treatment plan with the patient. The patient was provided an opportunity to ask questions and all were answered. The patient agreed with the plan and demonstrated an understanding of the instructions.   The patient was advised to call back or seek an in-person evaluation if the symptoms worsen or if the condition fails to improve as anticipated.   [place] MD OP Progress Note  02/13/2020 8:58 AM Olivia King  MRN:  [id number]  Chief Complaint:  Chief Complaint    Follow-up     HPI: Olivia King is a 34 year old female, employed, lives in [place], widowed, has a history of MDD, GAD, eating disorder unspecified was evaluated by telemedicine today.   Patient today reports she is currently making progress.  She does not feel as depressed as she used to before.  The medications are effective.  She reports work is going well and she is able to focus.  She works 3 days a week, 12-hour shifts.  She denies any side effects to Lexapro.  Patient however reports the days that she is off she tends to stay in bed longer.  She reports in spite of having a good night sleep ,she goes back to bed  in the morning after dropping off her children and stays in bed till 12 noon.  She reports it may not because she is sleepy or tired that she does it, it could be her way of coping with life.  However she is not sure why she does it.  She denies any snoring, apneic episode or dozing off  episodes.  She reports she has lost 10 pounds and her binging on food has improved.  She denies any suicidality, homicidality or perceptual disturbances.  Patient does report that she has tried Wellbutrin in the past however she does not clearly remember if she had side effects.  She is agreeable to starting it again for her excessive sleepiness during the day.  She denies any other concerns today.  Visit Diagnosis:    ICD-10-CM   1. MDD (major depressive disorder), recurrent episode, moderate (HCC)  F33.1 buPROPion (WELLBUTRIN XL) 150 MG 24 hr tablet  2. GAD (generalized anxiety disorder)  F41.1 lamoTRIgine (LAMICTAL) 25 MG tablet  3. Eating disorder, unspecified type  F50.9   4. Tobacco use disorder  F17.200     Past Psychiatric History: I have reviewed past psychiatric history from my progress note on 10/10/2019.  Past trials of medications like Lexapro, Vyvanse, Abilify  Past Medical History:  Past Medical History:  Diagnosis Date  . Cigarette smoker   . COVID-19 virus detected 03/21/2019  . IUD contraception 02/06/2016   Placed January 22, 4097 by Ardeth Perfect, [place]  . Major depressive disorder, recurrent ([place]) 08/19/2015   zoloft made her cry; cymbalta made her too sleepy  . Migraine headache     Past Surgical History:  Procedure Laterality Date  . CESAREAN SECTION  2004 &2012  . contraceptive implant      Family Psychiatric History: I  have reviewed family psychiatric history from my progress note from 10/10/2019.  Family History:  Family History  Problem Relation Age of Onset  . Hypertension Mother   . Cancer Mother        pancreatic  . Pancreatic cancer Mother   . Hypertension Brother   . Aneurysm Maternal Grandmother   . Heart attack Maternal Grandfather   . Hypertension Paternal Grandmother   . Heart failure Sister     Social History: I have reviewed social history from my progress note on 10/10/2019. Social History   Socioeconomic History   . Marital status: Widowed    Spouse name: Not on file  . Number of children: 2  . Years of education: Not on file  . Highest education level: Associate degree: occupational, Hotel manager, or vocational program  Occupational History  . Occupation: CMA    Comment: [employer]  Tobacco Use  . Smoking status: Current Every Day Smoker    Packs/day: 0.75    Years: 9.00    Pack years: 6.75    Types: Cigarettes    Start date: 03/05/2011  . Smokeless tobacco: Never Used  Vaping Use  . Vaping Use: Never used  Substance and Sexual Activity  . Alcohol use: Yes    Alcohol/week: 0.0 standard drinks  . Drug use: No  . Sexual activity: Yes    Partners: Male    Birth control/protection: I.U.D.    Comment: Patient has liletta IUD  Other Topics Concern  . Not on file  Social History Narrative   She is a widow, he died 2009/07/26 while he was deployed.    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 4970   Social Determinants of Health   Financial Resource Strain: Low Risk   . Difficulty of Paying Living Expenses: Not hard at all  Food Insecurity: No Food Insecurity  . Worried About Charity fundraiser in the Last Year: Never true  . Ran Out of Food in the Last Year: Never true  Transportation Needs: No Transportation Needs  . Lack of Transportation (Medical): No  . Lack of Transportation (Non-Medical): No  Physical Activity: Sufficiently Active  . Days of Exercise per Week: 3 days  . Minutes of Exercise per Session: 50 min  Stress: No Stress Concern Present  . Feeling of Stress : Not at all  Social Connections: Moderately Integrated  . Frequency of Communication with Friends and Family: More than three times a week  . Frequency of Social Gatherings with Friends and Family: More than three times a week  . Attends Religious Services: More than 4 times per year  . Active Member of Clubs or Organizations: Yes  . Attends Archivist Meetings: More than 4 times per year  . Marital Status: Widowed     Allergies:  Allergies  Allergen Reactions  . Imitrex [Sumatriptan] Anaphylaxis    Throat and tongue swelling   . Penicillins Rash  . Trintellix [Vortioxetine]     Tingling hands and blurred vision     Metabolic Disorder Labs: Lab Results  Component Value Date   HGBA1C 5.1 05/10/2019   MPG 100 05/10/2019   MPG 103 03/04/2018   No results found for: PROLACTIN Lab Results  Component Value Date   CHOL 178 04/09/2017   TRIG 94 04/09/2017   HDL 49 (L) 04/09/2017   CHOLHDL 3.6 04/09/2017   VLDL 11 03/19/2016   LDLCALC 110 (H) 04/09/2017   LDLCALC 98 03/19/2016   Lab Results  Component Value Date  TSH 2.17 05/10/2019   TSH 3.10 04/09/2017    Therapeutic Level Labs: No results found for: LITHIUM No results found for: VALPROATE No components found for:  CBMZ  Current Medications: Current Outpatient Medications  Medication Sig Dispense Refill  . buPROPion (WELLBUTRIN XL) 150 MG 24 hr tablet Take 1 tablet (150 mg total) by mouth daily. 15 tablet 1  . cholecalciferol (VITAMIN D) 1000 units tablet Take 1,000 Units by mouth daily.    Marland Kitchen escitalopram (LEXAPRO) 20 MG tablet Take 1 tablet (20 mg total) by mouth daily. 90 tablet 0  . fluticasone (FLONASE) 50 MCG/ACT nasal spray Place 2 sprays into both nostrils daily. 16 g 2  . hydrOXYzine (ATARAX/VISTARIL) 10 MG tablet Take 1 tablet (10 mg total) by mouth at bedtime as needed. 90 tablet 0  . ibuprofen (ADVIL) 800 MG tablet Take 1 tablet (800 mg total) by mouth every 8 (eight) hours as needed. 90 tablet 1  . ipratropium (ATROVENT) 0.03 % nasal spray ipratropium bromide 21 mcg (0.03 %) nasal spray    . lamoTRIgine (LAMICTAL) 25 MG tablet Take 1 tablet (25 mg total) by mouth daily. 90 tablet 0  . loratadine (CLARITIN) 10 MG tablet Take 1 tablet (10 mg total) by mouth daily. (Patient not taking: Reported on 12/18/2019) 90 tablet 0  . meloxicam (MOBIC) 15 MG tablet TAKE 1 TABLET(15 MG) BY MOUTH DAILY. FOLLOW UP WITH MEDICAL DOCTOR  BEFORE FURTHER REFILLS 90 tablet 0  . metroNIDAZOLE (FLAGYL) 500 MG tablet Take 500 mg by mouth 2 (two) times daily.    . Multiple Vitamin (MULTI-VITAMINS) TABS Take by mouth.    Marland Kitchen olopatadine (PATANOL) 0.1 % ophthalmic solution Place 1 drop into both eyes 2 (two) times daily. 5 mL 2  . potassium chloride SA (KLOR-CON) 20 MEQ tablet Take 1-2 tablets (20-40 mEq total) by mouth daily. 180 tablet 0  . Rimegepant Sulfate (NURTEC) 75 MG TBDP Take 1 tablet by mouth daily as needed. 9 tablet 2  . torsemide (DEMADEX) 20 MG tablet Take 2 tablets (40 mg total) by mouth every other day. 60 tablet 2   No current facility-administered medications for this visit.     Musculoskeletal: Strength & Muscle Tone: UTA Gait & Station: normal Patient leans: N/A  Psychiatric Specialty Exam: Review of Systems  Psychiatric/Behavioral: Positive for sleep disturbance.  All other systems reviewed and are negative.   There were no vitals taken for this visit.There is no height or weight on file to calculate BMI.  General Appearance: Casual  Eye Contact:  Fair  Speech:  Normal Rate  Volume:  Normal  Mood:  Euthymic  Affect:  Congruent  Thought Process:  Goal Directed and Descriptions of Associations: Intact  Orientation:  Full (Time, Place, and Person)  Thought Content: Logical   Suicidal Thoughts:  No  Homicidal Thoughts:  No  Memory:  Immediate;   Fair Recent;   Fair Remote;   Fair  Judgement:  Fair  Insight:  Fair  Psychomotor Activity:  Normal  Concentration:  Concentration: Fair and Attention Span: Fair  Recall:  [employer] of Knowledge: Fair  Language: Fair  Akathisia:  No  Handed:  Right  AIMS (if indicated): UTA  Assets:  Communication Skills Desire for Improvement Housing Social Support  ADL's:  Intact  Cognition: WNL  Sleep:  Excessive   Screenings: GAD-7     Video Visit from 10/10/2019 in [place] Office Visit from 06/23/2019 in [facility]  Visit from 03/29/2019 in [facility] Office Visit from 08/30/2018 in [facility] Office Visit from 07/06/2018 in [facility]  Total GAD-7 Score 10 3 5 1 4     PHQ2-9     Office Visit from 12/18/2019 in [facility] Counselor from 10/19/2019 in [place] Office Visit from 09/15/2019 in [facility] Office Visit from 08/18/2019 in [facility] Office Visit from 06/23/2019 in [facility]  PHQ-2 Total Score 3 1 0 1 0  PHQ-9 Total Score 12 4 0 6 2       Assessment and Plan: Olivia King is a 34 year old female, widowed, lives in [place], employed, has a history of MDD, GAD, eating disorder unspecified, lymphedema, obesity was evaluated by telemedicine today.  She is biologically predisposed given her history of trauma, medical problems.  Patient with psychosocial stressors of death of her husband while in [employer], however history of trauma, pandemic, job related stressors and relationship struggles.  Patient however is currently making progress however continues to struggle with excessive sleep during the day.  Plan as noted below.  Plan MDD-improving Lexapro 20 mg p.o. daily. Start Wellbutrin XL 150 mg p.o. daily in the morning. Lamictal 25 mg daily  GAD-improving Lexapro as prescribed Continue CBT  Eating disorder-improving She denies binging on food. Continue CBT  Tobacco use disorder-improving Provided counseling  Follow-up in clinic in 4 weeks or sooner if needed.  I have spent atleast 20 minutes face to face by video with patient today. More than 50 % of the time was spent for preparing to see the patient ( e.g., review of test, records ), ordering medications and test ,psychoeducation and supportive psychotherapy and care coordination,as well as documenting clinical information in electronic health  record. This note was generated in part or whole with voice recognition software. Voice recognition is usually quite accurate but there are transcription errors that can and very often do occur. I apologize for any typographical errors that were not detected and corrected.        Ursula Alert, MD 02/13/2020, 8:58 AM

## 2020-02-16 ENCOUNTER — Ambulatory Visit (INDEPENDENT_AMBULATORY_CARE_PROVIDER_SITE_OTHER): Admitting: Licensed Clinical Social Worker

## 2020-02-16 ENCOUNTER — Other Ambulatory Visit: Payer: Self-pay

## 2020-02-16 DIAGNOSIS — F331 Major depressive disorder, recurrent, moderate: Secondary | ICD-10-CM

## 2020-02-16 DIAGNOSIS — F411 Generalized anxiety disorder: Secondary | ICD-10-CM

## 2020-02-16 NOTE — Progress Notes (Signed)
Virtual Visit via Video Note  I connected with Olivia King on 02/16/20 at  8:00 AM EDT by a video enabled telemedicine application and verified that I am speaking with the correct person using two identifiers.  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 The patient expressed understanding and agreed to proceed.   The patient was advised to call back or seek an in-person evaluation if the symptoms worsen or if the condition fails to improve as anticipated.  I provided 20 minutes of non-face-to-face time during this encounter.   Taimane Stimmel R Shanna Un, LCSW    THERAPIST PROGRESS NOTE  Session Time: 8:10-8:30a  Participation Level: Active  Behavioral Response: GuardedAlertgenerally pleasant and positive  Type of Therapy: Individual Therapy  Treatment Goals addressed: Coping  Interventions: Supportive  Summary: Olivia King is a 34 y.o. female who presents with improving symptoms related to depression and anxiety diagnosis. Pt reports recent medication change by psychiatrist and that she is compliant with medications. Pt reports that mood is more stable and that she is managing stress and anxiety better than in previous sessions.  Pt currently at work at time of session--expressed LCSW's concerns of confidentiality/privacy but patient wanted to continue with session.  Allowed pt to explore and express thoughts and feelings about recent life stressors and pt reports that she feels she is managing stress/anxiety well. Pt reports that family relationships are good, work environment is good, and that pt is trying to be intentional about overall self care and keeping life in balance. Reviewed stress management and the importance of positive social engagement.   Discussed recent ending of a relationship--pt feels this has been a very  positive influence in her life and feels like a weight has been lifted from her shoulders.   Praised pts overall progress and encouraged maintaining current levels of progress by continuing self-care behaviors.  Follow up 6 wks.  Suicidal/Homicidal: No  SI, HI, or AVH reported at time of session.  Therapist Response: Andrell reports improvement in overall mood regulation and anxiety/stress management. Developments continue in the areas of family and relational functioning and occupational functioning/achievement. Treatment progressing well. Goal to maintain current levels of progress.   Plan: Return again in 6 weeks. The ongoing treatment plan includes maintaining current levels of progress and continuing to build skills to manage mood, improve stress/anxiety management, emotion regulation, distress tolerance, and behavior modification.   Diagnosis: Axis I: Major depressive disorder, recurrent, moderate; Generalized anxiety disorder    Axis II: No diagnosis    Rachel Bo Hadja Harral, LCSW 02/16/2020

## 2020-03-12 ENCOUNTER — Telehealth (INDEPENDENT_AMBULATORY_CARE_PROVIDER_SITE_OTHER): Admitting: Psychiatry

## 2020-03-12 ENCOUNTER — Encounter: Payer: Self-pay | Admitting: Psychiatry

## 2020-03-12 ENCOUNTER — Other Ambulatory Visit: Payer: Self-pay

## 2020-03-12 DIAGNOSIS — F3342 Major depressive disorder, recurrent, in full remission: Secondary | ICD-10-CM

## 2020-03-12 DIAGNOSIS — F17200 Nicotine dependence, unspecified, uncomplicated: Secondary | ICD-10-CM | POA: Diagnosis not present

## 2020-03-12 DIAGNOSIS — F411 Generalized anxiety disorder: Secondary | ICD-10-CM | POA: Diagnosis not present

## 2020-03-12 MED ORDER — ESCITALOPRAM OXALATE 20 MG PO TABS: 20.0000 mg | ORAL_TABLET | Freq: Every day | ORAL | 0 refills | Status: DC

## 2020-03-12 NOTE — Progress Notes (Signed)
Virtual Visit via Video Note  I connected with Olivia King on 03/12/20 at  9:00 AM EST by a video enabled telemedicine application and verified that I am speaking with the correct person using two identifiers.  Location Provider Location : [facility] Patient Location : Work  Participants: Patient , Provider   I discussed the limitations of evaluation and management by telemedicine and the availability of in person appointments. The patient expressed understanding and agreed to proceed. I discussed the assessment and treatment plan with the patient. The patient was provided an opportunity to ask questions and all were answered. The patient agreed with the plan and demonstrated an understanding of the instructions.  The patient was advised to call back or seek an in-person evaluation if the symptoms worsen or if the condition fails to improve as anticipated.  [place] MD OP Progress Note  03/12/2020 9:27 AM Cyanna Neace  MRN:  [id number]  Chief Complaint:  Chief Complaint    Follow-up     HPI: Olivia King is a 34 year old female, employed, lives in [place], widowed, has a history of MDD, GAD, tobacco use disorder was evaluated by telemedicine today.  Patient today reports she is currently doing well with regards to her mood.  Denies any sadness or anxiety.  She is compliant on Lexapro.  She denies side effects to Lexapro.  She reports she tried the Wellbutrin for her excessive sleepiness during the day.  However the Wellbutrin made her more sleepy.  She hence stopped taking it.  She currently believes her sleeping during the day could be behavioral since she has control over it when she needs to.  She reports the last week she was busy doing a lot of things and she did not feel sleepy during the day and was able to stay awake.  Patient reports she is currently working with a provider for her weight loss.  She has lost several pounds.  She is currently back on phentermine which is  being prescribed by her provider.  She denies side effects to them.  Patient denies any suicidality, homicidality or perceptual disturbances.  She continues to smoke cigarettes and reports she is interested in cutting back.  She will let writer know if she is interested in Chantix.  Visit Diagnosis:    ICD-10-CM   1. MDD (major depressive disorder), recurrent, in full remission ([place])  F33.42 escitalopram (LEXAPRO) 20 MG tablet  2. GAD (generalized anxiety disorder)  F41.1   3. Tobacco use disorder  F17.200     Past Psychiatric History: I have reviewed past psychiatric history from my progress note on 10/10/2019.  Past trials of medications like Lexapro, Vyvanse, Abilify  Past Medical History:  Past Medical History:  Diagnosis Date  . Cigarette smoker   . COVID-19 virus detected 03/21/2019  . IUD contraception 02/06/2016   Placed January 23, 7168 by Ardeth Perfect, [place]  . Major depressive disorder, recurrent ([place]) 08/19/2015   zoloft made her cry; cymbalta made her too sleepy  . Migraine headache     Past Surgical History:  Procedure Laterality Date  . CESAREAN SECTION  2004 &2012  . contraceptive implant      Family Psychiatric History: I have reviewed family psychiatric history from my progress note on 10/10/2019  Family History:  Family History  Problem Relation Age of Onset  . Hypertension Mother   . Cancer Mother        pancreatic  . Pancreatic cancer Mother   . Hypertension Brother   .  Aneurysm Maternal Grandmother   . Heart attack Maternal Grandfather   . Hypertension Paternal Grandmother   . Heart failure Sister     Social History: Reviewed social history from my progress note on 10/10/2019 Social History   Socioeconomic History  . Marital status: Widowed    Spouse name: Not on file  . Number of children: 2  . Years of education: Not on file  . Highest education level: Associate degree: occupational, Hotel manager, or vocational program   Occupational History  . Occupation: CMA    Comment: [employer]  Tobacco Use  . Smoking status: Current Every Day Smoker    Packs/day: 0.75    Years: 9.00    Pack years: 6.75    Types: Cigarettes    Start date: 03/05/2011  . Smokeless tobacco: Never Used  Vaping Use  . Vaping Use: Never used  Substance and Sexual Activity  . Alcohol use: Yes    Alcohol/week: 0.0 standard drinks  . Drug use: No  . Sexual activity: Yes    Partners: Male    Birth control/protection: I.U.D.    Comment: Patient has liletta IUD  Other Topics Concern  . Not on file  Social History Narrative   She is a widow, he died 08/03/2009 while he was deployed.    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 7867   Social Determinants of Health   Financial Resource Strain: Low Risk   . Difficulty of Paying Living Expenses: Not hard at all  Food Insecurity: No Food Insecurity  . Worried About Charity fundraiser in the Last Year: Never true  . Ran Out of Food in the Last Year: Never true  Transportation Needs: No Transportation Needs  . Lack of Transportation (Medical): No  . Lack of Transportation (Non-Medical): No  Physical Activity: Sufficiently Active  . Days of Exercise per Week: 3 days  . Minutes of Exercise per Session: 50 min  Stress: No Stress Concern Present  . Feeling of Stress : Not at all  Social Connections: Moderately Integrated  . Frequency of Communication with Friends and Family: More than three times a week  . Frequency of Social Gatherings with Friends and Family: More than three times a week  . Attends Religious Services: More than 4 times per year  . Active Member of Clubs or Organizations: Yes  . Attends Archivist Meetings: More than 4 times per year  . Marital Status: Widowed    Allergies:  Allergies  Allergen Reactions  . Imitrex [Sumatriptan] Anaphylaxis    Throat and tongue swelling   . Penicillins Rash  . Trintellix [Vortioxetine]     Tingling hands and blurred vision      Metabolic Disorder Labs: Lab Results  Component Value Date   HGBA1C 5.1 05/10/2019   MPG 100 05/10/2019   MPG 103 03/04/2018   No results found for: PROLACTIN Lab Results  Component Value Date   CHOL 178 04/09/2017   TRIG 94 04/09/2017   HDL 49 (L) 04/09/2017   CHOLHDL 3.6 04/09/2017   VLDL 11 03/19/2016   LDLCALC 110 (H) 04/09/2017   LDLCALC 98 03/19/2016   Lab Results  Component Value Date   TSH 2.17 05/10/2019   TSH 3.10 04/09/2017    Therapeutic Level Labs: No results found for: LITHIUM No results found for: VALPROATE No components found for:  CBMZ  Current Medications: Current Outpatient Medications  Medication Sig Dispense Refill  . fluconazole (DIFLUCAN) 150 MG tablet Take 1 tablet today. If  symptoms still present in 3 days, take second tablet.    . cholecalciferol (VITAMIN D) 1000 units tablet Take 1,000 Units by mouth daily.    [place] Kitchen escitalopram (LEXAPRO) 20 MG tablet Take 1 tablet (20 mg total) by mouth daily. 90 tablet 0  . fluticasone (FLONASE) 50 MCG/ACT nasal spray Place 2 sprays into both nostrils daily. 16 g 2  . hydrOXYzine (ATARAX/VISTARIL) 10 MG tablet Take 1 tablet (10 mg total) by mouth at bedtime as needed. 90 tablet 0  . ibuprofen (ADVIL) 800 MG tablet Take 1 tablet (800 mg total) by mouth every 8 (eight) hours as needed. 90 tablet 1  . ipratropium (ATROVENT) 0.03 % nasal spray ipratropium bromide 21 mcg (0.03 %) nasal spray    . lamoTRIgine (LAMICTAL) 25 MG tablet Take 1 tablet (25 mg total) by mouth daily. 90 tablet 0  . loratadine (CLARITIN) 10 MG tablet Take 1 tablet (10 mg total) by mouth daily. (Patient not taking: Reported on 12/18/2019) 90 tablet 0  . meloxicam (MOBIC) 15 MG tablet TAKE 1 TABLET(15 MG) BY MOUTH DAILY. FOLLOW UP WITH MEDICAL DOCTOR BEFORE FURTHER REFILLS 90 tablet 0  . metroNIDAZOLE (FLAGYL) 500 MG tablet Take 500 mg by mouth 2 (two) times daily.    . Multiple Vitamin (MULTI-VITAMINS) TABS Take by mouth.    [place] Kitchen olopatadine  (PATANOL) 0.1 % ophthalmic solution Place 1 drop into both eyes 2 (two) times daily. 5 mL 2  . phentermine (ADIPEX-P) 37.5 MG tablet Take 37.5 mg by mouth daily.    . potassium chloride SA (KLOR-CON) 20 MEQ tablet Take 1-2 tablets (20-40 mEq total) by mouth daily. 180 tablet 0  . Rimegepant Sulfate (NURTEC) 75 MG TBDP Take 1 tablet by mouth daily as needed. 9 tablet 2  . torsemide (DEMADEX) 20 MG tablet Take 2 tablets (40 mg total) by mouth every other day. 60 tablet 2   No current facility-administered medications for this visit.     Musculoskeletal: Strength & Muscle Tone: UTA Gait & Station: normal Patient leans: N/A  Psychiatric Specialty Exam: Review of Systems  Psychiatric/Behavioral: Positive for sleep disturbance (Excessive at times- improving). Negative for agitation, confusion, decreased concentration, hallucinations, self-injury and suicidal ideas. The patient is not nervous/anxious and is not hyperactive.   All other systems reviewed and are negative.   There were no vitals taken for this visit.There is no height or weight on file to calculate BMI.  General Appearance: Casual  Eye Contact:  Fair  Speech:  Clear and Coherent  Volume:  Normal  Mood:  Euthymic  Affect:  Congruent  Thought Process:  Goal Directed and Descriptions of Associations: Intact  Orientation:  Full (Time, Place, and Person)  Thought Content: Logical   Suicidal Thoughts:  No  Homicidal Thoughts:  No  Memory:  Immediate;   Fair Recent;   Fair Remote;   Fair  Judgement:  Fair  Insight:  Fair  Psychomotor Activity:  Normal  Concentration:  Concentration: Fair and Attention Span: Fair  Recall:  [employer] of Knowledge: Fair  Language: Fair  Akathisia:  No  Handed:  Right  AIMS (if indicated):UTA  Assets:  Communication Skills Desire for Improvement Housing Social Support  ADL's:  Intact  Cognition: WNL  Sleep:  Improving   Screenings: GAD-7     Video Visit from 10/10/2019 in [facility] Office Visit from 06/23/2019 in [facility] Office Visit from 03/29/2019 in [facility] Office Visit from 08/30/2018  in [facility] Office Visit from 07/06/2018 in [facility]  Total GAD-7 Score 10 3 5 1 4     PHQ2-9     Office Visit from 12/18/2019 in [facility] Counselor from 10/19/2019 in [place] Office Visit from 09/15/2019 in [facility] Office Visit from 08/18/2019 in [facility] Office Visit from 06/23/2019 in [facility]  PHQ-2 Total Score 3 1 0 1 0  PHQ-9 Total Score 12 4 0 6 2       Assessment and Plan: Zephaniah Enyeart is a 34 year old female, widowed, lives in [place], employed, has a history of MDD, GAD, eating disorder unspecified, lymphedema, obesity was evaluated by telemedicine today.  Patient is biologically predisposed given her history of trauma, medical problems.  Patient with psychosocial stressors of death of her husband while in [employer], her own history of trauma, pandemic, job related stressors, relationship struggles.  Patient however is currently stable on current medication regimen.  Plan as noted below.  Plan MDD in remission Lexapro 20 mg p.o. daily Discontinue Wellbutrin for side effects Lamictal 25 mg p.o. daily She will work on her sleep hygiene techniques and use behavioral strategies for excessive sleepiness during the day.  She believes it is more like a habit and she is able to control it.  GAD-improving Lexapro as prescribed Continue CBT  Tobacco use disorder-improving Discussed Chantix, she will let writer know if she is interested Provided counseling  Follow-up in clinic in 8 weeks or sooner if needed.   I have spent atleast 20 minutes face to face by video with patient today. More than 50 % of the time was spent for preparing to see  the patient ( e.g., review of test, records ), ordering medications and test ,psychoeducation and supportive psychotherapy and care coordination,as well as documenting clinical information in electronic health record. This note was generated in part or whole with voice recognition software. Voice recognition is usually quite accurate but there are transcription errors that can and very often do occur. I apologize for any typographical errors that were not detected and corrected.       Ursula Alert, MD 03/12/2020, 9:27 AM

## 2020-03-13 ENCOUNTER — Encounter: Payer: Self-pay | Admitting: Family Medicine

## 2020-03-13 ENCOUNTER — Other Ambulatory Visit (HOSPITAL_COMMUNITY)
Admission: RE | Admit: 2020-03-13 | Discharge: 2020-03-13 | Disposition: A | Discharge status: Home / Self Care | Source: Ambulatory Visit | Attending: Family Medicine | Admitting: Family Medicine

## 2020-03-13 ENCOUNTER — Ambulatory Visit (INDEPENDENT_AMBULATORY_CARE_PROVIDER_SITE_OTHER): Admitting: Family Medicine

## 2020-03-13 VITALS — BP 126/88 | HR 100 | Temp 98.9°F | Resp 16 | Ht 63.0 in | Wt 235.0 lb

## 2020-03-13 DIAGNOSIS — Z23 Encounter for immunization: Secondary | ICD-10-CM

## 2020-03-13 DIAGNOSIS — N898 Other specified noninflammatory disorders of vagina: Secondary | ICD-10-CM | POA: Insufficient documentation

## 2020-03-13 LAB — POCT URINALYSIS DIPSTICK
Bilirubin, UA: NEGATIVE
Blood, UA: NEGATIVE
Glucose, UA: NEGATIVE
Ketones, UA: NEGATIVE
Leukocytes, UA: NEGATIVE
Nitrite, UA: NEGATIVE
Protein, UA: NEGATIVE
Spec Grav, UA: 1.02 (ref 1.010–1.025)
Urobilinogen, UA: 0.2 E.U./dL
pH, UA: 6 (ref 5.0–8.0)

## 2020-03-13 MED ORDER — METRONIDAZOLE 0.75 % VA GEL: 1.0000 | VAGINAL | 1 refills | Status: DC

## 2020-03-13 MED ORDER — METRONIDAZOLE 500 MG PO TABS: 500.0000 mg | ORAL_TABLET | Freq: Two times a day (BID) | ORAL | 0 refills | Status: DC

## 2020-03-13 NOTE — Progress Notes (Signed)
Name: Olivia King   MRN: [id number]    DOB: 06-20-1985   Date:03/13/2020       Progress Note  Subjective  Chief Complaint  Acute visit for suspected bacterial infection   HPI  Vaginal odor: she has recurrent BV, sometimes she gets  rx from [place] at work, last treated was one month ago. She has tried boric acid in the past, treated for yeast Saturday pm and developed water discharge with odor two days ago, discussed treated with oral medication followed by metrogel twice a week for 6 months and she would like to try it   She has IUD, she spots intermittently. She would like to be checked for other STI's also but not the blood   Patient Active Problem List   Diagnosis Date Noted  . MDD (major depressive disorder), recurrent episode, moderate ([place]) 02/13/2020  . Tobacco use disorder 02/13/2020  . MDD (major depressive disorder), recurrent episode, mild ([place]) 10/10/2019  . GAD (generalized anxiety disorder) 10/10/2019  . Eating disorder 10/10/2019  . Lymphedema 04/10/2019  . Chronic venous insufficiency 04/10/2019  . Leg pain 04/10/2019  . Bilateral carpal tunnel syndrome 11/04/2018  . Morbid obesity ([place]) 06/14/2017  . Anxiety 03/24/2017  . Contraception management 01/06/2016  . Glucosuria 09/17/2015  . Major depressive disorder, recurrent ([place]) 08/19/2015  . Benign neoplasm of skin of nose 07/28/2015  . Low serum vitamin D 12/24/2014  . Migraine with aura 12/21/2014    Past Surgical History:  Procedure Laterality Date  . CESAREAN SECTION  2004 &2012  . contraceptive implant      Family History  Problem Relation Age of Onset  . Hypertension Mother   . Cancer Mother        pancreatic  . Pancreatic cancer Mother   . Hypertension Brother   . Aneurysm Maternal Grandmother   . Heart attack Maternal Grandfather   . Hypertension Paternal Grandmother   . Heart failure Sister     Social History   Tobacco Use  . Smoking status: Current Every Day Smoker    Packs/day:  0.75    Years: 9.00    Pack years: 6.75    Types: Cigarettes    Start date: 03/05/2011  . Smokeless tobacco: Never Used  Substance Use Topics  . Alcohol use: Yes    Alcohol/week: 0.0 standard drinks     Current Outpatient Medications:  .  cholecalciferol (VITAMIN D) 1000 units tablet, Take 1,000 Units by mouth daily., Disp: , Rfl:  .  escitalopram (LEXAPRO) 20 MG tablet, Take 1 tablet (20 mg total) by mouth daily., Disp: 90 tablet, Rfl: 0 .  fluticasone (FLONASE) 50 MCG/ACT nasal spray, Place 2 sprays into both nostrils daily., Disp: 16 g, Rfl: 2 .  hydrOXYzine (ATARAX/VISTARIL) 10 MG tablet, Take 1 tablet (10 mg total) by mouth at bedtime as needed., Disp: 90 tablet, Rfl: 0 .  ibuprofen (ADVIL) 800 MG tablet, Take 1 tablet (800 mg total) by mouth every 8 (eight) hours as needed., Disp: 90 tablet, Rfl: 1 .  ipratropium (ATROVENT) 0.03 % nasal spray, ipratropium bromide 21 mcg (0.03 %) nasal spray, Disp: , Rfl:  .  lamoTRIgine (LAMICTAL) 25 MG tablet, Take 1 tablet (25 mg total) by mouth daily., Disp: 90 tablet, Rfl: 0 .  Multiple Vitamin (MULTI-VITAMINS) TABS, Take by mouth., Disp: , Rfl:  .  olopatadine (PATANOL) 0.1 % ophthalmic solution, Place 1 drop into both eyes 2 (two) times daily., Disp: 5 mL, Rfl: 2 .  phentermine (ADIPEX-P) 37.5  MG tablet, Take 37.5 mg by mouth daily., Disp: , Rfl:  .  potassium chloride SA (KLOR-CON) 20 MEQ tablet, Take 1-2 tablets (20-40 mEq total) by mouth daily., Disp: 180 tablet, Rfl: 0 .  Rimegepant Sulfate (NURTEC) 75 MG TBDP, Take 1 tablet by mouth daily as needed., Disp: 9 tablet, Rfl: 2 .  torsemide (DEMADEX) 20 MG tablet, Take 2 tablets (40 mg total) by mouth every other day., Disp: 60 tablet, Rfl: 2  Allergies  Allergen Reactions  . Imitrex [Sumatriptan] Anaphylaxis    Throat and tongue swelling   . Penicillins Rash  . Trintellix [Vortioxetine]     Tingling hands and blurred vision     I personally reviewed active problem list, medication  list, allergies, family history, social history, health maintenance with the patient/caregiver today.   ROS  Ten systems reviewed and is negative except as mentioned in HPI   Objective  Vitals:   03/13/20 1440  BP: 126/88  Pulse: 100  Resp: 16  Temp: 98.9 F (37.2 C)  TempSrc: Oral  SpO2: 99%  Weight: 235 lb (106.6 kg)  Height: 5\' 3"  (1.6 m)    Body mass index is 41.63 kg/m.  Physical Exam  Constitutional: Patient appears well-developed and well-nourished. Obese  No distress.  HEENT: head atraumatic, normocephalic, pupils equal and reactive to light,  neck supple Cardiovascular: Normal rate, regular rhythm and normal heart sounds.  No murmur heard. Trace  BLE edema. Pulmonary/Chest: Effort normal and breath sounds normal. No respiratory distress. Abdominal: Soft.  There is no tenderness. Psychiatric: Patient has a normal mood and affect. behavior is normal. Judgment and thought content normal.  PHQ2/9: Depression screen [facility] 2/9 03/13/2020 12/18/2019 09/15/2019 08/18/2019 06/23/2019  Decreased Interest 0 2 0 0 0  Down, Depressed, Hopeless 0 1 0 1 0  PHQ - 2 Score 0 3 0 1 0  Altered sleeping - 2 0 0 0  Tired, decreased energy - 3 - 0 0  Change in appetite - 0 0 0 0  Feeling bad or failure about yourself  - 2 0 3 0  Trouble concentrating - 0 0 2 1  Moving slowly or fidgety/restless - 2 0 0 1  Suicidal thoughts - 0 0 0 0  PHQ-9 Score - 12 0 6 2  Difficult doing work/chores - Very difficult - - Not difficult at all  Some encounter information is confidential and restricted. Go to Review Flowsheets activity to see all data.  Some recent data might be hidden    phq 9 is negative   Fall Risk: Fall Risk  03/13/2020 12/18/2019 06/23/2019 05/10/2019 02/15/2019  Falls in the past year? 0 0 0 0 0  Number falls in past yr: 0 0 0 0 0  Injury with Fall? 0 1 0 0 0     Functional Status Survey: Is the patient deaf or have difficulty hearing?: No Does the patient have difficulty  seeing, even when wearing glasses/contacts?: Yes Does the patient have difficulty concentrating, remembering, or making decisions?: No Does the patient have difficulty walking or climbing stairs?: No Does the patient have difficulty dressing or bathing?: No Does the patient have difficulty doing errands alone such as visiting a doctor's office or shopping?: No    Assessment & Plan  1. Vaginal odor  - POCT Urinalysis Dipstick - Cervicovaginal ancillary only - metroNIDAZOLE (FLAGYL) 500 MG tablet; Take 1 tablet (500 mg total) by mouth 2 (two) times daily.  Dispense: 14 tablet; Refill: 0 - metroNIDAZOLE (  METROGEL VAGINAL) 0.75 % vaginal gel; Place 1 Applicatorful vaginally 2 (two) times a week.  Dispense: 70 g; Refill: 1   2. Need for immunization against influenza  - Flu Vaccine QUAD 36+ mos IM

## 2020-03-15 LAB — CERVICOVAGINAL ANCILLARY ONLY
Bacterial Vaginitis (gardnerella): POSITIVE — AB
Candida Glabrata: NEGATIVE
Candida Vaginitis: NEGATIVE
Chlamydia: NEGATIVE
Comment: NEGATIVE
Comment: NEGATIVE
Comment: NEGATIVE
Comment: NEGATIVE
Comment: NEGATIVE
Comment: NORMAL
Neisseria Gonorrhea: NEGATIVE
Trichomonas: NEGATIVE

## 2020-03-20 ENCOUNTER — Ambulatory Visit (INDEPENDENT_AMBULATORY_CARE_PROVIDER_SITE_OTHER): Admitting: Licensed Clinical Social Worker

## 2020-03-20 ENCOUNTER — Other Ambulatory Visit: Payer: Self-pay

## 2020-03-20 DIAGNOSIS — F3342 Major depressive disorder, recurrent, in full remission: Secondary | ICD-10-CM | POA: Diagnosis not present

## 2020-03-20 NOTE — Progress Notes (Signed)
Virtual Visit via Video Note  I connected with Olivia King on 03/20/20 at  8:00 AM EST by a video enabled telemedicine application and verified that I am speaking with the correct person using two identifiers.  Location: Patient: home Provider: remote office [place], [place])   I discussed the assessment and treatment plan with the patient. The patient was provided an opportunity to ask questions and all were answered. The patient agreed with the plan and demonstrated an understanding of the instructions.   The patient was advised to call back or seek an in-person evaluation if the symptoms worsen or if the condition fails to improve as anticipated.  I provided 20 minutes of non-face-to-face time during this encounter.   Keyarra King R Sakib Noguez, LCSW    THERAPIST PROGRESS NOTE  Session Time: 8-8:20a  Participation Level: Minimal  Behavioral Response: NeatAlertgenerally pleasant, positive  Type of Therapy: Individual Therapy  Treatment Goals addressed: Coping  Interventions: Supportive  Summary: Olivia King is a 34 y.o. female who presents with improving symptoms related to depression/anxiety. Pt reports that overall mood is stable and that she is managing stress and anxiety well.  Pt reports good quality and quantity of sleep.  Allowed pt opportunity to discuss thoughts and feelings associated with recent life stressors--pt reports that things are going well right now with relationships, family, and work. Pt is happy with current job and work hours. Pt reports that she is dedicated to eating healthy and working out and has lost 18 lbs. Pt reports that this is improving self esteem a lot.   Reflected on the year 2021 and allowed pt to view progress--pt feels that she has progressed throughout the year and feels more confident in self and decision-making. Pt feels good about overall life management.   Discussed reviewing personal goals in January at next session.    Encouraged pt to continue focusing on overall self care, healthy choices, positive social supports, and life balance.  Suicidal/Homicidal: No  Therapist Response: Antavia reports that she is making good progress with self understanding and self insight. Pt reports that she is managing weight-loss behaviors and is down by 18 lbs.   Plan: Return again in 6 weeks.  Diagnosis: Axis I: Major Depression, Recurrent, in remission    Axis II: No diagnosis    Olivia Bo Logyn Dedominicis, LCSW 03/20/2020

## 2020-05-10 ENCOUNTER — Other Ambulatory Visit: Payer: Self-pay

## 2020-05-10 ENCOUNTER — Encounter: Payer: Self-pay | Admitting: Psychiatry

## 2020-05-10 ENCOUNTER — Telehealth (INDEPENDENT_AMBULATORY_CARE_PROVIDER_SITE_OTHER): Admitting: Psychiatry

## 2020-05-10 DIAGNOSIS — F411 Generalized anxiety disorder: Secondary | ICD-10-CM

## 2020-05-10 DIAGNOSIS — F17200 Nicotine dependence, unspecified, uncomplicated: Secondary | ICD-10-CM | POA: Diagnosis not present

## 2020-05-10 DIAGNOSIS — F3342 Major depressive disorder, recurrent, in full remission: Secondary | ICD-10-CM | POA: Diagnosis not present

## 2020-05-10 MED ORDER — ESCITALOPRAM OXALATE 20 MG PO TABS: 20.0000 mg | ORAL_TABLET | Freq: Every day | ORAL | 0 refills | Status: DC

## 2020-05-10 MED ORDER — LAMOTRIGINE 25 MG PO TABS: 25.0000 mg | ORAL_TABLET | Freq: Every day | ORAL | 0 refills | Status: DC

## 2020-05-10 NOTE — Progress Notes (Signed)
Virtual Visit via Video Note  I connected with Olivia King on 05/10/20 at 10:20 AM EST by a video enabled telemedicine application and verified that I am speaking with the correct person using two identifiers.  Location Provider Location : [facility] Patient Location : Car  Participants: Patient , Provider   I discussed the limitations of evaluation and management by telemedicine and the availability of in person appointments. The patient expressed understanding and agreed to proceed.     I discussed the assessment and treatment plan with the patient. The patient was provided an opportunity to ask questions and all were answered. The patient agreed with the plan and demonstrated an understanding of the instructions.   The patient was advised to call back or seek an in-person evaluation if the symptoms worsen or if the condition fails to improve as anticipated.  [place] MD OP Progress Note  05/10/2020 10:39 AM Olivia King  MRN:  [id number]  Chief Complaint:  Chief Complaint    Follow-up     HPI: Olivia King is a 35 year old female, employed, lives in [place], widowed, has a history of MDD, GAD, tobacco use disorder was evaluated by telemedicine today.  Patient today reports mood wise she is doing okay.  Denies any sadness, crying spells.  Reports her anxiety symptoms are stable.  She reports sleep as improved at night.  She reports her excessive sleepiness during the day has improved.  She reports she is currently going to the gym and working out regularly 1 to 1-1/2 hours.  She reports that helps her to stay alert and awake during the day and she does not need to take any naps during the day anymore.  She reports she continues to follow-up with the weight loss clinic and has lost several pounds.  She continues to watch her diet.  She is cutting portions and that helps.  She is currently on the phentermine.  Patient denies suicidality, homicidality or perceptual  disturbances.  Patient is cutting back on smoking and reports she has started using vape on and off.  She however is willing to cut back.  Patient denies any other concerns today.    Visit Diagnosis:    ICD-10-CM   1. MDD (major depressive disorder), recurrent, in full remission ([place])  F33.42 escitalopram (LEXAPRO) 20 MG tablet  2. GAD (generalized anxiety disorder)  F41.1 lamoTRIgine (LAMICTAL) 25 MG tablet  3. Tobacco use disorder  F17.200     Past Psychiatric History: I have reviewed past psychiatric history from my progress note on 10/10/2019.  Past trials of medications like Lexapro, Vyvanse, Abilify  Past Medical History:  Past Medical History:  Diagnosis Date   Cigarette smoker    COVID-19 virus detected 03/21/2019   IUD contraception 02/06/2016   Placed October 5, 0000000 by Ardeth Perfect, PA [facility]   Major depressive disorder, recurrent ([place]) 08/19/2015   zoloft made her cry; cymbalta made her too sleepy   Migraine headache     Past Surgical History:  Procedure Laterality Date   CESAREAN SECTION  2004 &2012   contraceptive implant      Family Psychiatric History: I have reviewed family psychiatric history from my progress note on 10/10/2019  Family History:  Family History  Problem Relation Age of Onset   Hypertension Mother    Cancer Mother        pancreatic   Pancreatic cancer Mother    Hypertension Brother    Aneurysm Maternal Grandmother    Heart attack Maternal Grandfather  Hypertension Paternal Grandmother    Heart failure Sister     Social History: Reviewed social history from my progress note on 10/10/2019 Social History   Socioeconomic History   Marital status: Widowed    Spouse name: Not on file   Number of children: 2   Years of education: Not on file   Highest education level: Associate degree: occupational, Hotel manager, or vocational program  Occupational History   Occupation: CMA    Comment: [employer]  Tobacco  Use   Smoking status: Current Every Day Smoker    Years: 9.00    Types: Cigarettes    Start date: 03/05/2011   Smokeless tobacco: Never Used   Tobacco comment: a pack last 2 days now - reported 05/10/2020  Vaping Use   Vaping Use: Some days   Substances: Flavoring  Substance and Sexual Activity   Alcohol use: Yes    Alcohol/week: 0.0 standard drinks   Drug use: No   Sexual activity: Yes    Partners: Male    Birth control/protection: I.U.D.    Comment: Patient has liletta IUD  Other Topics Concern   Not on file  Social History Narrative   She is a widow, he died Aug 20, 2009 while he was deployed.    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 9485   Social Determinants of Health   Financial Resource Strain: Low Risk    Difficulty of Paying Living Expenses: Not hard at all  Food Insecurity: Not on file  Transportation Needs: Not on file  Physical Activity: Sufficiently Active   Days of Exercise per Week: 3 days   Minutes of Exercise per Session: 50 min  Stress: No Stress Concern Present   Feeling of Stress : Not at all  Social Connections: Moderately Integrated   Frequency of Communication with Friends and Family: More than three times a week   Frequency of Social Gatherings with Friends and Family: More than three times a week   Attends Religious Services: More than 4 times per year   Active Member of [employer] or Organizations: Yes   Attends Archivist Meetings: More than 4 times per year   Marital Status: Widowed    Allergies:  Allergies  Allergen Reactions   Imitrex [Sumatriptan] Anaphylaxis    Throat and tongue swelling    Penicillins Rash   Trintellix [Vortioxetine]     Tingling hands and blurred vision     Metabolic Disorder Labs: Lab Results  Component Value Date   HGBA1C 5.1 05/10/2019   MPG 100 05/10/2019   MPG 103 03/04/2018   No results found for: PROLACTIN Lab Results  Component Value Date   CHOL 178 04/09/2017   TRIG 94  04/09/2017   HDL 49 (L) 04/09/2017   CHOLHDL 3.6 04/09/2017   VLDL 11 03/19/2016   LDLCALC 110 (H) 04/09/2017   LDLCALC 98 03/19/2016   Lab Results  Component Value Date   TSH 2.17 05/10/2019   TSH 3.10 04/09/2017    Therapeutic Level Labs: No results found for: LITHIUM No results found for: VALPROATE No components found for:  CBMZ  Current Medications: Current Outpatient Medications  Medication Sig Dispense Refill   cholecalciferol (VITAMIN D) 1000 units tablet Take 1,000 Units by mouth daily.     escitalopram (LEXAPRO) 20 MG tablet Take 1 tablet (20 mg total) by mouth daily. 90 tablet 0   fluticasone (FLONASE) 50 MCG/ACT nasal spray Place 2 sprays into both nostrils daily. 16 g 2   hydrOXYzine (ATARAX/VISTARIL) 10 MG  tablet Take 1 tablet (10 mg total) by mouth at bedtime as needed. 90 tablet 0   ibuprofen (ADVIL) 800 MG tablet Take 1 tablet (800 mg total) by mouth every 8 (eight) hours as needed. 90 tablet 1   ipratropium (ATROVENT) 0.03 % nasal spray ipratropium bromide 21 mcg (0.03 %) nasal spray     lamoTRIgine (LAMICTAL) 25 MG tablet Take 1 tablet (25 mg total) by mouth daily. 90 tablet 0   metroNIDAZOLE (FLAGYL) 500 MG tablet Take 1 tablet (500 mg total) by mouth 2 (two) times daily. 14 tablet 0   metroNIDAZOLE (METROGEL VAGINAL) 0.75 % vaginal gel Place 1 Applicatorful vaginally 2 (two) times a week. 70 g 1   Multiple Vitamin (MULTI-VITAMINS) TABS Take by mouth.     olopatadine (PATANOL) 0.1 % ophthalmic solution Place 1 drop into both eyes 2 (two) times daily. 5 mL 2   phentermine (ADIPEX-P) 37.5 MG tablet Take 37.5 mg by mouth daily.     potassium chloride SA (KLOR-CON) 20 MEQ tablet Take 1-2 tablets (20-40 mEq total) by mouth daily. 180 tablet 0   Rimegepant Sulfate (NURTEC) 75 MG TBDP Take 1 tablet by mouth daily as needed. 9 tablet 2   torsemide (DEMADEX) 20 MG tablet Take 2 tablets (40 mg total) by mouth every other day. 60 tablet 2   No current  facility-administered medications for this visit.     Musculoskeletal: Strength & Muscle Tone: UTA Gait & Station: UTA Patient leans: N/A  Psychiatric Specialty Exam: Review of Systems  Psychiatric/Behavioral: Positive for sleep disturbance. Negative for agitation, behavioral problems, confusion, decreased concentration, dysphoric mood, hallucinations, self-injury and suicidal ideas. The patient is nervous/anxious. The patient is not hyperactive.   All other systems reviewed and are negative.   There were no vitals taken for this visit.There is no height or weight on file to calculate BMI.  General Appearance: Casual  Eye Contact:  Fair  Speech:  Clear and Coherent  Volume:  Normal  Mood:  Anxious improving  Affect:  Congruent  Thought Process:  Goal Directed and Descriptions of Associations: Intact  Orientation:  Full (Time, Place, and Person)  Thought Content: Logical   Suicidal Thoughts:  No  Homicidal Thoughts:  No  Memory:  Immediate;   Fair Recent;   Fair Remote;   Fair  Judgement:  Fair  Insight:  Fair  Psychomotor Activity:  Normal  Concentration:  Concentration: Fair and Attention Span: Fair  Recall:  [employer] of Knowledge: Fair  Language: Fair  Akathisia:  No  Handed:  Right  AIMS (if indicated): UTA  Assets:  Communication Skills Desire for Improvement Housing Social Support  ADL's:  Intact  Cognition: WNL  Sleep:  Fair reports sleepiness during day as improved   Screenings: GAD-7   Flowsheet Row Video Visit from 10/10/2019 in [facility] Office Visit from 06/23/2019 in [facility] Office Visit from 03/29/2019 in [facility] Office Visit from 08/30/2018 in [facility] Office Visit from 07/06/2018 in [facility]  Total GAD-7 Score 10 3 5 1 4     PHQ2-9   [place] Visit from 03/13/2020 in [facility] Office Visit from  12/18/2019 in [facility] Counselor from 10/19/2019 in [place] Office Visit from 09/15/2019 in [facility] Office Visit from 08/18/2019 in [facility]  PHQ-2 Total Score 0 3 1 0 1  PHQ-9 Total  Score -- 12 4 0 6       Assessment and Plan: Olivia King is a 35 year old female, widowed, lives in [place], employed, has a history of MDD, GAD, eating disorder unspecified, lymphedema, obesity was evaluated by telemedicine today.  She is biologically predisposed given her history of trauma, medical problems.  Patient with psychosocial stressors of death of her husband while in the [employer],  history of trauma, pandemic, job related stressors and relationship struggles.  Patient is currently making progress on the current medication regimen.  Plan as noted below.  Plan MDD in remission Lexapro 20 mg p.o. daily Lamictal 25 mg p.o. daily Continue sleep hygiene techniques and behavioral strategies for excessive sleepiness during the day.  GAD-improving Lexapro as prescribed Continue CBT  Tobacco use disorder- improving Discussed Chantix-patient will let writer know when she is interested. She is currently cutting back. Provided counseling  Follow-up in clinic in  8 weeks or sooner if needed.  I have spent atleast 20 minutes face to face by video with patient today. More than 50 % of the time was spent for preparing to see the patient ( e.g., review of test, records ),  ordering medications and test ,psychoeducation and supportive psychotherapy and care coordination,as well as documenting clinical information in electronic health record. This note was generated in part or whole with voice recognition software. Voice recognition is usually quite accurate but there are transcription errors that can and very often do occur. I apologize for any typographical errors that were not detected and  corrected.      Ursula Alert, MD 05/10/2020, 10:39 AM

## 2020-05-14 ENCOUNTER — Other Ambulatory Visit: Payer: Self-pay

## 2020-05-14 ENCOUNTER — Ambulatory Visit (INDEPENDENT_AMBULATORY_CARE_PROVIDER_SITE_OTHER): Admitting: Licensed Clinical Social Worker

## 2020-05-14 DIAGNOSIS — F3342 Major depressive disorder, recurrent, in full remission: Secondary | ICD-10-CM | POA: Diagnosis not present

## 2020-05-14 DIAGNOSIS — F411 Generalized anxiety disorder: Secondary | ICD-10-CM

## 2020-05-14 NOTE — Progress Notes (Signed)
Virtual Visit via Video Note  I connected with Olivia King on 05/14/20 at  8:00 AM EST by a video enabled telemedicine application and verified that I am speaking with the correct person using two identifiers.  Location: Patient: home Provider: remote office [place], [place])   I discussed the limitations of evaluation and management by telemedicine and the availability of in person appointments. The patient expressed understanding and agreed to proceed.  I discussed the assessment and treatment plan with the patient. The patient was provided an opportunity to ask questions and all were answered. The patient agreed with the plan and demonstrated an understanding of the instructions.   The patient was advised to call back or seek an in-person evaluation if the symptoms worsen or if the condition fails to improve as anticipated.  I provided 20 minutes of non-face-to-face time during this encounter.   Clete Kuch R Ryota Treece, LCSW   THERAPIST PROGRESS NOTE  Session Time: 8-8:20a Participation Level: Active  Behavioral Response: Neat and Well GroomedAlertEuthymic  Type of Therapy: Individual Therapy  Treatment Goals addressed: Coping  Interventions: Supportive  Summary: Olivia King is a 35 y.o. female who presents with improved symptoms related to depression/anxiety diagnoses. Pt feels that she has been managing stress/anxiety and mood well for the past few months and would like to return for counseling PRN. Counselor agrees that Jeanelle is doing well. Discussed initial treatment goals, progress, and patient will call back to set up new treatment goals in the future prn.  Suicidal/Homicidal: No  Therapist Response: Plan: Return again PRN  Diagnosis: Axis I: Generalized Anxiety Disorder and Major Depression, Recurrent in full remission    Axis II: No diagnosis  GAD 7 : Generalized Anxiety Score 05/14/2020 10/10/2019 06/23/2019 03/29/2019  Nervous, Anxious, on Edge 1 0 0 0   Control/stop worrying 2 1 0 0  Worry too much - different things 2 1 0 1  Trouble relaxing 0 3 1 0  Restless 0 3 1 3   Easily annoyed or irritable 1 0 1 0  Afraid - awful might happen 0 2 0 1  Total GAD 7 Score 6 10 3 5   Anxiety Difficulty Not difficult at all Somewhat difficult Not difficult at all Extremely difficult    Depression screen [facility] 2/9 05/14/2020 03/13/2020 12/18/2019 10/19/2019 09/15/2019  Decreased Interest 0 0 2 1 0  Down, Depressed, Hopeless 0 0 1 0 0  PHQ - 2 Score 0 0 3 1 0  Altered sleeping 0 - 2 0 0  Tired, decreased energy 0 - 3 1 -  Change in appetite 0 - 0 1 0  Feeling bad or failure about yourself  0 - 2 0 0  Trouble concentrating 0 - 0 1 0  Moving slowly or fidgety/restless 0 - 2 0 0  Suicidal thoughts 0 - 0 0 0  PHQ-9 Score 0 - 12 4 0  Difficult doing work/chores - - Very difficult - -  Some recent data might be hidden     Rachel Bo Reynalda Canny, LCSW 05/14/2020

## 2020-06-13 NOTE — Progress Notes (Signed)
Name: Olivia King   MRN: [id number]    DOB: [birth year]   Date:06/17/2020       Progress Note  Subjective  Chief Complaint  Follow up   HPI   Lymphedema: she went to vascular surgeon, and was advised to use compression stocking hoses and because of sob advised to see cardiologist. She saw Cardiologist - Dr Garen Lah   and was given Demadex to take one to two daily, cramps resolved with potassium supplementation She skipped taking it lately because she worked extra days last week and legs are more swollen today, she will take Demadex today   Morbid obesity: she states as a teenager she was in the 160 lbs range, but after her son was born in 2004 her weight stayed between 189-203 lbs , had her daughter in 2012 her weight went up to 270 lbs, but after her birth she was able to go back down to 203 lbs. She states in 2016 she took some otc diet pills and was going to the gym and her weight dropped about 40 lbs in a few months, she was back to 165 lbs. She told me back in April 2021 that  she can binge eat. She is able to avoid eating during the day - because she is so busy, but at night when upset with her children or not feeling well she eats to get comfort She states she feels out of control and is unable to stop eating, about twice a week, she feels very guilty afterwards. She states when she overeats she takes magnesium to help her have a bowel movement. She does not make herself vomit We started her on Vyvanse 07/2019 she has noticed that her mood improved and also decrease her urge to eat as much.  She has been off Vyvanse for a couple of months now because of cost, not covered by insurance. [facility] and has been taking phentermine since 05/28/2020   History of covid:she can finally smell ( back for the past two weeks), never lost sense of taste  MDD/GAD/eating disorder: she is under the care of Dr. Cherlyn Cushing and therapist Hussami, she is feeling tired again, lack  of motivation, feels anxious. She is compliant with medication, feeling much worse since out of Vyvanse - insurance does not cover.   Migraine headache:She is allergic to Imitrex, takes Excedrin migraine or Fioricet. She states migraine is described  As unilateral, right or left side, however this episode is a little different , not associated with aura, and pain is behind her right eye not above. No focal deficit, she has not tried Nurtec yet because of afraid of side effects. Discussed Ajovy in our office today and she would like to try. 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   Recurrent BV: was treated by clindamycin, symptoms resolved, but had recurrence after she had intercourse again, same partner, advised to get him treated.   Patient Active Problem List   Diagnosis Date Noted  . MDD (major depressive disorder), recurrent episode, moderate ([place]) 02/13/2020  . Tobacco use disorder 02/13/2020  . MDD (major depressive disorder), recurrent episode, mild ([place]) 10/10/2019  . GAD (generalized anxiety disorder) 10/10/2019  . Eating disorder 10/10/2019  . Lymphedema 04/10/2019  . Chronic venous insufficiency 04/10/2019  . Leg pain 04/10/2019  . Bilateral carpal tunnel syndrome 11/04/2018  . Morbid obesity ([place]) 06/14/2017  . Anxiety 03/24/2017  . Contraception  management 01/06/2016  . Glucosuria 09/17/2015  . Major depressive disorder, recurrent ([place]) 08/19/2015  . Benign neoplasm of skin of nose 07/28/2015  . Low serum vitamin D 12/24/2014  . Migraine with aura 12/21/2014    Past Surgical History:  Procedure Laterality Date  . CESAREAN SECTION  2004 &2012  . contraceptive implant      Family History  Problem Relation Age of Onset  . Hypertension Mother   . Cancer Mother        pancreatic  . Pancreatic cancer Mother   . Hypertension Brother   . Aneurysm Maternal Grandmother   . Heart attack Maternal Grandfather   . Hypertension  Paternal Grandmother   . Heart failure Sister     Social History   Tobacco Use  . Smoking status: Current Every Day Smoker    Years: 9.00    Types: Cigarettes    Start date: 03/05/2011  . Smokeless tobacco: Never Used  . Tobacco comment: a pack last 2 days now - reported 05/10/2020  Substance Use Topics  . Alcohol use: Yes    Alcohol/week: 0.0 standard drinks     Current Outpatient Medications:  .  cholecalciferol (VITAMIN D) 1000 units tablet, Take 1,000 Units by mouth daily., Disp: , Rfl:  .  escitalopram (LEXAPRO) 20 MG tablet, Take 1 tablet (20 mg total) by mouth daily., Disp: 90 tablet, Rfl: 0 .  fluticasone (FLONASE) 50 MCG/ACT nasal spray, Place 2 sprays into both nostrils daily., Disp: 16 g, Rfl: 2 .  hydrOXYzine (ATARAX/VISTARIL) 10 MG tablet, Take 1 tablet (10 mg total) by mouth at bedtime as needed., Disp: 90 tablet, Rfl: 0 .  ibuprofen (ADVIL) 800 MG tablet, Take 1 tablet (800 mg total) by mouth every 8 (eight) hours as needed., Disp: 90 tablet, Rfl: 1 .  ipratropium (ATROVENT) 0.03 % nasal spray, ipratropium bromide 21 mcg (0.03 %) nasal spray, Disp: , Rfl:  .  lamoTRIgine (LAMICTAL) 25 MG tablet, Take 1 tablet (25 mg total) by mouth daily., Disp: 90 tablet, Rfl: 0 .  metroNIDAZOLE (FLAGYL) 500 MG tablet, Take 1 tablet (500 mg total) by mouth 2 (two) times daily., Disp: 14 tablet, Rfl: 0 .  Multiple Vitamin (MULTI-VITAMINS) TABS, Take by mouth., Disp: , Rfl:  .  olopatadine (PATANOL) 0.1 % ophthalmic solution, Place 1 drop into both eyes 2 (two) times daily., Disp: 5 mL, Rfl: 2 .  phentermine (ADIPEX-P) 37.5 MG tablet, Take 37.5 mg by mouth daily., Disp: , Rfl:  .  potassium chloride SA (KLOR-CON) 20 MEQ tablet, Take 1-2 tablets (20-40 mEq total) by mouth daily., Disp: 180 tablet, Rfl: 0 .  Rimegepant Sulfate (NURTEC) 75 MG TBDP, Take 1 tablet by mouth daily as needed., Disp: 9 tablet, Rfl: 2 .  torsemide (DEMADEX) 20 MG tablet, Take 2 tablets (40 mg total) by mouth every  other day., Disp: 60 tablet, Rfl: 2  Allergies  Allergen Reactions  . Imitrex [Sumatriptan] Anaphylaxis    Throat and tongue swelling   . Penicillins Rash  . Trintellix [Vortioxetine]     Tingling hands and blurred vision     I personally reviewed active problem list, medication list, allergies, family history, social history, health maintenance with the patient/caregiver today.   ROS  Constitutional: Negative for fever or weight change.  Respiratory: Negative for cough and shortness of breath.   Cardiovascular: Negative for chest pain or palpitations.  Gastrointestinal: Negative for abdominal pain, no bowel changes.  Musculoskeletal: Negative for gait problem or joint swelling.  Skin: Negative for rash.  Neurological: Negative for dizziness or headache.  No other specific complaints in a complete review of systems (except as listed in HPI above).  Objective  Vitals:   06/17/20 0945  BP: 138/86  Pulse: 100  Resp: 17  Temp: 98.2 F (36.8 C)  TempSrc: Oral  SpO2: 95%  Weight: 238 lb (108 kg)  Height: 5\' 3"  (1.6 m)    Body mass index is 42.16 kg/m.  Physical Exam  Constitutional: Patient appears well-developed and well-nourished. Obese  No distress.  HEENT: head atraumatic, normocephalic, pupils equal and reactive to light,  neck supple Cardiovascular: Normal rate, regular rhythm and normal heart sounds.  No murmur heard. No BLE edema. Pulmonary/Chest: Effort normal and breath sounds normal. No respiratory distress. Abdominal: Soft.  There is no tenderness. Neurological: negative for Romberg. Cranial nerves intake  Psychiatric: Patient has a normal mood and affect. behavior is normal. Judgment and thought content normal.  PHQ2/9: Depression screen [facility] 2/9 06/17/2020 03/13/2020 12/18/2019 09/15/2019 08/18/2019  Decreased Interest 1 0 2 0 0  Down, Depressed, Hopeless 0 0 1 0 1  PHQ - 2 Score 1 0 3 0 1  Altered sleeping 0 - 2 0 0  Tired, decreased energy 3 - 3 - 0   Change in appetite 0 - 0 0 0  Feeling bad or failure about yourself  0 - 2 0 3  Trouble concentrating 0 - 0 0 2  Moving slowly or fidgety/restless 0 - 2 0 0  Suicidal thoughts 0 - 0 0 0  PHQ-9 Score 4 - 12 0 6  Difficult doing work/chores - - Very difficult - -  Some encounter information is confidential and restricted. Go to Review Flowsheets activity to see all data.  Some recent data might be hidden    phq 9 is positive   Fall Risk: Fall Risk  06/17/2020 03/13/2020 12/18/2019 06/23/2019 05/10/2019  Falls in the past year? 0 0 0 0 0  Number falls in past yr: 0 0 0 0 0  Injury with Fall? 0 0 1 0 0     Functional Status Survey: Is the patient deaf or have difficulty hearing?: Yes Does the patient have difficulty seeing, even when wearing glasses/contacts?: Yes Does the patient have difficulty concentrating, remembering, or making decisions?: No Does the patient have difficulty walking or climbing stairs?: No Does the patient have difficulty dressing or bathing?: No Does the patient have difficulty doing errands alone such as visiting a doctor's office or shopping?: No   Assessment & Plan  1. Migraine without aura and without status migrainosus, not intractable  - Fremanezumab-vfrm SOSY 225 mg  2. Morbid obesity ([place])  Discussed with the patient the risk posed by an increased BMI. Discussed importance of portion control, calorie counting and at least 150 minutes of physical activity weekly. Avoid sweet beverages and drink more water. Eat at least 6 servings of fruit and vegetables daily   3. Lymphedema  Stable   4. Recurrent major depressive disorder, in partial remission ([place])   5. GAD (generalized anxiety disorder)   6. Seasonal allergic rhinitis due to pollen   7. Bacterial vaginosis  She would like topical medication,  I will write in on rx pad, not on epic , partner needs to be treated

## 2020-06-17 ENCOUNTER — Encounter: Payer: Self-pay | Admitting: Family Medicine

## 2020-06-17 ENCOUNTER — Ambulatory Visit (INDEPENDENT_AMBULATORY_CARE_PROVIDER_SITE_OTHER): Admitting: Family Medicine

## 2020-06-17 ENCOUNTER — Other Ambulatory Visit: Payer: Self-pay

## 2020-06-17 VITALS — BP 138/86 | HR 100 | Temp 98.2°F | Resp 17 | Ht 63.0 in | Wt 238.0 lb

## 2020-06-17 DIAGNOSIS — I890 Lymphedema, not elsewhere classified: Secondary | ICD-10-CM

## 2020-06-17 DIAGNOSIS — J301 Allergic rhinitis due to pollen: Secondary | ICD-10-CM

## 2020-06-17 DIAGNOSIS — G43009 Migraine without aura, not intractable, without status migrainosus: Secondary | ICD-10-CM | POA: Diagnosis not present

## 2020-06-17 DIAGNOSIS — F411 Generalized anxiety disorder: Secondary | ICD-10-CM

## 2020-06-17 DIAGNOSIS — F3341 Major depressive disorder, recurrent, in partial remission: Secondary | ICD-10-CM

## 2020-06-17 DIAGNOSIS — B9689 Other specified bacterial agents as the cause of diseases classified elsewhere: Secondary | ICD-10-CM

## 2020-06-17 DIAGNOSIS — N760 Acute vaginitis: Secondary | ICD-10-CM

## 2020-06-17 MED ORDER — FREMANEZUMAB-VFRM 225 MG/1.5ML ~~LOC~~ SOSY
225.0000 mg | PREFILLED_SYRINGE | Freq: Once | SUBCUTANEOUS | Status: AC
  Administered 2020-06-17: 225 mg via SUBCUTANEOUS

## 2020-06-24 ENCOUNTER — Other Ambulatory Visit: Payer: Self-pay | Admitting: *Deleted

## 2020-06-24 MED ORDER — TORSEMIDE 20 MG PO TABS: 40.0000 mg | ORAL_TABLET | ORAL | 0 refills | Status: DC

## 2020-07-09 ENCOUNTER — Other Ambulatory Visit: Payer: Self-pay | Admitting: Family Medicine

## 2020-07-09 DIAGNOSIS — J301 Allergic rhinitis due to pollen: Secondary | ICD-10-CM

## 2020-07-09 NOTE — Telephone Encounter (Signed)
   Notes to clinic:  this script has expired  Review for continued use and new script    Requested Prescriptions  Pending Prescriptions Disp Refills   olopatadine (PATANOL) 0.1 % ophthalmic solution [Pharmacy Med Name: OLOPATADINE 0.1% OPTH SOLN 5ML] 5 mL 2    Sig: INSTILL 1 DROP IN BOTH EYES TWICE DAILY      Ophthalmology:  Antiallergy Passed - 07/09/2020  9:50 AM      Passed - Valid encounter within last 12 months    Recent Outpatient Visits           3 weeks ago Migraine without aura and without status migrainosus, not intractable   [facility] Steele Sizer, MD   3 months ago Vaginal odor   [facility], Drue Stager, MD   6 months ago Moderate episode of recurrent major depressive disorder [facility])   [facility] Steele Sizer, MD   9 months ago Bilateral lower extremity edema   [facility] Steele Sizer, MD   10 months ago Morbid obesity [facility])   [facility] Steele Sizer, MD       Future Appointments             In 5 months Ancil Boozer, Drue Stager, MD [facility], [facility]

## 2020-07-15 ENCOUNTER — Encounter: Payer: Self-pay | Admitting: Psychiatry

## 2020-07-15 ENCOUNTER — Telehealth (INDEPENDENT_AMBULATORY_CARE_PROVIDER_SITE_OTHER): Admitting: Psychiatry

## 2020-07-15 ENCOUNTER — Other Ambulatory Visit: Payer: Self-pay

## 2020-07-15 DIAGNOSIS — F411 Generalized anxiety disorder: Secondary | ICD-10-CM

## 2020-07-15 DIAGNOSIS — F17200 Nicotine dependence, unspecified, uncomplicated: Secondary | ICD-10-CM

## 2020-07-15 DIAGNOSIS — F331 Major depressive disorder, recurrent, moderate: Secondary | ICD-10-CM

## 2020-07-15 DIAGNOSIS — F3342 Major depressive disorder, recurrent, in full remission: Secondary | ICD-10-CM | POA: Insufficient documentation

## 2020-07-15 MED ORDER — VENLAFAXINE HCL ER 37.5 MG PO CP24: 37.5000 mg | ORAL_CAPSULE | Freq: Every day | ORAL | 0 refills | Status: DC

## 2020-07-15 MED ORDER — ESCITALOPRAM OXALATE 20 MG PO TABS: 10.0000 mg | ORAL_TABLET | Freq: Every day | ORAL | 0 refills | Status: DC

## 2020-07-15 NOTE — Patient Instructions (Signed)
Venlafaxine Extended-Release Capsules What is this medicine? VENLAFAXINE(VEN la fax een) is used to treat depression, anxiety and panic disorder. This medicine may be used for other purposes; ask your health care provider or pharmacist if you have questions. COMMON BRAND NAME(S): Effexor XR What should I tell my health care provider before I take this medicine? They need to know if you have any of these conditions:  bleeding disorders  glaucoma  heart disease  high blood pressure  high cholesterol  kidney disease  liver disease  low levels of sodium in the blood  mania or bipolar disorder  seizures  suicidal thoughts, plans, or attempt; a previous suicide attempt by you or a family  take medicines that treat or prevent blood clots  thyroid disease  an unusual or allergic reaction to venlafaxine, desvenlafaxine, other medicines, foods, dyes, or preservatives  pregnant or trying to get pregnant  breast-feeding How should I use this medicine? Take this medicine by mouth with a full glass of water. Follow the directions on the prescription label. Do not cut, crush, or chew this medicine. Take it with food. If needed, the capsule may be carefully opened and the entire contents sprinkled on a spoonful of cool applesauce. Swallow the applesauce/pellet mixture right away without chewing and follow with a glass of water to ensure complete swallowing of the pellets. Try to take your medicine at about the same time each day. Do not take your medicine more often than directed. Do not stop taking this medicine suddenly except upon the advice of your doctor. Stopping this medicine too quickly may cause serious side effects or your condition may worsen. A special MedGuide will be given to you by the pharmacist with each prescription and refill. Be sure to read this information carefully each time. Talk to your pediatrician regarding the use of this medicine in children. Special care may be  needed. Overdosage: If you think you have taken too much of this medicine contact a poison control center or emergency room at once. NOTE: This medicine is only for you. Do not share this medicine with others. What if I miss a dose? If you miss a dose, take it as soon as you can. If it is almost time for your next dose, take only that dose. Do not take double or extra doses. What may interact with this medicine? Do not take this medicine with any of the following medications:  certain medicines for fungal infections like fluconazole, itraconazole, ketoconazole, posaconazole, voriconazole  cisapride  desvenlafaxine  dronedarone  duloxetine  levomilnacipran  linezolid  MAOIs like Carbex, Eldepryl, Marplan, Nardil, and Parnate  methylene blue (injected into a vein)  milnacipran  pimozide  thioridazine This medicine may also interact with the following medications:  amphetamines  aspirin and aspirin-like medicines  certain medicines for depression, anxiety, or psychotic disturbances  certain medicines for migraine headaches like almotriptan, eletriptan, frovatriptan, naratriptan, rizatriptan, sumatriptan, zolmitriptan  certain medicines for sleep  certain medicines that treat or prevent blood clots like dalteparin, enoxaparin, warfarin  cimetidine  clozapine  diuretics  fentanyl  furazolidone  indinavir  isoniazid  lithium  metoprolol  NSAIDS, medicines for pain and inflammation, like ibuprofen or naproxen  other medicines that prolong the QT interval (cause an abnormal heart rhythm) like dofetilide, ziprasidone  procarbazine  rasagiline  supplements like St. John's wort, kava kava, valerian  tramadol  tryptophan This list may not describe all possible interactions. Give your health care provider a list of all the medicines,   herbs, non-prescription drugs, or dietary supplements you use. Also tell them if you smoke, drink alcohol, or use illegal  drugs. Some items may interact with your medicine. What should I watch for while using this medicine? Tell your doctor if your symptoms do not get better or if they get worse. Visit your doctor or health care professional for regular checks on your progress. Because it may take several weeks to see the full effects of this medicine, it is important to continue your treatment as prescribed by your doctor. Patients and their families should watch out for new or worsening thoughts of suicide or depression. Also watch out for sudden changes in feelings such as feeling anxious, agitated, panicky, irritable, hostile, aggressive, impulsive, severely restless, overly excited and hyperactive, or not being able to sleep. If this happens, especially at the beginning of treatment or after a change in dose, call your health care professional. This medicine can cause an increase in blood pressure. Check with your doctor for instructions on monitoring your blood pressure while taking this medicine. You may get drowsy or dizzy. Do not drive, use machinery, or do anything that needs mental alertness until you know how this medicine affects you. Do not stand or sit up quickly, especially if you are an older patient. This reduces the risk of dizzy or fainting spells. Alcohol may interfere with the effect of this medicine. Avoid alcoholic drinks. Your mouth may get dry. Chewing sugarless gum, sucking hard candy and drinking plenty of water will help. Contact your doctor if the problem does not go away or is severe. What side effects may I notice from receiving this medicine? Side effects that you should report to your doctor or health care professional as soon as possible:  allergic reactions like skin rash, itching or hives, swelling of the face, lips, or tongue  anxious  breathing problems  confusion  changes in vision  chest pain  confusion  elevated mood, decreased need for sleep, racing thoughts, impulsive  behavior  eye pain  fast, irregular heartbeat  feeling faint or lightheaded, falls  feeling agitated, angry, or irritable  hallucination, loss of contact with reality  high blood pressure  loss of balance or coordination  palpitations  redness, blistering, peeling or loosening of the skin, including inside the mouth  restlessness, pacing, inability to keep still  seizures  stiff muscles  suicidal thoughts or other mood changes  trouble passing urine or change in the amount of urine  trouble sleeping  unusual bleeding or bruising  unusually weak or tired  vomiting Side effects that usually do not require medical attention (report to your doctor or health care professional if they continue or are bothersome):  change in sex drive or performance  change in appetite or weight  constipation  dizziness  dry mouth  headache  increased sweating  nausea  tired This list may not describe all possible side effects. Call your doctor for medical advice about side effects. You may report side effects to FDA at 1-800-FDA-1088. Where should I keep my medicine? Keep out of the reach of children. Store at a controlled temperature between 20 and 25 degrees C (68 degrees and 77 degrees F), in a dry place. Throw away any unused medicine after the expiration date. NOTE: This sheet is a summary. It may not cover all possible information. If you have questions about this medicine, talk to your doctor, pharmacist, or health care provider.  2021 Elsevier/Gold Standard (2020-02-26 14:56:43)  

## 2020-07-15 NOTE — Progress Notes (Signed)
Virtual Visit via Video Note  I connected with Olivia King on 07/15/20 at 10:20 AM EDT by a video enabled telemedicine application and verified that I am speaking with the correct person using two identifiers.  Location Provider Location : [facility] Patient Location : Home  Participants: Patient , Provider    I discussed the limitations of evaluation and management by telemedicine and the availability of in person appointments. The patient expressed understanding and agreed to proceed.    I discussed the assessment and treatment plan with the patient. The patient was provided an opportunity to ask questions and all were answered. The patient agreed with the plan and demonstrated an understanding of the instructions.   The patient was advised to call back or seek an in-person evaluation if the symptoms worsen or if the condition fails to improve as anticipated.   [place] MD OP Progress Note  07/15/2020 6:21 PM Olivia King  MRN:  [id number]  Chief Complaint:  Chief Complaint    Follow-up; Depression; Anxiety     HPI: Olivia King is a 35 year old female, employed, lives in [place], widowed, has a history of MDD, GAD, tobacco use disorder was evaluated by telemedicine today.  Patient today reports she has been feeling overwhelmed.  This has been getting worse since the past 2 weeks.  She reports anxiety symptoms as getting worse.  She is often anxious, worries all the time.  She also reports worsening sadness, low motivation, low energy.  This has been getting worse.  Patient reports sleep as affected since her 11-year-old daughter has sleep terrors.  She wakes up in the middle of the night almost every night and this does have an impact on her sleep as well.  She  feels tired and fatigued during the day.  Patient reports she is compliant on the medications as prescribed.  Patient reports she has been noncompliant with psychotherapy sessions since she does not feel the current  therapist as helpful.  She agrees to find another therapist.  She agrees to do that and Biochemist, clinical know.  Patient reports she is no longer taking the phentermine.  She is making healthy choices with her food.  Patient denies any suicidality, homicidality or perceptual disturbances.  Patient denies any other concerns today.  Visit Diagnosis:    ICD-10-CM   1. MDD (major depressive disorder), recurrent episode, moderate (HCC)  F33.1 venlafaxine XR (EFFEXOR-XR) 37.5 MG 24 hr capsule  2. GAD (generalized anxiety disorder)  F41.1 escitalopram (LEXAPRO) 20 MG tablet  3. Tobacco use disorder  F17.200     Past Psychiatric History: I have reviewed past psychiatric history from my progress note on 10/10/2019.  Past trials of Lexapro, Vyvanse, Abilify,paxil,zoloft,cymbalta.  Past Medical History:  Past Medical History:  Diagnosis Date  . Cigarette smoker   . COVID-19 virus detected 03/21/2019  . IUD contraception 02/06/2016   Placed January 23, 8412 by Ardeth Perfect, [place]  . Major depressive disorder, recurrent ([place]) 08/19/2015   zoloft made her cry; cymbalta made her too sleepy  . Migraine headache     Past Surgical History:  Procedure Laterality Date  . CESAREAN SECTION  2004 &2012  . contraceptive implant      Family Psychiatric History: I have reviewed family psychiatric history from my progress note on 10/10/2019  Family History:  Family History  Problem Relation Age of Onset  . Hypertension Mother   . Cancer Mother        pancreatic  . Pancreatic cancer Mother   .  Hypertension Brother   . Aneurysm Maternal Grandmother   . Heart attack Maternal Grandfather   . Hypertension Paternal Grandmother   . Heart failure Sister     Social History: Reviewed social history from my progress note on 10/10/2019 Social History   Socioeconomic History  . Marital status: Widowed    Spouse name: Not on file  . Number of children: 2  . Years of education: Not on file  .  Highest education level: Associate degree: occupational, Hotel manager, or vocational program  Occupational History  . Occupation: CMA    Comment: [employer]  Tobacco Use  . Smoking status: Current Every Day Smoker    Years: 9.00    Types: Cigarettes    Start date: 03/05/2011  . Smokeless tobacco: Never Used  . Tobacco comment: a pack last 2 days now - reported 05/10/2020  Vaping Use  . Vaping Use: Some days  . Substances: Flavoring  Substance and Sexual Activity  . Alcohol use: Yes    Alcohol/week: 0.0 standard drinks  . Drug use: No  . Sexual activity: Yes    Partners: Male    Birth control/protection: I.U.D.    Comment: Patient has liletta IUD  Other Topics Concern  . Not on file  Social History Narrative   She is a widow, he died 2009/07/19 while he was deployed.    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 3614   Social Determinants of Health   Financial Resource Strain: Low Risk   . Difficulty of Paying Living Expenses: Not hard at all  Food Insecurity: Not on file  Transportation Needs: Not on file  Physical Activity: Sufficiently Active  . Days of Exercise per Week: 3 days  . Minutes of Exercise per Session: 50 min  Stress: No Stress Concern Present  . Feeling of Stress : Not at all  Social Connections: Moderately Integrated  . Frequency of Communication with Friends and Family: More than three times a week  . Frequency of Social Gatherings with Friends and Family: More than three times a week  . Attends Religious Services: More than 4 times per year  . Active Member of Clubs or Organizations: Yes  . Attends Archivist Meetings: More than 4 times per year  . Marital Status: Widowed    Allergies:  Allergies  Allergen Reactions  . Imitrex [Sumatriptan] Anaphylaxis    Throat and tongue swelling   . Penicillins Rash  . Trintellix [Vortioxetine]     Tingling hands and blurred vision     Metabolic Disorder Labs: Lab Results  Component Value Date   HGBA1C 5.1  05/10/2019   MPG 100 05/10/2019   MPG 103 03/04/2018   No results found for: PROLACTIN Lab Results  Component Value Date   CHOL 178 04/09/2017   TRIG 94 04/09/2017   HDL 49 (L) 04/09/2017   CHOLHDL 3.6 04/09/2017   VLDL 11 03/19/2016   LDLCALC 110 (H) 04/09/2017   LDLCALC 98 03/19/2016   Lab Results  Component Value Date   TSH 2.17 05/10/2019   TSH 3.10 04/09/2017    Therapeutic Level Labs: No results found for: LITHIUM No results found for: VALPROATE No components found for:  CBMZ  Current Medications: Current Outpatient Medications  Medication Sig Dispense Refill  . clindamycin (CLEOCIN) 300 MG capsule     . venlafaxine XR (EFFEXOR-XR) 37.5 MG 24 hr capsule Take 1 capsule (37.5 mg total) by mouth daily with breakfast. 30 capsule 0  . cholecalciferol (VITAMIN D) 1000 units  tablet Take 1,000 Units by mouth daily.    Marland Kitchen escitalopram (LEXAPRO) 20 MG tablet Take 0.5 tablets (10 mg total) by mouth daily. Take 10 mg for 3 days and then 10 mg every other day for 3 days and stop 90 tablet 0  . fluticasone (FLONASE) 50 MCG/ACT nasal spray Place 2 sprays into both nostrils daily. 16 g 2  . hydrOXYzine (ATARAX/VISTARIL) 10 MG tablet Take 1 tablet (10 mg total) by mouth at bedtime as needed. 90 tablet 0  . ipratropium (ATROVENT) 0.03 % nasal spray ipratropium bromide 21 mcg (0.03 %) nasal spray    . lamoTRIgine (LAMICTAL) 25 MG tablet Take 1 tablet (25 mg total) by mouth daily. 90 tablet 0  . Multiple Vitamin (MULTI-VITAMINS) TABS Take by mouth.    Marland Kitchen olopatadine (PATANOL) 0.1 % ophthalmic solution INSTILL 1 DROP IN BOTH EYES TWICE DAILY 5 mL 2  . potassium chloride SA (KLOR-CON) 20 MEQ tablet Take 1-2 tablets (20-40 mEq total) by mouth daily. 180 tablet 0  . Rimegepant Sulfate (NURTEC) 75 MG TBDP Take 1 tablet by mouth daily as needed. 9 tablet 2  . torsemide (DEMADEX) 20 MG tablet Take 2 tablets (40 mg total) by mouth every other day. PLEASE CALL OFFICE TO SCHEDULE AN APPOINTMENT FOR  FURTHER REFILLS. 60 tablet 0   No current facility-administered medications for this visit.     Musculoskeletal: Strength & Muscle Tone: UTA Gait & Station: UTA Patient leans: N/A  Psychiatric Specialty Exam: Review of Systems  Psychiatric/Behavioral: Positive for decreased concentration, dysphoric mood and sleep disturbance. The patient is nervous/anxious.   All other systems reviewed and are negative.   There were no vitals taken for this visit.There is no height or weight on file to calculate BMI.  General Appearance: Casual  Eye Contact:  Fair  Speech:  Clear and Coherent  Volume:  Normal  Mood:  Anxious and Depressed  Affect:  Congruent  Thought Process:  Goal Directed and Descriptions of Associations: Intact  Orientation:  Full (Time, Place, and Person)  Thought Content: Logical   Suicidal Thoughts:  No  Homicidal Thoughts:  No  Memory:  Immediate;   Fair Recent;   Fair Remote;   Fair  Judgement:  Fair  Insight:  Fair  Psychomotor Activity:  Normal  Concentration:  Concentration: Fair and Attention Span: Fair  Recall:  [employer] of Knowledge: Fair  Language: Fair  Akathisia:  No  Handed:  Right  AIMS (if indicated): UTA  Assets:  Communication Skills Desire for Improvement Housing Social Support  ADL's:  Intact  Cognition: WNL  Sleep:  Poor   Screenings: GAD-7   Flowsheet Row Video Visit from 07/15/2020 in [facility] from 05/14/2020 in [facility] Video Visit from 10/10/2019 in [facility] Office Visit from 06/23/2019 in [facility] Office Visit from 03/29/2019 in [facility]  Total GAD-7 Score 19 6 10 3 5     PHQ2-9   Flowsheet Row Video Visit from 07/15/2020 in [facility] Office Visit from 06/17/2020 in [facility] Counselor from 05/14/2020 in [facility] Office Visit from 03/13/2020 in [facility] Office Visit from 12/18/2019 in [facility]  PHQ-2 Total Score 4 1 0 0 3  PHQ-9 Total Score 14 4 0 -- 12       Assessment and Plan: Olivia King is a 35 year old female, widowed, lives in [place],  employed, has a history of MDD, GAD, eating disorder, obesity, lymphedema was evaluated by telemedicine today.  Patient is biologically predisposed given her history of trauma, medical problems.  Patient with psychosocial stressors of death of her husband while in [employer], history of trauma, pandemic, daughter's health issues.  Patient is currently struggling with depression as well as anxiety and sleep problems.  Discussed plan as noted below.  Plan MDD-unstable PHQ 9 today equals 14 Taper off Lexapro.  Patient advised to take Lexapro 10 mg for 3 days, 10 mg every other day for 3 days and then stop taking it. Start Effexor XR 37.5 mg p.o. daily with breakfast Lamictal 25 mg p.o. daily Patient advised to restart CBT.  Encouraged patient to find a new therapist.  Encouraged compliance. Advised patient to start working on her sleep hygiene since her sleep problems are due to her daughter's health issues.  GAD-unstable Start Effexor XR 37.5 mg p.o. daily with breakfast Restart CBT  Tobacco use disorder-improving Provided counseling  Follow-up in clinic in 2 to 3 weeks or sooner if needed.  This note was generated in part or whole with voice recognition software. Voice recognition is usually quite accurate but there are transcription errors that can and very often do occur. I apologize for any typographical errors that were not detected and corrected.      Ursula Alert, MD 07/15/2020, 6:21 PM

## 2020-08-07 ENCOUNTER — Telehealth (INDEPENDENT_AMBULATORY_CARE_PROVIDER_SITE_OTHER): Admitting: Psychiatry

## 2020-08-07 ENCOUNTER — Other Ambulatory Visit: Payer: Self-pay

## 2020-08-07 ENCOUNTER — Encounter: Payer: Self-pay | Admitting: Psychiatry

## 2020-08-07 DIAGNOSIS — F3342 Major depressive disorder, recurrent, in full remission: Secondary | ICD-10-CM | POA: Diagnosis not present

## 2020-08-07 DIAGNOSIS — F17200 Nicotine dependence, unspecified, uncomplicated: Secondary | ICD-10-CM

## 2020-08-07 DIAGNOSIS — F411 Generalized anxiety disorder: Secondary | ICD-10-CM | POA: Diagnosis not present

## 2020-08-07 DIAGNOSIS — F331 Major depressive disorder, recurrent, moderate: Secondary | ICD-10-CM

## 2020-08-07 MED ORDER — VENLAFAXINE HCL ER 37.5 MG PO CP24: 37.5000 mg | ORAL_CAPSULE | Freq: Every day | ORAL | 1 refills | Status: DC

## 2020-08-07 NOTE — Progress Notes (Signed)
Virtual Visit via Video Note  I connected with Olivia King on 08/07/20 at  9:40 AM EDT by a video enabled telemedicine application and verified that I am speaking with the correct person using two identifiers.  Location Provider Location : [facility] Patient Location : Home  Participants: Patient , Provider   I discussed the limitations of evaluation and management by telemedicine and the availability of in person appointments. The patient expressed understanding and agreed to proceed.    I discussed the assessment and treatment plan with the patient. The patient was provided an opportunity to ask questions and all were answered. The patient agreed with the plan and demonstrated an understanding of the instructions.   The patient was advised to call back or seek an in-person evaluation if the symptoms worsen or if the condition fails to improve as anticipated.   [place] MD OP Progress Note  08/08/2020 10:48 AM Tailynn Armetta  MRN:  [id number]  Chief Complaint:  Chief Complaint    Follow-up; Anxiety; Depression     HPI: Olivia King is a 35 year old female, employed, lives in [place], widowed, has a history of MDD, GAD, tobacco use disorder was evaluated by telemedicine today.  Patient today reports since being on the venlafaxine she has noticed a big difference.  She does not feel depressed.  She denies any anhedonia.  She reports she feels more energetic, does not feel tired like she was on the Lexapro anymore.  She reports her anxiety symptoms is improving.  She is sleeping better at night since her daughter does not wake her up anymore.    She reports work is going well.  She denies any side effects to venlafaxine.  Patient denies any suicidality, homicidality or perceptual disturbances.  Patient denies any other concerns today.  Visit Diagnosis:    ICD-10-CM   1. MDD (major depressive disorder), recurrent, in full remission ([place])  F33.42 venlafaxine XR (EFFEXOR-XR)  37.5 MG 24 hr capsule  2. GAD (generalized anxiety disorder)  F41.1   3. Tobacco use disorder  F17.200     Past Psychiatric History: I have reviewed past psychiatric history from my progress note on 10/10/2019.  Past trials of Lexapro, Vyvanse, Abilify, Paxil, Zoloft, Cymbalta  Past Medical History:  Past Medical History:  Diagnosis Date  . Cigarette smoker   . COVID-19 virus detected 03/21/2019  . IUD contraception 02/06/2016   Placed January 23, 3243 by Ardeth Perfect, [place]  . Major depressive disorder, recurrent ([place]) 08/19/2015   zoloft made her cry; cymbalta made her too sleepy  . Migraine headache     Past Surgical History:  Procedure Laterality Date  . CESAREAN SECTION  2004 &2012  . contraceptive implant      Family Psychiatric History: I have reviewed family psychiatric history from progress note on 10/10/2019  Family History:  Family History  Problem Relation Age of Onset  . Hypertension Mother   . Cancer Mother        pancreatic  . Pancreatic cancer Mother   . Hypertension Brother   . Aneurysm Maternal Grandmother   . Heart attack Maternal Grandfather   . Hypertension Paternal Grandmother   . Heart failure Sister     Social History: Reviewed social history from progress note on 10/10/2019 Social History   Socioeconomic History  . Marital status: Widowed    Spouse name: Not on file  . Number of children: 2  . Years of education: Not on file  . Highest education level: Associate  degree: occupational, technical, or vocational program  Occupational History  . Occupation: CMA    Comment: [employer]  Tobacco Use  . Smoking status: Current Every Day Smoker    Years: 9.00    Types: Cigarettes    Start date: 03/05/2011  . Smokeless tobacco: Never Used  . Tobacco comment: a pack last 2 days now - reported 05/10/2020  Vaping Use  . Vaping Use: Some days  . Substances: Flavoring  Substance and Sexual Activity  . Alcohol use: Yes    Alcohol/week: 0.0  standard drinks  . Drug use: No  . Sexual activity: Yes    Partners: Male    Birth control/protection: I.U.D.    Comment: Patient has liletta IUD  Other Topics Concern  . Not on file  Social History Narrative   She is a widow, he died 08/02/2009 while he was deployed.    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 Difficulty of Paying Living Expenses: Not hard at all  Food Insecurity: Not on file  Transportation Needs: Not on file  Physical Activity: Sufficiently Active  . Days of Exercise per Week: 3 days  . Minutes of Exercise per Session: 50 min  Stress: No Stress Concern Present  . Feeling of Stress : Not at all  Social Connections: Moderately Integrated  . Frequency of Communication with Friends and Family: More than three times a week  . Frequency of Social Gatherings with Friends and Family: More than three times a week  . Attends Religious Services: More than 4 times per year  . Active Member of Clubs or Organizations: Yes  . Attends Archivist Meetings: More than 4 times per year  . Marital Status: Widowed    Allergies:  Allergies  Allergen Reactions  . Imitrex [Sumatriptan] Anaphylaxis    Throat and tongue swelling   . Penicillins Rash  . Trintellix [Vortioxetine]     Tingling hands and blurred vision     Metabolic Disorder Labs: Lab Results  Component Value Date   HGBA1C 5.1 05/10/2019   MPG 100 05/10/2019   MPG 103 03/04/2018   No results found for: PROLACTIN Lab Results  Component Value Date   CHOL 178 04/09/2017   TRIG 94 04/09/2017   HDL 49 (L) 04/09/2017   CHOLHDL 3.6 04/09/2017   VLDL 11 03/19/2016   LDLCALC 110 (H) 04/09/2017   LDLCALC 98 03/19/2016   Lab Results  Component Value Date   TSH 2.17 05/10/2019   TSH 3.10 04/09/2017    Therapeutic Level Labs: No results found for: LITHIUM No results found for: VALPROATE No components found for:  CBMZ  Current  Medications: Current Outpatient Medications  Medication Sig Dispense Refill  . cholecalciferol (VITAMIN D) 1000 units tablet Take 1,000 Units by mouth daily.    . clindamycin (CLEOCIN) 300 MG capsule     . fluticasone (FLONASE) 50 MCG/ACT nasal spray Place 2 sprays into both nostrils daily. 16 g 2  . hydrOXYzine (ATARAX/VISTARIL) 10 MG tablet Take 1 tablet (10 mg total) by mouth at bedtime as needed. 90 tablet 0  . ipratropium (ATROVENT) 0.03 % nasal spray ipratropium bromide 21 mcg (0.03 %) nasal spray    . lamoTRIgine (LAMICTAL) 25 MG tablet Take 1 tablet (25 mg total) by mouth daily. 90 tablet 0  . Multiple Vitamin (MULTI-VITAMINS) TABS Take by mouth.    Marland Kitchen olopatadine (PATANOL) 0.1 % ophthalmic solution  INSTILL 1 DROP IN BOTH EYES TWICE DAILY 5 mL 2  . potassium chloride SA (KLOR-CON) 20 MEQ tablet Take 1-2 tablets (20-40 mEq total) by mouth daily. 180 tablet 0  . Rimegepant Sulfate (NURTEC) 75 MG TBDP Take 1 tablet by mouth daily as needed. 9 tablet 2  . torsemide (DEMADEX) 20 MG tablet Take 2 tablets (40 mg total) by mouth every other day. PLEASE CALL OFFICE TO SCHEDULE AN APPOINTMENT FOR FURTHER REFILLS. 60 tablet 0  . venlafaxine XR (EFFEXOR-XR) 37.5 MG 24 hr capsule Take 1 capsule (37.5 mg total) by mouth daily with breakfast. 30 capsule 1   No current facility-administered medications for this visit.     Musculoskeletal: Strength & Muscle Tone: UTA Gait & Station: normal Patient leans: N/A  Psychiatric Specialty Exam: Review of Systems  Psychiatric/Behavioral: The patient is nervous/anxious.   All other systems reviewed and are negative.   There were no vitals taken for this visit.There is no height or weight on file to calculate BMI.  General Appearance: Casual  Eye Contact:  Fair  Speech:  Clear and Coherent  Volume:  Normal  Mood:  Anxious  Affect:  Congruent  Thought Process:  Goal Directed and Descriptions of Associations: Intact  Orientation:  Full (Time, Place,  and Person)  Thought Content: Logical   Suicidal Thoughts:  No  Homicidal Thoughts:  No  Memory:  Immediate;   Fair Recent;   Fair Remote;   Fair  Judgement:  Fair  Insight:  Fair  Psychomotor Activity:  Normal  Concentration:  Concentration: Fair and Attention Span: Fair  Recall:  [employer] of Knowledge: Fair  Language: Fair  Akathisia:  No  Handed:  Right  AIMS (if indicated): UTA  Assets:  Communication Skills Desire for Improvement Housing Social Support  ADL's:  Intact  Cognition: WNL  Sleep:  Improving   Screenings: GAD-7   Flowsheet Row Video Visit from 08/07/2020 in [place] Video Visit from 07/15/2020 in [facility] from 05/14/2020 in [facility] Video Visit from 10/10/2019 in [place] Office Visit from 06/23/2019 in [facility]  Total GAD-7 Score 4 19 6 10 3     PHQ2-9   Flowsheet Row Video Visit from 08/07/2020 in [facility] [place] Video Visit from 07/15/2020 in [place] Office Visit from 06/17/2020 in [facility] Counselor from 05/14/2020 in [place] Office Visit from 03/13/2020 in [facility]  PHQ-2 Total Score 0 4 1 0 0  PHQ-9 Total Score -- 14 4 0 --       Assessment and Plan: Olivia King is a 35 year old female, widowed, lives in [place], employed, has a history of MDD, GAD, eating disorder, obesity, lymphedema was evaluated by telemedicine today.  Patient is biologically predisposed given history of trauma, medical problems .  Patient with psychosocial stressors of death of her husband have while in [employer], history of trauma, pandemic however is currently making progress on the venlafaxine.  Plan as noted below.  Plan MDD in remission Continue venlafaxine extended release 37.5 mg p.o.  daily with breakfast Lamictal 25 mg p.o. daily Patient was encouraged to restart CBT however has been noncompliant.   GAD-improving Continue venlafaxine extended release 37.5 mg p.o. daily Restart CBT  Tobacco use disorder-improving Will monitor closely  Follow-up in clinic in 6 to 8 weeks or sooner if needed.  This note was generated in part  or whole with voice recognition software. Voice recognition is usually quite accurate but there are transcription errors that can and very often do occur. I apologize for any typographical errors that were not detected and corrected.       Ursula Alert, MD 08/08/2020, 10:48 AM

## 2020-08-08 ENCOUNTER — Encounter: Payer: Self-pay | Admitting: Psychiatry

## 2020-08-15 NOTE — Progress Notes (Signed)
Chief Complaint  Patient presents with  . IUD removal    Interested in [employer], [place], MD   HPI:      Ms. Olivia King is a 35 y.o. G2P2 whose LMP was Patient's last menstrual period was 08/02/2020 (approximate)., presents today for NP> 3 yrs IUD removal, OCP start and BV. Liletta placed 10/17. Pt has been amenorrheic but did have light bleeding 2 wks ago and dysmen. She has been having recurrent BV issues and would like it removed. Had same BV sx with nuvaring in past. Would like to give her body a break from IUD to see if BV sx resolve. May then want another IUD. Sex is trigger of BV sx but she plans to be abstinent now. Has sx again today with increased d/c and odor, no irritation. Has done flagyl, clindamycin (the last 2 times with relief), probiotics, boric acid supp, and metrogel twice wkly as preventive (doesn't work). Pt is a smoker. Has migraines without aura.  Currently on azithro for bronchitis.   Last pap Neg 07/26/18, neg HPV DNA 2015/06/09. Due for pap today. FH pancreatic cancer in her mom, genetic testing not done.    Past Medical History:  Diagnosis Date  . Cigarette smoker   . COVID-19 virus detected 03/21/2019  . IUD contraception 02/06/2016   Placed January 23, 2835 by Ardeth Perfect, [place]  . Major depressive disorder, recurrent ([place]) 08/19/2015   zoloft made her cry; cymbalta made her too sleepy  . Migraine headache     Past Surgical History:  Procedure Laterality Date  . CESAREAN SECTION  June 08, 2002 &2012  . contraceptive implant      Family History  Problem Relation Age of Onset  . Hypertension Mother   . Pancreatic cancer Mother   . Hypertension Brother   . Aneurysm Maternal Grandmother   . Heart attack Maternal Grandfather   . Hypertension Paternal Grandmother   . Heart failure Sister     Social History   Socioeconomic History  . Marital status: Widowed    Spouse name: Not on file  . Number of children: 2  . Years of  education: Not on file  . Highest education level: Associate degree: occupational, Hotel manager, or vocational program  Occupational History  . Occupation: CMA    Comment: [employer]  Tobacco Use  . Smoking status: Current Every Day Smoker    Years: 9.00    Types: Cigarettes    Start date: 03/05/2011  . Smokeless tobacco: Never Used  . Tobacco comment: a pack last 2 days now - reported 05/10/2020  Vaping Use  . Vaping Use: Some days  . Substances: Flavoring  Substance and Sexual Activity  . Alcohol use: Yes    Alcohol/week: 0.0 standard drinks  . Drug use: No  . Sexual activity: Yes    Partners: Male    Birth control/protection: I.U.D.    Comment: Patient has liletta IUD  Other Topics Concern  . Not on file  Social History Narrative   She is a widow, he died 06/08/09 while he was deployed.    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 Difficulty of Paying Living Expenses: Not hard at all  Food Insecurity: Not on file  Transportation Needs: Not on file  Physical Activity: Sufficiently Active  . Days of Exercise per Week: 3 days  . Minutes of Exercise per Session: 50  min  Stress: No Stress Concern Present  . Feeling of Stress : Not at all  Social Connections: Moderately Integrated  . Frequency of Communication with Friends and Family: More than three times a week  . Frequency of Social Gatherings with Friends and Family: More than three times a week  . Attends Religious Services: More than 4 times per year  . Active Member of Clubs or Organizations: Yes  . Attends Archivist Meetings: More than 4 times per year  . Marital Status: Widowed  Intimate Partner Violence: Not At Risk  . Fear of Current or Ex-Partner: No  . Emotionally Abused: No  . Physically Abused: No  . Sexually Abused: No    Outpatient Medications Prior to Visit  Medication Sig Dispense Refill  . albuterol (PROVENTIL) (2.5  MG/3ML) 0.083% nebulizer solution Inhale into the lungs.    Marland Kitchen albuterol (VENTOLIN HFA) 108 (90 Base) MCG/ACT inhaler Inhale into the lungs.    Marland Kitchen azithromycin (ZITHROMAX) 250 MG tablet Take 250 mg by mouth as directed.    . benzonatate (TESSALON) 200 MG capsule Take by mouth.    . hydrOXYzine (ATARAX/VISTARIL) 10 MG tablet Take 1 tablet (10 mg total) by mouth at bedtime as needed. 90 tablet 0  . ipratropium (ATROVENT) 0.03 % nasal spray ipratropium bromide 21 mcg (0.03 %) nasal spray    . methylPREDNISolone (MEDROL DOSEPAK) 4 MG TBPK tablet Take by mouth.    . Multiple Vitamin (MULTI-VITAMINS) TABS Take by mouth.    . potassium chloride SA (KLOR-CON) 20 MEQ tablet Take 1-2 tablets (20-40 mEq total) by mouth daily. 180 tablet 0  . torsemide (DEMADEX) 20 MG tablet Take 2 tablets (40 mg total) by mouth every other day. PLEASE CALL OFFICE TO SCHEDULE AN APPOINTMENT FOR FURTHER REFILLS. 60 tablet 0  . venlafaxine XR (EFFEXOR-XR) 37.5 MG 24 hr capsule Take 1 capsule (37.5 mg total) by mouth daily with breakfast. 30 capsule 1  . cholecalciferol (VITAMIN D) 1000 units tablet Take 1,000 Units by mouth daily.    . clindamycin (CLEOCIN) 300 MG capsule     . fluticasone (FLONASE) 50 MCG/ACT nasal spray Place 2 sprays into both nostrils daily. 16 g 2  . lamoTRIgine (LAMICTAL) 25 MG tablet Take 1 tablet (25 mg total) by mouth daily. 90 tablet 0  . olopatadine (PATANOL) 0.1 % ophthalmic solution INSTILL 1 DROP IN BOTH EYES TWICE DAILY 5 mL 2  . Rimegepant Sulfate (NURTEC) 75 MG TBDP Take 1 tablet by mouth daily as needed. 9 tablet 2   No facility-administered medications prior to visit.     ROS:  Review of Systems  Constitutional: Negative for fever.  Gastrointestinal: Negative for blood in stool, constipation, diarrhea, nausea and vomiting.  Genitourinary: Positive for vaginal discharge. Negative for dyspareunia, dysuria, flank pain, frequency, hematuria, urgency, vaginal bleeding and vaginal pain.   Musculoskeletal: Negative for back pain.  Skin: Negative for rash.  BREAST: No symptoms   OBJECTIVE:   Vitals:  BP 128/80   Ht 5' 3" (1.6 m)   Wt 236 lb (107 kg)   LMP 08/02/2020 (Approximate)   BMI 41.81 kg/m   Physical Exam Constitutional:      General: She is not in acute distress. Genitourinary:     Vulva normal.     Right Labia: No rash, tenderness or lesions.    Left Labia: No tenderness, lesions or rash.    No vaginal discharge, erythema, tenderness or bleeding.      Right Adnexa:  not tender and no mass present.    Left Adnexa: not tender and no mass present.    IUD strings visualized.     Uterus is not enlarged or tender.  Pulmonary:     Effort: Pulmonary effort is normal.  Musculoskeletal:        General: Normal range of motion.  Neurological:     General: No focal deficit present.     Mental Status: She is alert.     Cranial Nerves: No cranial nerve deficit.  Skin:    General: Skin is warm and dry.  Psychiatric:        Mood and Affect: Mood normal.        Behavior: Behavior normal.        Thought Content: Thought content normal.        Judgment: Judgment normal.  Vitals and nursing note reviewed.    Results for orders placed or performed in visit on 08/16/20 (from the past 24 hour(s))  POCT Wet Prep with KOH     Status: Abnormal   Collection Time: 08/16/20 10:07 AM  Result Value Ref Range   Trichomonas, UA Negative    Clue Cells Wet Prep HPF POC pos    Epithelial Wet Prep HPF POC     Yeast Wet Prep HPF POC neg    Bacteria Wet Prep HPF POC     RBC Wet Prep HPF POC     WBC Wet Prep HPF POC     KOH Prep POC Positive (A) Negative     IUD Removal Strings of IUD identified and grasped.  IUD removed without problem with ring forceps.  Pt tolerated this well.  IUD noted to be intact.  Assessment:  BV (bacterial vaginosis) - Plan: clindamycin (CLEOCIN) 300 MG capsule, POCT Wet Prep with KOH; pos sx and wet prep. Recurrent sx. Rx clindamycin (start  after finishing zpak). Boric acid after sex/condoms, F/u prn.   Cervical cancer screening - Plan: Cytology - PAP,   Screening for STD (sexually transmitted disease) - Plan: Cytology - PAP  Screening for HPV (human papillomavirus) - Plan: Cytology - PAP,   Encounter for initial prescription of contraceptive pills - Plan: norethindrone (MICRONOR) 0.35 MG tablet; Prog only options discussed due to tobacco use. Rx camila, start today. Condoms for 1 wk. F/u prn. May want to go back to IUD.   Family history of pancreatic cancer - Plan: Integrated [facility] ([place]); MyRisk testing discussed and done today. Will call with results.  Encounter for IUD removal--pt tolerated well  Meds ordered this encounter  Medications  . clindamycin (CLEOCIN) 300 MG capsule    Sig: Take 1 capsule (300 mg total) by mouth 2 (two) times daily for 7 days.    Dispense:  14 capsule    Refill:  0    Order Specific Question:   Supervising Provider    Answer:   Gae Dry [id number]  . norethindrone (MICRONOR) 0.35 MG tablet    Sig: Take 1 tablet (0.35 mg total) by mouth daily.    Dispense:  84 tablet    Refill:  3    Order Specific Question:   Supervising Provider    Answer:   Gae Dry [[id number]]     Plan: IUD removed and plan for contraception is oral progesterone-only contraceptive. She was amenable to this plan.   B. , PA-C 08/16/2020 10:07 AM

## 2020-08-16 ENCOUNTER — Other Ambulatory Visit (HOSPITAL_COMMUNITY)
Admission: RE | Admit: 2020-08-16 | Discharge: 2020-08-16 | Disposition: A | Discharge status: Home / Self Care | Source: Ambulatory Visit | Attending: Obstetrics and Gynecology | Admitting: Obstetrics and Gynecology

## 2020-08-16 ENCOUNTER — Ambulatory Visit (INDEPENDENT_AMBULATORY_CARE_PROVIDER_SITE_OTHER): Admitting: Obstetrics and Gynecology

## 2020-08-16 ENCOUNTER — Encounter: Payer: Self-pay | Admitting: Obstetrics and Gynecology

## 2020-08-16 ENCOUNTER — Other Ambulatory Visit: Payer: Self-pay

## 2020-08-16 VITALS — BP 128/80 | Ht 63.0 in | Wt 236.0 lb

## 2020-08-16 DIAGNOSIS — Z113 Encounter for screening for infections with a predominantly sexual mode of transmission: Secondary | ICD-10-CM

## 2020-08-16 DIAGNOSIS — Z1151 Encounter for screening for human papillomavirus (HPV): Secondary | ICD-10-CM

## 2020-08-16 DIAGNOSIS — Z30011 Encounter for initial prescription of contraceptive pills: Secondary | ICD-10-CM

## 2020-08-16 DIAGNOSIS — Z30432 Encounter for removal of intrauterine contraceptive device: Secondary | ICD-10-CM | POA: Diagnosis not present

## 2020-08-16 DIAGNOSIS — Z01419 Encounter for gynecological examination (general) (routine) without abnormal findings: Secondary | ICD-10-CM

## 2020-08-16 DIAGNOSIS — Z124 Encounter for screening for malignant neoplasm of cervix: Secondary | ICD-10-CM | POA: Insufficient documentation

## 2020-08-16 DIAGNOSIS — Z800 Family history of malignant neoplasm of digestive organs: Secondary | ICD-10-CM

## 2020-08-16 DIAGNOSIS — B9689 Other specified bacterial agents as the cause of diseases classified elsewhere: Secondary | ICD-10-CM | POA: Diagnosis not present

## 2020-08-16 DIAGNOSIS — N760 Acute vaginitis: Secondary | ICD-10-CM | POA: Diagnosis not present

## 2020-08-16 LAB — POCT WET PREP WITH KOH
Clue Cells Wet Prep HPF POC: POSITIVE
KOH Prep POC: POSITIVE — AB
Trichomonas, UA: NEGATIVE
Yeast Wet Prep HPF POC: NEGATIVE

## 2020-08-16 MED ORDER — CLINDAMYCIN HCL 300 MG PO CAPS: 300.0000 mg | ORAL_CAPSULE | Freq: Two times a day (BID) | ORAL | 0 refills | Status: DC

## 2020-08-16 MED ORDER — NORETHINDRONE 0.35 MG PO TABS: 1.0000 | ORAL_TABLET | Freq: Every day | ORAL | 3 refills | Status: DC

## 2020-08-16 NOTE — Patient Instructions (Signed)
I value your feedback and you entrusting us with your care. If you get a [facility] patient survey, I would appreciate you taking the time to let us know about your experience today. Thank you! ? ? ?

## 2020-08-18 DIAGNOSIS — Z800 Family history of malignant neoplasm of digestive organs: Secondary | ICD-10-CM

## 2020-08-18 HISTORY — DX: Family history of malignant neoplasm of digestive organs: Z80.0

## 2020-08-20 LAB — CYTOLOGY - PAP
Chlamydia: NEGATIVE
Comment: NEGATIVE
Comment: NEGATIVE
Comment: NORMAL
High risk HPV: NEGATIVE
Neisseria Gonorrhea: NEGATIVE

## 2020-08-26 ENCOUNTER — Other Ambulatory Visit: Payer: Self-pay | Admitting: Obstetrics and Gynecology

## 2020-08-26 ENCOUNTER — Encounter: Payer: Self-pay | Admitting: Obstetrics and Gynecology

## 2020-08-26 DIAGNOSIS — N760 Acute vaginitis: Secondary | ICD-10-CM

## 2020-08-26 DIAGNOSIS — B9689 Other specified bacterial agents as the cause of diseases classified elsewhere: Secondary | ICD-10-CM

## 2020-08-26 MED ORDER — CLINDAMYCIN HCL 300 MG PO CAPS: 300.0000 mg | ORAL_CAPSULE | Freq: Two times a day (BID) | ORAL | 0 refills | Status: AC

## 2020-08-26 NOTE — Progress Notes (Signed)
Rx RF clindamycin for recurrent BV

## 2020-09-02 ENCOUNTER — Encounter: Payer: Self-pay | Admitting: Obstetrics and Gynecology

## 2020-09-06 ENCOUNTER — Other Ambulatory Visit: Payer: Self-pay | Admitting: Obstetrics and Gynecology

## 2020-09-06 DIAGNOSIS — N898 Other specified noninflammatory disorders of vagina: Secondary | ICD-10-CM

## 2020-09-06 MED ORDER — METRONIDAZOLE 500 MG PO TABS: 500.0000 mg | ORAL_TABLET | Freq: Two times a day (BID) | ORAL | 0 refills | Status: DC

## 2020-09-06 NOTE — Progress Notes (Signed)
Rx flagyl for persistent BV symptoms.

## 2020-09-10 ENCOUNTER — Telehealth: Payer: Self-pay | Admitting: Obstetrics and Gynecology

## 2020-09-10 ENCOUNTER — Encounter: Payer: Self-pay | Admitting: Obstetrics and Gynecology

## 2020-09-10 NOTE — Telephone Encounter (Signed)
Pt aware of neg MyRisk results except SDHA VUS. IBIS=9.2%/riskscore=7.3%  Patient understands these results only apply to her and her children, and this is not indicative of genetic testing results of her other family members. It is recommended that her other family members have genetic testing done.  Pt also understands negative genetic testing doesn't mean she will never get any of these cancers.   Hard copy mailed to pt. F/u prn.

## 2020-09-18 ENCOUNTER — Telehealth: Admitting: Psychiatry

## 2020-09-24 ENCOUNTER — Ambulatory Visit: Admitting: Podiatry

## 2020-09-24 ENCOUNTER — Other Ambulatory Visit: Payer: Self-pay

## 2020-09-24 DIAGNOSIS — M722 Plantar fascial fibromatosis: Secondary | ICD-10-CM | POA: Diagnosis not present

## 2020-09-24 MED ORDER — BETAMETHASONE SOD PHOS & ACET 6 (3-3) MG/ML IJ SUSP
3.0000 mg | Freq: Once | INTRAMUSCULAR | Status: AC
  Administered 2020-09-24: 3 mg via INTRA_ARTICULAR

## 2020-09-24 MED ORDER — METHYLPREDNISOLONE 4 MG PO TBPK: ORAL_TABLET | ORAL | 0 refills | Status: DC

## 2020-09-24 NOTE — Progress Notes (Signed)
   Subjective: 35 y.o. female presenting today for new complaint regarding left heel pain has been going on for a few months now.  Gradual onset.  She denies a history of injury.  She does have a history of plantar fasciitis to the right foot which is currently asymptomatic.  Currently she is only taking Motrin for the pain.  She also wears good supportive shoes and sneakers.  She presents for further treatment and evaluation   Past Medical History:  Diagnosis Date  . Cigarette smoker   . COVID-19 virus detected 03/21/2019  . Family history of pancreatic cancer 08/2020   MyRisk neg except SDHA VUS; IBIS=9.2%/riskscore=7.3%  . IUD contraception 02/06/2016   Placed January 22, 257 by Ardeth Perfect, [place]  . Major depressive disorder, recurrent ([place]) 08/19/2015   zoloft made her cry; cymbalta made her too sleepy  . Migraine headache      Objective: Physical Exam General: The patient is alert and oriented x3 in no acute distress.  Dermatology: Skin is warm, dry and supple bilateral lower extremities. Negative for open lesions or macerations bilateral.   Vascular: Dorsalis Pedis and Posterior Tibial pulses palpable bilateral.  Capillary fill time is immediate to all digits.  Neurological: Epicritic and protective threshold intact bilateral.   Musculoskeletal: Tenderness to palpation to the plantar aspect of the left heel along the plantar fascia. All other joints range of motion within normal limits bilateral. Strength 5/5 in all groups bilateral.   Radiographic exam: Normal osseous mineralization. Joint spaces preserved. No fracture/dislocation/boney destruction. No other soft tissue abnormalities or radiopaque foreign bodies.   Assessment: 1. Plantar fasciitis left foot  Plan of Care:  1. Patient evaluated. Xrays reviewed.   2. Injection of 0.5cc Celestone soluspan injected into the left plantar fascia.  3. Rx for Medrol Dose Pak placed 4.  Resume OTC Motrin after  completion of the Dosepak 5.  Continue wearing good supportive shoes and insoles 6. Instructed patient regarding therapies and modalities at home to alleviate symptoms.  7. Return to clinic in 4 weeks.    *Works 12-hour shifts at [employer], DPM [facility]  Dr. Edrick Kins, DPM    [address], [place] [address]                Office [phone]  Fax [phone]

## 2020-09-25 ENCOUNTER — Encounter: Payer: Self-pay | Admitting: Psychiatry

## 2020-09-25 ENCOUNTER — Telehealth (INDEPENDENT_AMBULATORY_CARE_PROVIDER_SITE_OTHER): Admitting: Psychiatry

## 2020-09-25 DIAGNOSIS — F411 Generalized anxiety disorder: Secondary | ICD-10-CM

## 2020-09-25 DIAGNOSIS — F17200 Nicotine dependence, unspecified, uncomplicated: Secondary | ICD-10-CM

## 2020-09-25 DIAGNOSIS — F3342 Major depressive disorder, recurrent, in full remission: Secondary | ICD-10-CM | POA: Diagnosis not present

## 2020-09-25 MED ORDER — VENLAFAXINE HCL ER 75 MG PO CP24: 75.0000 mg | ORAL_CAPSULE | Freq: Every day | ORAL | 1 refills | Status: DC

## 2020-09-25 NOTE — Progress Notes (Signed)
Virtual Visit via Video Note  I connected with Olivia King on 09/25/20 at  3:20 PM EDT by a video enabled telemedicine application and verified that I am speaking with the correct person using two identifiers.  Location Provider Location : Office Patient Location : Work  Participants: Patient , Provider   I discussed the limitations of evaluation and management by telemedicine and the availability of in person appointments. The patient expressed understanding and agreed to proceed.   I discussed the assessment and treatment plan with the patient. The patient was provided an opportunity to ask questions and all were answered. The patient agreed with the plan and demonstrated an understanding of the instructions.   The patient was advised to call back or seek an in-person evaluation if the symptoms worsen or if the condition fails to improve as anticipated.  [facility] MD OP Progress Note  09/25/2020 5:54 PM Olivia King  MRN:  [id number]  Chief Complaint:  Chief Complaint    Follow-up; Depression     HPI: Olivia King is a 35 year old female, employed, lives in [place], widowed, has a history of MDD, GAD, tobacco use disorder was evaluated by telemedicine today.  Patient today reports she is currently struggling with psychosocial stressors of her daughter going through some problems.  She reports this does make her worried.  She hence has racing thoughts at night.  She reports out of 7 days a week she wakes up with racing thoughts and anxiety at least 2-3 times a week.  This does have an impact on her sleep those nights.  Patient reports she is compliant on the venlafaxine.  She is tolerating it well.  She denies any sadness.  She reports appetite is fair.  She has not been able to schedule an appointment with a therapist yet.  She however reports she will try to establish care as soon as possible.  She denies any suicidality, homicidality or perceptual disturbances.  She  is interested in smoking cessation however did not do well on Wellbutrin.  She could not afford the Chantix.  She reports she believes it is more due to habit and not due to craving.  Patient denies any other concerns today.  Visit Diagnosis:    ICD-10-CM   1. MDD (major depressive disorder), recurrent, in full remission ([place])  F33.42 venlafaxine XR (EFFEXOR XR) 75 MG 24 hr capsule  2. GAD (generalized anxiety disorder)  F41.1 venlafaxine XR (EFFEXOR XR) 75 MG 24 hr capsule  3. Tobacco use disorder  F17.200     Past Psychiatric History: I have reviewed past psychiatric history from progress note on 10/10/2019.  Past trials of Lexapro, Vyvanse, Abilify, Paxil, Zoloft, Cymbalta  Past Medical History:  Past Medical History:  Diagnosis Date  . Cigarette smoker   . COVID-19 virus detected 03/21/2019  . Family history of pancreatic cancer 08/2020   MyRisk neg except SDHA VUS; IBIS=9.2%/riskscore=7.3%  . IUD contraception 02/06/2016   Placed January 23, 9740 by Ardeth Perfect, [place]  . Major depressive disorder, recurrent ([place]) 08/19/2015   zoloft made her cry; cymbalta made her too sleepy  . Migraine headache     Past Surgical History:  Procedure Laterality Date  . CESAREAN SECTION  2004 &2012  . contraceptive implant      Family Psychiatric History: I have reviewed family psychiatric history from progress note on 10/10/2019  Family History:  Family History  Problem Relation Age of Onset  . Hypertension Mother   . Pancreatic cancer Mother  85  . Hypertension Brother   . Aneurysm Maternal Grandmother   . Heart attack Maternal Grandfather   . Hypertension Paternal Grandmother   . Heart failure Sister     Social History: Reviewed social history from progress note on 10/10/2019 Social History   Socioeconomic History  . Marital status: Widowed    Spouse name: Not on file  . Number of children: 2  . Years of education: Not on file  . Highest education level: Associate  degree: occupational, Hotel manager, or vocational program  Occupational History  . Occupation: CMA    Comment: [employer]  Tobacco Use  . Smoking status: Current Every Day Smoker    Years: 9.00    Types: Cigarettes    Start date: 03/05/2011  . Smokeless tobacco: Never Used  . Tobacco comment: a pack last 2 days now - reported 05/10/2020  Vaping Use  . Vaping Use: Some days  . Substances: Flavoring  Substance and Sexual Activity  . Alcohol use: Yes    Alcohol/week: 0.0 standard drinks  . Drug use: No  . Sexual activity: Yes    Partners: Male    Birth control/protection: I.U.D.    Comment: Patient has liletta IUD  Other Topics Concern  . Not on file  Social History Narrative   She is a widow, he died 07/13/2009 while he was deployed.    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 9678   Social Determinants of Health   Financial Resource Strain: Not on file  Food Insecurity: Not on file  Transportation Needs: Not on file  Physical Activity: Not on file  Stress: Not on file  Social Connections: Not on file    Allergies:  Allergies  Allergen Reactions  . Imitrex [Sumatriptan] Anaphylaxis    Throat and tongue swelling   . Penicillins Rash  . Trintellix [Vortioxetine]     Tingling hands and blurred vision     Metabolic Disorder Labs: Lab Results  Component Value Date   HGBA1C 5.1 05/10/2019   MPG 100 05/10/2019   MPG 103 03/04/2018   No results found for: PROLACTIN Lab Results  Component Value Date   CHOL 178 04/09/2017   TRIG 94 04/09/2017   HDL 49 (L) 04/09/2017   CHOLHDL 3.6 04/09/2017   VLDL 11 03/19/2016   LDLCALC 110 (H) 04/09/2017   LDLCALC 98 03/19/2016   Lab Results  Component Value Date   TSH 2.17 05/10/2019   TSH 3.10 04/09/2017    Therapeutic Level Labs: No results found for: LITHIUM No results found for: VALPROATE No components found for:  CBMZ  Current Medications: Current Outpatient Medications  Medication Sig Dispense Refill  .  neomycin-polymyxin-hydrocortisone (CORTISPORIN) 3.5-10000-1 OTIC suspension     . venlafaxine XR (EFFEXOR XR) 75 MG 24 hr capsule Take 1 capsule (75 mg total) by mouth daily with breakfast. 30 capsule 1  . albuterol (PROVENTIL) (2.5 MG/3ML) 0.083% nebulizer solution Inhale into the lungs.    Marland Kitchen albuterol (VENTOLIN HFA) 108 (90 Base) MCG/ACT inhaler Inhale into the lungs.    Marland Kitchen azithromycin (ZITHROMAX) 250 MG tablet Take 250 mg by mouth as directed. (Patient not taking: Reported on 09/25/2020)    . benzonatate (TESSALON) 200 MG capsule Take by mouth. (Patient not taking: Reported on 09/25/2020)    . clindamycin (CLEOCIN) 300 MG capsule  (Patient not taking: Reported on 09/25/2020)    . hydrOXYzine (ATARAX/VISTARIL) 10 MG tablet Take 1 tablet (10 mg total) by mouth at bedtime as needed. 90 tablet 0  .  ipratropium (ATROVENT) 0.03 % nasal spray ipratropium bromide 21 mcg (0.03 %) nasal spray    . methylPREDNISolone (MEDROL DOSEPAK) 4 MG TBPK tablet 6 day dose pack - take as directed 21 tablet 0  . metroNIDAZOLE (FLAGYL) 500 MG tablet Take 1 tablet (500 mg total) by mouth 2 (two) times daily. 14 tablet 0  . Multiple Vitamin (MULTI-VITAMINS) TABS Take by mouth.    . neomycin-polymyxin-hydrocortisone (CORTISPORIN) 3.5-10000-1 OTIC suspension SMARTSIG:In Ear(s)    . norethindrone (MICRONOR) 0.35 MG tablet Take 1 tablet (0.35 mg total) by mouth daily. 84 tablet 3  . PAXLOVID 20 x 150 MG & 10 x 100MG  TBPK SMARTSIG:1 Unspecified By Mouth Twice Daily (Patient not taking: Reported on 09/25/2020)    . potassium chloride SA (KLOR-CON) 20 MEQ tablet Take 1-2 tablets (20-40 mEq total) by mouth daily. 180 tablet 0  . torsemide (DEMADEX) 20 MG tablet Take 2 tablets (40 mg total) by mouth every other day. PLEASE CALL OFFICE TO SCHEDULE AN APPOINTMENT FOR FURTHER REFILLS. 60 tablet 0   No current facility-administered medications for this visit.     Musculoskeletal: Strength & Muscle Tone: UTA Gait & Station: UTA Patient  leans: N/A  Psychiatric Specialty Exam: Review of Systems  Psychiatric/Behavioral: Positive for sleep disturbance. The patient is nervous/anxious.   All other systems reviewed and are negative.   There were no vitals taken for this visit.There is no height or weight on file to calculate BMI.  General Appearance: Casual  Eye Contact:  Fair  Speech:  Normal Rate  Volume:  Normal  Mood:  Anxious  Affect:  Congruent  Thought Process:  Goal Directed and Descriptions of Associations: Intact  Orientation:  Full (Time, Place, and Person)  Thought Content: Logical   Suicidal Thoughts:  No  Homicidal Thoughts:  No  Memory:  Immediate;   Fair Recent;   Fair Remote;   Fair  Judgement:  Fair  Insight:  Fair  Psychomotor Activity:  Normal  Concentration:  Concentration: Fair and Attention Span: Fair  Recall:  [employer] of Knowledge: Fair  Language: Fair  Akathisia:  No  Handed:  Right  AIMS (if indicated): UTA  Assets:  Communication Skills Desire for Improvement Housing Social Support  ADL's:  Intact  Cognition: WNL  Sleep:  Restless due to anxiety some nights   Screenings: GAD-7   Flowsheet Row Video Visit from 09/25/2020 in [facility] Video Visit from 08/07/2020 in [facility] Video Visit from 07/15/2020 in [facility] from 05/14/2020 in [facility] Video Visit from 10/10/2019 in [facility]  Total GAD-7 Score 13 4 19 6 10     PHQ2-9   Flowsheet Row Video Visit from 09/25/2020 in [facility] Video Visit from 08/07/2020 in [facility] Video Visit from 07/15/2020 in [facility] Office Visit from 06/17/2020 in [facility] Counselor from 05/14/2020 in [facility]  PHQ-2 Total Score 2 0 4 1 0  PHQ-9 Total  Score 4 -- 14 4 0       Assessment and Plan: Olivia King is a 35 year old female, widowed, lives in [place], employed, has a history of MDD, GAD, eating disorder, was evaluated by telemedicine today.  Patient is currently struggling with psychosocial stressors of her daughter going through problems.  Patient does report anxiety as well as sleep problems due to her anxiety few times a week.  She will benefit  from the following plan.  Plan MDD in remission Venlafaxine extended release as prescribed Lamictal 25 mg p.o. daily Patient encouraged to start CBT.  GAD-unstable Increase venlafaxine extended release to 75 mg p.o. daily Patient to monitor her sleep closely.  Sleep problems likely due to to anxiety. Patient encouraged to start CBT  Tobacco use disorder- unstable Provided counseling. She failed Wellbutrin and cannot afford Chantix. Discussed trying nicotine patches, lozenges.  Follow-up in clinic in  4 weeks or sooner if needed.  This note was generated in part or whole with voice recognition software. Voice recognition is usually quite accurate but there are transcription errors that can and very often do occur. I apologize for any typographical errors that were not detected and corrected.        Ursula Alert, MD 09/25/2020, 5:54 PM

## 2020-10-04 IMAGING — MR MR FOOT*R* W/O CM
5 series · 40 of 40 positions shown · non-contrast
Comparison: X-ray 06/09/2019

CLINICAL DATA: Lateral right foot pain since Sunday April, 2019

EXAM:
MRI OF THE RIGHT FOREFOOT WITHOUT CONTRAST
TECHNIQUE: Multiplanar, multisequence MR imaging of the right forefoot was
performed. No intravenous contrast was administered.

[Series 3: T1 · coronal · right · 3.0mm · 0.38mm/px · 12 of 45 slices shown (1 of 2)]
[im 1/45]
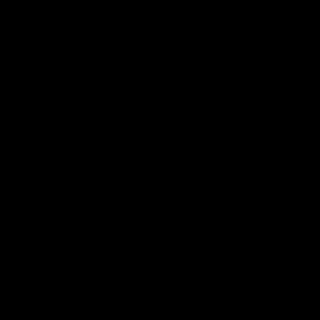
[im 5/45]
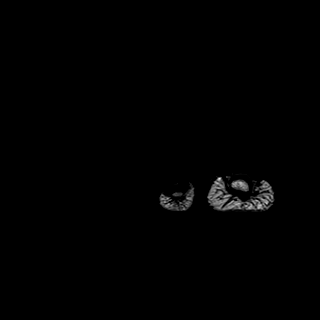
[im 9/45]
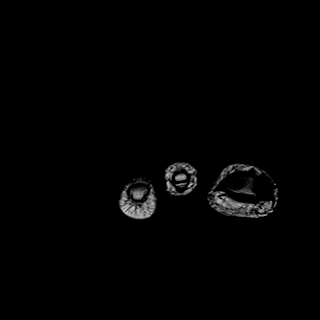
[im 13/45]
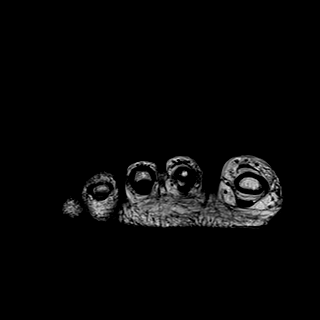
[im 17/45]
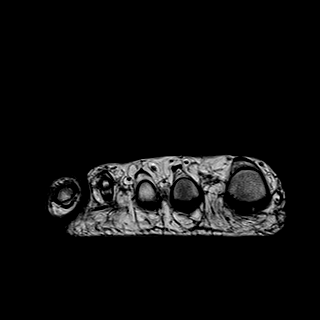
[im 21/45]
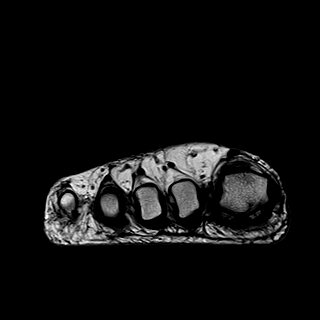
[im 25/45]
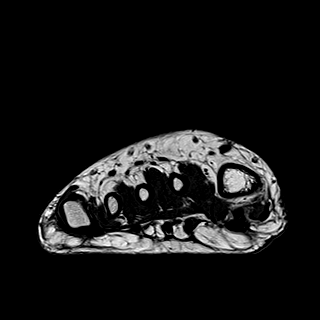
[im 29/45]
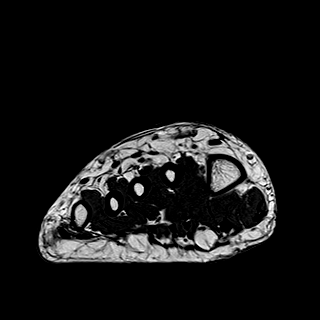
[im 33/45]
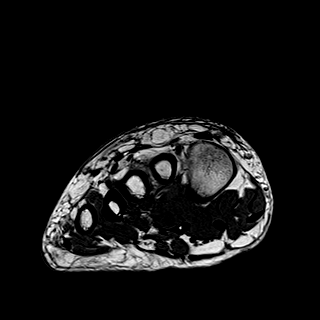
[im 37/45]
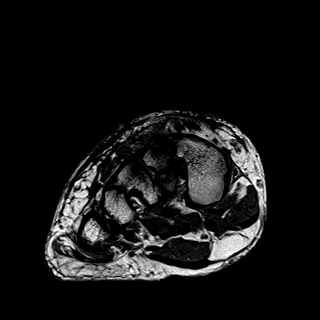
[im 41/45]
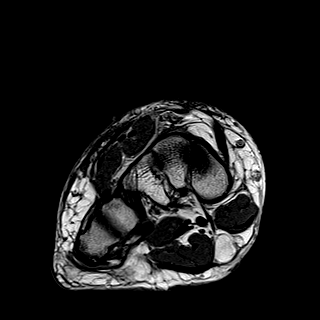
[im 45/45]
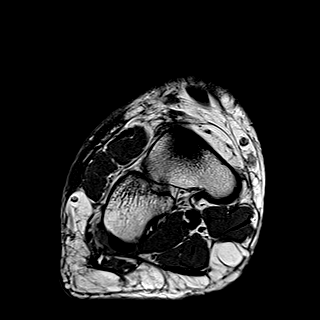

[Series 5: T2 · coronal · right · 3.0mm · 0.38mm/px · 11 of 45 slices shown (1 of 2)]
[im 1/45]
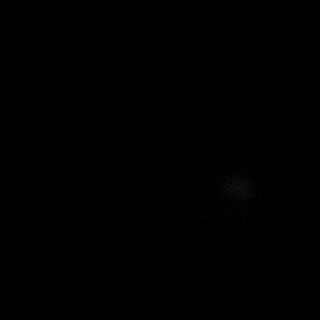
[im 5/45]
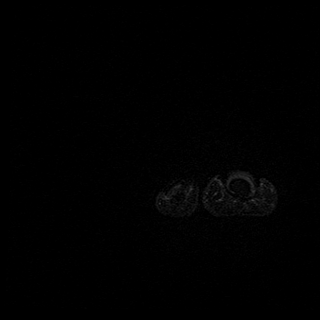
[im 9/45]
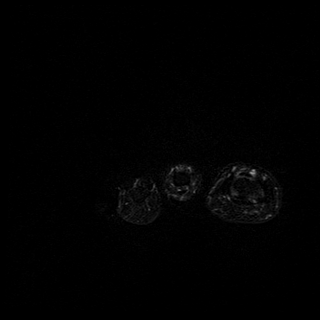
[im 14/45]
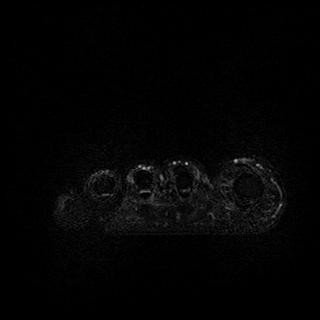
[im 18/45]
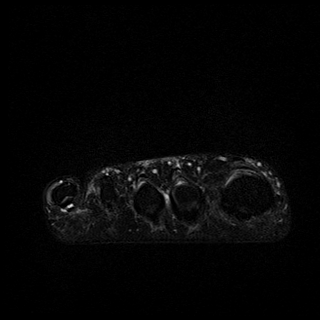
[im 23/45]
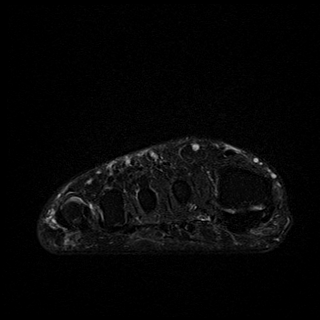
[im 27/45]
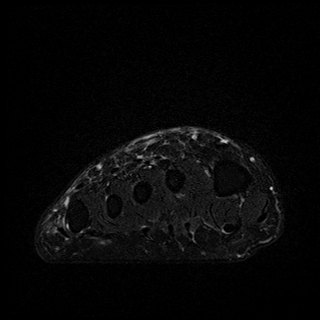
[im 31/45]
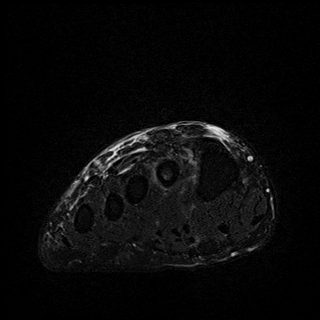
[im 36/45]
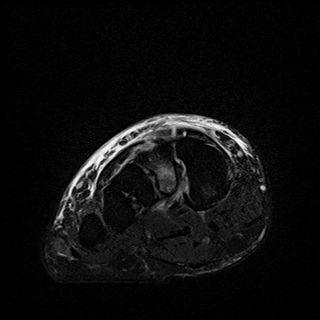
[im 40/45]
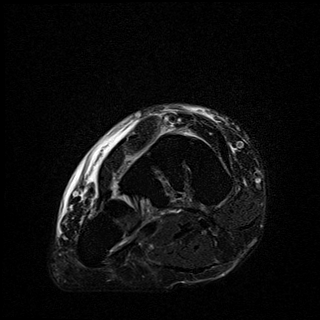
[im 45/45]
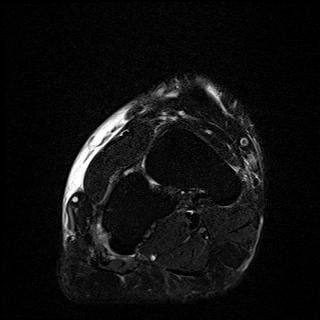

[Series 6: T1 · axial · right · 3.0mm · 0.70mm/px · z∈[-118,-46]mm · 5 of 20 slices shown (2 of 2)]
[im 1/20]
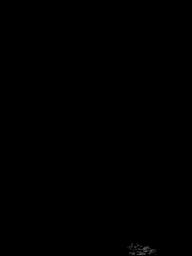
[im 5/20]
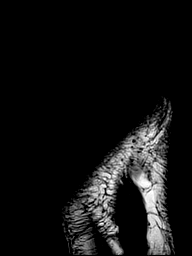
[im 10/20]
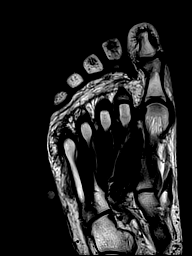
[im 15/20]
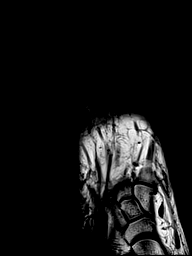
[im 20/20]
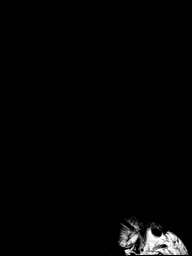

[Series 8: T2 · axial · right · 3.0mm · 0.70mm/px · z∈[-118,-46]mm · 5 of 20 slices shown (2 of 2)]
[im 1/20]
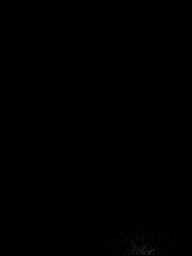
[im 5/20]
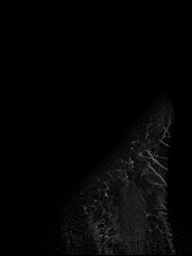
[im 10/20]
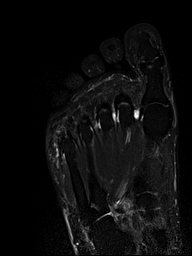
[im 15/20]
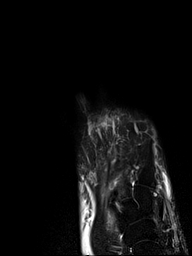
[im 20/20]
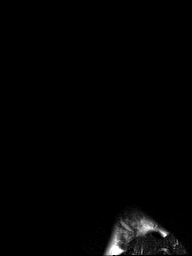

[Series 9: STIR · sagittal · right · 3.0mm · 0.62mm/px · 7 of 29 slices shown]
[im 1/29]
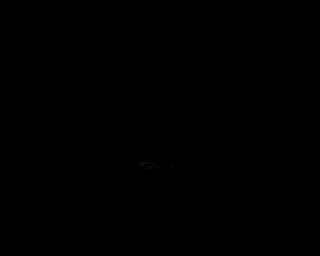
[im 5/29]
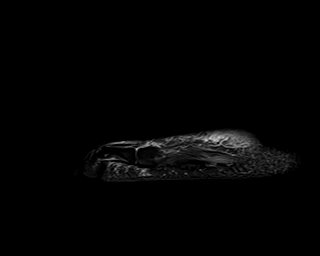
[im 10/29]
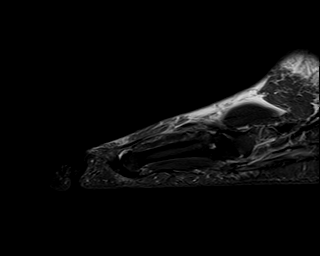
[im 15/29]
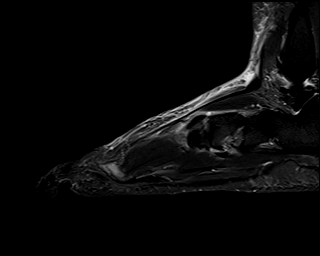
[im 19/29]
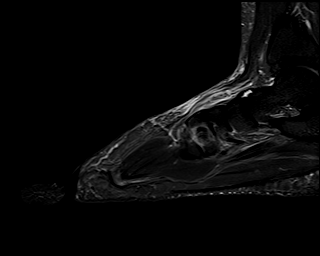
[im 24/29]
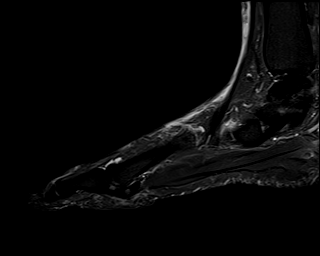
[im 29/29]
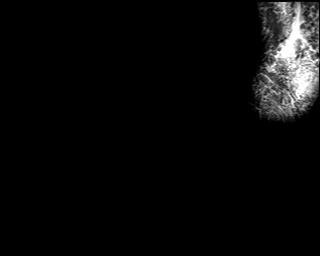

[40 of 40 positions shown; findings below may reference images not displayed]

FINDINGS: Bones/Joint/Cartilage

Acute nondisplaced fracture of the plantar aspect of the second
metatarsal base with intra-articular extension to the second
8-9). Surrounding bone marrow edema. No marrow signal changes within
the adjacent intermediate cuneiform. No additional fractures.
Osseous alignment is normal without dislocation. Joint spaces are
maintained without significant arthropathy. No joint effusions.

Ligaments

There is soft tissue edema in the region of the plantar band of the
Lisfranc ligament without a tear. The dorsal band is intact.
Collateral ligaments of the foot are intact. Intact plantar plates.

Muscles and Tendons

Normal muscle bulk without atrophy or fatty infiltration. Intact
flexor and extensor tendons. No tenosynovitis.

Soft tissues

Soft tissue edema surrounds the fracture site. There is dorsal
subcutaneous edema. Small volume bursal fluid within the first,
second, and third intermetatarsal spaces. Otherwise, no focal fluid
collection or hematoma.
IMPRESSION: 1. Acute nondisplaced fracture of the second metatarsal base with
intra-articular extension to the second TMT joint.
2. Edema within the plantar band of the Lisfranc ligament is favored
to be reactive or represent a mild sprain. No Lisfranc ligament
tear.
3. First, second, and third intermetatarsal bursitis.

These results will be called to the ordering clinician or
representative by the Radiologist Assistant, and communication
documented in the PACS or [REDACTED].

## 2020-10-28 ENCOUNTER — Other Ambulatory Visit: Payer: Self-pay | Admitting: Psychiatry

## 2020-10-28 DIAGNOSIS — F411 Generalized anxiety disorder: Secondary | ICD-10-CM

## 2020-10-30 ENCOUNTER — Other Ambulatory Visit: Payer: Self-pay

## 2020-10-30 ENCOUNTER — Ambulatory Visit (INDEPENDENT_AMBULATORY_CARE_PROVIDER_SITE_OTHER): Admitting: Family Medicine

## 2020-10-30 ENCOUNTER — Encounter: Payer: Self-pay | Admitting: Family Medicine

## 2020-10-30 DIAGNOSIS — L732 Hidradenitis suppurativa: Secondary | ICD-10-CM

## 2020-10-30 HISTORY — DX: Hidradenitis suppurativa: L73.2

## 2020-10-30 LAB — POCT GLYCOSYLATED HEMOGLOBIN (HGB A1C): Hemoglobin A1C: 5.1 % (ref 4.0–5.6)

## 2020-10-30 MED ORDER — METFORMIN HCL ER 750 MG PO TB24: 750.0000 mg | ORAL_TABLET | Freq: Every day | ORAL | 0 refills | Status: DC

## 2020-10-30 MED ORDER — CLINDAMYCIN PHOSPHATE 1 % EX LOTN: TOPICAL_LOTION | Freq: Two times a day (BID) | CUTANEOUS | 2 refills | Status: DC

## 2020-10-30 NOTE — Patient Instructions (Signed)
It was great to see you!  Our plans for today:  - Take the metformin as prescribed. Let us know if you do not tolerate this.  - Also use the antibiotic lotion on affected areas.  - Come back if you don't see improvement in a few weeks.  Take care and seek immediate care sooner if you develop any concerns.   Dr. Ky Barban

## 2020-10-30 NOTE — Progress Notes (Signed)
    SUBJECTIVE:   CHIEF COMPLAINT / HPI:   HS - previously tried doxy x1 year, steroid creams - dxed about 3 years ago - has had current flare for about the last 3 weeks - flares typically under arms - now under breast, groin, thighs - mom and sister had HS as well.   OBJECTIVE:   BP 122/84   Pulse 94   Temp 98.2 F (36.8 C)   Resp 16   Ht 5\' 2"  (1.575 m)   Wt 240 lb 12.8 oz (109.2 kg)   SpO2 97%   BMI 44.04 kg/m   Gen: obese, in NAD Skin: several scattered boils under bilateral breasts, inner thighs, and groin without notable tracking.   ASSESSMENT/PLAN:   Hidradenitis suppurativa Previously tolerated doxy however will trial metformin given prior elevated blood glucose and obese status. Consider switching back to doxycycline if metformin not efficacious. Will also provide topical clindamycin. F/u if no better.      Myles Gip, DO

## 2020-10-30 NOTE — Assessment & Plan Note (Signed)
Previously tolerated doxy however will trial metformin given prior elevated blood glucose and obese status. Consider switching back to doxycycline if metformin not efficacious. Will also provide topical clindamycin. F/u if no better.

## 2020-11-06 ENCOUNTER — Telehealth (INDEPENDENT_AMBULATORY_CARE_PROVIDER_SITE_OTHER): Admitting: Psychiatry

## 2020-11-06 ENCOUNTER — Encounter: Payer: Self-pay | Admitting: Psychiatry

## 2020-11-06 ENCOUNTER — Other Ambulatory Visit: Payer: Self-pay

## 2020-11-06 DIAGNOSIS — F3342 Major depressive disorder, recurrent, in full remission: Secondary | ICD-10-CM | POA: Diagnosis not present

## 2020-11-06 DIAGNOSIS — F411 Generalized anxiety disorder: Secondary | ICD-10-CM | POA: Diagnosis not present

## 2020-11-06 DIAGNOSIS — F17200 Nicotine dependence, unspecified, uncomplicated: Secondary | ICD-10-CM | POA: Diagnosis not present

## 2020-11-06 MED ORDER — VENLAFAXINE HCL ER 150 MG PO CP24: 150.0000 mg | ORAL_CAPSULE | Freq: Every day | ORAL | 1 refills | Status: DC

## 2020-11-06 NOTE — Progress Notes (Signed)
Virtual Visit via Video Note  I connected with Olivia King on 11/06/20 at  2:00 PM EDT by a video enabled telemedicine application and verified that I am speaking with the correct person using two identifiers.  Location Provider Location : [facility] Patient Location : Car  Participants: Patient , Provider   I discussed the limitations of evaluation and management by telemedicine and the availability of in person appointments. The patient expressed understanding and agreed to proceed.   I discussed the assessment and treatment plan with the patient. The patient was provided an opportunity to ask questions and all were answered. The patient agreed with the plan and demonstrated an understanding of the instructions.   The patient was advised to call back or seek an in-person evaluation if the symptoms worsen or if the condition fails to improve as anticipated.                                                                    [facility] MD OP Progress Note  11/06/2020 2:20 PM Sonyia Muro  MRN:  [id number]  Chief Complaint:  Chief Complaint   Follow-up; Depression; Insomnia    HPI: Olivia King is a 35 year old female, employed, lives in [place], widowed, has a history of MDD, GAD, tobacco use disorder was evaluated by telemedicine today.  Patient today reports she went through an episode of depression a week ago and it lasted 3 to 4 days.  She felt sad and struggled with lack of motivation and low energy.  She however was able to come out of that phase and currently feels much better.  Currently denies any depressive symptoms.  She continues to be anxious, worries a lot all the time.  She reports her daughter has been able to get established with a provider and a therapist.  She reports her daughter is making progress and that is comforting.  Patient is compliant on the venlafaxine.  She denies side effects.  She reports sleep is overall okay.  She reports work is going  well.  Patient denies any suicidality, homicidality or perceptual disturbances.  Patient denies any other concerns today.  Visit Diagnosis:    ICD-10-CM   1. MDD (major depressive disorder), recurrent, in full remission ([facility])  F33.42 venlafaxine XR (EFFEXOR XR) 150 MG 24 hr capsule    2. GAD (generalized anxiety disorder)  F41.1 venlafaxine XR (EFFEXOR XR) 150 MG 24 hr capsule    3. Tobacco use disorder  F17.200       Past Psychiatric History: I have reviewed past psychiatric history from progress note on 10/10/2019.  Past trials of Lexapro, Vyvanse, Abilify, Paxil, Zoloft, Cymbalta  Past Medical History:  Past Medical History:  Diagnosis Date   Cigarette smoker    COVID-19 virus detected 03/21/2019   Family history of pancreatic cancer 08/2020   MyRisk neg except SDHA VUS; IBIS=9.2%/riskscore=7.3%   IUD contraception 02/06/2016   Placed January 22, 7034 by Ardeth Perfect, PA [facility]   Major depressive disorder, recurrent ([place]) 08/19/2015   zoloft made her cry; cymbalta made her too sleepy   Migraine headache     Past Surgical History:  Procedure Laterality Date   CESAREAN SECTION  2004 &2012   contraceptive implant      Family  Psychiatric History: Reviewed family psychiatric history from progress note on 10/10/2019  Family History:  Family History  Problem Relation Age of Onset   Hypertension Mother    Pancreatic cancer Mother 54   Hypertension Brother    Aneurysm Maternal Grandmother    Heart attack Maternal Grandfather    Hypertension Paternal Grandmother    Heart failure Sister     Social History: Reviewed social history from progress note on 10/10/2019 Social History   Socioeconomic History   Marital status: Widowed    Spouse name: Not on file   Number of children: 2   Years of education: Not on file   Highest education level: Associate degree: occupational, Hotel manager, or vocational program  Occupational History   Occupation: CMA    Comment: [employer]  Tobacco Use   Smoking status: Every Day    Years: 9.00    Types: Cigarettes    Start date: 03/05/2011   Smokeless tobacco: Never   Tobacco comments:    a pack last 2 days now - reported 05/10/2020  Vaping Use   Vaping Use: Some days   Substances: Flavoring  Substance and Sexual Activity   Alcohol use: Yes    Alcohol/week: 0.0 standard drinks   Drug use: No   Sexual activity: Yes    Partners: Male    Birth control/protection: I.U.D.    Comment: Patient has liletta IUD  Other Topics Concern   Not on file  Social History Narrative   She is a widow, he died 12-Jul-2009 while he was deployed.    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   Social Determinants of Health   Financial Resource Strain: Not on file  Food Insecurity: Not on file  Transportation Needs: Not on file  Physical Activity: Not on file  Stress: Not on file  Social Connections: Not on file    Allergies:  Allergies  Allergen Reactions   Imitrex [Sumatriptan] Anaphylaxis    Throat and tongue swelling    Penicillins Rash   Trintellix [Vortioxetine]     Tingling hands and blurred vision     Metabolic Disorder Labs: Lab Results  Component Value Date   HGBA1C 5.1 10/30/2020   MPG 100 05/10/2019   MPG 103 03/04/2018   No results found for: PROLACTIN Lab Results  Component Value Date   CHOL 178 04/09/2017   TRIG 94 04/09/2017   HDL 49 (L) 04/09/2017   CHOLHDL 3.6 04/09/2017   VLDL 11 03/19/2016   LDLCALC 110 (H) 04/09/2017   LDLCALC 98 03/19/2016   Lab Results  Component Value Date   TSH 2.17 05/10/2019   TSH 3.10 04/09/2017    Therapeutic Level Labs: No results found for: LITHIUM No results found for: VALPROATE No components found for:  CBMZ  Current Medications: Current Outpatient Medications  Medication Sig Dispense Refill   venlafaxine XR (EFFEXOR XR) 150 MG 24 hr capsule Take 1 capsule (150 mg total) by mouth daily with breakfast. 30 capsule 1   clindamycin (CLEOCIN T) 1 % lotion  Apply topically 2 (two) times daily. 60 mL 2   hydrOXYzine (ATARAX/VISTARIL) 10 MG tablet Take 1 tablet (10 mg total) by mouth at bedtime as needed. 90 tablet 0   ipratropium (ATROVENT) 0.03 % nasal spray ipratropium bromide 21 mcg (0.03 %) nasal spray     lamoTRIgine (LAMICTAL) 25 MG tablet TAKE 1 TABLET(25 MG) BY MOUTH DAILY 90 tablet 0   metFORMIN (GLUCOPHAGE-XR) 750 MG 24 hr tablet Take 1 tablet (750  mg total) by mouth daily with breakfast. 90 tablet 0   Multiple Vitamin (MULTI-VITAMINS) TABS Take by mouth.     norethindrone (MICRONOR) 0.35 MG tablet Take 1 tablet (0.35 mg total) by mouth daily. 84 tablet 3   potassium chloride SA (KLOR-CON) 20 MEQ tablet Take 1-2 tablets (20-40 mEq total) by mouth daily. 180 tablet 0   torsemide (DEMADEX) 20 MG tablet Take 2 tablets (40 mg total) by mouth every other day. PLEASE CALL OFFICE TO SCHEDULE AN APPOINTMENT FOR FURTHER REFILLS. 60 tablet 0   No current facility-administered medications for this visit.     Musculoskeletal: Strength & Muscle Tone:  UTA Gait & Station:  UTA Patient leans: N/A  Psychiatric Specialty Exam: Review of Systems  Psychiatric/Behavioral:  Positive for sleep disturbance (improving). The patient is nervous/anxious.   All other systems reviewed and are negative.  There were no vitals taken for this visit.There is no height or weight on file to calculate BMI.  General Appearance: Casual  Eye Contact:  Fair  Speech:  Clear and Coherent  Volume:  Normal  Mood:  Anxious  Affect:  Congruent  Thought Process:  Goal Directed and Descriptions of Associations: Intact  Orientation:  Full (Time, Place, and Person)  Thought Content: Logical   Suicidal Thoughts:  No  Homicidal Thoughts:  No  Memory:  Immediate;   Fair Recent;   Fair Remote;   Fair  Judgement:  Fair  Insight:  Fair  Psychomotor Activity:  Normal  Concentration:  Concentration: Fair and Attention Span: Fair  Recall:  [employer] of Knowledge: Fair   Language: Fair  Akathisia:  No  Handed:  Right  AIMS (if indicated): not done  Assets:  Communication Skills Desire for [place] Talents/Skills Transportation Vocational/Educational  ADL's:  Intact  Cognition: WNL  Sleep:   Improving   Screenings: GAD-7    Flowsheet Row Video Visit from 11/06/2020 in [facility] Video Visit from 09/25/2020 in [facility] Video Visit from 08/07/2020 in [facility] Video Visit from 07/15/2020 in [facility] from 05/14/2020 in [facility]  Total GAD-7 Score 14 13 4 19 6       PHQ2-9    Flowsheet Row Video Visit from 11/06/2020 in [facility] Visit from 10/30/2020 in [facility] Video Visit from 09/25/2020 in [place] Video Visit from 08/07/2020 in [facility] Video Visit from 07/15/2020 in [facility]  PHQ-2 Total Score 0 1 2 0 4  PHQ-9 Total Score -- 1 4 -- 14        Assessment and Plan: Olivia King is a 35 year old female, widowed, lives in [place], employed, has a history of MDD, GAD, eating disorder was evaluated by telemedicine today.  Patient is currently struggling with anxiety and will benefit from the following plan.  Plan MDD in remission Venlafaxine as prescribed Lamictal 25 mg p.o. daily  GAD-unstable Increase venlafaxine extended release to 150 mg p.o. daily Patient advised to establish care with a therapist.  Provided community resources  Tobacco use disorder-unstable Provided counseling. Discussed nicotine replacement. Failed Wellbutrin, cannot afford Chantix.  Follow-up in clinic in 8 weeks or sooner if needed.  This note was generated in part or whole with voice recognition software. Voice recognition  is usually quite accurate but there are transcription errors that can and very often do occur. I apologize for any typographical  errors that were not detected and corrected.       Ursula Alert, MD 11/07/2020, 10:58 AM

## 2020-11-27 ENCOUNTER — Telehealth: Payer: Self-pay

## 2020-11-27 NOTE — Telephone Encounter (Signed)
Patient is scheduled for 11/29/20 at 1:30 with [facility] for Liletta placement

## 2020-11-29 ENCOUNTER — Ambulatory Visit (INDEPENDENT_AMBULATORY_CARE_PROVIDER_SITE_OTHER): Admitting: Obstetrics & Gynecology

## 2020-11-29 ENCOUNTER — Other Ambulatory Visit: Payer: Self-pay

## 2020-11-29 ENCOUNTER — Encounter: Payer: Self-pay | Admitting: Obstetrics & Gynecology

## 2020-11-29 VITALS — BP 122/80 | Ht 63.0 in | Wt 241.0 lb

## 2020-11-29 DIAGNOSIS — N921 Excessive and frequent menstruation with irregular cycle: Secondary | ICD-10-CM | POA: Diagnosis not present

## 2020-11-29 DIAGNOSIS — Z30430 Encounter for insertion of intrauterine contraceptive device: Secondary | ICD-10-CM | POA: Diagnosis not present

## 2020-11-29 NOTE — Patient Instructions (Signed)
Intrauterine Device Insertion, Care After This sheet gives you information about how to care for yourself after your procedure. Your health care provider may also give you more specific instructions. If you have problems or questions, contact your health care provider. What can I expect after the procedure? After the procedure, it is common to have: Cramps and pain in the abdomen. Bleeding. It may be light or heavy. This may last for a few days. Lower back pain. Dizziness. Headaches. Nausea. Follow these instructions at home:  Before resuming sexual activity, check to make sure that you can feel the IUD string or strings. You should be able to feel the end of the string below the opening of your cervix. If your IUD string is in place, you may resume sexual activity. If you had a hormonal IUD inserted more than 7 days after your most recent period started, you will need to use a backup method of birth control for 7 days after IUD insertion. Ask your health care provider whether this applies to you. Continue to check that the IUD is still in place by feeling for the strings after every menstrual period, or once a month. An IUD will not protect you from sexually transmitted infections (STIs). Use methods to prevent the exchange of body fluids between partners (barrier protection) every time you have sex. Barrier protection can be used during oral, vaginal, or anal sex. Commonly used barrier methods include: Female condom. Female condom. Dental dam. Take over-the-counter and prescription medicines only as told by your health care provider. Keep all follow-up visits as told by your health care provider. This is important. Contact a health care provider if: You feel light-headed or weak. You have any of the following problems with your IUD string or strings: The string bothers or hurts you or your sexual partner. You cannot feel the string. The string has gotten longer. You can feel the IUD in  your vagina. You think you may be pregnant, or you miss your menstrual period. You think you may have a sexually transmitted infection (STI). Get help right away if: You have flu-like symptoms, such as tiredness (fatigue) and muscle aches. You have a fever and chills. You have bleeding that is heavier or lasts longer than a normal menstrual cycle. You have abnormal or bad-smelling discharge from your vagina. You develop abdominal pain that is new, is getting worse, or is not in the same area of earlier cramping and pain. You have pain during sexual activity. Summary After the procedure, it is common to have cramps and pain in the abdomen. It is also common to have light bleeding or heavier bleeding that is like your menstrual period. Continue to check that the IUD is still in place by feeling for the strings after every menstrual period, or once a month. Keep all follow-up visits as told by your health care provider. This is important. Contact your health care provider if you have problems with your IUD strings, such as the string getting longer or bothering you or your sexual partner. This information is not intended to replace advice given to you by your health care provider. Make sure you discuss any questions you have with your health care provider. Document Revised: 03/28/2019 Document Reviewed: 03/28/2019 Elsevier Patient Education  2022 Elsevier Inc.  

## 2020-11-29 NOTE — Progress Notes (Signed)
  IUD PROCEDURE NOTE:  Olivia King is a 35 y.o. G2P2 here for IUD insertion. No GYN concerns.  Last pap smear was normal.  Pt has used IUD in past, and now that the last one has been out a few months, has noticed worsening periods in heavy flow and discomfort.  She also reports no change in BV.  Still feels sex is main trigger.  Has tried boric acid but not regularly.  IUD Insertion Procedure Note Patient identified, informed consent performed, consent signed.   Discussed risks of irregular bleeding, cramping, infection, malpositioning or misplacement of the IUD outside the uterus which may require further procedure such as laparoscopy, risk of failure <1%. Time out was performed.  Urine pregnancy test negative.  A bimanual exam showed the uterus to be midposition.  Speculum placed in the vagina.  Cervix visualized.  Cleaned with Betadine x 2.  Grasped anteriorly with a single tooth tenaculum.  Uterus sounded to 8 cm.    Liletta  IUD placed per manufacturer's recommendations.  Strings trimmed to 3 cm. Tenaculum was removed, good hemostasis noted.  Patient tolerated procedure well.   Patient was given post-procedure instructions.  She was advised to have backup contraception for one week.  Patient was also asked to check IUD strings periodically and follow up in 4 weeks for IUD check.  Pt to monitor for s/sx BV and will start using Boric Acid twice weekly to see if helps reduce number of episodes.  Consider suppressive trigger therapy as next option.   Barnett Applebaum, MD, [facility], [facility] 11/29/2020  1:41 PM

## 2020-12-13 ENCOUNTER — Ambulatory Visit: Admitting: Family Medicine

## 2020-12-16 NOTE — Progress Notes (Addendum)
Name: Olivia King   MRN: [id number]    DOB: 1986/04/04   Date:12/17/2020       Progress Note  Subjective  Chief Complaint  Follow Up  HPI  Lymphedema: she went to vascular surgeon, and was advised to use compression stocking hoses and because of sob advised to see cardiologist. She saw Cardiologist - Dr Garen Lah   and was given Demadex to take one to two daily, cramps resolved with potassium supplementation, taking Demadex prn now and stable.    Morbid obesity: she states as a teenager she was in the 160 lbs range, but after her son was born in 2004 her weight stayed between 189-203 lbs , had her daughter in 2012 her weight went up to 270 lbs, but after her birth she was able to go back down to 203 lbs. She states in 2016 she took some otc diet pills and was going to the gym and her weight dropped about 40 lbs in a few months, she was back to 165 lbs. She told me back in April 2021 that  she can binge eat. She is able to avoid eating during the day - because she is so busy, but at night when upset with her children or not feeling well she eats to get comfort She states she feels out of control and is unable to stop eating, about twice a week, she feels very guilty afterwards. She states when she overeats she takes magnesium to help her have a bowel movement. She does not make herself vomit We started her on Vyvanse 07/2019 she has noticed that her mood improved and also decrease her urge to eat as much, but she has been off medication for over 6 months, her weight is going back up. 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   she states since COVID-19 her eyes are sore when she wakes up, also has photophobia, she has also noticed spinning sensation when she turns her head, also when she looks up . She also has tinnitus on right ear and associated  nausea .   MDD/GAD/eating disorder: she is under the care of Dr. Cherlyn Cushing but not currently seeing a therapist. She is thinking about changing jobs, not feeling appreciated, lack of motivation, feels anxious. She is compliant with medication, no longer on Vyvanse due to cost   Migraine headache:She is allergic to Imitrex, takes Excedrin migraine or Fioricet. She states migraine is described  As unilateral, right or left side, however this episode is a little different , not associated with aura, and pain is behind her right eye not above.  She was given Nurtec but no episodes since she got the prescription.   Hydradenitis suppurative: she is now on Metformin and clindamycin lotion and seems to help some, she asked about Humira, explained I can refer her to dermatologist when ready   Patient Active Problem List   Diagnosis Date Noted   Hidradenitis suppurativa 10/30/2020   Family history of pancreatic cancer 08/16/2020   BV (bacterial vaginosis) 08/16/2020   MDD (major depressive disorder), recurrent, in full remission ([place]) 07/15/2020   MDD (major depressive disorder), recurrent episode, moderate ([place]) 02/13/2020   Tobacco use disorder 02/13/2020   MDD (major depressive disorder), recurrent episode, mild ([place]) 10/10/2019   GAD (generalized anxiety disorder) 10/10/2019   Eating disorder 10/10/2019   Lymphedema 04/10/2019  Chronic venous insufficiency 04/10/2019   Leg pain 04/10/2019   Bilateral carpal tunnel syndrome 11/04/2018   Morbid obesity ([place]) 06/14/2017   Anxiety 03/24/2017   Contraception management 01/06/2016   Glucosuria 09/17/2015   Major depressive disorder, recurrent ([place]) 08/19/2015   Benign neoplasm of skin of nose 07/28/2015   Low serum vitamin D 12/24/2014   Migraine without aura 12/21/2014    Past Surgical History:  Procedure Laterality Date   CESAREAN SECTION  2004 &2012   contraceptive implant      Family History  Problem Relation Age of Onset    Hypertension Mother    Pancreatic cancer Mother 52   Hypertension Brother    Aneurysm Maternal Grandmother    Heart attack Maternal Grandfather    Hypertension Paternal Grandmother    Heart failure Sister     Social History   Tobacco Use   Smoking status: Every Day    Years: 9.00    Types: Cigarettes    Start date: 03/05/2011   Smokeless tobacco: Never   Tobacco comments:    a pack last 2 days now - reported 05/10/2020  Substance Use Topics   Alcohol use: Yes    Alcohol/week: 0.0 standard drinks     Current Outpatient Medications:    clindamycin (CLEOCIN T) 1 % lotion, Apply topically 2 (two) times daily., Disp: 60 mL, Rfl: 2   hydrOXYzine (ATARAX/VISTARIL) 10 MG tablet, Take 1 tablet (10 mg total) by mouth at bedtime as needed., Disp: 90 tablet, Rfl: 0   ipratropium (ATROVENT) 0.03 % nasal spray, ipratropium bromide 21 mcg (0.03 %) nasal spray, Disp: , Rfl:    lamoTRIgine (LAMICTAL) 25 MG tablet, TAKE 1 TABLET(25 MG) BY MOUTH DAILY, Disp: 90 tablet, Rfl: 0   metFORMIN (GLUCOPHAGE-XR) 750 MG 24 hr tablet, Take 1 tablet (750 mg total) by mouth daily with breakfast., Disp: 90 tablet, Rfl: 0   Multiple Vitamin (MULTI-VITAMINS) TABS, Take by mouth., Disp: , Rfl:    potassium chloride SA (KLOR-CON) 20 MEQ tablet, Take 1-2 tablets (20-40 mEq total) by mouth daily., Disp: 180 tablet, Rfl: 0   venlafaxine XR (EFFEXOR XR) 150 MG 24 hr capsule, Take 1 capsule (150 mg total) by mouth daily with breakfast., Disp: 30 capsule, Rfl: 1   norethindrone (MICRONOR) 0.35 MG tablet, Take 1 tablet (0.35 mg total) by mouth daily. (Patient not taking: Reported on 12/17/2020), Disp: 84 tablet, Rfl: 3   torsemide (DEMADEX) 20 MG tablet, Take 2 tablets (40 mg total) by mouth every other day. PLEASE CALL OFFICE TO SCHEDULE AN APPOINTMENT FOR FURTHER REFILLS., Disp: 60 tablet, Rfl: 0  Allergies  Allergen Reactions   Imitrex [Sumatriptan] Anaphylaxis    Throat and tongue swelling    Penicillins Rash    Trintellix [Vortioxetine]     Tingling hands and blurred vision     I personally reviewed active problem list, medication list, allergies, family history, social history, health maintenance with the patient/caregiver today.   ROS  Constitutional: Negative for fever, positive for  weight change.  Respiratory: Negative for cough and shortness of breath.   Cardiovascular: Negative for chest pain or palpitations.  Gastrointestinal: Negative for abdominal pain, no bowel changes.  Musculoskeletal: Negative for gait problem or joint swelling.  Skin: Negative for rash.  Neurological: Negative for dizziness or headache.  No other specific complaints in a complete review of systems (except as listed in HPI above).   Objective  Vitals:   12/17/20 0832  BP: 134/80  Pulse: (!) 106  Resp: 16  Temp: 98.2 F (36.8 C)  SpO2: 97%  Weight: 243 lb (110.2 kg)  Height: '5\' 3"'$  (1.6 m)    Body mass index is 43.05 kg/m.  Physical Exam  Constitutional: Patient appears well-developed and well-nourished. Obese  No distress.  HEENT: head atraumatic, normocephalic, pupils equal and reactive to light, she has photophobia , no nystagmus but got dizzy with head and eye movement, neck supple Cardiovascular: Normal rate, regular rhythm and normal heart sounds.  No murmur heard. No BLE edema. Pulmonary/Chest: Effort normal and breath sounds normal. No respiratory distress. Abdominal: Soft.  There is no tenderness. Psychiatric: Patient has a normal mood and affect. behavior is normal. Judgment and thought content normal.   Recent Results (from the past 2160 hour(s))  POCT HgB A1C     Status: Normal   Collection Time: 10/30/20  9:34 AM  Result Value Ref Range   Hemoglobin A1C 5.1 4.0 - 5.6 %   HbA1c POC (<> result, manual entry)     HbA1c, POC (prediabetic range)     HbA1c, POC (controlled diabetic range)       PHQ2/9: Depression screen [facility] 2/9 12/17/2020 10/30/2020 06/17/2020 03/13/2020 12/18/2019   Decreased Interest '1 1 1 '$ 0 2  Down, Depressed, Hopeless 2 0 0 0 1  PHQ - 2 Score '3 1 1 '$ 0 3  Altered sleeping 3 0 0 - 2  Tired, decreased energy 3 0 3 - 3  Change in appetite 0 0 0 - 0  Feeling bad or failure about yourself  1 0 0 - 2  Trouble concentrating 1 0 0 - 0  Moving slowly or fidgety/restless 0 0 0 - 2  Suicidal thoughts 0 0 0 - 0  PHQ-9 Score '11 1 4 '$ - 12  Difficult doing work/chores - Not difficult at all - - Very difficult  Some encounter information is confidential and restricted. Go to Review Flowsheets activity to see all data.  Some recent data might be hidden    phq 9 is positive   Fall Risk: Fall Risk  12/17/2020 10/30/2020 06/17/2020 03/13/2020 12/18/2019  Falls in the past year? 0 0 0 0 0  Number falls in past yr: 0 0 0 0 0  Injury with Fall? 0 0 0 0 1  Risk for fall due to : No Fall Risks - - - -  Follow up Falls prevention discussed - - - -     Functional Status Survey: Is the patient deaf or have difficulty hearing?: Yes Does the patient have difficulty seeing, even when wearing glasses/contacts?: Yes Does the patient have difficulty concentrating, remembering, or making decisions?: Yes Does the patient have difficulty walking or climbing stairs?: No Does the patient have difficulty dressing or bathing?: No Does the patient have difficulty doing errands alone such as visiting a doctor's office or shopping?: No    Assessment & Plan  1. Lymphedema   2. Morbid obesity ([place])  Discussed with the patient the risk posed by an increased BMI. Discussed importance of portion control, calorie counting and at least 150 minutes of physical activity weekly. Avoid sweet beverages and drink more water. Eat at least 6 servings of fruit and vegetables daily    3. Migraine without aura and without status migrainosus, not intractable   4. GAD (generalized anxiety disorder)   5. Moderate episode of recurrent major depressive disorder ([place])  Keep follow up with  psychiatrist, needs a therapist also   6. Binge eating disorder  7. Leukocytosis, unspecified type  - CBC with Differential/Platelet  8. Tachycardia  - TSH  9. Long-term use of high-risk medication  - CBC with Differential/Platelet - COMPLETE METABOLIC PANEL WITH GFR  10. Dyslipidemia  - Lipid panel  11. 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 Hidradenitis suppurativa  - Ambulatory referral to Dermatology  13. Insulin resistance  - metFORMIN (GLUCOPHAGE-XR) 750 MG 24 hr tablet; Take 1 tablet (750 mg total) by mouth daily with breakfast.  Dispense: 90 tablet; Refill: 1

## 2020-12-17 ENCOUNTER — Ambulatory Visit (INDEPENDENT_AMBULATORY_CARE_PROVIDER_SITE_OTHER): Admitting: Family Medicine

## 2020-12-17 ENCOUNTER — Encounter: Payer: Self-pay | Admitting: Family Medicine

## 2020-12-17 ENCOUNTER — Other Ambulatory Visit: Payer: Self-pay

## 2020-12-17 DIAGNOSIS — G43009 Migraine without aura, not intractable, without status migrainosus: Secondary | ICD-10-CM

## 2020-12-17 DIAGNOSIS — R000 Tachycardia, unspecified: Secondary | ICD-10-CM

## 2020-12-17 DIAGNOSIS — Z23 Encounter for immunization: Secondary | ICD-10-CM

## 2020-12-17 DIAGNOSIS — F411 Generalized anxiety disorder: Secondary | ICD-10-CM | POA: Diagnosis not present

## 2020-12-17 DIAGNOSIS — F5081 Binge eating disorder: Secondary | ICD-10-CM

## 2020-12-17 DIAGNOSIS — F50819 Binge eating disorder, unspecified: Secondary | ICD-10-CM

## 2020-12-17 DIAGNOSIS — E88819 Insulin resistance, unspecified: Secondary | ICD-10-CM

## 2020-12-17 DIAGNOSIS — F331 Major depressive disorder, recurrent, moderate: Secondary | ICD-10-CM

## 2020-12-17 DIAGNOSIS — R2990 Unspecified symptoms and signs involving the nervous system: Secondary | ICD-10-CM

## 2020-12-17 DIAGNOSIS — L732 Hidradenitis suppurativa: Secondary | ICD-10-CM

## 2020-12-17 DIAGNOSIS — D72829 Elevated white blood cell count, unspecified: Secondary | ICD-10-CM

## 2020-12-17 DIAGNOSIS — Z79899 Other long term (current) drug therapy: Secondary | ICD-10-CM

## 2020-12-17 DIAGNOSIS — E785 Hyperlipidemia, unspecified: Secondary | ICD-10-CM

## 2020-12-17 DIAGNOSIS — I890 Lymphedema, not elsewhere classified: Secondary | ICD-10-CM

## 2020-12-17 DIAGNOSIS — U099 Post covid-19 condition, unspecified: Secondary | ICD-10-CM

## 2020-12-17 DIAGNOSIS — E8881 Metabolic syndrome: Secondary | ICD-10-CM

## 2020-12-17 MED ORDER — TORSEMIDE 20 MG PO TABS: 40.0000 mg | ORAL_TABLET | ORAL | 0 refills | Status: DC

## 2020-12-17 MED ORDER — METFORMIN HCL ER 750 MG PO TB24: 750.0000 mg | ORAL_TABLET | Freq: Every day | ORAL | 1 refills | Status: DC

## 2020-12-18 LAB — CBC WITH DIFFERENTIAL/PLATELET
Absolute Monocytes: 901 cells/uL (ref 200–950)
Basophils Absolute: 70 cells/uL (ref 0–200)
Basophils Relative: 0.6 %
Eosinophils Absolute: 94 cells/uL (ref 15–500)
Eosinophils Relative: 0.8 %
HCT: 36.3 % (ref 35.0–45.0)
Hemoglobin: 12 g/dL (ref 11.7–15.5)
Lymphs Abs: 2480 cells/uL (ref 850–3900)
MCH: 30.5 pg (ref 27.0–33.0)
MCHC: 33.1 g/dL (ref 32.0–36.0)
MCV: 92.4 fL (ref 80.0–100.0)
MPV: 10.2 fL (ref 7.5–12.5)
Monocytes Relative: 7.7 %
Neutro Abs: 8155 cells/uL — ABNORMAL HIGH (ref 1500–7800)
Neutrophils Relative %: 69.7 %
Platelets: 328 10*3/uL (ref 140–400)
RBC: 3.93 10*6/uL (ref 3.80–5.10)
RDW: 12.9 % (ref 11.0–15.0)
Total Lymphocyte: 21.2 %
WBC: 11.7 10*3/uL — ABNORMAL HIGH (ref 3.8–10.8)

## 2020-12-18 LAB — COMPLETE METABOLIC PANEL WITH GFR
AG Ratio: 1.2 (calc) (ref 1.0–2.5)
ALT: 15 U/L (ref 6–29)
AST: 16 U/L (ref 10–30)
Albumin: 3.7 g/dL (ref 3.6–5.1)
Alkaline phosphatase (APISO): 56 U/L (ref 31–125)
BUN: 7 mg/dL (ref 7–25)
CO2: 24 mmol/L (ref 20–32)
Calcium: 9.1 mg/dL (ref 8.6–10.2)
Chloride: 108 mmol/L (ref 98–110)
Creat: 0.87 mg/dL (ref 0.50–0.97)
Globulin: 3.1 g/dL (calc) (ref 1.9–3.7)
Glucose, Bld: 90 mg/dL (ref 65–99)
Potassium: 4.4 mmol/L (ref 3.5–5.3)
Sodium: 139 mmol/L (ref 135–146)
Total Bilirubin: 0.4 mg/dL (ref 0.2–1.2)
Total Protein: 6.8 g/dL (ref 6.1–8.1)
eGFR: 89 mL/min/{1.73_m2} (ref >=60)

## 2020-12-18 LAB — LIPID PANEL
Cholesterol: 179 mg/dL (ref <200)
HDL: 47 mg/dL — ABNORMAL LOW (ref >=50)
LDL Cholesterol (Calc): 116 mg/dL (calc) — ABNORMAL HIGH
Non-HDL Cholesterol (Calc): 132 mg/dL (calc) — ABNORMAL HIGH (ref <130)
Total CHOL/HDL Ratio: 3.8 (calc) (ref <5.0)
Triglycerides: 69 mg/dL (ref <150)

## 2020-12-18 LAB — TSH: TSH: 2.49 mIU/L

## 2020-12-18 NOTE — Telephone Encounter (Signed)
Noted. Liletta rcvd/charged 11/29/2020

## 2020-12-28 ENCOUNTER — Other Ambulatory Visit: Payer: Self-pay | Admitting: Psychiatry

## 2020-12-28 DIAGNOSIS — F3342 Major depressive disorder, recurrent, in full remission: Secondary | ICD-10-CM

## 2020-12-28 DIAGNOSIS — F411 Generalized anxiety disorder: Secondary | ICD-10-CM

## 2020-12-30 ENCOUNTER — Encounter: Payer: Self-pay | Admitting: Obstetrics & Gynecology

## 2020-12-30 ENCOUNTER — Other Ambulatory Visit: Payer: Self-pay

## 2020-12-30 ENCOUNTER — Ambulatory Visit (INDEPENDENT_AMBULATORY_CARE_PROVIDER_SITE_OTHER): Admitting: Obstetrics & Gynecology

## 2020-12-30 VITALS — BP 118/80 | Ht 63.0 in | Wt 240.0 lb

## 2020-12-30 DIAGNOSIS — Z30431 Encounter for routine checking of intrauterine contraceptive device: Secondary | ICD-10-CM | POA: Diagnosis not present

## 2020-12-30 NOTE — Progress Notes (Signed)
  History of Present Illness:  Olivia King is a 35 y.o. that had a  Liletta  IUD placed approximately 4 weeks ago. Since that time, she states that she has had no bleeding and pain  PMHx: She  has a past medical history of Cigarette smoker, COVID-19 virus detected (03/21/2019), Family history of pancreatic cancer (08/2020), IUD contraception (02/06/2016), Major depressive disorder, recurrent ([place]) (08/19/2015), and Migraine headache. Also,  has a past surgical history that includes contraceptive implant and Cesarean section (2004 &2012)., family history includes Aneurysm in her maternal grandmother; Heart attack in her maternal grandfather; Heart failure in her sister; Hypertension in her brother, mother, and paternal grandmother; Pancreatic cancer (age of onset: 64) in her mother.,  reports that she has been smoking cigarettes. She started smoking about 9 years ago. She has never used smokeless tobacco. She reports current alcohol use. She reports that she does not use drugs. Current Meds  Medication Sig   clindamycin (CLEOCIN T) 1 % lotion Apply topically 2 (two) times daily.   hydrOXYzine (ATARAX/VISTARIL) 10 MG tablet Take 1 tablet (10 mg total) by mouth at bedtime as needed.   ipratropium (ATROVENT) 0.03 % nasal spray ipratropium bromide 21 mcg (0.03 %) nasal spray   lamoTRIgine (LAMICTAL) 25 MG tablet TAKE 1 TABLET(25 MG) BY MOUTH DAILY   metFORMIN (GLUCOPHAGE-XR) 750 MG 24 hr tablet Take 1 tablet (750 mg total) by mouth daily with breakfast.   Multiple Vitamin (MULTI-VITAMINS) TABS Take by mouth.   potassium chloride SA (KLOR-CON) 20 MEQ tablet Take 1-2 tablets (20-40 mEq total) by mouth daily.   torsemide (DEMADEX) 20 MG tablet Take 2 tablets (40 mg total) by mouth every other day.   venlafaxine XR (EFFEXOR XR) 150 MG 24 hr capsule Take 1 capsule (150 mg total) by mouth daily with breakfast.  .  Also, is allergic to imitrex [sumatriptan], penicillins, and trintellix  [vortioxetine]..  Review of Systems  All other systems reviewed and are negative.  Physical Exam:  BP 118/80   Ht '5\' 3"'$  (1.6 m)   Wt 240 lb (108.9 kg)   LMP 12/24/2020   BMI 42.51 kg/m  Body mass index is 42.51 kg/m. Constitutional: Well nourished, well developed female in no acute distress.  Abdomen: diffusely non tender to palpation, non distended, and no masses, hernias Neuro: Grossly intact Psych:  Normal mood and affect.    Pelvic exam:  Two IUD strings present seen coming from the cervical os. EGBUS, vaginal vault and cervix: within normal limits  Assessment: IUD strings present in proper location; pt doing well  Plan: She was told to continue to use barrier contraception, in order to prevent any STIs, and to take a home pregnancy test or call us if she ever thinks she may be pregnant, and that her IUD expires in 7 years.  She was amenable to this plan and we will see her back in 1 year/PRN.  A total of 20 minutes were spent face-to-face with the patient as well as preparation, review, communication, and documentation during this encounter.   Barnett Applebaum, MD, [facility], [facility] 12/30/2020  4:26 PM

## 2021-01-08 ENCOUNTER — Encounter: Payer: Self-pay | Admitting: Psychiatry

## 2021-01-08 ENCOUNTER — Telehealth (INDEPENDENT_AMBULATORY_CARE_PROVIDER_SITE_OTHER): Admitting: Psychiatry

## 2021-01-08 ENCOUNTER — Other Ambulatory Visit: Payer: Self-pay

## 2021-01-08 DIAGNOSIS — F17200 Nicotine dependence, unspecified, uncomplicated: Secondary | ICD-10-CM

## 2021-01-08 DIAGNOSIS — F411 Generalized anxiety disorder: Secondary | ICD-10-CM | POA: Diagnosis not present

## 2021-01-08 DIAGNOSIS — F3342 Major depressive disorder, recurrent, in full remission: Secondary | ICD-10-CM

## 2021-01-08 MED ORDER — VENLAFAXINE HCL ER 75 MG PO CP24: 75.0000 mg | ORAL_CAPSULE | Freq: Every day | ORAL | 0 refills | Status: DC

## 2021-01-08 NOTE — Progress Notes (Signed)
Virtual Visit via Video Note  I connected with [name] on 01/08/21 at  4:00 PM EDT by a video enabled telemedicine application and verified that I am speaking with the correct person using two identifiers.  Location Provider Location : [facility] Patient Location : [place]  Participants: Patient , Provider   I discussed the limitations of evaluation and management by telemedicine and the availability of in person appointments. The patient expressed understanding and agreed to proceed.    I discussed the assessment and treatment plan with the patient. The patient was provided an opportunity to ask questions and all were answered. The patient agreed with the plan and demonstrated an understanding of the instructions.   The patient was advised to call back or seek an in-person evaluation if the symptoms worsen or if the condition fails to improve as anticipated.  [place] MD OP Progress Note  01/08/2021 4:17 PM Kemyah Buser  MRN:  [id number]  Chief Complaint:  Chief Complaint   Follow-up; Anxiety; Depression    HPI: Olivia King is a 35 year old female, employed, lives in [place], widowed, has a history of MDD, GAD, tobacco use disorder was evaluated by telemedicine today.  Patient today reports she is overall currently doing well with regards to her mood.  Her anxiety has improved.  She reports she had to go back to the venlafaxine 75 mg since when she started the 150 mg she started having side effects of sweats.  She had some 75 mg tablets at home ,so she tried it for a few days and that helped a lot with the side effects.  Patient reports she otherwise is doing well.  Her job is busy however she has been coping okay.  She has been cutting back on smoking cigarettes.  Patient denies any suicidality, homicidality or perceptual disturbances.  She continues to look for a therapist however has not been successful in finding 1.  Patient denies any other concerns  today.  Visit Diagnosis:    ICD-10-CM   1. MDD (major depressive disorder), recurrent, in full remission ([place])  F33.42 venlafaxine XR (EFFEXOR XR) 75 MG 24 hr capsule    2. GAD (generalized anxiety disorder)  F41.1 venlafaxine XR (EFFEXOR XR) 75 MG 24 hr capsule    3. Tobacco use disorder  F17.200       Past Psychiatric History: Reviewed past psychiatric history from progress note on 10/10/2019.  Past trials of Lexapro, Vyvanse, Abilify, Paxil, Zoloft, Cymbalta  Past Medical History:  Past Medical History:  Diagnosis Date   Cigarette smoker    COVID-19 virus detected 03/21/2019   Family history of pancreatic cancer 08/2020   MyRisk neg except SDHA VUS; IBIS=9.2%/riskscore=7.3%   IUD contraception 02/06/2016   Placed January 23, 5731 by Ardeth Perfect, PA [facility]   Major depressive disorder, recurrent ([place]) 08/19/2015   zoloft made her cry; cymbalta made her too sleepy   Migraine headache     Past Surgical History:  Procedure Laterality Date   CESAREAN SECTION  2004 &2012   contraceptive implant      Family Psychiatric History: Reviewed family psychiatric history from progress note on 10/10/2019  Family History:  Family History  Problem Relation Age of Onset   Hypertension Mother    Pancreatic cancer Mother 88   Hypertension Brother    Aneurysm Maternal Grandmother    Heart attack Maternal Grandfather    Hypertension Paternal Grandmother    Heart failure Sister     Social History: Reviewed social history from  progress note on 10/10/2019 Social History   Socioeconomic History   Marital status: Widowed    Spouse name: Not on file   Number of children: 2   Years of education: Not on file   Highest education level: Associate degree: occupational, Hotel manager, or vocational program  Occupational History   Occupation: CMA    Comment: [employer]  Tobacco Use   Smoking status: Every Day    Years: 9.00    Types: Cigarettes    Start date: 03/05/2011   Smokeless  tobacco: Never   Tobacco comments:    a pack last 2 days now - reported 05/10/2020  Vaping Use   Vaping Use: Some days   Substances: Flavoring  Substance and Sexual Activity   Alcohol use: Yes    Alcohol/week: 0.0 standard drinks   Drug use: No   Sexual activity: Yes    Partners: Male    Birth control/protection: I.U.D.    Comment: Patient has liletta IUD  Other Topics Concern   Not on file  Social History Narrative   She is a widow, he died 2009/07/09 while he was deployed.    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 5631   Social Determinants of Health   Financial Resource Strain: Not on file  Food Insecurity: Not on file  Transportation Needs: Not on file  Physical Activity: Not on file  Stress: Not on file  Social Connections: Not on file    Allergies:  Allergies  Allergen Reactions   Imitrex [Sumatriptan] Anaphylaxis    Throat and tongue swelling    Penicillins Rash   Trintellix [Vortioxetine]     Tingling hands and blurred vision     Metabolic Disorder Labs: Lab Results  Component Value Date   HGBA1C 5.1 10/30/2020   MPG 100 05/10/2019   MPG 103 03/04/2018   No results found for: PROLACTIN Lab Results  Component Value Date   CHOL 179 12/17/2020   TRIG 69 12/17/2020   HDL 47 (L) 12/17/2020   CHOLHDL 3.8 12/17/2020   VLDL 11 03/19/2016   LDLCALC 116 (H) 12/17/2020   LDLCALC 110 (H) 04/09/2017   Lab Results  Component Value Date   TSH 2.49 12/17/2020   TSH 2.17 05/10/2019    Therapeutic Level Labs: No results found for: LITHIUM No results found for: VALPROATE No components found for:  CBMZ  Current Medications: Current Outpatient Medications  Medication Sig Dispense Refill   venlafaxine XR (EFFEXOR XR) 75 MG 24 hr capsule Take 1 capsule (75 mg total) by mouth daily with breakfast. 90 capsule 0   clindamycin (CLEOCIN T) 1 % lotion Apply topically 2 (two) times daily. 60 mL 2   hydrOXYzine (ATARAX/VISTARIL) 10 MG tablet Take 1 tablet (10 mg total) by  mouth at bedtime as needed. 90 tablet 0   ipratropium (ATROVENT) 0.03 % nasal spray ipratropium bromide 21 mcg (0.03 %) nasal spray     lamoTRIgine (LAMICTAL) 25 MG tablet TAKE 1 TABLET(25 MG) BY MOUTH DAILY 90 tablet 0   metFORMIN (GLUCOPHAGE-XR) 750 MG 24 hr tablet Take 1 tablet (750 mg total) by mouth daily with breakfast. 90 tablet 1   Multiple Vitamin (MULTI-VITAMINS) TABS Take by mouth.     potassium chloride SA (KLOR-CON) 20 MEQ tablet Take 1-2 tablets (20-40 mEq total) by mouth daily. 180 tablet 0   torsemide (DEMADEX) 20 MG tablet Take 2 tablets (40 mg total) by mouth every other day. 60 tablet 0   No current facility-administered medications for this visit.  Musculoskeletal: Strength & Muscle Tone:  UTA Gait & Station:  Seated Patient leans: N/A  Psychiatric Specialty Exam: Review of Systems  Psychiatric/Behavioral:  The patient is nervous/anxious.   All other systems reviewed and are negative.  Last menstrual period 12/24/2020.There is no height or weight on file to calculate BMI.  General Appearance: Casual  Eye Contact:  Good  Speech:  Clear and Coherent  Volume:  Normal  Mood:  Anxious  Affect:  Congruent  Thought Process:  Goal Directed and Descriptions of Associations: Intact  Orientation:  Full (Time, Place, and Person)  Thought Content: Logical   Suicidal Thoughts:  No  Homicidal Thoughts:  No  Memory:  Immediate;   Fair Recent;   Fair Remote;   Fair  Judgement:  Fair  Insight:  Fair  Psychomotor Activity:  Normal  Concentration:  Concentration: Fair and Attention Span: Fair  Recall:  [employer] of Knowledge: Fair  Language: Fair  Akathisia:  No  Handed:  Right  AIMS (if indicated): done  Assets:  Communication Skills Desire for Improvement Housing Social Support Talents/Skills  ADL's:  Intact  Cognition: WNL  Sleep:  Fair   Screenings: GAD-7    Flowsheet Row Video Visit from 11/06/2020 in [place] [facility]  Visit from 09/25/2020 in [place] Video Visit from 08/07/2020 in [place] Video Visit from 07/15/2020 in [place] from 05/14/2020 in [facility]  Total GAD-7 Score 14 13 4 19 6       PHQ2-9    [place] Visit from 12/17/2020 in [facility] Video Visit from 11/06/2020 in [place] Office Visit from 10/30/2020 in [facility] Video Visit from 09/25/2020 in [place] Video Visit from 08/07/2020 in [place]  PHQ-2 Total Score 3 0 1 2 0  PHQ-9 Total Score 11 -- 1 4 --        Assessment and Plan: Olivia King is a 35 year old female, widowed, lives in [place], employed, has a history of MDD, GAD, eating disorder was evaluated by telemedicine today.  Patient is currently improving, does report adverse side effects to higher dosage of venlafaxine.  Discussed plan as noted below.  Plan MDD in remission Venlafaxine as prescribed Lamotrigine 25 mg p.o. daily AIMS - 0  GAD-improving Venlafaxine at reduced dosage of 75 mg p.o. daily.  Dose reduced used to side effects. Patient advised to establish care with a therapist-provided community resources.  Tobacco use disorder-improving Provided counseling for 2 minutes.  Patient has failed Wellbutrin, cannot afford Chantix.  Follow-up in clinic in 1 month or sooner if needed.  This note was generated in part or whole with voice recognition software. Voice recognition is usually quite accurate but there are transcription errors that can and very often do occur. I apologize for any typographical errors that were not detected and corrected.       Ursula Alert, MD 01/09/2021, 9:33 AM

## 2021-01-29 ENCOUNTER — Other Ambulatory Visit: Payer: Self-pay | Admitting: Family Medicine

## 2021-01-29 DIAGNOSIS — E8881 Metabolic syndrome: Secondary | ICD-10-CM

## 2021-01-30 NOTE — Telephone Encounter (Signed)
[place] called and spoke to [employer], [facility] An about the refill(s) Metformin XR 72m requested. Advised it was sent on 12/17/20 #90/1 refill(s). She stated she has this medication on file and is being processed for refill. I advised a request for Metformin ER 7557mand she stated she has Metformin XR 75071mn file and that was the same medication.  Will refuse this request.    Requested Prescriptions  Pending Prescriptions Disp Refills   metFORMIN (GLUCOPHAGE-XR) 750 MG 24 hr tablet [Pharmacy Med Name: METFORMIN ER 750MG 24HR TABS] 90 tablet 1    Sig: TAKE 1 TABLET(750 MG) BY MOUTH DAILY WITH BREAKFAST     Endocrinology:  Diabetes - Biguanides Failed - 01/29/2021 11:11 PM      Failed - AA eGFR in normal range and within 360 days    GFR, Est African American  Date Value Ref Range Status  05/10/2019 99 > OR = 60 mL/min/1.37m78mnal   GFR calc Af Amer  Date Value Ref Range Status  08/25/2019 >60 >60 mL/min Final   GFR, Est Non African American  Date Value Ref Range Status  05/10/2019 86 > OR = 60 mL/min/1.37m282mal   GFR calc non Af Amer  Date Value Ref Range Status  08/25/2019 >60 >60 mL/min Final   eGFR  Date Value Ref Range Status  12/17/2020 89 > OR = 60 mL/min/1.37m2 53ml    Comment:    The eGFR is based on the CKD-EPI 2021 equation. To calculate  the new eGFR from a previous Creatinine or Cystatin C result, go to https://www.kidney.org/professionals/ kdoqi/gfr%5Fcalculator           Passed - Cr in normal range and within 360 days    Creat  Date Value Ref Range Status  12/17/2020 0.87 0.50 - 0.97 mg/dL Final   Creatinine, Urine  Date Value Ref Range Status  05/10/2019 166 20 - 275 mg/dL Final          Passed - HBA1C is between 0 and 7.9 and within 180 days    Hemoglobin A1C  Date Value Ref Range Status  10/30/2020 5.1 4.0 - 5.6 % Final   Hgb A1c MFr Bld  Date Value Ref Range Status  05/10/2019 5.1 <5.7 % of total Hgb Final    Comment:    For the  purpose of screening for the presence of diabetes: . <5.7%       Consistent with the absence of diabetes 5.7-6.4%    Consistent with increased risk for diabetes             (prediabetes) > or =6.5%  Consistent with diabetes . This assay result is consistent with a decreased risk of diabetes. . Currently, no consensus exists regarding use of hemoglobin A1c for diagnosis of diabetes in children. . According to American Diabetes Association (ADA) guidelines, hemoglobin A1c <7.0% represents optimal control in non-pregnant diabetic patients. Different metrics may apply to specific patient populations.  Standards of Medical Care in Diabetes(ADA). .     Renella Cunasid encounter within last 6 months    Recent Outpatient Visits           1 month ago [facility] months ago [facility]bid [facility] 7 months ago Migraine without aura and without status migrainosus, not intractable   [facility]  [facility]le S[facility], MD   10 months ago Vaginal odor   [facility] [place], Drue Stager, MD   1 year ago Moderate episode of recurrent major depressive disorder [facility])   [facility] Steele Sizer, MD       Future Appointments             In 4 months Ancil Boozer, Drue Stager, MD [facility], [place]   In 4 months [facility], [place], MD [place]

## 2021-02-06 ENCOUNTER — Telehealth (INDEPENDENT_AMBULATORY_CARE_PROVIDER_SITE_OTHER): Admitting: Psychiatry

## 2021-02-06 ENCOUNTER — Other Ambulatory Visit: Payer: Self-pay

## 2021-02-06 ENCOUNTER — Encounter: Payer: Self-pay | Admitting: Psychiatry

## 2021-02-06 DIAGNOSIS — F3342 Major depressive disorder, recurrent, in full remission: Secondary | ICD-10-CM

## 2021-02-06 DIAGNOSIS — F17200 Nicotine dependence, unspecified, uncomplicated: Secondary | ICD-10-CM

## 2021-02-06 DIAGNOSIS — F411 Generalized anxiety disorder: Secondary | ICD-10-CM

## 2021-02-06 NOTE — Progress Notes (Signed)
Virtual Visit via Video Note  I connected with Olivia King on 02/06/21 at  4:00 PM EDT by a video enabled telemedicine application and verified that I am speaking with the correct person using two identifiers.  Location Provider Location : [facility] Patient Location : Home  Participants: Patient , Provider    I discussed the limitations of evaluation and management by telemedicine and the availability of in person appointments. The patient expressed understanding and agreed to proceed.  I discussed the assessment and treatment plan with the patient. The patient was provided an opportunity to ask questions and all were answered. The patient agreed with the plan and demonstrated an understanding of the instructions.   The patient was advised to call back or seek an in-person evaluation if the symptoms worsen or if the condition fails to improve as anticipated.    [place] MD OP Progress Note  02/06/2021 4:26 PM Olivia King  MRN:  [id number]  Chief Complaint:  Chief Complaint   Follow-up; Depression; Insomnia    HPI: Olivia King is a 35 year old female, employed, lives in [place], widowed, has a history of MDD, GAD, tobacco use disorder was evaluated by telemedicine today.  Patient today reports she continues to have multiple psychosocial stressors including her daughter's health problems, job related stressors.  She is interested in psychotherapy however has not been able to schedule an appointment with a therapist yet.  She reports she has reached out to a few therapist and is possibly planning to schedule an appointment with Ms. Marjie Skiff.  She reports she is compliant on her medications as prescribed.  She is currently tolerating the venlafaxine 75 mg daily.  Denies side effects to the Lamictal.  Reports sleep is overall okay.  She is currently interested in cutting back on smoking cigarettes.  Provided counseling.  Denies any suicidality, homicidality or perceptual  disturbances.  Patient denies any other concerns today.  Visit Diagnosis:    ICD-10-CM   1. MDD (major depressive disorder), recurrent, in full remission ([place])  F33.42     2. GAD (generalized anxiety disorder)  F41.1     3. Tobacco use disorder  F17.200       Past Psychiatric History: Reviewed past psychiatric history from progress note on 10/10/2019.  Past trials of Lexapro, Vyvanse, Abilify, Paxil, Zoloft, Cymbalta  Past Medical History:  Past Medical History:  Diagnosis Date   Cigarette smoker    COVID-19 virus detected 03/21/2019   Family history of pancreatic cancer 08/2020   MyRisk neg except SDHA VUS; IBIS=9.2%/riskscore=7.3%   IUD contraception 02/06/2016   Placed January 23, 931 by Ardeth Perfect, PA [facility]   Major depressive disorder, recurrent ([place]) 08/19/2015   zoloft made her cry; cymbalta made her too sleepy   Migraine headache     Past Surgical History:  Procedure Laterality Date   CESAREAN SECTION  2004 &2012   contraceptive implant      Family Psychiatric History: Reviewed family psychiatric history from progress note on 10/10/2019  Family History:  Family History  Problem Relation Age of Onset   Hypertension Mother    Pancreatic cancer Mother 39   Hypertension Brother    Aneurysm Maternal Grandmother    Heart attack Maternal Grandfather    Hypertension Paternal Grandmother    Heart failure Sister     Social History: Reviewed social history from progress note on 10/10/2019 Social History   Socioeconomic History   Marital status: Widowed    Spouse name: Not on file  Number of children: 2   Years of education: Not on file   Highest education level: Associate degree: occupational, Hotel manager, or vocational program  Occupational History   Occupation: CMA    Comment: [employer]  Tobacco Use   Smoking status: Every Day    Years: 9.00    Types: Cigarettes    Start date: 03/05/2011   Smokeless tobacco: Never   Tobacco comments:    a pack  last 2 days now - reported 05/10/2020  Vaping Use   Vaping Use: Some days   Substances: Flavoring  Substance and Sexual Activity   Alcohol use: Yes    Alcohol/week: 0.0 standard drinks   Drug use: No   Sexual activity: Yes    Partners: Male    Birth control/protection: I.U.D.    Comment: Patient has liletta IUD  Other Topics Concern   Not on file  Social History Narrative   She is a widow, he died 07/12/2009 while he was deployed.    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   Social Determinants of Health   Financial Resource Strain: Not on file  Food Insecurity: Not on file  Transportation Needs: Not on file  Physical Activity: Not on file  Stress: Not on file  Social Connections: Not on file    Allergies:  Allergies  Allergen Reactions   Imitrex [Sumatriptan] Anaphylaxis    Throat and tongue swelling    Penicillins Rash   Trintellix [Vortioxetine]     Tingling hands and blurred vision     Metabolic Disorder Labs: Lab Results  Component Value Date   HGBA1C 5.1 10/30/2020   MPG 100 05/10/2019   MPG 103 03/04/2018   No results found for: PROLACTIN Lab Results  Component Value Date   CHOL 179 12/17/2020   TRIG 69 12/17/2020   HDL 47 (L) 12/17/2020   CHOLHDL 3.8 12/17/2020   VLDL 11 03/19/2016   LDLCALC 116 (H) 12/17/2020   LDLCALC 110 (H) 04/09/2017   Lab Results  Component Value Date   TSH 2.49 12/17/2020   TSH 2.17 05/10/2019    Therapeutic Level Labs: No results found for: LITHIUM No results found for: VALPROATE No components found for:  CBMZ  Current Medications: Current Outpatient Medications  Medication Sig Dispense Refill   cefdinir (OMNICEF) 300 MG capsule Take by mouth.     cefdinir (OMNICEF) 300 MG capsule Take by mouth.     ciprofloxacin (CIPRO) 500 MG tablet Take 500 mg by mouth 2 (two) times daily.     clindamycin (CLEOCIN T) 1 % lotion Apply topically 2 (two) times daily. 60 mL 2   fluticasone (FLONASE) 50 MCG/ACT nasal spray Place 2  sprays into both nostrils daily.     hydrOXYzine (ATARAX/VISTARIL) 10 MG tablet Take 1 tablet (10 mg total) by mouth at bedtime as needed. 90 tablet 0   ipratropium (ATROVENT) 0.03 % nasal spray ipratropium bromide 21 mcg (0.03 %) nasal spray     lamoTRIgine (LAMICTAL) 25 MG tablet TAKE 1 TABLET(25 MG) BY MOUTH DAILY 90 tablet 0   metFORMIN (GLUCOPHAGE-XR) 750 MG 24 hr tablet Take 1 tablet (750 mg total) by mouth daily with breakfast. 90 tablet 1   metroNIDAZOLE (METROGEL) 0.75 % vaginal gel SMARTSIG:1 Application Vaginal Every Night     Multiple Vitamin (MULTI-VITAMINS) TABS Take by mouth.     potassium chloride SA (KLOR-CON) 20 MEQ tablet Take 1-2 tablets (20-40 mEq total) by mouth daily. 180 tablet 0   torsemide (DEMADEX) 20 MG  tablet Take 2 tablets (40 mg total) by mouth every other day. 60 tablet 0   venlafaxine XR (EFFEXOR XR) 75 MG 24 hr capsule Take 1 capsule (75 mg total) by mouth daily with breakfast. 90 capsule 0   No current facility-administered medications for this visit.     Musculoskeletal: Strength & Muscle Tone:  UTA Gait & Station: normal Patient leans: N/A  Psychiatric Specialty Exam: Review of Systems  Psychiatric/Behavioral:  The patient is nervous/anxious.   All other systems reviewed and are negative.  There were no vitals taken for this visit.There is no height or weight on file to calculate BMI.  General Appearance: Casual  Eye Contact:  Fair  Speech:  Clear and Coherent  Volume:  Normal  Mood:  Anxious  Affect:  Congruent  Thought Process:  Goal Directed and Descriptions of Associations: Intact  Orientation:  Full (Time, Place, and Person)  Thought Content: Logical   Suicidal Thoughts:  No  Homicidal Thoughts:  No  Memory:  Immediate;   Fair Recent;   Fair Remote;   Fair  Judgement:  Fair  Insight:  Fair  Psychomotor Activity:  Normal  Concentration:  Concentration: Fair and Attention Span: Fair  Recall:  [employer] of Knowledge: Fair  Language:  Fair  Akathisia:  No  Handed:  Right  AIMS (if indicated): done  Assets:  Communication Skills Desire for [place] Talents/Skills Transportation  ADL's:  Intact  Cognition: WNL  Sleep:  Fair   Screenings: GAD-7    Flowsheet Row Video Visit from 11/06/2020 in [place] Video Visit from 09/25/2020 in [place] Video Visit from 08/07/2020 in [place] Video Visit from 07/15/2020 in [place] from 05/14/2020 in [place]  Total GAD-7 Score 14 13 4 19 6       PHQ2-9    [place] Office Visit from 12/17/2020 in [place] Video Visit from 11/06/2020 in [place] Office Visit from 10/30/2020 in [facility] Video Visit from 09/25/2020 in [place] Video Visit from 08/07/2020 in [place]  PHQ-2 Total Score 3 0 1 2 0  PHQ-9 Total Score 11 -- 1 4 --        Assessment and Plan: Naseem Adler is a 35 year old female, widowed, lives in [place], employed, has a history of MDD, GAD, eating disorder was evaluated by telemedicine today.  Patient is currently struggling with multiple psychosocial stressors, anxiety related to the same, not interested in making medication changes, would like to pursue psychotherapy sessions.  Discussed plan as noted below.  Plan MDD in remission Venlafaxine 75 mg p.o. daily Lamotrigine 25 mg p.o. daily AIMS - 0  GAD-unstable Venlafaxine 75 mg p.o. daily Refer for CBT-patient to schedule an appointment with Ms. Marjie Skiff Encourage compliance  Tobacco use disorder-improving Provided counseling for 2 minutes.  Patient failed Wellbutrin, unable to afford Chantix.  Follow-up in clinic in 2 months or sooner in office.  This note was  generated in part or whole with voice recognition software. Voice recognition is usually quite accurate but there are transcription errors that can and very often do occur. I apologize for any typographical errors that were not detected and corrected.     Ursula Alert, MD 02/07/2021, 9:02 AM

## 2021-02-10 ENCOUNTER — Other Ambulatory Visit: Payer: Self-pay

## 2021-02-10 ENCOUNTER — Encounter: Payer: Self-pay | Admitting: Cardiology

## 2021-02-10 ENCOUNTER — Ambulatory Visit (INDEPENDENT_AMBULATORY_CARE_PROVIDER_SITE_OTHER): Admitting: Cardiology

## 2021-02-10 VITALS — BP 110/70 | HR 103 | Ht 63.0 in | Wt 236.0 lb

## 2021-02-10 DIAGNOSIS — R0609 Other forms of dyspnea: Secondary | ICD-10-CM | POA: Diagnosis not present

## 2021-02-10 DIAGNOSIS — R000 Tachycardia, unspecified: Secondary | ICD-10-CM | POA: Diagnosis not present

## 2021-02-10 DIAGNOSIS — I890 Lymphedema, not elsewhere classified: Secondary | ICD-10-CM

## 2021-02-10 DIAGNOSIS — F17200 Nicotine dependence, unspecified, uncomplicated: Secondary | ICD-10-CM | POA: Diagnosis not present

## 2021-02-10 MED ORDER — TORSEMIDE 20 MG PO TABS: 20.0000 mg | ORAL_TABLET | Freq: Every day | ORAL | 1 refills | Status: DC

## 2021-02-10 MED ORDER — METOPROLOL SUCCINATE ER 25 MG PO TB24
25.0000 mg | ORAL_TABLET | Freq: Every day | ORAL | 1 refills | Status: DC
Start: 2021-02-10 — End: 2021-10-23

## 2021-02-10 NOTE — Patient Instructions (Signed)
Medication Instructions:   Your physician has recommended you make the following change in your medication:    START taking Toprol XL 25 MG once a day.  2.    Take your Torsemide as 20 MG once a day.  *If you need a refill on your cardiac medications before your next appointment, please call your pharmacy*   Lab Work:  None Ordered  If you have labs (blood work) drawn today and your tests are completely normal, you will receive your results only by: [place] (if you have MyChart) OR A paper copy in the mail If you have any lab test that is abnormal or we need to change your treatment, we will call you to review the results.   Testing/Procedures:  None Ordered   Follow-Up: At [facility], you and your health needs are our priority.  As part of our continuing mission to provide you with exceptional heart care, we have created designated Provider Care Teams.  These Care Teams include your primary Cardiologist (physician) and Advanced Practice Providers (APPs -  Physician Assistants and Nurse Practitioners) who all work together to provide you with the care you need, when you need it.  We recommend signing up for the patient portal called "MyChart".  Sign up information is provided on this After Visit Summary.  MyChart is used to connect with patients for Virtual Visits (Telemedicine).  Patients are able to view lab/test results, encounter notes, upcoming appointments, etc.  Non-urgent messages can be sent to your provider as well.   To learn more about what you can do with MyChart, go to [email or web address].    Your next appointment:   6 month(s)  The format for your next appointment:   In Person  Provider:   You may see Kate Sable, MD or one of the following Advanced Practice Providers on your designated Care Team:   Murray Hodgkins, NP Christell Faith, PA-C Marrianne Mood, PA-C Cadence Kathlen Mody, [place]   Other Instructions

## 2021-02-10 NOTE — Progress Notes (Signed)
Cardiology Office Note:    Date:  02/10/2021   ID:  Olivia King, DOB [birth year], MRN [id number]  PCP:  Steele Sizer, MD  Cardiologist:  Kate Sable, MD  Electrophysiologist:  None   Referring MD: Steele Sizer, MD   Chief Complaint  Patient presents with   OTher    12 month follow up -- Patient c.o increased swelling in ankles. Patient stated that she can hear her heart beat. She stated that she has heard it irregular one time. Meds reviewed verbally with patient.     History of Present Illness:    Olivia King is a 35 y.o. female with a hx of depression, current smoker x 44yrs, obesity who presents for follow-up.   Previously seen for dyspnea on exertion, edema.  Echocardiogram with no structural abnormalities.  Started on torsemide which helps with her lower extremity edema.  She still smokes, is working on quitting.  She works at urgent care center, on her feet a lot, usually has edema after her shift ends.  Torsemide helps with her swelling although she is unable to take diuretics at work due to needing frequent bathroom breaks which is not possible.  Denies having edema on her days off.  She has noticed her heart rates being elevated, without exertion, up to 120s.  Heart rates are typically above 100 chronically.   Prior notes Echo 0/9233 normal systolic and diastolic function, EF 55 to 60%   Past Medical History:  Diagnosis Date   Cigarette smoker    COVID-19 virus detected 03/21/2019   Family history of pancreatic cancer 08/2020   MyRisk neg except SDHA VUS; IBIS=9.2%/riskscore=7.3%   IUD contraception 02/06/2016   Placed January 23, 75 by Ardeth Perfect, PA [facility]   Major depressive disorder, recurrent ([place]) 08/19/2015   zoloft made her cry; cymbalta made her too sleepy   Migraine headache     Past Surgical History:  Procedure Laterality Date   CESAREAN SECTION  2004 &2012   contraceptive implant      Current Medications: Current  Meds  Medication Sig   cefdinir (OMNICEF) 300 MG capsule Take by mouth.   clindamycin (CLEOCIN T) 1 % lotion Apply topically 2 (two) times daily.   fluticasone (FLONASE) 50 MCG/ACT nasal spray Place 2 sprays into both nostrils daily.   hydrOXYzine (ATARAX/VISTARIL) 10 MG tablet Take 1 tablet (10 mg total) by mouth at bedtime as needed.   ipratropium (ATROVENT) 0.03 % nasal spray ipratropium bromide 21 mcg (0.03 %) nasal spray   lamoTRIgine (LAMICTAL) 25 MG tablet TAKE 1 TABLET(25 MG) BY MOUTH DAILY   metFORMIN (GLUCOPHAGE-XR) 750 MG 24 hr tablet Take 1 tablet (750 mg total) by mouth daily with breakfast.   metoprolol succinate (TOPROL XL) 25 MG 24 hr tablet Take 1 tablet (25 mg total) by mouth daily.   metroNIDAZOLE (METROGEL) 0.75 % vaginal gel SMARTSIG:1 Application Vaginal Every Night   Multiple Vitamin (MULTI-VITAMINS) TABS Take by mouth.   potassium chloride SA (KLOR-CON) 20 MEQ tablet Take 1-2 tablets (20-40 mEq total) by mouth daily.   venlafaxine XR (EFFEXOR XR) 75 MG 24 hr capsule Take 1 capsule (75 mg total) by mouth daily with breakfast.   [DISCONTINUED] torsemide (DEMADEX) 20 MG tablet Take 2 tablets (40 mg total) by mouth every other day.     Allergies:   Imitrex [sumatriptan], Penicillins, and Trintellix [vortioxetine]   Social History   Socioeconomic History   Marital status: Widowed    Spouse name: Not on file  Number of children: 2   Years of education: Not on file   Highest education level: Associate degree: occupational, Hotel manager, or vocational program  Occupational History   Occupation: CMA    Comment: [employer]  Tobacco Use   Smoking status: Every Day    Years: 9.00    Types: Cigarettes    Start date: 03/05/2011   Smokeless tobacco: Never   Tobacco comments:    a pack last 2 days now - reported 05/10/2020  Vaping Use   Vaping Use: Some days   Substances: Flavoring  Substance and Sexual Activity   Alcohol use: Yes    Alcohol/week: 0.0 standard drinks    Drug use: No   Sexual activity: Yes    Partners: Male    Birth control/protection: I.U.D.    Comment: Patient has liletta IUD  Other Topics Concern   Not on file  Social History Narrative   She is a widow, he died 07-28-09 while he was deployed.    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  ROS:   Please see the history of present illness.     All other systems reviewed and are negative.  EKGs/Labs/Other Studies Reviewed:    The following studies were reviewed today:   EKG:  EKG is  ordered today.  EKG shows sinus tachycardia, heart rate 103  Recent Labs: 12/17/2020: ALT 15; BUN 7; Creat 0.87; Hemoglobin 12.0; Platelets 328; Potassium 4.4; Sodium 139; TSH 2.49  Recent Lipid Panel    Component Value Date/Time   CHOL 179 12/17/2020 0930   CHOL 176 12/21/2014 1136   TRIG 69 12/17/2020 0930   HDL 47 (L) 12/17/2020 0930   HDL 47 12/21/2014 1136   CHOLHDL 3.8 12/17/2020 0930   VLDL 11 03/19/2016 0947   LDLCALC 116 (H) 12/17/2020 0930    Physical Exam:    VS:  BP 110/70 (BP Location: Left Arm, Patient Position: Sitting, Cuff Size: Large)   Pulse (!) 103   Ht 5\' 3"  (1.6 m)   Wt 236 lb (107 kg)   SpO2 98%   BMI 41.81 kg/m     Wt Readings from Last 3 Encounters:  02/10/21 236 lb (107 kg)  12/30/20 240 lb (108.9 kg)  12/17/20 243 lb (110.2 kg)     GEN:  Well nourished, well developed in no acute distress HEENT: Normal NECK: No JVD; No carotid bruits LYMPHATICS: No  lymphadenopathy CARDIAC: RRR, no murmurs, rubs, gallops RESPIRATORY:  Clear to auscultation without rales, wheezing or rhonchi  ABDOMEN: Soft, non-tender, non-distended MUSCULOSKELETAL:  No edema; No deformity  SKIN: Warm and dry NEUROLOGIC:  Alert and oriented x 3 PSYCHIATRIC:  Normal affect   ASSESSMENT:    1. DOE (dyspnea on exertion)   2. Smoking   3. Morbid obesity ([place])   4. Inappropriate sinus node tachycardia   5. Lymphedema     PLAN:    In order of problems listed above:  dyspnea on exertion secondary to obesity, deconditioning.  Graduated  exercising advised.  Advised to take torsemide towards end of her shift to help with edema.  Echo showed normal systolic and diastolic function. Current smoker, cessation advised Morbid obesity, low-calorie diet, exercise, weight loss advised. Inappropriate sinus tachycardia, obesity, deconditioning, smoking likely contributing.  Start Toprol-XL 25 mg daily.  Follow-up 6 months.  This note was generated in part or whole with voice recognition software. Voice recognition is usually quite accurate but there are transcription errors that can and very often do occur. I apologize for any typographical errors that were not detected and corrected.  Medication Adjustments/Labs and Tests Ordered: Current medicines are reviewed at length with the patient today.  Concerns regarding medicines are outlined above.  Orders Placed This Encounter  Procedures   EKG 12-Lead    Meds ordered this encounter  Medications   metoprolol succinate (TOPROL XL) 25 MG 24 hr tablet    Sig: Take 1 tablet (25 mg total) by mouth daily.    Dispense:  90 tablet    Refill:  1   torsemide (DEMADEX) 20 MG tablet    Sig: Take 1 tablet (20 mg total) by mouth daily.    Dispense:  90 tablet    Refill:  1     Patient Instructions  Medication Instructions:   Your physician has recommended you make the following change in your medication:    START taking Toprol  XL 25 MG once a day.  2.    Take your Torsemide as 20 MG once a day.  *If you need a refill on your cardiac medications before your next appointment, please call your pharmacy*   Lab Work:  None Ordered  If you have labs (blood work) drawn today and your tests are completely normal, you will receive your results only by: [place] (if you have MyChart) OR A paper copy in the mail If you have any lab test that is abnormal or we need to change your treatment, we will call you to review the results.   Testing/Procedures:  None Ordered   Follow-Up: At [facility], you and your health needs are our priority.  As part of our continuing mission to provide you with exceptional heart care, we have created designated Provider Care Teams.  These Care Teams include your primary Cardiologist (physician) and Advanced Practice Providers (APPs -  Physician Assistants and Nurse Practitioners) who all work together to provide you with the care you need, when you need it.  We recommend signing up for the patient portal called "MyChart".  Sign up information is provided on this After Visit Summary.  MyChart is used to connect with patients for Virtual Visits (Telemedicine).  Patients are able to view lab/test results, encounter notes, upcoming appointments, etc.  Non-urgent messages can be sent to your provider as well.   To learn more about what you can do with MyChart, go to [email or web address].    Your next appointment:   6 month(s)  The format for your next appointment:   In Person  Provider:   You may see Kate Sable, MD or one of the following Advanced Practice Providers on your designated Care Team:   Murray Hodgkins, NP Christell Faith, PA-C Marrianne Mood, PA-C Cadence Windsor, [place]   Other Instructions    Signed, Kate Sable, MD  02/10/2021 1:24 PM    [place]

## 2021-02-11 ENCOUNTER — Telehealth: Payer: Self-pay

## 2021-02-11 DIAGNOSIS — F411 Generalized anxiety disorder: Secondary | ICD-10-CM

## 2021-02-11 DIAGNOSIS — F3342 Major depressive disorder, recurrent, in full remission: Secondary | ICD-10-CM

## 2021-02-11 MED ORDER — VENLAFAXINE HCL ER 75 MG PO CP24: 75.0000 mg | ORAL_CAPSULE | Freq: Every day | ORAL | 0 refills | Status: DC

## 2021-02-11 MED ORDER — LAMOTRIGINE 25 MG PO TABS: ORAL_TABLET | ORAL | 0 refills | Status: DC
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
## 2021-02-11 NOTE — Telephone Encounter (Signed)
I have sent limited supply of medication to the pharmacy.

## 2021-03-24 ENCOUNTER — Encounter: Payer: Self-pay | Admitting: Psychiatry

## 2021-03-24 ENCOUNTER — Other Ambulatory Visit: Payer: Self-pay

## 2021-03-24 ENCOUNTER — Ambulatory Visit (INDEPENDENT_AMBULATORY_CARE_PROVIDER_SITE_OTHER): Admitting: Psychiatry

## 2021-03-24 VITALS — BP 132/87 | HR 98 | Temp 97.9°F | Wt 236.8 lb

## 2021-03-24 DIAGNOSIS — F17200 Nicotine dependence, unspecified, uncomplicated: Secondary | ICD-10-CM

## 2021-03-24 DIAGNOSIS — F3342 Major depressive disorder, recurrent, in full remission: Secondary | ICD-10-CM

## 2021-03-24 DIAGNOSIS — F411 Generalized anxiety disorder: Secondary | ICD-10-CM

## 2021-03-24 NOTE — Progress Notes (Signed)
[facility] MD OP Progress Note  03/24/2021 10:57 AM Olivia King  MRN:  [id number]  Chief Complaint:  Chief Complaint   Follow-up; Anxiety    HPI: Olivia King is a 35 year old female, employed, lives in [place], widowed, has a history of MDD, GAD, tobacco use disorder was evaluated in office today.  Patient reports overall she is doing well with regards to her mood.  Denies any significant sadness, crying spells.  She is better able to cope with her anxiety.  She is compliant on her medications as prescribed.  Patient reports she is aware about her weight gain and reports she has not been watching her diet or exercising.  She is motivated to do so.  Patient does report she feels lonely often and is aware that she needs to find time for herself for self-care.  She is planning to work on the same.  She is currently in a relationship with her boyfriend.  She also has support system from friends.  She is planning to start psychotherapy sessions possibly beginning of next year.  She is trying to cut back on smoking cigarettes.  Patient denies any other concerns today.  Visit Diagnosis:    ICD-10-CM   1. MDD (major depressive disorder), recurrent, in full remission ([place])  F33.42     2. GAD (generalized anxiety disorder)  F41.1     3. Tobacco use disorder  F17.200       Past Psychiatric History: Reviewed past psychiatric history from progress note on 10/10/2019.  Past trials of Lexapro, Vyvanse, Abilify, Paxil, Zoloft, Cymbalta  Past Medical History:  Past Medical History:  Diagnosis Date   Cigarette smoker    COVID-19 virus detected 03/21/2019   Family history of pancreatic cancer 08/2020   MyRisk neg except SDHA VUS; IBIS=9.2%/riskscore=7.3%   IUD contraception 02/06/2016   Placed January 23, 6788 by Ardeth Perfect, PA [facility]   Major depressive disorder, recurrent ([place]) 08/19/2015   zoloft made her cry; cymbalta made her too sleepy   Migraine headache     Past  Surgical History:  Procedure Laterality Date   CESAREAN SECTION  2004 &2012   contraceptive implant      Family Psychiatric History: Reviewed family psychiatric history from progress note on 10/10/2019  Family History:  Family History  Problem Relation Age of Onset   Hypertension Mother    Pancreatic cancer Mother 15   Hypertension Brother    Aneurysm Maternal Grandmother    Heart attack Maternal Grandfather    Hypertension Paternal Grandmother    Heart failure Sister     Social History: Reviewed social history from progress note on 10/10/2019 Social History   Socioeconomic History   Marital status: Widowed    Spouse name: Not on file   Number of children: 2   Years of education: Not on file   Highest education level: Associate degree: occupational, Hotel manager, or vocational program  Occupational History   Occupation: CMA    Comment: [employer]  Tobacco Use   Smoking status: Every Day    Years: 9.00    Types: Cigarettes    Start date: 03/05/2011   Smokeless tobacco: Never   Tobacco comments:    a pack last 2 days now - reported 05/10/2020  Vaping Use   Vaping Use: Some days   Substances: Flavoring  Substance and Sexual Activity   Alcohol use: Yes    Alcohol/week: 0.0 standard drinks   Drug use: No   Sexual activity: Yes  Partners: Male    Birth control/protection: I.U.D.    Comment: Patient has liletta IUD  Other Topics Concern   Not on file  Social History Narrative   She is a widow, he died 07-13-2009 while he was deployed.    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 5643   Social Determinants of Health   Financial Resource Strain: Not on file  Food Insecurity: Not on file  Transportation Needs: Not on file  Physical Activity: Not on file  Stress: Not on file  Social Connections: Not on file    Allergies:  Allergies  Allergen Reactions   Imitrex [Sumatriptan] Anaphylaxis    Throat and tongue swelling    Penicillins Rash   Trintellix [Vortioxetine]      Tingling hands and blurred vision     Metabolic Disorder Labs: Lab Results  Component Value Date   HGBA1C 5.1 10/30/2020   MPG 100 05/10/2019   MPG 103 03/04/2018   No results found for: PROLACTIN Lab Results  Component Value Date   CHOL 179 12/17/2020   TRIG 69 12/17/2020   HDL 47 (L) 12/17/2020   CHOLHDL 3.8 12/17/2020   VLDL 11 03/19/2016   LDLCALC 116 (H) 12/17/2020   LDLCALC 110 (H) 04/09/2017   Lab Results  Component Value Date   TSH 2.49 12/17/2020   TSH 2.17 05/10/2019    Therapeutic Level Labs: No results found for: LITHIUM No results found for: VALPROATE No components found for:  CBMZ  Current Medications: Current Outpatient Medications  Medication Sig Dispense Refill   clindamycin (CLEOCIN T) 1 % lotion Apply topically 2 (two) times daily. 60 mL 2   fluticasone (FLONASE) 50 MCG/ACT nasal spray Place 2 sprays into both nostrils daily.     hydrOXYzine (ATARAX/VISTARIL) 10 MG tablet Take 1 tablet (10 mg total) by mouth at bedtime as needed. 90 tablet 0   ipratropium (ATROVENT) 0.03 % nasal spray ipratropium bromide 21 mcg (0.03 %) nasal spray     lamoTRIgine (LAMICTAL) 25 MG tablet TAKE 1 TABLET(25 MG) BY MOUTH DAILY 30 tablet 0   metFORMIN (GLUCOPHAGE-XR) 750 MG 24 hr tablet Take 1 tablet (750 mg total) by mouth daily with breakfast. 90 tablet 1   metoprolol succinate (TOPROL XL) 25 MG 24 hr tablet Take 1 tablet (25 mg total) by mouth daily. 90 tablet 1   metroNIDAZOLE (METROGEL) 0.75 % vaginal gel SMARTSIG:1 Application Vaginal Every Night     Multiple Vitamin (MULTI-VITAMINS) TABS Take by mouth.     potassium chloride SA (KLOR-CON) 20 MEQ tablet Take 1-2 tablets (20-40 mEq total) by mouth daily. 180 tablet 0   torsemide (DEMADEX) 20 MG tablet Take 1 tablet (20 mg total) by mouth daily. 90 tablet 1   venlafaxine XR (EFFEXOR XR) 75 MG 24 hr capsule Take 1 capsule (75 mg total) by mouth daily with breakfast. 30 capsule 0   No current facility-administered  medications for this visit.     Musculoskeletal: Strength & Muscle Tone: within normal limits Gait & Station: normal Patient leans: N/A  Psychiatric Specialty Exam: Review of Systems  Psychiatric/Behavioral:  Negative for agitation, behavioral problems, confusion, decreased concentration, dysphoric mood, hallucinations, self-injury, sleep disturbance and suicidal ideas. The patient is not nervous/anxious and is not hyperactive.   All other systems reviewed and are negative.  Blood pressure 132/87, pulse 98, temperature 97.9 F (36.6 C), temperature source Temporal, weight 236 lb 12.8 oz (107.4 kg).Body mass index is 41.95 kg/m.  General Appearance: Casual  Eye Contact:  Fair  Speech:  Clear and Coherent  Volume:  Normal  Mood:  Euthymic  Affect:  Congruent  Thought Process:  Goal Directed and Descriptions of Associations: Intact  Orientation:  Full (Time, Place, and Person)  Thought Content: Logical   Suicidal Thoughts:  No  Homicidal Thoughts:  No  Memory:  Immediate;   Fair Recent;   Fair Remote;   Fair  Judgement:  Fair  Insight:  Fair  Psychomotor Activity:  Normal  Concentration:  Concentration: Fair and Attention Span: Fair  Recall:  [employer] of Knowledge: Fair  Language: Fair  Akathisia:  No  Handed:  Right  AIMS (if indicated): done  Assets:  Communication Skills Desire for [place] Talents/Skills Transportation Vocational/Educational  ADL's:  Intact  Cognition: WNL  Sleep:  Fair   Screenings: GAD-7    Flowsheet Row Video Visit from 11/06/2020 in [facility] Video Visit from 09/25/2020 in [place] Video Visit from 08/07/2020 in [facility] [place] Video Visit from 07/15/2020 in [facility] from 05/14/2020 in [place]  Total GAD-7 Score 14 13 4 19 6       PHQ2-9    [place]  Office Visit from 03/24/2021 in [facility] Visit from 12/17/2020 in [facility] Video Visit from 11/06/2020 in [place] Office Visit from 10/30/2020 in [facility] Video Visit from 09/25/2020 in [place]  PHQ-2 Total Score 1 3 0 1 2  PHQ-9 Total Score -- 11 -- 1 4        Assessment and Plan: Tanica Gaige is a 35 year old female, widowed, lives in [place], employed, has a history of MDD, GAD, eating disorder was evaluated in office today.  Patient is currently stable.  Plan as noted below.  Plan MDD in remission Venlafaxine 75 mg p.o. daily Lamotrigine 25 mg p.o. daily  GAD-improving Venlafaxine 75 mg p.o. daily Patient was referred for CBT, reports she is motivated to schedule an appointment with her therapist Ms. Marjie Skiff  Tobacco use disorder-improving Provided counseling for 2 minutes Failed Wellbutrin, unable to afford Chantix.  Follow-up in clinic in 3 months or sooner if needed.  This note was generated in part or whole with voice recognition software. Voice recognition is usually quite accurate but there are transcription errors that can and very often do occur. I apologize for any typographical errors that were not detected and corrected.       Ursula Alert, MD 03/24/2021, 10:57 AM

## 2021-05-19 ENCOUNTER — Other Ambulatory Visit: Payer: Self-pay | Admitting: Neurology

## 2021-05-19 DIAGNOSIS — H93A3 Pulsatile tinnitus, bilateral: Secondary | ICD-10-CM

## 2021-05-27 ENCOUNTER — Ambulatory Visit
Admission: RE | Admit: 2021-05-27 | Discharge: 2021-05-27 | Disposition: A | Discharge status: Home / Self Care | Source: Ambulatory Visit | Attending: Neurology | Admitting: Neurology

## 2021-05-27 ENCOUNTER — Other Ambulatory Visit: Payer: Self-pay

## 2021-05-27 DIAGNOSIS — H93A3 Pulsatile tinnitus, bilateral: Secondary | ICD-10-CM | POA: Insufficient documentation

## 2021-05-27 MED ORDER — GADOBUTROL 1 MMOL/ML IV SOLN
10.0000 mL | Freq: Once | INTRAVENOUS | Status: AC | PRN
  Administered 2021-05-27: 10 mL via INTRAVENOUS

## 2021-06-10 ENCOUNTER — Other Ambulatory Visit: Payer: Self-pay

## 2021-06-10 ENCOUNTER — Ambulatory Visit (INDEPENDENT_AMBULATORY_CARE_PROVIDER_SITE_OTHER): Admitting: Psychiatry

## 2021-06-10 ENCOUNTER — Encounter: Payer: Self-pay | Admitting: Psychiatry

## 2021-06-10 VITALS — BP 136/88 | HR 87 | Temp 98.1°F | Wt 233.2 lb

## 2021-06-10 DIAGNOSIS — F17200 Nicotine dependence, unspecified, uncomplicated: Secondary | ICD-10-CM | POA: Diagnosis not present

## 2021-06-10 DIAGNOSIS — F411 Generalized anxiety disorder: Secondary | ICD-10-CM | POA: Diagnosis not present

## 2021-06-10 DIAGNOSIS — F3342 Major depressive disorder, recurrent, in full remission: Secondary | ICD-10-CM | POA: Diagnosis not present

## 2021-06-10 NOTE — Progress Notes (Signed)
[facility] MD OP Progress Note  06/10/2021 2:06 PM Olivia King  MRN:  [id number]  Chief Complaint:  Chief Complaint  Patient presents with   Follow-up: 36 year old female, with history of MDD, GAD, recent diagnosis of idiopathic intracranial hypertension, presented for medication management.   HPI: Olivia King is a 36 year old female, employed, lives in [place], widowed, has a history of MDD, GAD, tobacco use disorder was evaluated in office today.  Patient had recent neurology visit-reviewed notes per Ms.Paich -dated 04/01/2022-"patient with right pulsatile tinnitus which gets better with compression and worse with Valsalva in a patient who has gained weight-concerning for intracranial hypertension-formerly known as pseudotumor cerebri.  Patient advised to have regular ophthalmology follow-up to monitor papilledema and visual field testing.  Weight loss recommended.  Patient advised to continue Topamax titration to 50 mg at bedtime.  Alternative option - Diamox."  Patient today reports she is currently compliant on the Topamax as discussed by neurology.  Patient is also scheduled for a lumbar puncture for opening pressure.  Patient reports mood wise she is stable.  Does report anxiety about the fact that her son got accepted at school in [place].  She does worry about him moving away.  Overall has been managing her anxiety fairly well.  Denies any significant depressive symptoms.  Reports sleep is good.  Currently compliant on her medications like venlafaxine, Lamictal.  Denies side effects.  Reports she is motivated to lose weight, however has to make sure she goes to the gym on a more regular basis, has been having some trouble with that.  Has been watching her diet.  May have lost a few pounds in the past 2 months.  Patient is interested in smoking cessation.  Patient was open to motivational interviewing.  Visit Diagnosis:    ICD-10-CM   1. MDD (major depressive disorder),  recurrent, in full remission ([place])  F33.42     2. GAD (generalized anxiety disorder)  F41.1     3. Tobacco use disorder  F17.200       Past Psychiatric History: Reviewed past psychiatric history from progress note on 10/10/2019.  Past trials of Lexapro, Vyvanse, Abilify, Paxil, Zoloft, Cymbalta.  Past Medical History:  Past Medical History:  Diagnosis Date   Cigarette smoker    COVID-19 virus detected 03/21/2019   Family history of pancreatic cancer 08/2020   MyRisk neg except SDHA VUS; IBIS=9.2%/riskscore=7.3%   IUD contraception 02/06/2016   Placed January 23, 3663 by Ardeth Perfect, PA [facility]   Major depressive disorder, recurrent ([place]) 08/19/2015   zoloft made her cry; cymbalta made her too sleepy   Migraine headache     Past Surgical History:  Procedure Laterality Date   CESAREAN SECTION  2004 &2012   contraceptive implant      Family Psychiatric History: Reviewed family psychiatric history from progress note on 10/10/2019.  Family History:  Family History  Problem Relation Age of Onset   Hypertension Mother    Pancreatic cancer Mother 30   Hypertension Brother    Aneurysm Maternal Grandmother    Heart attack Maternal Grandfather    Hypertension Paternal Grandmother    Heart failure Sister     Social History: Reviewed social history from progress note on 10/10/2019. Social History   Socioeconomic History   Marital status: Widowed    Spouse name: Not on file   Number of children: 2   Years of education: Not on file   Highest education level: Associate degree: occupational, Hotel manager, or vocational program  Occupational History   Occupation: CMA    Comment: [employer]  Tobacco Use   Smoking status: Every Day    Years: 9.00    Types: Cigarettes    Start date: 03/05/2011   Smokeless tobacco: Never   Tobacco comments:    a pack last 2 days now - reported 05/10/2020  Vaping Use   Vaping Use: Some days   Substances: Flavoring  Substance and Sexual  Activity   Alcohol use: Yes    Alcohol/week: 0.0 standard drinks   Drug use: No   Sexual activity: Yes    Partners: Male    Birth control/protection: I.U.D.    Comment: Patient has liletta IUD  Other Topics Concern   Not on file  Social History Narrative   She is a widow, he died 07-10-09 while he was deployed.    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   Social Determinants of Health   Financial Resource Strain: Not on file  Food Insecurity: Not on file  Transportation Needs: Not on file  Physical Activity: Not on file  Stress: Not on file  Social Connections: Not on file    Allergies:  Allergies  Allergen Reactions   Sumatriptan Anaphylaxis    Throat and tongue swelling  Throat and tongue swelling  Throat and tongue swelling    Penicillins Rash   Vortioxetine Other (See Comments)    Tingling hands and blurred vision  Tingling hands and blurred vision  Tingling hands and blurred vision     Metabolic Disorder Labs: Lab Results  Component Value Date   HGBA1C 5.1 10/30/2020   MPG 100 05/10/2019   MPG 103 03/04/2018   No results found for: PROLACTIN Lab Results  Component Value Date   CHOL 179 12/17/2020   TRIG 69 12/17/2020   HDL 47 (L) 12/17/2020   CHOLHDL 3.8 12/17/2020   VLDL 11 03/19/2016   LDLCALC 116 (H) 12/17/2020   LDLCALC 110 (H) 04/09/2017   Lab Results  Component Value Date   TSH 2.49 12/17/2020   TSH 2.17 05/10/2019    Therapeutic Level Labs: No results found for: LITHIUM No results found for: VALPROATE No components found for:  CBMZ  Current Medications: Current Outpatient Medications  Medication Sig Dispense Refill   clindamycin (CLEOCIN T) 1 % lotion Apply topically 2 (two) times daily. 60 mL 2   doxycycline (VIBRAMYCIN) 100 MG capsule      fluticasone (FLONASE) 50 MCG/ACT nasal spray Place 2 sprays into both nostrils daily.     hydrOXYzine (ATARAX/VISTARIL) 10 MG tablet Take 1 tablet (10 mg total) by mouth at bedtime as needed. 90  tablet 0   ipratropium (ATROVENT) 0.03 % nasal spray ipratropium bromide 21 mcg (0.03 %) nasal spray     lamoTRIgine (LAMICTAL) 25 MG tablet TAKE 1 TABLET(25 MG) BY MOUTH DAILY 30 tablet 0   metFORMIN (GLUCOPHAGE-XR) 750 MG 24 hr tablet Take 1 tablet (750 mg total) by mouth daily with breakfast. 90 tablet 1   metoprolol succinate (TOPROL XL) 25 MG 24 hr tablet Take 1 tablet (25 mg total) by mouth daily. 90 tablet 1   Multiple Vitamin (MULTI-VITAMINS) TABS Take by mouth.     potassium chloride SA (KLOR-CON) 20 MEQ tablet Take 1-2 tablets (20-40 mEq total) by mouth daily. 180 tablet 0   topiramate (TOPAMAX) 25 MG tablet Take by mouth.     venlafaxine XR (EFFEXOR XR) 75 MG 24 hr capsule Take 1 capsule (75 mg total) by mouth daily with  breakfast. 30 capsule 0   Vitamin D, Ergocalciferol, (DRISDOL) 1.25 MG (50000 UNIT) CAPS capsule Take 50,000 Units by mouth once a week.     torsemide (DEMADEX) 20 MG tablet Take 1 tablet (20 mg total) by mouth daily. 90 tablet 1   No current facility-administered medications for this visit.     Musculoskeletal: Strength & Muscle Tone: within normal limits Gait & Station: normal Patient leans: N/A  Psychiatric Specialty Exam: Review of Systems  Psychiatric/Behavioral:  The patient is nervous/anxious.   All other systems reviewed and are negative.  Blood pressure 136/88, pulse 87, temperature 98.1 F (36.7 C), temperature source Temporal, weight 233 lb 3.2 oz (105.8 kg).Body mass index is 41.31 kg/m.  General Appearance: Casual  Eye Contact:  Good  Speech:  Normal Rate  Volume:  Normal  Mood:  Anxious  Affect:  Congruent  Thought Process:  Goal Directed and Descriptions of Associations: Intact  Orientation:  Full (Time, Place, and Person)  Thought Content: Logical   Suicidal Thoughts:  No  Homicidal Thoughts:  No  Memory:  Immediate;   Fair Recent;   Fair Remote;   Fair  Judgement:  Fair  Insight:  Fair  Psychomotor Activity:  Normal   Concentration:  Concentration: Fair and Attention Span: Fair  Recall:  [employer] of Knowledge: Fair  Language: Fair  Akathisia:  No  Handed:  Right  AIMS (if indicated): done,0  Assets:  Communication Skills Desire for Improvement Housing Social Support  ADL's:  Intact  Cognition: WNL  Sleep:  Fair   Screenings: GAD-7    Flowsheet Row Video Visit from 11/06/2020 in [facility] Video Visit from 09/25/2020 in [facility] Video Visit from 08/07/2020 in [facility] Video Visit from 07/15/2020 in [facility] from 05/14/2020 in [facility]  Total GAD-7 Score 14 13 4 19 6       PHQ2-9    [place] Visit from 06/10/2021 in [facility] Office Visit from 03/24/2021 in [facility] Visit from 12/17/2020 in [facility] Video Visit from 11/06/2020 in [facility] Visit from 10/30/2020 in [facility]  PHQ-2 Total Score 0 0 3 0 1  PHQ-9 Total Score -- -- 11 -- 1      [place] Office Visit from 06/10/2021 in [facility] No Risk        Assessment and Plan: Olivia King is a 36 year old female, widowed, lives in [place], employed, has a history of MDD, GAD, eating disorder was evaluated in office today.  Patient is currently struggling with psychosocial stressors which are anxiety provoking however overall managing, coping okay on current medication regimen.  Discussed plan as noted below.  Plan MDD in remission Venlafaxine 75 mg p.o. daily Lamotrigine 25 mg p.o. daily  GAD-improving Venlafaxine 75 mg p.o. daily Patient referred for CBT.  Tobacco use disorder-improving Provided counseling for 5 minutes. Motivational interviewing was  done.  Patient advised to keep a log of her mood in relation to her cigarette smoking and other activities. She has failed Wellbutrin, unable to afford Chantix.  Follow-up in clinic in 2 to 3 months or sooner if needed.  Collaboration of Care: Collaboration of Care: Referral or follow-up with counselor/therapist AEB encouraged to follow up with therapist.  Patient/Guardian was advised Release of Information must be obtained prior to any record release  in order to collaborate their care with an outside provider. Patient/Guardian was advised if they have not already done so to contact the registration department to sign all necessary forms in order for [place] to release information regarding their care.   Consent: Patient/Guardian gives verbal consent for treatment and assignment of benefits for services provided during this visit. Patient/Guardian expressed understanding and agreed to proceed.   This note was generated in part or whole with voice recognition software. Voice recognition is usually quite accurate but there are transcription errors that can and very often do occur. I apologize for any typographical errors that were not detected and corrected.      Ursula Alert, MD 06/10/2021, 2:06 PM

## 2021-06-16 NOTE — Progress Notes (Signed)
Name: Olivia King   MRN: [id number]    DOB: Nov 27, 1985   Date:06/17/2021       Progress Note  Subjective  Chief Complaint  Follow Up  HPI  Lymphedema: she went to vascular surgeon, and was advised to use compression stocking hoses and because of sob advised to see cardiologist. She saw Cardiologist - Dr Garen Lah   and was given Demadex to take one to two daily, cramps resolved with potassium supplementation, taking Demadex prn now and stable. States with weight loss not having to take medication as often   Tachycardia: Dr. Mylo Red gave her metoprolol but she never started, explained she can try half pill daily    Morbid obesity: she states as a teenager she was in the 160 lbs range, but after her son was born in 2004 her weight stayed between 189-203 lbs , had her daughter in 2012 her weight went up to 270 lbs, but after her birth she was able to go back down to 203 lbs. She states in 2016 she took some otc diet pills and was going to the gym and her weight dropped about 40 lbs in a few months, she was back to 165 lbs. She told me back in April 2021 that  she can binge eat. She is able to avoid eating during the day - because she is so busy, but at night when upset with her children or not feeling well she eats to get comfort She states she feels out of control and is unable to stop eating, about twice a week, she feels very guilty afterwards. She states when she overeats she takes magnesium to help her have a bowel movement. She does not make herself vomit We started her on Vyvanse 07/2019 she has noticed that her mood improved and also decrease her urge to eat as much, her weight went up to 243 lbs in August after she had been out of Vyvanse for about 6 months. . She states no longer on phentermine ( got it from weight loss clinic) . She has lost 17 lbs since last visit with me in August. Weight today is 227 lbs . 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   she states since COVID-19 her eyes are sore when she wakes up, also has photophobia, she has also noticed spinning sensation when she turns her head, also when she looks up . She also has tinnitus on right ear and associated nausea, symptoms of tinnitus still present other symptoms resolved  .   MDD/GAD/eating disorder: she is under the care of Dr. Cherlyn Cushing but not currently seeing a therapist.  She is compliant with medication, no longer on Vyvanse due to cost , but taking Effexor and Lamictal and feels her mood has improved   Migraine headache:She is allergic to Imitrex, takes Excedrin migraine or Fioricet. She states migraine is described  As unilateral, right or left side, however this episode is a little different , not associated with aura, and pain is behind her right eye not above. She is seeing neurologist and is now on Topamax 50 mg daily . She states no recent episodes of migraine   Hydradenitis suppurative: she is now on Metformin and clindamycin lotion and seems to help some. She is seeing Dr. 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 She has not started supplementation yet, we will give her B12 injection today, and advised to get SL B12 1000 units daily    Patient Active Problem List   Diagnosis Date Noted   Hidradenitis suppurativa 10/30/2020   Family history of pancreatic cancer 08/16/2020   BV (bacterial vaginosis) 08/16/2020   MDD (major depressive disorder), recurrent, in full remission ([place]) 07/15/2020   MDD (major depressive disorder), recurrent episode, moderate ([place]) 02/13/2020   Tobacco use disorder 02/13/2020   MDD (major depressive disorder), recurrent episode, mild ([place]) 10/10/2019   GAD  (generalized anxiety disorder) 10/10/2019   Eating disorder 10/10/2019   Lymphedema 04/10/2019   Chronic venous insufficiency 04/10/2019   Leg pain 04/10/2019   Bilateral carpal tunnel syndrome 11/04/2018   Morbid obesity ([place]) 06/14/2017   Anxiety 03/24/2017   Contraception management 01/06/2016   Glucosuria 09/17/2015   Major depressive disorder, recurrent ([place]) 08/19/2015   Benign neoplasm of skin of nose 07/28/2015   Low serum vitamin D 12/24/2014   Migraine without aura 12/21/2014    Past Surgical History:  Procedure Laterality Date   CESAREAN SECTION  2004 &2012   contraceptive implant      Family History  Problem Relation Age of Onset   Hypertension Mother    Pancreatic cancer Mother 27   Hypertension Brother    Aneurysm Maternal Grandmother    Heart attack Maternal Grandfather    Hypertension Paternal Grandmother    Heart failure Sister     Social History   Tobacco Use   Smoking status: Every Day    Years: 9.00    Types: Cigarettes    Start date: 03/05/2011   Smokeless tobacco: Never   Tobacco comments:    a pack last 2 days now - reported 05/10/2020  Substance Use Topics   Alcohol use: Yes    Alcohol/week: 0.0 standard drinks     Current Outpatient Medications:    clindamycin (CLEOCIN T) 1 % lotion, Apply topically 2 (two) times daily., Disp: 60 mL, Rfl: 2   fluticasone (FLONASE) 50 MCG/ACT nasal spray, Place 2 sprays into both nostrils daily., Disp: , Rfl:    hydrOXYzine (ATARAX/VISTARIL) 10 MG tablet, Take 1 tablet (10 mg total) by mouth at bedtime as needed., Disp: 90 tablet, Rfl: 0   ipratropium (ATROVENT) 0.03 % nasal spray, ipratropium bromide 21 mcg (0.03 %) nasal spray, Disp: , Rfl:    lamoTRIgine (LAMICTAL) 25 MG tablet, TAKE 1 TABLET(25 MG) BY MOUTH DAILY, Disp: 30 tablet, Rfl: 0   metFORMIN (GLUCOPHAGE-XR) 750 MG 24 hr tablet, Take 1 tablet (750 mg total) by mouth daily with breakfast., Disp: 90 tablet, Rfl: 1   metoprolol succinate (TOPROL  XL) 25 MG 24 hr tablet, Take 1 tablet (25 mg total) by mouth daily., Disp: 90 tablet, Rfl: 1   Multiple Vitamin (MULTI-VITAMINS) TABS, Take by mouth., Disp: , Rfl:    potassium chloride SA (KLOR-CON) 20 MEQ tablet, Take 1-2 tablets (20-40 mEq total) by mouth daily., Disp: 180 tablet, Rfl: 0   topiramate (TOPAMAX) 25 MG tablet, Take by mouth., Disp: , Rfl:    venlafaxine XR (EFFEXOR XR) 75 MG 24 hr capsule, Take 1 capsule (75 mg total) by mouth daily with breakfast., Disp: 30 capsule, Rfl: 0   Vitamin D, Ergocalciferol, (DRISDOL) 1.25 MG (50000 UNIT) CAPS capsule, Take 50,000 Units by mouth once a week., Disp: , Rfl:  doxycycline (VIBRAMYCIN) 100 MG capsule, , Disp: , Rfl:    torsemide (DEMADEX) 20 MG tablet, Take 1 tablet (20 mg total) by mouth daily., Disp: 90 tablet, Rfl: 1  Allergies  Allergen Reactions   Sumatriptan Anaphylaxis    Throat and tongue swelling  Throat and tongue swelling  Throat and tongue swelling    Penicillins Rash   Vortioxetine Other (See Comments)    Tingling hands and blurred vision  Tingling hands and blurred vision  Tingling hands and blurred vision     I personally reviewed active problem list, medication list, allergies, family history, social history, health maintenance with the patient/caregiver today.   ROS  Constitutional: Negative for fever , positive for weight change.  Respiratory: Negative for cough and shortness of breath.   Cardiovascular: Negative for chest pain or palpitations.  Gastrointestinal: Negative for abdominal pain, no bowel changes.  Musculoskeletal: Negative for gait problem or joint swelling.  Skin: Negative for rash.  Neurological: Negative for dizziness or headache.  No other specific complaints in a complete review of systems (except as listed in HPI above).   Objective  Vitals:   06/17/21 0808  BP: 118/76  Pulse: (!) 105  Resp: 16  Temp: 98.7 F (37.1 C)  TempSrc: Oral  SpO2: 100%  Weight: 227 lb (103 kg)   Height: 5\' 3"  (1.6 m)    Body mass index is 40.21 kg/m.  Physical Exam  Constitutional: Patient appears well-developed and well-nourished. Obese  No distress.  HEENT: head atraumatic, normocephalic, pupils equal and reactive to light,, neck supple Cardiovascular: Normal rate, regular rhythm and normal heart sounds.  No murmur heard. No BLE edema. Pulmonary/Chest: Effort normal and breath sounds normal. No respiratory distress. Abdominal: Soft.  There is no tenderness. Psychiatric: Patient has a normal mood and affect. behavior is normal. Judgment and thought content normal.    PHQ2/9: Depression screen [facility] 2/9 06/17/2021 12/17/2020 10/30/2020 06/17/2020 03/13/2020  Decreased Interest 0 1 1 1  0  Down, Depressed, Hopeless 0 2 0 0 0  PHQ - 2 Score 0 3 1 1  0  Altered sleeping 0 3 0 0 -  Tired, decreased energy 0 3 0 3 -  Change in appetite 0 0 0 0 -  Feeling bad or failure about yourself  0 1 0 0 -  Trouble concentrating 0 1 0 0 -  Moving slowly or fidgety/restless 0 0 0 0 -  Suicidal thoughts 0 0 0 0 -  PHQ-9 Score 0 11 1 4  -  Difficult doing work/chores Not difficult at all - Not difficult at all - -  Some encounter information is confidential and restricted. Go to Review Flowsheets activity to see all data.  Some recent data might be hidden    phq 9 is negative   Fall Risk: Fall Risk  06/17/2021 12/17/2020 10/30/2020 06/17/2020 03/13/2020  Falls in the past year? 1 0 0 0 0  Number falls in past yr: 0 0 0 0 0  Injury with Fall? 1 0 0 0 0  Risk for fall due to : Impaired balance/gait No Fall Risks - - -  Follow up Falls prevention discussed Falls prevention discussed - - -      Functional Status Survey: Is the patient deaf or have difficulty hearing?: Yes Does the patient have difficulty seeing, even when wearing glasses/contacts?: Yes Does the patient have difficulty concentrating, remembering, or making decisions?: No Does the patient have difficulty walking or climbing  stairs?: No Does the patient  have difficulty dressing or bathing?: No Does the patient have difficulty doing errands alone such as visiting a doctor's office or shopping?: No    Assessment & Plan  1. Tinnitus of right ear  Under the care of neurologist, possible intracranial hypertension, will have LP done soon   2. GAD (generalized anxiety disorder)   3. Morbid obesity ([place])  Discussed with the patient the risk posed by an increased BMI. Discussed importance of portion control, calorie counting and at least 150 minutes of physical activity weekly. Avoid sweet beverages and drink more water. Eat at least 6 servings of fruit and vegetables daily   4. Migraine without aura and without status migrainosus, not intractable  Doing better  5. Tachycardia  She will try taking half dose of metoprolol  6. Binge eating disorder  Off medication but doing better lately   7. Dyslipidemia   8. 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 Hidradenitis suppurativa  Doing well at this time  10. Recurrent major depressive disorder, in partial remission ([place])   11. Lymphedema   12. B12 deficiency  - cyanocobalamin ((VITAMIN B-12)) injection 1,000 mcg  13. Insulin resistance  - metFORMIN (GLUCOPHAGE-XR) 750 MG 24 hr tablet; Take 1 tablet (750 mg total) by mouth daily with breakfast.  Dispense: 90 tablet; Refill: 1

## 2021-06-17 ENCOUNTER — Ambulatory Visit (INDEPENDENT_AMBULATORY_CARE_PROVIDER_SITE_OTHER): Admitting: Family Medicine

## 2021-06-17 ENCOUNTER — Ambulatory Visit: Admitting: Family Medicine

## 2021-06-17 ENCOUNTER — Encounter: Payer: Self-pay | Admitting: Family Medicine

## 2021-06-17 VITALS — BP 118/76 | HR 105 | Temp 98.7°F | Resp 16 | Ht 63.0 in | Wt 227.0 lb

## 2021-06-17 DIAGNOSIS — F411 Generalized anxiety disorder: Secondary | ICD-10-CM

## 2021-06-17 DIAGNOSIS — E538 Deficiency of other specified B group vitamins: Secondary | ICD-10-CM | POA: Diagnosis not present

## 2021-06-17 DIAGNOSIS — E88819 Insulin resistance, unspecified: Secondary | ICD-10-CM

## 2021-06-17 DIAGNOSIS — H9311 Tinnitus, right ear: Secondary | ICD-10-CM

## 2021-06-17 DIAGNOSIS — E785 Hyperlipidemia, unspecified: Secondary | ICD-10-CM

## 2021-06-17 DIAGNOSIS — E8881 Metabolic syndrome: Secondary | ICD-10-CM

## 2021-06-17 DIAGNOSIS — F50819 Binge eating disorder, unspecified: Secondary | ICD-10-CM

## 2021-06-17 DIAGNOSIS — F5081 Binge eating disorder: Secondary | ICD-10-CM

## 2021-06-17 DIAGNOSIS — G43009 Migraine without aura, not intractable, without status migrainosus: Secondary | ICD-10-CM | POA: Diagnosis not present

## 2021-06-17 DIAGNOSIS — R2990 Unspecified symptoms and signs involving the nervous system: Secondary | ICD-10-CM

## 2021-06-17 DIAGNOSIS — F3340 Major depressive disorder, recurrent, in remission, unspecified: Secondary | ICD-10-CM

## 2021-06-17 DIAGNOSIS — U099 Post covid-19 condition, unspecified: Secondary | ICD-10-CM

## 2021-06-17 DIAGNOSIS — R000 Tachycardia, unspecified: Secondary | ICD-10-CM

## 2021-06-17 DIAGNOSIS — I890 Lymphedema, not elsewhere classified: Secondary | ICD-10-CM

## 2021-06-17 DIAGNOSIS — L732 Hidradenitis suppurativa: Secondary | ICD-10-CM

## 2021-06-17 MED ORDER — METFORMIN HCL ER 750 MG PO TB24: 750.0000 mg | ORAL_TABLET | Freq: Every day | ORAL | 1 refills | Status: DC

## 2021-06-17 MED ORDER — CYANOCOBALAMIN 1000 MCG/ML IJ SOLN
1000.0000 ug | Freq: Once | INTRAMUSCULAR | Status: AC
  Administered 2021-06-17: 1000 ug via INTRAMUSCULAR

## 2021-06-19 ENCOUNTER — Ambulatory Visit: Payer: Self-pay | Admitting: Dermatology

## 2021-07-02 ENCOUNTER — Other Ambulatory Visit: Payer: Self-pay | Admitting: Psychiatry

## 2021-07-02 DIAGNOSIS — F3342 Major depressive disorder, recurrent, in full remission: Secondary | ICD-10-CM

## 2021-07-02 DIAGNOSIS — F411 Generalized anxiety disorder: Secondary | ICD-10-CM

## 2021-07-03 ENCOUNTER — Telehealth: Payer: Self-pay

## 2021-07-03 MED ORDER — VENLAFAXINE HCL ER 75 MG PO CP24: ORAL_CAPSULE | ORAL | 0 refills | Status: DC

## 2021-07-03 MED ORDER — HYDROXYZINE HCL 10 MG PO TABS: 10.0000 mg | ORAL_TABLET | Freq: Every evening | ORAL | 0 refills | Status: DC | PRN

## 2021-07-03 NOTE — Telephone Encounter (Signed)
I have sent hydroxyzine and Effexor to pharmacy for 90 days. ?

## 2021-07-03 NOTE — Telephone Encounter (Signed)
pt left a message that she needs a 90 day supply of the effexor. and also a refill on the hydroxyzine ?

## 2021-07-10 ENCOUNTER — Ambulatory Visit: Admitting: Family Medicine

## 2021-08-18 ENCOUNTER — Other Ambulatory Visit: Payer: Self-pay | Admitting: Student

## 2021-08-18 DIAGNOSIS — G932 Benign intracranial hypertension: Secondary | ICD-10-CM

## 2021-09-05 ENCOUNTER — Ambulatory Visit
Admission: RE | Admit: 2021-09-05 | Discharge: 2021-09-05 | Disposition: A | Discharge status: Home / Self Care | Source: Ambulatory Visit | Attending: Student | Admitting: Student

## 2021-09-05 DIAGNOSIS — G932 Benign intracranial hypertension: Secondary | ICD-10-CM | POA: Diagnosis present

## 2021-09-05 LAB — CSF CELL COUNT WITH DIFFERENTIAL
Eosinophils, CSF: 0 %
Lymphs, CSF: 84 %
Monocyte-Macrophage-Spinal Fluid: 13 %
RBC Count, CSF: 30 /mm3 — ABNORMAL HIGH (ref 0–3)
Segmented Neutrophils-CSF: 3 %
Tube #: 3
WBC, CSF: 2 /mm3 (ref 0–5)

## 2021-09-05 LAB — PROTEIN, CSF: Total  Protein, CSF: 25 mg/dL (ref 15–45)

## 2021-09-05 LAB — GLUCOSE, CSF: Glucose, CSF: 61 mg/dL (ref 40–70)

## 2021-09-05 MED ORDER — LIDOCAINE HCL (PF) 1 % IJ SOLN
10.0000 mL | Freq: Once | INTRAMUSCULAR | Status: AC
  Administered 2021-09-05: 10 mL
  Filled 2021-09-05: qty 10

## 2021-09-05 NOTE — OR Nursing (Signed)
Pt reports no chance she is pregnant, she has IUD and celibacy unable to produce urine to void, [employer] said pt can sign pregnancy waiver,

## 2021-09-05 NOTE — Procedures (Addendum)
Technically successful fluoro guided LP at L2-L3 level with opening pressure of 21 cm H2O and closing pressure of 20 cm H2O  11.5 cc of clear, colorless CSF sent to lab for analysis.  No immediate post procedural complication.  Please see imaging section of Epic for full dictation.    Narda Rutherford, AGNP-BC 09/05/2021, 10:22 AM

## 2021-09-07 ENCOUNTER — Emergency Department
Admission: EM | Admit: 2021-09-07 | Discharge: 2021-09-07 | Discharge status: Against Medical Advice | Attending: Emergency Medicine | Admitting: Emergency Medicine

## 2021-09-07 ENCOUNTER — Encounter: Payer: Self-pay | Admitting: Emergency Medicine

## 2021-09-07 DIAGNOSIS — M545 Low back pain, unspecified: Secondary | ICD-10-CM | POA: Insufficient documentation

## 2021-09-07 DIAGNOSIS — L509 Urticaria, unspecified: Secondary | ICD-10-CM | POA: Diagnosis not present

## 2021-09-07 DIAGNOSIS — R519 Headache, unspecified: Secondary | ICD-10-CM | POA: Insufficient documentation

## 2021-09-07 DIAGNOSIS — Z5321 Procedure and treatment not carried out due to patient leaving prior to being seen by health care provider: Secondary | ICD-10-CM | POA: Diagnosis not present

## 2021-09-07 LAB — COMPREHENSIVE METABOLIC PANEL
ALT: 16 U/L (ref 0–44)
AST: 17 U/L (ref 15–41)
Albumin: 3.6 g/dL (ref 3.5–5.0)
Alkaline Phosphatase: 60 U/L (ref 38–126)
Anion gap: 6 (ref 5–15)
BUN: 11 mg/dL (ref 6–20)
CO2: 21 mmol/L — ABNORMAL LOW (ref 22–32)
Calcium: 9 mg/dL (ref 8.9–10.3)
Chloride: 112 mmol/L — ABNORMAL HIGH (ref 98–111)
Creatinine, Ser: 1.06 mg/dL — ABNORMAL HIGH (ref 0.44–1.00)
GFR, Estimated: >60 mL/min (ref >60)
Glucose, Bld: 114 mg/dL — ABNORMAL HIGH (ref 70–99)
Potassium: 3.8 mmol/L (ref 3.5–5.1)
Sodium: 139 mmol/L (ref 135–145)
Total Bilirubin: 0.5 mg/dL (ref 0.3–1.2)
Total Protein: 7.2 g/dL (ref 6.5–8.1)

## 2021-09-07 LAB — CBC WITH DIFFERENTIAL/PLATELET
Abs Immature Granulocytes: 0.13 10*3/uL — ABNORMAL HIGH (ref 0.00–0.07)
Basophils Absolute: 0 10*3/uL (ref 0.0–0.1)
Basophils Relative: 0 %
Eosinophils Absolute: 0 10*3/uL (ref 0.0–0.5)
Eosinophils Relative: 0 %
HCT: 39.2 % (ref 36.0–46.0)
Hemoglobin: 12.8 g/dL (ref 12.0–15.0)
Immature Granulocytes: 1 %
Lymphocytes Relative: 32 %
Lymphs Abs: 4.2 10*3/uL — ABNORMAL HIGH (ref 0.7–4.0)
MCH: 29.7 pg (ref 26.0–34.0)
MCHC: 32.7 g/dL (ref 30.0–36.0)
MCV: 91 fL (ref 80.0–100.0)
Monocytes Absolute: 0.7 10*3/uL (ref 0.1–1.0)
Monocytes Relative: 5 %
Neutro Abs: 7.9 10*3/uL — ABNORMAL HIGH (ref 1.7–7.7)
Neutrophils Relative %: 62 %
Platelets: 396 10*3/uL (ref 150–400)
RBC: 4.31 MIL/uL (ref 3.87–5.11)
RDW: 14 % (ref 11.5–15.5)
WBC: 12.9 10*3/uL — ABNORMAL HIGH (ref 4.0–10.5)
nRBC: 0 % (ref 0.0–0.2)

## 2021-09-07 MED ORDER — ACETAMINOPHEN 325 MG PO TABS
650.0000 mg | ORAL_TABLET | Freq: Once | ORAL | Status: AC
  Administered 2021-09-07: 650 mg via ORAL
  Filled 2021-09-07: qty 2

## 2021-09-07 MED ORDER — ONDANSETRON 4 MG PO TBDP
4.0000 mg | ORAL_TABLET | Freq: Once | ORAL | Status: AC
  Administered 2021-09-07: 4 mg via ORAL
  Filled 2021-09-07: qty 1

## 2021-09-07 NOTE — ED Triage Notes (Signed)
Pt ambulatory to triage with complaints of headache for the last 3 days. She notes having an LP on Friday here and the headache mildly improved and since that time it has progressed. She states she experiences more pain when she stands up and walks around. Of note, she has been developing hives over the last week with eye swelling. Denies incontinence or back pain.

## 2021-09-08 ENCOUNTER — Other Ambulatory Visit: Payer: Self-pay | Admitting: Interventional Radiology

## 2021-09-08 ENCOUNTER — Ambulatory Visit: Admitting: Psychiatry

## 2021-09-08 DIAGNOSIS — G970 Cerebrospinal fluid leak from spinal puncture: Secondary | ICD-10-CM

## 2021-09-08 LAB — IGG CSF INDEX
Albumin CSF-mCnc: 14 mg/dL (ref 7–29)
Albumin: 3.9 g/dL (ref 3.8–4.8)
CSF IgG Index: 0.6 (ref 0.0–0.7)
IgG (Immunoglobin G), Serum: 1464 mg/dL (ref 586–1602)
IgG, CSF: 2.9 mg/dL (ref 0.0–6.7)
IgG/Alb Ratio, CSF: 0.21 (ref 0.00–0.25)

## 2021-09-09 ENCOUNTER — Ambulatory Visit
Admission: RE | Admit: 2021-09-09 | Discharge: 2021-09-09 | Disposition: A | Discharge status: Home / Self Care | Source: Ambulatory Visit | Attending: Neurology | Admitting: Neurology

## 2021-09-09 ENCOUNTER — Other Ambulatory Visit: Payer: Self-pay | Admitting: Neurology

## 2021-09-09 DIAGNOSIS — G9600 Cerebrospinal fluid leak, unspecified: Secondary | ICD-10-CM

## 2021-09-09 MED ORDER — IOPAMIDOL (ISOVUE-M 200) INJECTION 41%
1.0000 mL | Freq: Once | INTRAMUSCULAR | Status: AC
  Administered 2021-09-09: 1 mL via EPIDURAL

## 2021-09-09 NOTE — Discharge Instructions (Signed)
[facility] Patch Discharge Instructions  Go home and rest quietly for the next 24 hours.  It is important to lie flat for the next 24 hours.  Get up only to go to the restroom.  You may lie in the bed or on a couch on your back, your stomach, your left side or your right side.  You may have one pillow under your head.  You may have pillows between your knees while you are on your side or under your knees while you are on your back.  DO NOT drive today.  Recline the seat as far back as it will go, while still wearing your seat belt, on the way home.  You may get up to go to the bathroom as needed.  You may sit up for 10 minutes to eat.  You may resume your normal diet and medications unless otherwise indicated.  Drink lots of extra fluids today and tomorrow..   You may resume normal activities after your 24 hours of bed rest is over; however, do not exert yourself strongly or do any heavy lifting tomorrow.  Call your physician for a follow-up appointment.   If you have any questions  after you arrive home, please call [phone].  Discharge instructions have been explained to the patient.  The patient, or the person responsible for the patient, fully understands these instructions.

## 2021-09-09 NOTE — Progress Notes (Signed)
15cc of blood successfully removed from pt. On procedure table and immediately handed to Dr. Denna Haggard.

## 2021-09-10 LAB — OLIGOCLONAL BANDS, CSF + SERM

## 2021-09-18 ENCOUNTER — Inpatient Hospital Stay: Admitting: Internal Medicine

## 2021-09-18 NOTE — Progress Notes (Deleted)
   Established Patient Office Visit  Subjective   Patient ID: Olivia King, female    DOB: [birth year]  Age: 36 y.o. MRN: [id number]  No chief complaint on file.   HPI  Olivia King is a 36 year old female here for ER follow up.   Discharge Date: 09/07/21 Hospital/facility: [facility] Diagnosis: Headache after LP 3 days prior  Procedures/tests: *** Consultants: *** New medications: *** Discontinued medications: *** Discharge instructions:  *** Status: {Blank multiple:19196::"better","worse","stable","fluctuating"}  {History (Optional):23778}  ROS    Objective:     LMP 08/25/2021 Comment: preg test signed {Vitals History (Optional):23777}  Physical Exam   No results found for any visits on 09/18/21.  {Labs (Optional):23779}  The ASCVD Risk score (Arnett DK, et al., 2019) failed to calculate for the following reasons:   The 2019 ASCVD risk score is only valid for ages 45 to 69    Assessment & Plan:   Problem List Items Addressed This Visit   None   No follow-ups on file.    Teodora Medici, DO

## 2021-10-03 ENCOUNTER — Ambulatory Visit: Admitting: Cardiology

## 2021-10-08 ENCOUNTER — Other Ambulatory Visit: Payer: Self-pay | Admitting: Psychiatry

## 2021-10-08 DIAGNOSIS — F411 Generalized anxiety disorder: Secondary | ICD-10-CM

## 2021-10-23 ENCOUNTER — Telehealth (INDEPENDENT_AMBULATORY_CARE_PROVIDER_SITE_OTHER): Admitting: Psychiatry

## 2021-10-23 ENCOUNTER — Encounter: Payer: Self-pay | Admitting: Psychiatry

## 2021-10-23 DIAGNOSIS — F3342 Major depressive disorder, recurrent, in full remission: Secondary | ICD-10-CM | POA: Diagnosis not present

## 2021-10-23 DIAGNOSIS — F411 Generalized anxiety disorder: Secondary | ICD-10-CM

## 2021-10-23 DIAGNOSIS — G4700 Insomnia, unspecified: Secondary | ICD-10-CM | POA: Insufficient documentation

## 2021-10-23 DIAGNOSIS — F17200 Nicotine dependence, unspecified, uncomplicated: Secondary | ICD-10-CM | POA: Diagnosis not present

## 2021-10-23 MED ORDER — TRAZODONE HCL 50 MG PO TABS: 25.0000 mg | ORAL_TABLET | Freq: Every evening | ORAL | 1 refills | Status: DC | PRN

## 2021-10-23 MED ORDER — HYDROXYZINE HCL 10 MG PO TABS: 10.0000 mg | ORAL_TABLET | Freq: Every evening | ORAL | 0 refills | Status: DC | PRN

## 2021-10-23 MED ORDER — VENLAFAXINE HCL ER 75 MG PO CP24: ORAL_CAPSULE | ORAL | 0 refills | Status: DC

## 2021-10-23 NOTE — Progress Notes (Signed)
Virtual Visit via Video Note  I connected with Olivia King on 10/23/21 at 11:00 AM EDT by a video enabled telemedicine application and verified that I am speaking with the correct person using two identifiers.  Location Provider Location : [facility] Patient Location : Car  Participants: Patient , Provider    I discussed the limitations of evaluation and management by telemedicine and the availability of in person appointments. The patient expressed understanding and agreed to proceed.    I discussed the assessment and treatment plan with the patient. The patient was provided an opportunity to ask questions and all were answered. The patient agreed with the plan and demonstrated an understanding of the instructions.   The patient was advised to call back or seek an in-person evaluation if the symptoms worsen or if the condition fails to improve as anticipated.                                                              [place] MD OP Progress Note  10/23/2021 11:20 AM Olivia King  MRN:  [id number]  Chief Complaint:  Chief Complaint  Patient presents with   Follow-up: 36 year old female with history of depression, anxiety, with worsening anxiety and sleep problems presented for medication management.   HPI: Olivia King is a 36 year old female, employed, lives in [place], widowed, has a history of MDD, GAD, tobacco use disorder was evaluated by telemedicine today.  Patient today reports she is currently struggling with worsening sleep problems.  She reports she is worried about her son leaving for college in August.  She reports she constantly thinks about this and likely this could be affecting her sleep.  She does have hydroxyzine available which she can takes, not sure if this is beneficial or not.  She reports she goes to bed at around 10:30 PM.  She however wakes up at around 3 AM and is unable to fall back asleep for about 2 hours.  She however does fall back asleep around 5 AM  and is able to sleep till 6:30 AM.  Currently not on any sleep medication.  Agreeable to trial of trazodone.  Patient denies any suicidality, homicidality or perceptual disturbances.  Currently compliant on her medications like venlafaxine, lamotrigine.  Reports her lamotrigine dosage was readjusted by her headache provider however she did not pick up that prescription.  Her Topamax was also readjusted recently and she is currently on a higher dosage.  This is for weight loss.  It also helps with her headaches.  Patient denies any other concerns today.  Visit Diagnosis:    ICD-10-CM   1. MDD (major depressive disorder), recurrent, in full remission ([place])  F33.42 venlafaxine XR (EFFEXOR-XR) 75 MG 24 hr capsule    2. GAD (generalized anxiety disorder)  F41.1 hydrOXYzine (ATARAX) 10 MG tablet    3. Insomnia, unspecified type  G47.00 traZODone (DESYREL) 50 MG tablet    4. Tobacco use disorder  F17.200       Past Psychiatric History: Reviewed past psychiatric history from progress note on 10/10/2019.  Past trials of Lexapro, Vyvanse, Abilify, Paxil, Zoloft, Cymbalta.  Past Medical History:  Past Medical History:  Diagnosis Date   Cigarette smoker    COVID-19 virus detected 03/21/2019   Family history of pancreatic cancer 08/2020  MyRisk neg except SDHA VUS; IBIS=9.2%/riskscore=7.3%   IUD contraception 02/06/2016   Placed January 23, 3556 by Ardeth Perfect, PA [facility]   Major depressive disorder, recurrent ([place]) 08/19/2015   zoloft made her cry; cymbalta made her too sleepy   Migraine headache     Past Surgical History:  Procedure Laterality Date   CESAREAN SECTION  2002/07/10 &2012   contraceptive implant      Family Psychiatric History: Reviewed family psychiatric history from progress note on 10/10/2019.  Family History:  Family History  Problem Relation Age of Onset   Hypertension Mother    Pancreatic cancer Mother 31   Hypertension Brother    Aneurysm Maternal  Grandmother    Heart attack Maternal Grandfather    Hypertension Paternal Grandmother    Heart failure Sister     Social History: Reviewed social history from progress note on 10/10/2019. Social History   Socioeconomic History   Marital status: Widowed    Spouse name: Not on file   Number of children: 2   Years of education: Not on file   Highest education level: Associate degree: occupational, Hotel manager, or vocational program  Occupational History   Occupation: CMA    Comment: [employer]  Tobacco Use   Smoking status: Every Day    Years: 9.00    Types: Cigarettes    Start date: 03/05/2011   Smokeless tobacco: Never   Tobacco comments:    a pack last 2 days now - reported 05/10/2020  Vaping Use   Vaping Use: Some days   Substances: Flavoring  Substance and Sexual Activity   Alcohol use: Yes    Alcohol/week: 0.0 standard drinks of alcohol   Drug use: No   Sexual activity: Yes    Partners: Male    Birth control/protection: I.U.D.    Comment: Patient has liletta IUD  Other Topics Concern   Not on file  Social History Narrative   She is a widow, he died July 09, 2009 while he was deployed.    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 3220   Social Determinants of Health   Financial Resource Strain: Low Risk  (09/15/2019)   Overall Financial Resource Strain (CARDIA)    Difficulty of Paying Living Expenses: Not hard at all  Food Insecurity: No Food Insecurity (05/10/2019)   Hunger Vital Sign    Worried About Running Out of Food in the Last Year: Never true    Ran Out of Food in the Last Year: Never true  Transportation Needs: No Transportation Needs (05/10/2019)   PRAPARE - Hydrologist (Medical): No    Lack of Transportation (Non-Medical): No  Physical Activity: Sufficiently Active (09/15/2019)   Exercise Vital Sign    Days of Exercise per Week: 3 days    Minutes of Exercise per Session: 50 min  Stress: No Stress Concern Present (09/15/2019)   [place]    Feeling of Stress : Not at all  Social Connections: Moderately Integrated (09/15/2019)   Social Connection and Isolation Panel [NHANES]    Frequency of Communication with Friends and Family: More than three times a week    Frequency of Social Gatherings with Friends and Family: More than three times a week    Attends Religious Services: More than 4 times per year    Active Member of [employer] or Organizations: Yes    Attends Archivist Meetings: More than 4 times per year  Marital Status: Widowed    Allergies:  Allergies  Allergen Reactions   Sumatriptan Anaphylaxis    Throat and tongue swelling  Throat and tongue swelling  Throat and tongue swelling    Penicillins Rash   Vortioxetine Other (See Comments)    Tingling hands and blurred vision  Tingling hands and blurred vision  Tingling hands and blurred vision    Oxybate     Metabolic Disorder Labs: Lab Results  Component Value Date   HGBA1C 5.1 10/30/2020   MPG 100 05/10/2019   MPG 103 03/04/2018   No results found for: "PROLACTIN" Lab Results  Component Value Date   CHOL 179 12/17/2020   TRIG 69 12/17/2020   HDL 47 (L) 12/17/2020   CHOLHDL 3.8 12/17/2020   VLDL 11 03/19/2016   LDLCALC 116 (H) 12/17/2020   LDLCALC 110 (H) 04/09/2017   Lab Results  Component Value Date   TSH 2.49 12/17/2020   TSH 2.17 05/10/2019    Therapeutic Level Labs: No results found for: "LITHIUM" No results found for: "VALPROATE" No results found for: "CBMZ"  Current Medications: Current Outpatient Medications  Medication Sig Dispense Refill   clindamycin (CLEOCIN T) 1 % lotion Apply topically 2 (two) times daily. 60 mL 2   Cyanocobalamin (B-12) 1000 MCG SUBL Place 1 tablet under the tongue daily at 12 noon.     fluticasone (FLONASE) 50 MCG/ACT nasal spray Place 2 sprays into both nostrils daily.     ipratropium (ATROVENT) 0.03 % nasal spray ipratropium  bromide 21 mcg (0.03 %) nasal spray     lamoTRIgine (LAMICTAL) 25 MG tablet TAKE 1 TABLET(25 MG) BY MOUTH DAILY 90 tablet 0   Multiple Vitamin (MULTI-VITAMINS) TABS Take by mouth.     topiramate (TOPAMAX) 50 MG tablet Take 150 mg by mouth daily.     traZODone (DESYREL) 50 MG tablet Take 0.5-1 tablets (25-50 mg total) by mouth at bedtime as needed for sleep. 30 tablet 1   Vitamin D, Ergocalciferol, (DRISDOL) 1.25 MG (50000 UNIT) CAPS capsule Take 50,000 Units by mouth once a week.     hydrOXYzine (ATARAX) 10 MG tablet Take 1 tablet (10 mg total) by mouth at bedtime as needed. 90 tablet 0   venlafaxine XR (EFFEXOR-XR) 75 MG 24 hr capsule TAKE 1 CAPSULE(75 MG) BY MOUTH DAILY WITH BREAKFAST 90 capsule 0   No current facility-administered medications for this visit.     Musculoskeletal: Strength & Muscle Tone:  UTA Gait & Station:  Seated Patient leans: N/A  Psychiatric Specialty Exam: Review of Systems  Psychiatric/Behavioral:  Positive for sleep disturbance. The patient is nervous/anxious.   All other systems reviewed and are negative.   There were no vitals taken for this visit.There is no height or weight on file to calculate BMI.  General Appearance: Casual  Eye Contact:  Fair  Speech:  Clear and Coherent  Volume:  Normal  Mood:  Anxious  Affect:  Congruent  Thought Process:  Goal Directed and Descriptions of Associations: Intact  Orientation:  Full (Time, Place, and Person)  Thought Content: Logical   Suicidal Thoughts:  No  Homicidal Thoughts:  No  Memory:  Immediate;   Fair Recent;   Fair Remote;   Fair  Judgement:  Fair  Insight:  Fair  Psychomotor Activity:  Normal  Concentration:  Concentration: Fair and Attention Span: Fair  Recall:  [employer] of Knowledge: Fair  Language: Fair  Akathisia:  No  Handed:  Right  AIMS (if indicated): done  Assets:  Communication Skills Desire for Improvement Housing Social Support  ADL's:  Intact  Cognition: WNL  Sleep:   Fair   Screenings: AIMS    Flowsheet Row Video Visit from 10/23/2021 in [facility] Total Score 0      GAD-7    Flowsheet Row Video Visit from 10/23/2021 in [facility] Video Visit from 11/06/2020 in [facility] Video Visit from 09/25/2020 in [facility] Video Visit from 08/07/2020 in [facility] Video Visit from 07/15/2020 in [facility]  Total GAD-7 Score '13 14 13 4 19      '$ PHQ2-9    Flowsheet Row Video Visit from 10/23/2021 in [facility] Office Visit from 06/17/2021 in [facility] Office Visit from 06/10/2021 in [facility] Office Visit from 03/24/2021 in [facility] Office Visit from 12/17/2020 in [facility]  PHQ-2 Total Score 0 0 0 0 3  PHQ-9 Total Score -- 0 -- -- 11      Flowsheet Row Video Visit from 10/23/2021 in [facility] ED from 09/07/2021 in [place] Office Visit from 06/10/2021 in [place] [facility] No Risk No Risk No Risk        Assessment and Plan: Olivia King is a 36 year old female, widowed, lives in [place], employed, has a history of MDD, GAD, was evaluated by telemedicine today.  Patient with psychosocial stressors of her son leaving for college and currently with anxiety and sleep problems, will benefit from the following plan.  Plan MDD in remission Venlafaxine 75 mg p.o. daily Lamotrigine 25 mg p.o. daily  GAD-unstable Discussed referral for CBT.  Patient declines. Continue venlafaxine 75 mg p.o. daily for now.  We will consider increasing the dosage. Continue hydroxyzine 10 mg as needed for severe anxiety  symptoms.  Insomnia-unstable Discussed sleep hygiene techniques. Start trazodone 25-50 mg p.o. nightly as needed  Tobacco use disorder-unstable Provided counseling for 2 minutes. Discussed NRT.   Follow-up in clinic in 4 weeks or sooner if needed.    Collaboration of Care: Collaboration of Care: Referral or follow-up with counselor/therapist AEB encouraged to schedule an appointment with therapist.  Patient/Guardian was advised Release of Information must be obtained prior to any record release in order to collaborate their care with an outside provider. Patient/Guardian was advised if they have not already done so to contact the registration department to sign all necessary forms in order for [place] to release information regarding their care.   Consent: Patient/Guardian gives verbal consent for treatment and assignment of benefits for services provided during this visit. Patient/Guardian expressed understanding and agreed to proceed.   This note was generated in part or whole with voice recognition software. Voice recognition is usually quite accurate but there are transcription errors that can and very often do occur. I apologize for any typographical errors that were not detected and corrected.      Ursula Alert, MD 10/24/2021, 8:28 AM

## 2021-10-23 NOTE — Patient Instructions (Signed)
Trazodone Tablets What is this medication? TRAZODONE (TRAZ oh done) treats depression. It increases the amount of serotonin in the brain, a hormone that helps regulate mood. This medicine may be used for other purposes; ask your health care provider or pharmacist if you have questions. COMMON BRAND NAME(S): Desyrel What should I tell my care team before I take this medication? They need to know if you have any of these conditions: Attempted suicide or thinking about it Bipolar disorder Bleeding problems Glaucoma Heart disease, or previous heart attack Irregular heart beat Kidney or liver disease Low levels of sodium in the blood An unusual or allergic reaction to trazodone, other medications, foods, dyes or preservatives Pregnant or trying to get pregnant Breast-feeding How should I use this medication? Take this medication by mouth with a glass of water. Follow the directions on the prescription label. Take this medication shortly after a meal or a light snack. Take your medication at regular intervals. Do not take your medication more often than directed. Do not stop taking this medication suddenly except upon the advice of your care team. Stopping this medication too quickly may cause serious side effects or your condition may worsen. A special MedGuide will be given to you by the pharmacist with each prescription and refill. Be sure to read this information carefully each time. Talk to your care team regarding the use of this medication in children. Special care may be needed. Overdosage: If you think you have taken too much of this medicine contact a poison control center or emergency room at once. NOTE: This medicine is only for you. Do not share this medicine with others. What if I miss a dose? If you miss a dose, take it as soon as you can. If it is almost time for your next dose, take only that dose. Do not take double or extra doses. What may interact with this medication? Do not  take this medication with any of the following: Certain medications for fungal infections like fluconazole, itraconazole, ketoconazole, posaconazole, voriconazole Cisapride Dronedarone Linezolid MAOIs like Carbex, Eldepryl, Marplan, Nardil, and Parnate Mesoridazine Methylene blue (injected into a vein) Pimozide Saquinavir Thioridazine This medication may also interact with the following: Alcohol Antiviral medications for HIV or AIDS Aspirin and aspirin-like medications Barbiturates like phenobarbital Certain medications for blood pressure, heart disease, irregular heart beat Certain medications for depression, anxiety, or psychotic disturbances Certain medications for migraine headache like almotriptan, eletriptan, frovatriptan, naratriptan, rizatriptan, sumatriptan, zolmitriptan Certain medications for seizures like carbamazepine and phenytoin Certain medications for sleep Certain medications that treat or prevent blood clots like dalteparin, enoxaparin, warfarin Digoxin Fentanyl Lithium NSAIDS, medications for pain and inflammation, like ibuprofen or naproxen Other medications that prolong the QT interval (cause an abnormal heart rhythm) like dofetilide Rasagiline Supplements like St. John's wort, kava kava, valerian Tramadol Tryptophan This list may not describe all possible interactions. Give your health care provider a list of all the medicines, herbs, non-prescription drugs, or dietary supplements you use. Also tell them if you smoke, drink alcohol, or use illegal drugs. Some items may interact with your medicine. What should I watch for while using this medication? Tell your care team if your symptoms do not get better or if they get worse. Visit your care team for regular checks on your progress. Because it may take several weeks to see the full effects of this medication, it is important to continue your treatment as prescribed by your care team. Watch for new or worsening  thoughts of  suicide or depression. This includes sudden changes in mood, behaviors, or thoughts. These changes can happen at any time but are more common in the beginning of treatment or after a change in dose. Call your care team right away if you experience these thoughts or worsening depression. Manic episodes may happen in patients with bipolar disorder who take this medication. Watch for changes in feelings or behaviors such as feeling anxious, nervous, agitated, panicky, irritable, hostile, aggressive, impulsive, severely restless, overly excited and hyperactive, or trouble sleeping. These changes can happen at any time but are more common in the beginning of treatment or after a change in dose. Call your care team right away if you notice any of these symptoms. You may get drowsy or dizzy. Do not drive, use machinery, or do anything that needs mental alertness until you know how this medication affects you. Do not stand or sit up quickly, especially if you are an older patient. This reduces the risk of dizzy or fainting spells. Alcohol may interfere with the effect of this medication. Avoid alcoholic drinks. This medication may cause dry eyes and blurred vision. If you wear contact lenses you may feel some discomfort. Lubricating drops may help. See your eye doctor if the problem does not go away or is severe. Your mouth may get dry. Chewing sugarless gum, sucking hard candy and drinking plenty of water may help. Contact your care team if the problem does not go away or is severe. What side effects may I notice from receiving this medication? Side effects that you should report to your care team as soon as possible: Allergic reactions--skin rash, itching, hives, swelling of the face, lips, tongue, or throat Bleeding--bloody or black, tar-like stools, red or dark brown urine, vomiting blood or brown material that looks like coffee grounds, small, red or purple spots on skin, unusual bleeding or  bruising Heart rhythm changes--fast or irregular heartbeat, dizziness, feeling faint or lightheaded, chest pain, trouble breathing Low blood pressure--dizziness, feeling faint or lightheaded, blurry vision Low sodium level--muscle weakness, fatigue, dizziness, headache, confusion Prolonged or painful erection Serotonin syndrome--irritability, confusion, fast or irregular heartbeat, muscle stiffness, twitching muscles, sweating, high fever, seizures, chills, vomiting, diarrhea Sudden eye pain or change in vision such as blurry vision, seeing halos around lights, vision loss Thoughts of suicide or self-harm, worsening mood, feelings of depression Side effects that usually do not require medical attention (report to your care team if they continue or are bothersome): Change in sex drive or performance Constipation Dizziness Drowsiness Dry mouth This list may not describe all possible side effects. Call your doctor for medical advice about side effects. You may report side effects to FDA at 1-800-FDA-1088. Where should I keep my medication? Keep out of the reach of children and pets. Store at room temperature between 15 and 30 degrees C (59 to 86 degrees F). Protect from light. Keep container tightly closed. Throw away any unused medication after the expiration date. NOTE: This sheet is a summary. It may not cover all possible information. If you have questions about this medicine, talk to your doctor, pharmacist, or health care provider.  2023 Elsevier/Gold Standard (2020-03-27 00:00:00)  

## 2021-11-26 ENCOUNTER — Encounter: Payer: Self-pay | Admitting: Otolaryngology

## 2021-11-27 ENCOUNTER — Telehealth (INDEPENDENT_AMBULATORY_CARE_PROVIDER_SITE_OTHER): Admitting: Psychiatry

## 2021-11-27 ENCOUNTER — Encounter: Payer: Self-pay | Admitting: Psychiatry

## 2021-11-27 DIAGNOSIS — F411 Generalized anxiety disorder: Secondary | ICD-10-CM

## 2021-11-27 DIAGNOSIS — F3342 Major depressive disorder, recurrent, in full remission: Secondary | ICD-10-CM | POA: Diagnosis not present

## 2021-11-27 DIAGNOSIS — G4700 Insomnia, unspecified: Secondary | ICD-10-CM

## 2021-11-27 DIAGNOSIS — F17200 Nicotine dependence, unspecified, uncomplicated: Secondary | ICD-10-CM

## 2021-11-27 NOTE — Progress Notes (Signed)
Virtual Visit via Video Note  I connected with Olivia King on 11/27/21 at  4:40 PM EDT by a video enabled telemedicine application and verified that I am speaking with the correct person using two identifiers.  Location Provider Location : [facility] Patient Location : Car  Participants: Patient , Provider    I discussed the limitations of evaluation and management by telemedicine and the availability of in person appointments. The patient expressed understanding and agreed to proceed.   I discussed the assessment and treatment plan with the patient. The patient was provided an opportunity to ask questions and all were answered. The patient agreed with the plan and demonstrated an understanding of the instructions.   The patient was advised to call back or seek an in-person evaluation if the symptoms worsen or if the condition fails to improve as anticipated.    [facility] MD OP Progress Note  11/28/2021 2:26 PM Olivia King  MRN:  [id number]  Chief Complaint:  Chief Complaint  Patient presents with   Follow-up: 36 year old female with history of depression, anxiety, presented for medication management.   HPI: Olivia King is a 36 year old female, currently unemployed, lives in [place], widowed, has a history of MDD, GAD, tobacco use disorder was evaluated by telemedicine today.  Patient today reports she lost her job at [employer].  She was fired.  Patient reports she got anxious about her son not picking up her daughter who was at a camp.  She got worried about her son, thought something happened to him and left her work for 7 minutes to pick up her daughter.  Patient reports she hence was fired since she did not let anyone know before going out.  Patient reports she does not worry too much about her job at this time.  She is at peace with herself.  Currently applying for new jobs.  Patient reports sleep has improved and currently does not take the trazodone.  Patient denies any  sadness, hopelessness, lack of motivation and low energy.  She denies any suicidality, homicidality or perceptual disturbances.  Patient reports she is compliant on her medications.  Denies side effects.  Reports she was recently prescribed a higher dosage of hydroxyzine since she was breaking out in hives due to an allergic reaction.  Patient also had COVID-19 infection few weeks ago, was mostly asymptomatic.  Patient denies any other concerns today.  Visit Diagnosis:    ICD-10-CM   1. MDD (major depressive disorder), recurrent, in full remission ([place])  F33.42     2. GAD (generalized anxiety disorder)  F41.1     3. Insomnia, unspecified type  G47.00     4. Tobacco use disorder  F17.200       Past Psychiatric History: Reviewed past psychiatric history from progress note on 10/10/2019.  Past trials of medications like Lexapro, Vyvanse, Abilify, Paxil, Zoloft, Cymbalta.  Past Medical History:  Past Medical History:  Diagnosis Date   Cigarette smoker    COVID-19 virus detected 03/21/2019   Family history of pancreatic cancer 08/2020   MyRisk neg except SDHA VUS; IBIS=9.2%/riskscore=7.3%   IUD contraception 02/06/2016   Placed January 22, 4097 by Ardeth Perfect, PA [facility]   Major depressive disorder, recurrent ([place]) 08/19/2015   zoloft made her cry; cymbalta made her too sleepy   Migraine headache     Past Surgical History:  Procedure Laterality Date   CESAREAN SECTION  2004 &2012   contraceptive implant      Family Psychiatric History: Reviewed  family psychiatric history from progress note on 10/10/2019.  Family History:  Family History  Problem Relation Age of Onset   Hypertension Mother    Pancreatic cancer Mother 39   Hypertension Brother    Aneurysm Maternal Grandmother    Heart attack Maternal Grandfather    Hypertension Paternal Grandmother    Heart failure Sister     Social History: Reviewed social history from progress note on 10/10/2019. Social  History   Socioeconomic History   Marital status: Widowed    Spouse name: Not on file   Number of children: 2   Years of education: Not on file   Highest education level: Associate degree: occupational, Hotel manager, or vocational program  Occupational History   Not on file  Tobacco Use   Smoking status: Every Day    Years: 9.00    Types: Cigarettes    Start date: 03/05/2011   Smokeless tobacco: Never   Tobacco comments:    a pack last 2 days now - reported 05/10/2020  Vaping Use   Vaping Use: Some days   Substances: Flavoring  Substance and Sexual Activity   Alcohol use: Yes    Alcohol/week: 0.0 standard drinks of alcohol   Drug use: No   Sexual activity: Yes    Partners: Male    Birth control/protection: I.U.D.    Comment: Patient has liletta IUD  Other Topics Concern   Not on file  Social History Narrative   She is a widow, he died 07/15/2009 while he was deployed.    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   Social Determinants of Health   Financial Resource Strain: Low Risk  (09/15/2019)   Overall Financial Resource Strain (CARDIA)    Difficulty of Paying Living Expenses: Not hard at all  Food Insecurity: No Food Insecurity (05/10/2019)   Hunger Vital Sign    Worried About Running Out of Food in the Last Year: Never true    Ran Out of Food in the Last Year: Never true  Transportation Needs: No Transportation Needs (05/10/2019)   PRAPARE - Hydrologist (Medical): No    Lack of Transportation (Non-Medical): No  Physical Activity: Sufficiently Active (09/15/2019)   Exercise Vital Sign    Days of Exercise per Week: 3 days    Minutes of Exercise per Session: 50 min  Stress: No Stress Concern Present (09/15/2019)   [place]    Feeling of Stress : Not at all  Social Connections: Moderately Integrated (09/15/2019)   Social Connection and Isolation Panel [NHANES]    Frequency of  Communication with Friends and Family: More than three times a week    Frequency of Social Gatherings with Friends and Family: More than three times a week    Attends Religious Services: More than 4 times per year    Active Member of [employer] or Organizations: Yes    Attends Archivist Meetings: More than 4 times per year    Marital Status: Widowed    Allergies:  Allergies  Allergen Reactions   Sumatriptan Anaphylaxis    Throat and tongue swelling  Throat and tongue swelling  Throat and tongue swelling    Penicillins Rash   Vortioxetine Other (See Comments)    Tingling hands and blurred vision  Tingling hands and blurred vision  Tingling hands and blurred vision    Oxybate     Metabolic Disorder Labs: Lab Results  Component Value Date  HGBA1C 5.1 10/30/2020   MPG 100 05/10/2019   MPG 103 03/04/2018   No results found for: "PROLACTIN" Lab Results  Component Value Date   CHOL 179 12/17/2020   TRIG 69 12/17/2020   HDL 47 (L) 12/17/2020   CHOLHDL 3.8 12/17/2020   VLDL 11 03/19/2016   LDLCALC 116 (H) 12/17/2020   LDLCALC 110 (H) 04/09/2017   Lab Results  Component Value Date   TSH 2.49 12/17/2020   TSH 2.17 05/10/2019    Therapeutic Level Labs: No results found for: "LITHIUM" No results found for: "VALPROATE" No results found for: "CBMZ"  Current Medications: Current Outpatient Medications  Medication Sig Dispense Refill   clindamycin (CLEOCIN T) 1 % lotion Apply topically 2 (two) times daily. 60 mL 2   Cyanocobalamin (B-12) 1000 MCG SUBL Place 1 tablet under the tongue daily at 12 noon.     EPINEPHrine 0.3 mg/0.3 mL IJ SOAJ injection Inject into the muscle.     fluticasone (FLONASE) 50 MCG/ACT nasal spray Place 2 sprays into both nostrils daily.     hydrOXYzine (VISTARIL) 25 MG capsule SMARTSIG:1-2 Capsule(s) By Mouth Every 6-8 Hours PRN     ipratropium (ATROVENT) 0.03 % nasal spray ipratropium bromide 21 mcg (0.03 %) nasal spray     lamoTRIgine  (LAMICTAL) 25 MG tablet TAKE 1 TABLET(25 MG) BY MOUTH DAILY 90 tablet 0   metroNIDAZOLE (FLAGYL) 500 MG tablet Take 500 mg by mouth 2 (two) times daily.     Multiple Vitamin (MULTI-VITAMINS) TABS Take by mouth.     topiramate (TOPAMAX) 50 MG tablet Take 150 mg by mouth daily.     traZODone (DESYREL) 50 MG tablet Take 0.5-1 tablets (25-50 mg total) by mouth at bedtime as needed for sleep. 30 tablet 1   venlafaxine XR (EFFEXOR-XR) 75 MG 24 hr capsule TAKE 1 CAPSULE(75 MG) BY MOUTH DAILY WITH BREAKFAST 90 capsule 0   Vitamin D, Ergocalciferol, (DRISDOL) 1.25 MG (50000 UNIT) CAPS capsule Take 50,000 Units by mouth once a week.     No current facility-administered medications for this visit.     Musculoskeletal: Strength & Muscle Tone:  UTA Gait & Station: normal Patient leans: N/A  Psychiatric Specialty Exam: Review of Systems  Psychiatric/Behavioral: Negative.    All other systems reviewed and are negative.   There were no vitals taken for this visit.There is no height or weight on file to calculate BMI.  General Appearance: Casual  Eye Contact:  Fair  Speech:  Clear and Coherent  Volume:  Normal  Mood:  Euthymic  Affect:  Congruent  Thought Process:  Goal Directed and Descriptions of Associations: Intact  Orientation:  Full (Time, Place, and Person)  Thought Content: Logical   Suicidal Thoughts:  No  Homicidal Thoughts:  No  Memory:  Immediate;   Fair Recent;   Fair Remote;   Fair  Judgement:  Fair  Insight:  Fair  Psychomotor Activity:  Normal  Concentration:  Concentration: Fair and Attention Span: Fair  Recall:  [employer] of Knowledge: Fair  Language: Fair  Akathisia:  No  Handed:  Right  AIMS (if indicated): not done  Assets:  Communication Skills Desire for Improvement Housing Social Support  ADL's:  Intact  Cognition: WNL  Sleep:  Fair   Screenings: AIMS    Flowsheet Row Video Visit from 10/23/2021 in [place] Total  Score 0      GAD-7    Flowsheet Row Video Visit from 11/27/2021 in  [place] Video Visit from 10/23/2021 in [place] Video Visit from 11/06/2020 in [place] Video Visit from 09/25/2020 in [place] Video Visit from 08/07/2020 in [place]  Total GAD-7 Score 0 '13 14 13 4      '$ PHQ2-9    Flowsheet Row Video Visit from 11/27/2021 in [place] Video Visit from 10/23/2021 in [place] Office Visit from 06/17/2021 in [facility] Office Visit from 06/10/2021 in [place] Office Visit from 03/24/2021 in [place]  PHQ-2 Total Score 0 0 0 0 0  PHQ-9 Total Score 0 -- 0 -- --      Flowsheet Row Video Visit from 11/27/2021 in [place] Video Visit from 10/23/2021 in [facility] ED from 09/07/2021 in [facility] No Risk No Risk No Risk        Assessment and Plan: Olivia King is a 36 year old female, widowed, lives in [place], has a history of MDD, GAD was evaluated by telemedicine today.  Patient with recent job loss however reports she is currently doing well with regards to her mood, discussed plan as noted below.  Plan MDD in remission Venlafaxine 75 mg p.o. daily Lamotrigine 25 mg p.o. daily  GAD-improving Continue venlafaxine 75 mg p.o. daily. Currently on a higher dosage of hydroxyzine prescribed for hives, 25 mg as needed. Could use it for anxiety as well.  Insomnia-improving Trazodone 25-50 mg p.o. nightly as needed Continue sleep hygiene techniques  Tobacco use disorder-unstable Will monitor closely  Follow-up in clinic in 2 to 3 months or sooner if  needed.    Consent: Patient/Guardian gives verbal consent for treatment and assignment of benefits for services provided during this visit. Patient/Guardian expressed understanding and agreed to proceed.   This note was generated in part or whole with voice recognition software. Voice recognition is usually quite accurate but there are transcription errors that can and very often do occur. I apologize for any typographical errors that were not detected and corrected.      Ursula Alert, MD 11/28/2021, 2:26 PM

## 2021-12-01 ENCOUNTER — Encounter: Payer: Self-pay | Admitting: *Deleted

## 2021-12-09 ENCOUNTER — Ambulatory Visit (INDEPENDENT_AMBULATORY_CARE_PROVIDER_SITE_OTHER): Admitting: Cardiology

## 2021-12-09 ENCOUNTER — Encounter: Payer: Self-pay | Admitting: Cardiology

## 2021-12-09 VITALS — BP 110/78 | HR 99 | Ht 63.0 in | Wt 229.4 lb

## 2021-12-09 DIAGNOSIS — R0609 Other forms of dyspnea: Secondary | ICD-10-CM | POA: Diagnosis not present

## 2021-12-09 NOTE — Patient Instructions (Signed)
Medication Instructions:   Your physician recommends that you continue on your current medications as directed. Please refer to the Current Medication list given to you today.  *If you need a refill on your cardiac medications before your next appointment, please call your pharmacy*    Follow-Up: At [facility], you and your health needs are our priority.  As part of our continuing mission to provide you with exceptional heart care, we have created designated Provider Care Teams.  These Care Teams include your primary Cardiologist (physician) and Advanced Practice Providers (APPs -  Physician Assistants and Nurse Practitioners) who all work together to provide you with the care you need, when you need it.  We recommend signing up for the patient portal called "MyChart".  Sign up information is provided on this After Visit Summary.  MyChart is used to connect with patients for Virtual Visits (Telemedicine).  Patients are able to view lab/test results, encounter notes, upcoming appointments, etc.  Non-urgent messages can be sent to your provider as well.   To learn more about what you can do with MyChart, go to [email or web address].    Your next appointment:   Follow up as needed   The format for your next appointment:   In Person  Provider:   Kate Sable, MD     Important Information About Sugar

## 2021-12-09 NOTE — Progress Notes (Signed)
Cardiology Office Note:    Date:  12/09/2021   ID:  Olivia King, DOB 10-05-1985, MRN [id number]  PCP:  Steele Sizer, MD  Cardiologist:  Kate Sable, MD  Electrophysiologist:  None   Referring MD: Steele Sizer, MD   Chief Complaint  Patient presents with   Other    OD 6 month f/u no compaint. Meds reviewed verbally with pt.    History of Present Illness:    Olivia King is a 36 y.o. female with a hx of  morbid obesity, depression, smoker x 39yr,who presents for follow-up.   Previously seen for dyspnea on exertion, deemed secondary to deconditioning/morbid obesity.  Graduated exercising advised.  Heart rate noted to be elevated, metoprolol was recommended.  Patient begun exercising with improvement in heart rates, did not take metoprolol.  States being diagnosed for increased pressure in her brain/CNS.  Underwent a spinal tap, started on topiramate which caused diuresing and improvements in her edema.  Stop torsemide as previously prescribed.  Overall states feeling better, with exercising and improvements in heart rates.  Prior notes Echo 54/1740normal systolic and diastolic function, EF 55 to 60%   Past Medical History:  Diagnosis Date   Cigarette smoker    COVID-19 virus detected 03/21/2019   Family history of pancreatic cancer 08/2020   MyRisk neg except SDHA VUS; IBIS=9.2%/riskscore=7.3%   IUD contraception 02/06/2016   Placed October 5, 28144by AArdeth Perfect PA [facility]   Major depressive disorder, recurrent ([place] 08/19/2015   zoloft made her cry; cymbalta made her too sleepy   Migraine headache     Past Surgical History:  Procedure Laterality Date   CESAREAN SECTION  2004 &2012   contraceptive implant      Current Medications: Current Meds  Medication Sig   clindamycin (CLEOCIN T) 1 % lotion Apply topically 2 (two) times daily.   Cyanocobalamin (B-12) 1000 MCG SUBL Place 1 tablet under the tongue daily at 12 noon.   EPINEPHrine 0.3  mg/0.3 mL IJ SOAJ injection Inject into the muscle.   hydrOXYzine (VISTARIL) 25 MG capsule SMARTSIG:1-2 Capsule(s) By Mouth Every 6-8 Hours PRN   ipratropium (ATROVENT) 0.03 % nasal spray ipratropium bromide 21 mcg (0.03 %) nasal spray   lamoTRIgine (LAMICTAL) 25 MG tablet TAKE 1 TABLET(25 MG) BY MOUTH DAILY   metroNIDAZOLE (FLAGYL) 500 MG tablet Take 500 mg by mouth 2 (two) times daily.   Multiple Vitamin (MULTI-VITAMINS) TABS Take by mouth.   topiramate (TOPAMAX) 50 MG tablet Take 150 mg by mouth daily.   traZODone (DESYREL) 50 MG tablet Take 0.5-1 tablets (25-50 mg total) by mouth at bedtime as needed for sleep.   venlafaxine XR (EFFEXOR-XR) 75 MG 24 hr capsule TAKE 1 CAPSULE(75 MG) BY MOUTH DAILY WITH BREAKFAST   Vitamin D, Ergocalciferol, (DRISDOL) 1.25 MG (50000 UNIT) CAPS capsule Take 50,000 Units by mouth once a week.   [DISCONTINUED] fluticasone (FLONASE) 50 MCG/ACT nasal spray Place 2 sprays into both nostrils daily.     Allergies:   Sumatriptan, Penicillins, Vortioxetine, and Oxybate   Social History   Socioeconomic History   Marital status: Widowed    Spouse name: Not on file   Number of children: 2   Years of education: Not on file   Highest education level: Associate degree: occupational, tHotel manager or vocational program  Occupational History   Not on file  Tobacco Use   Smoking status: Every Day    Years: 9.00    Types: Cigarettes    Start date:  03/05/2011   Smokeless tobacco: Never   Tobacco comments:    a pack last 2 days now - reported 05/10/2020  Vaping Use   Vaping Use: Some days   Substances: Flavoring  Substance and Sexual Activity   Alcohol use: Yes    Alcohol/week: 0.0 standard drinks of alcohol   Drug use: No   Sexual activity: Yes    Partners: Male    Birth control/protection: I.U.D.    Comment: Patient has liletta IUD  Other Topics Concern   Not on file  Social History Narrative   She is a widow, he died 12-Jul-2009 while he was deployed.    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   Social Determinants of Health   Financial Resource Strain: Low Risk  (09/15/2019)   Overall Financial Resource Strain (CARDIA)    Difficulty of Paying Living Expenses: Not hard at all  Food Insecurity: No Food Insecurity (05/10/2019)   Hunger Vital Sign    Worried About Running Out of Food in the Last Year: Never true    Ran Out of Food in the Last Year: Never true  Transportation Needs: No Transportation Needs (05/10/2019)   PRAPARE - Hydrologist (Medical): No    Lack of Transportation (Non-Medical): No  Physical Activity: Sufficiently Active (09/15/2019)   Exercise Vital Sign    Days of Exercise per Week: 3 days    Minutes of Exercise per Session: 50 min  Stress: No Stress Concern Present (09/15/2019)   [place]    Feeling of Stress : Not at all  Social Connections: Moderately Integrated (09/15/2019)   Social Connection and Isolation Panel [NHANES]    Frequency of Communication with Friends and Family: More than three times a week    Frequency of Social Gatherings with Friends and Family: More than three times a week    Attends Religious Services: More than 4 times per year    Active Member of [employer] or Organizations: Yes    Attends Archivist Meetings: More than 4 times per year    Marital Status: Widowed     Family History: The patient's family history includes Aneurysm in her maternal grandmother; Heart attack in her maternal grandfather; Heart failure in her sister; Hypertension in her brother, mother, and paternal grandmother; Pancreatic cancer (age of onset: 44) in her mother.  ROS:   Please see the history of present illness.     All other systems reviewed and are negative.  EKGs/Labs/Other Studies Reviewed:    The following studies were reviewed today:   EKG:  EKG is  ordered today.  EKG shows sinus rhythm, heart rate  99  Recent Labs: 12/17/2020: TSH 2.49 09/07/2021: ALT 16; BUN 11; Creatinine, Ser 1.06; Hemoglobin 12.8; Platelets 396; Potassium 3.8; Sodium 139  Recent Lipid Panel    Component Value Date/Time   CHOL 179 12/17/2020 0930   CHOL 176 12/21/2014 1136   TRIG 69 12/17/2020 0930   HDL 47 (L) 12/17/2020 0930   HDL 47 12/21/2014 1136   CHOLHDL 3.8 12/17/2020 0930   VLDL 11 03/19/2016 0947   LDLCALC 116 (H) 12/17/2020 0930    Physical Exam:    VS:  BP 110/78 (BP Location: Left Arm, Patient Position: Sitting, Cuff Size: Large)   Pulse 99   Ht '5\' 3"'$  (1.6 m)   Wt 229 lb 6 oz (104 kg)   SpO2 98%   BMI 40.63 kg/m  Wt Readings from Last 3 Encounters:  12/09/21 229 lb 6 oz (104 kg)  09/07/21 215 lb (97.5 kg)  09/05/21 215 lb (97.5 kg)     GEN:  Well nourished, well developed in no acute distress HEENT: Normal NECK: No JVD; No carotid bruits CARDIAC: RRR, no murmurs, rubs, gallops RESPIRATORY:  Clear to auscultation without rales, wheezing or rhonchi  ABDOMEN: Soft, non-tender, non-distended MUSCULOSKELETAL:  No edema; No deformity  SKIN: Warm and dry NEUROLOGIC:  Alert and oriented x 3 PSYCHIATRIC:  Normal affect   ASSESSMENT:    1. DOE (dyspnea on exertion)   2. Morbid obesity ([place])    PLAN:    In order of problems listed above:  dyspnea on exertion secondary to obesity, deconditioning.  Echo with normal EF.  Symptoms overall improved with exercise regimen.  Continue graduated exercising.  Morbid obesity, low-calorie diet, exercise, weight loss advised.  Follow-up as needed  This note was generated in part or whole with voice recognition software. Voice recognition is usually quite accurate but there are transcription errors that can and very often do occur. I apologize for any typographical errors that were not detected and corrected.  Medication Adjustments/Labs and Tests Ordered: Current medicines are reviewed at length with the patient today.  Concerns regarding  medicines are outlined above.  Orders Placed This Encounter  Procedures   EKG 12-Lead    No orders of the defined types were placed in this encounter.    Patient Instructions  Medication Instructions:   Your physician recommends that you continue on your current medications as directed. Please refer to the Current Medication list given to you today.  *If you need a refill on your cardiac medications before your next appointment, please call your pharmacy*    Follow-Up: At [facility], you and your health needs are our priority.  As part of our continuing mission to provide you with exceptional heart care, we have created designated Provider Care Teams.  These Care Teams include your primary Cardiologist (physician) and Advanced Practice Providers (APPs -  Physician Assistants and Nurse Practitioners) who all work together to provide you with the care you need, when you need it.  We recommend signing up for the patient portal called "MyChart".  Sign up information is provided on this After Visit Summary.  MyChart is used to connect with patients for Virtual Visits (Telemedicine).  Patients are able to view lab/test results, encounter notes, upcoming appointments, etc.  Non-urgent messages can be sent to your provider as well.   To learn more about what you can do with MyChart, go to [email or web address].    Your next appointment:   Follow up as needed   The format for your next appointment:   In Person  Provider:   Kate Sable, MD     Important Information About Sugar         Signed, Kate Sable, MD  12/09/2021 9:39 AM    [place]

## 2021-12-12 NOTE — Progress Notes (Unsigned)
Name: Olivia King   MRN: [id number]    DOB: 06/30/1985   Date:12/15/2021       Progress Note  Subjective  Chief Complaint  Follow Up  HPI  Lymphedema: she went to vascular surgeon, and was advised to use compression stocking hoses and because of sob advised to see cardiologist. She saw Cardiologist - Dr Garen Lah , she was taking demadex but states no longer having problems with swelling since she started on Topamax. She states occasionally has problems when she drives for a prolonged period of time   Tachycardia: Dr. Mylo Red gave her metoprolol but she never started, she is doing well now and released from his care    Morbid obesity: she states as a teenager she was in the 160 lbs range, but after her son was born in 2004 her weight stayed between 189-203 lbs , had her daughter in 2012 her weight went up to 270 lbs, but after her birth she was able to go back down to 203 lbs. She states in 2016 she took some otc diet pills and was going to the gym and her weight dropped about 40 lbs in a few months, she was back to 165 lbs. She told me back in April 2021 that  she can binge eat. She is able to avoid eating during the day - because she is so busy, but at night when upset with her children or not feeling well she eats to get comfort She states she feels out of control and is unable to stop eating, about twice a week, she feels very guilty afterwards. She states when she overeats she takes magnesium to help her have a bowel movement. She does not make herself vomit We started her on Vyvanse 07/2019 she has noticed that her mood improved and also decrease her urge to eat as much, her weight went up to 243 lbs in August after she had been out of Vyvanse for about 6 months. . She states no longer on phentermine ( got it from weight loss clinic) . Her weight has been stable.at 226 lbs for the past 6 months but unable to lose more on her own. She was seen by neurologist and advised to lose weight due to  idiopathic intracranial hypertension. She also has BMI over 40 and pre-diabetes. We will try [facility] for [facility] since she must lose weight to improve her outcome   MDD/GAD/eating disorder: she is under the care of Dr. Cherlyn Cushing but not currently seeing a therapist.  She is compliant with medication, no longer on Vyvanse due to cost , but taking Effexor and Lamictal and  hydroxizine prn  Migraine headache:She is allergic to Imitrex, takes Excedrin migraine or Fioricet. She states migraine is described  As unilateral, right or left side, however this episode is a little different , not associated with aura, and pain is behind her right eye not above. She is seeing neurologist and is now on Topamax 50 mg daily . She states no recent episodes of migraine Unchanged   Hydradenitis suppurative: she is now on Metformin and clindamycin lotion and seems to help some. She is seeing Dr. Milford Cage at [facility] , unchanged   Idiopathic intracranial Hypertension: diagnosed by neurologist 02/23 due to pulsating tinnitus and abnormal MRI brain, she has a lumbar puncture and tinnitus right ear resolved when she was having the leakage from the spinal tap but is back again, must lose weight. She also has some vision problems on both eyes  Low vitamin B12: level of 251 on 05/19/21 done by neurologist . She is taking toc supplementation but not SL, we will give her a b12 injection today and advised to start SL afterwards   Patient Active Problem List   Diagnosis Date Noted   Insomnia 10/23/2021   Hidradenitis suppurativa 10/30/2020   Family history of pancreatic cancer 08/16/2020   BV (bacterial vaginosis) 08/16/2020   MDD (major depressive disorder), recurrent, in full remission ([place]) 07/15/2020   MDD (major depressive disorder), recurrent episode, moderate ([place]) 02/13/2020   Tobacco use disorder 02/13/2020   MDD (major depressive disorder), recurrent episode, mild ([place]) 10/10/2019   GAD (generalized anxiety  disorder) 10/10/2019   Eating disorder 10/10/2019   Lymphedema 04/10/2019   Chronic venous insufficiency 04/10/2019   Leg pain 04/10/2019   Bilateral carpal tunnel syndrome 11/04/2018   Morbid obesity ([place]) 06/14/2017   Anxiety 03/24/2017   Contraception management 01/06/2016   Glucosuria 09/17/2015   Major depressive disorder, recurrent ([place]) 08/19/2015   Benign neoplasm of skin of nose 07/28/2015   Low serum vitamin D 12/24/2014   Migraine without aura 12/21/2014    Past Surgical History:  Procedure Laterality Date   CESAREAN SECTION  2004 &2012   contraceptive implant      Family History  Problem Relation Age of Onset   Hypertension Mother    Pancreatic cancer Mother 38   Hypertension Brother    Aneurysm Maternal Grandmother    Heart attack Maternal Grandfather    Hypertension Paternal Grandmother    Heart failure Sister     Social History   Tobacco Use   Smoking status: Every Day    Years: 9.00    Types: Cigarettes    Start date: 03/05/2011   Smokeless tobacco: Never   Tobacco comments:    a pack last 2 days now - reported 05/10/2020  Substance Use Topics   Alcohol use: Yes    Alcohol/week: 0.0 standard drinks of alcohol     Current Outpatient Medications:    clindamycin (CLEOCIN T) 1 % lotion, Apply topically 2 (two) times daily., Disp: 60 mL, Rfl: 2   Cyanocobalamin (B-12) 1000 MCG SUBL, Place 1 tablet under the tongue daily at 12 noon., Disp: , Rfl:    EPINEPHrine 0.3 mg/0.3 mL IJ SOAJ injection, Inject into the muscle., Disp: , Rfl:    hydrOXYzine (VISTARIL) 25 MG capsule, SMARTSIG:1-2 Capsule(s) By Mouth Every 6-8 Hours PRN, Disp: , Rfl:    ipratropium (ATROVENT) 0.03 % nasal spray, ipratropium bromide 21 mcg (0.03 %) nasal spray, Disp: , Rfl:    lamoTRIgine (LAMICTAL) 25 MG tablet, TAKE 1 TABLET(25 MG) BY MOUTH DAILY, Disp: 90 tablet, Rfl: 0   metroNIDAZOLE (FLAGYL) 500 MG tablet, Take 500 mg by mouth 2 (two) times daily., Disp: , Rfl:    Multiple  Vitamin (MULTI-VITAMINS) TABS, Take by mouth., Disp: , Rfl:    topiramate (TOPAMAX) 50 MG tablet, Take 150 mg by mouth daily., Disp: , Rfl:    traZODone (DESYREL) 50 MG tablet, Take 0.5-1 tablets (25-50 mg total) by mouth at bedtime as needed for sleep., Disp: 30 tablet, Rfl: 1   venlafaxine XR (EFFEXOR-XR) 75 MG 24 hr capsule, TAKE 1 CAPSULE(75 MG) BY MOUTH DAILY WITH BREAKFAST, Disp: 90 capsule, Rfl: 0   Vitamin D, Ergocalciferol, (DRISDOL) 1.25 MG (50000 UNIT) CAPS capsule, Take 50,000 Units by mouth once a week., Disp: , Rfl:   Allergies  Allergen Reactions   Sumatriptan Anaphylaxis    Throat and tongue  swelling  Throat and tongue swelling  Throat and tongue swelling    Penicillins Rash   Vortioxetine Other (See Comments)    Tingling hands and blurred vision  Tingling hands and blurred vision  Tingling hands and blurred vision    Oxybate     I personally reviewed active problem list, medication list, allergies, family history, social history, health maintenance with the patient/caregiver today.   ROS  Constitutional: Negative for fever or weight change.  Respiratory: Negative for cough and shortness of breath.   Cardiovascular: Negative for chest pain or palpitations.  Gastrointestinal: Negative for abdominal pain, no bowel changes.  Musculoskeletal: Negative for gait problem or joint swelling.  Skin: Negative for rash.  Neurological: Negative for dizziness or headache.  No other specific complaints in a complete review of systems (except as listed in HPI above).   Objective  Vitals:   12/15/21 0924  BP: 128/70  Pulse: 98  Resp: 16  SpO2: 97%  Weight: 226 lb (102.5 kg)  Height: '5\' 3"'$  (1.6 m)    Body mass index is 40.03 kg/m.  Physical Exam  Constitutional: Patient appears well-developed and well-nourished. Obese  No distress.  HEENT: head atraumatic, normocephalic, pupils equal and reactive to light,, neck supple Cardiovascular: Normal rate, regular rhythm and  normal heart sounds.  No murmur heard. No BLE edema. Pulmonary/Chest: Effort normal and breath sounds normal. No respiratory distress. Abdominal: Soft.  There is no tenderness. Psychiatric: Patient has a normal mood and affect. behavior is normal. Judgment and thought content normal.   PHQ2/9:    12/15/2021    9:23 AM 11/27/2021    5:01 PM 10/23/2021   11:10 AM 06/17/2021    8:02 AM 06/10/2021    9:01 AM  Depression screen PHQ 2/9  Decreased Interest 0   0   Down, Depressed, Hopeless 0   0   PHQ - 2 Score 0   0   Altered sleeping 0   0   Tired, decreased energy 0   0   Change in appetite 0   0   Feeling bad or failure about yourself  0   0   Trouble concentrating 0   0   Moving slowly or fidgety/restless 0   0   Suicidal thoughts 0   0   PHQ-9 Score 0   0   Difficult doing work/chores    Not difficult at all      Information is confidential and restricted. Go to Review Flowsheets to unlock data.    phq 9 is negative   Fall Risk:    12/15/2021    9:23 AM 06/17/2021    8:02 AM 12/17/2020    8:31 AM 10/30/2020    9:09 AM 06/17/2020    9:46 AM  Fall Risk   Falls in the past year? 0 1 0 0 0  Number falls in past yr: 0 0 0 0 0  Injury with Fall? 0 1 0 0 0  Risk for fall due to : No Fall Risks Impaired balance/gait No Fall Risks    Follow up Falls prevention discussed Falls prevention discussed Falls prevention discussed        Functional Status Survey: Is the patient deaf or have difficulty hearing?: Yes Does the patient have difficulty seeing, even when wearing glasses/contacts?: Yes Does the patient have difficulty concentrating, remembering, or making decisions?: No Does the patient have difficulty walking or climbing stairs?: No Does the patient have difficulty dressing or bathing?: No  Does the patient have difficulty doing errands alone such as visiting a doctor's office or shopping?: No    Assessment & Plan  1. Idiopathic intracranial hypertension  -  Semaglutide-Weight Management (WEGOVY) 0.25 MG/0.5ML SOAJ; Inject 0.25 mg into the skin once a week.  Dispense: 2 mL; Refill: 0  2. Need for immunization against influenza  - Flu Vaccine QUAD 6+ mos PF IM (Fluarix Quad PF)  3. Morbid obesity ([place])  - Semaglutide-Weight Management (WEGOVY) 0.25 MG/0.5ML SOAJ; Inject 0.25 mg into the skin once a week.  Dispense: 2 mL; Refill: 0  4. Binge eating disorder   5. Migraine without aura and without status migrainosus, not intractable   6. Depression, major, recurrent, in remission ([place])   7. Tinnitus of right ear   8. B12 deficiency  - cyanocobalamin (VITAMIN B12) injection 1,000 mcg - Cyanocobalamin (B-12) 1000 MCG SUBL; Place 1 tablet under the tongue daily at 12 noon.  Dispense: 100 tablet; Refill: 1 - B12 and Folate Panel  9. Lymphedema   10. Dyslipidemia  - Semaglutide-Weight Management (WEGOVY) 0.25 MG/0.5ML SOAJ; Inject 0.25 mg into the skin once a week.  Dispense: 2 mL; Refill: 0  11. GAD (generalized anxiety disorder)   12. Hidradenitis suppurativa   13. Acute vaginitis  - Cervicovaginal ancillary only  14. Routine screening for STI (sexually transmitted infection)  - Cervicovaginal ancillary only - HIV antibody (with reflex) - RPR - Lipid panel  15. Insulin resistance  - Hemoglobin A1c - Semaglutide-Weight Management (WEGOVY) 0.25 MG/0.5ML SOAJ; Inject 0.25 mg into the skin once a week.  Dispense: 2 mL; Refill: 0  16. Long-term use of high-risk medication  - COMPLETE METABOLIC PANEL WITH GFR - CBC with Differential/Platelet

## 2021-12-15 ENCOUNTER — Other Ambulatory Visit (HOSPITAL_COMMUNITY)
Admission: RE | Admit: 2021-12-15 | Discharge: 2021-12-15 | Disposition: A | Discharge status: Home / Self Care | Source: Ambulatory Visit | Attending: Family Medicine | Admitting: Family Medicine

## 2021-12-15 ENCOUNTER — Encounter: Payer: Self-pay | Admitting: Family Medicine

## 2021-12-15 ENCOUNTER — Ambulatory Visit (INDEPENDENT_AMBULATORY_CARE_PROVIDER_SITE_OTHER): Admitting: Family Medicine

## 2021-12-15 VITALS — BP 128/70 | HR 98 | Resp 16 | Ht 63.0 in | Wt 226.0 lb

## 2021-12-15 DIAGNOSIS — H9311 Tinnitus, right ear: Secondary | ICD-10-CM

## 2021-12-15 DIAGNOSIS — E88819 Insulin resistance, unspecified: Secondary | ICD-10-CM

## 2021-12-15 DIAGNOSIS — Z23 Encounter for immunization: Secondary | ICD-10-CM

## 2021-12-15 DIAGNOSIS — F5081 Binge eating disorder: Secondary | ICD-10-CM | POA: Diagnosis not present

## 2021-12-15 DIAGNOSIS — Z113 Encounter for screening for infections with a predominantly sexual mode of transmission: Secondary | ICD-10-CM | POA: Diagnosis present

## 2021-12-15 DIAGNOSIS — G43009 Migraine without aura, not intractable, without status migrainosus: Secondary | ICD-10-CM

## 2021-12-15 DIAGNOSIS — F50819 Binge eating disorder, unspecified: Secondary | ICD-10-CM

## 2021-12-15 DIAGNOSIS — F3340 Major depressive disorder, recurrent, in remission, unspecified: Secondary | ICD-10-CM

## 2021-12-15 DIAGNOSIS — F411 Generalized anxiety disorder: Secondary | ICD-10-CM

## 2021-12-15 DIAGNOSIS — N760 Acute vaginitis: Secondary | ICD-10-CM

## 2021-12-15 DIAGNOSIS — L732 Hidradenitis suppurativa: Secondary | ICD-10-CM

## 2021-12-15 DIAGNOSIS — G932 Benign intracranial hypertension: Secondary | ICD-10-CM | POA: Diagnosis not present

## 2021-12-15 DIAGNOSIS — Z79899 Other long term (current) drug therapy: Secondary | ICD-10-CM

## 2021-12-15 DIAGNOSIS — E538 Deficiency of other specified B group vitamins: Secondary | ICD-10-CM

## 2021-12-15 DIAGNOSIS — E8881 Metabolic syndrome: Secondary | ICD-10-CM

## 2021-12-15 DIAGNOSIS — I890 Lymphedema, not elsewhere classified: Secondary | ICD-10-CM

## 2021-12-15 DIAGNOSIS — E785 Hyperlipidemia, unspecified: Secondary | ICD-10-CM

## 2021-12-15 MED ORDER — WEGOVY 0.25 MG/0.5ML ~~LOC~~ SOAJ: 0.2500 mg | SUBCUTANEOUS | 0 refills | Status: DC

## 2021-12-15 MED ORDER — B-12 1000 MCG SL SUBL: 1.0000 | SUBLINGUAL_TABLET | Freq: Every day | SUBLINGUAL | 1 refills | Status: DC

## 2021-12-15 MED ORDER — CYANOCOBALAMIN 1000 MCG/ML IJ SOLN: 1000.0000 ug | Freq: Once | INTRAMUSCULAR | Status: DC

## 2021-12-16 LAB — CBC WITH DIFFERENTIAL/PLATELET
Absolute Monocytes: 570 cells/uL (ref 200–950)
Basophils Absolute: 9 cells/uL (ref 0–200)
Basophils Relative: 0.1 %
Eosinophils Absolute: 9 cells/uL — ABNORMAL LOW (ref 15–500)
Eosinophils Relative: 0.1 %
HCT: 37.6 % (ref 35.0–45.0)
Hemoglobin: 12.5 g/dL (ref 11.7–15.5)
Lymphs Abs: 2374 cells/uL (ref 850–3900)
MCH: 30.2 pg (ref 27.0–33.0)
MCHC: 33.2 g/dL (ref 32.0–36.0)
MCV: 90.8 fL (ref 80.0–100.0)
MPV: 9.5 fL (ref 7.5–12.5)
Monocytes Relative: 6.2 %
Neutro Abs: 6238 cells/uL (ref 1500–7800)
Neutrophils Relative %: 67.8 %
Platelets: 323 10*3/uL (ref 140–400)
RBC: 4.14 10*6/uL (ref 3.80–5.10)
RDW: 12.8 % (ref 11.0–15.0)
Total Lymphocyte: 25.8 %
WBC: 9.2 10*3/uL (ref 3.8–10.8)

## 2021-12-16 LAB — COMPLETE METABOLIC PANEL WITH GFR
AG Ratio: 1.5 (calc) (ref 1.0–2.5)
ALT: 12 U/L (ref 6–29)
AST: 14 U/L (ref 10–30)
Albumin: 3.8 g/dL (ref 3.6–5.1)
Alkaline phosphatase (APISO): 64 U/L (ref 31–125)
BUN/Creatinine Ratio: 9 (calc) (ref 6–22)
BUN: 9 mg/dL (ref 7–25)
CO2: 19 mmol/L — ABNORMAL LOW (ref 20–32)
Calcium: 9.1 mg/dL (ref 8.6–10.2)
Chloride: 111 mmol/L — ABNORMAL HIGH (ref 98–110)
Creat: 1.03 mg/dL — ABNORMAL HIGH (ref 0.50–0.97)
Globulin: 2.6 g/dL (calc) (ref 1.9–3.7)
Glucose, Bld: 95 mg/dL (ref 65–99)
Potassium: 4.1 mmol/L (ref 3.5–5.3)
Sodium: 138 mmol/L (ref 135–146)
Total Bilirubin: 0.6 mg/dL (ref 0.2–1.2)
Total Protein: 6.4 g/dL (ref 6.1–8.1)
eGFR: 72 mL/min/{1.73_m2} (ref >=60)

## 2021-12-16 LAB — CERVICOVAGINAL ANCILLARY ONLY
Bacterial Vaginitis (gardnerella): POSITIVE — AB
Candida Glabrata: NEGATIVE
Candida Vaginitis: NEGATIVE
Chlamydia: NEGATIVE
Comment: NEGATIVE
Comment: NEGATIVE
Comment: NEGATIVE
Comment: NEGATIVE
Comment: NEGATIVE
Comment: NORMAL
Neisseria Gonorrhea: NEGATIVE
Trichomonas: NEGATIVE

## 2021-12-16 LAB — HEMOGLOBIN A1C
Hgb A1c MFr Bld: 5 % of total Hgb (ref <5.7)
Mean Plasma Glucose: 97 mg/dL
eAG (mmol/L): 5.4 mmol/L

## 2021-12-16 LAB — LIPID PANEL
Cholesterol: 164 mg/dL (ref <200)
HDL: 45 mg/dL — ABNORMAL LOW (ref >=50)
LDL Cholesterol (Calc): 104 mg/dL (calc) — ABNORMAL HIGH
Non-HDL Cholesterol (Calc): 119 mg/dL (calc) (ref <130)
Total CHOL/HDL Ratio: 3.6 (calc) (ref <5.0)
Triglycerides: 67 mg/dL (ref <150)

## 2021-12-16 LAB — B12 AND FOLATE PANEL
Folate: 4.3 ng/mL — ABNORMAL LOW
Vitamin B-12: 442 pg/mL (ref 200–1100)

## 2021-12-16 LAB — RPR: RPR Ser Ql: NONREACTIVE

## 2021-12-16 LAB — HIV ANTIBODY (ROUTINE TESTING W REFLEX): HIV 1&2 Ab, 4th Generation: NONREACTIVE

## 2021-12-22 ENCOUNTER — Encounter: Payer: Self-pay | Admitting: Family Medicine

## 2021-12-23 ENCOUNTER — Other Ambulatory Visit: Payer: Self-pay | Admitting: Family Medicine

## 2021-12-23 MED ORDER — SOLOSEC 2 G PO PACK: 2.0000 g | PACK | Freq: Once | ORAL | 0 refills | Status: AC

## 2022-01-07 ENCOUNTER — Telehealth: Payer: Self-pay

## 2022-01-07 ENCOUNTER — Encounter: Payer: Self-pay | Admitting: Family Medicine

## 2022-01-07 DIAGNOSIS — F411 Generalized anxiety disorder: Secondary | ICD-10-CM

## 2022-01-07 DIAGNOSIS — F3342 Major depressive disorder, recurrent, in full remission: Secondary | ICD-10-CM

## 2022-01-07 NOTE — Telephone Encounter (Signed)
Pt needs refills on the lamotrigine last seen on 11-27-21 next appt  11-19.   Outpatient Medication Detail   Disp Refills Start End   lamoTRIgine (LAMICTAL) 25 MG tablet 90 tablet 0 10/08/2021    Sig: TAKE 1 TABLET(25 MG) BY MOUTH DAILY   Sent to pharmacy as: lamoTRIgine (LAMICTAL) 25 MG tablet   E-Prescribing Status: Receipt confirmed by pharmacy (10/08/2021  8:40 AM EDT)

## 2022-01-08 MED ORDER — LAMOTRIGINE 25 MG PO TABS: 25.0000 mg | ORAL_TABLET | Freq: Every day | ORAL | 0 refills | Status: DC

## 2022-01-08 MED ORDER — VENLAFAXINE HCL ER 75 MG PO CP24: ORAL_CAPSULE | ORAL | 0 refills | Status: DC

## 2022-01-08 NOTE — Telephone Encounter (Signed)
I have sent her Lamictal and venlafaxine to pharmacy.

## 2022-02-03 ENCOUNTER — Encounter: Payer: Self-pay | Admitting: Family Medicine

## 2022-02-05 ENCOUNTER — Encounter: Payer: Self-pay | Admitting: Psychiatry

## 2022-02-05 ENCOUNTER — Telehealth (INDEPENDENT_AMBULATORY_CARE_PROVIDER_SITE_OTHER): Admitting: Psychiatry

## 2022-02-05 DIAGNOSIS — G4700 Insomnia, unspecified: Secondary | ICD-10-CM | POA: Diagnosis not present

## 2022-02-05 DIAGNOSIS — F411 Generalized anxiety disorder: Secondary | ICD-10-CM

## 2022-02-05 DIAGNOSIS — F331 Major depressive disorder, recurrent, moderate: Secondary | ICD-10-CM

## 2022-02-05 DIAGNOSIS — F17200 Nicotine dependence, unspecified, uncomplicated: Secondary | ICD-10-CM

## 2022-02-05 MED ORDER — VARENICLINE TARTRATE 0.5 MG PO TABS: 0.5000 mg | ORAL_TABLET | Freq: Two times a day (BID) | ORAL | 2 refills | Status: DC

## 2022-02-05 NOTE — Patient Instructions (Signed)
App for smoking cessation - STAYQUITCOACH  Varenicline Tablets What is this medication? VARENICLINE (var e NI kleen) helps you quit smoking. It reduces cravings for nicotine, the addictive substance found in tobacco. It is most effective when used in combination with a stop-smoking program. This medicine may be used for other purposes; ask your health care provider or pharmacist if you have questions. COMMON BRAND NAME(S): Chantix What should I tell my care team before I take this medication? They need to know if you have any of these conditions: Heart disease Frequently drink alcohol Kidney disease Mental health condition On hemodialysis Seizures History of stroke Suicidal thoughts, plans, or attempt by you or a family member An unusual or allergic reaction to varenicline, other medications, foods, dyes, or preservatives Pregnant or trying to get pregnant Breast-feeding How should I use this medication? Take this medication by mouth after eating. Take with a full glass of water. Follow the directions on the prescription label. Take your doses at regular intervals. Do not take your medication more often than directed. There are 3 ways you can use this medication to help you quit smoking; talk to your care team to decide which plan is right for you: 1) you can choose a quit date and start this medication 1 week before the quit date, or, 2) you can start taking this medication before you choose a quit date, and then pick a quit date between day 8 and 35 days of treatment, or, 3) if you are not sure that you are able or willing to quit smoking right away, start taking this medication and slowly decrease the amount you smoke as directed by your care team with the goal of being cigarette-free by week 12 of treatment. Stick to your plan; ask about support groups or other ways to help you remain cigarette-free. If you are motivated to quit smoking and did not succeed during a previous attempt with  this medication for reasons other than side effects, or if you returned to smoking after this treatment, speak with your care team about whether another course of this medication may be right for you. A special MedGuide will be given to you by the pharmacist with each prescription and refill. Be sure to read this information carefully each time. Talk to your care team about the use of this medication in children. This medication is not approved for use in children. Overdosage: If you think you have taken too much of this medicine contact a poison control center or emergency room at once. NOTE: This medicine is only for you. Do not share this medicine with others. What if I miss a dose? If you miss a dose, take it as soon as you can. If it is almost time for your next dose, take only that dose. Do not take double or extra doses. What may interact with this medication? Alcohol Insulin Other medications used to help people quit smoking Theophylline Warfarin This list may not describe all possible interactions. Give your health care provider a list of all the medicines, herbs, non-prescription drugs, or dietary supplements you use. Also tell them if you smoke, drink alcohol, or use illegal drugs. Some items may interact with your medicine. What should I watch for while using this medication? It is okay if you do not succeed at your attempt to quit and have a cigarette. You can still continue your quit attempt and keep using this medication as directed. Just throw away your cigarettes and get back to your  quit plan. Talk to your care team before using other treatments to quit smoking. Using this medication with other treatments to quit smoking may increase the risk for side effects compared to using a treatment alone. This medication may affect your coordination, reaction time, or judgment. Do not drive or operate machinery until you know how this medication affects you. Sit up or stand slowly to reduce  the risk of dizzy or fainting spells. Decrease the number of alcoholic beverages that you drink during treatment with this medication until you know if this medication affects your ability to tolerate alcohol. Some people have experienced increased drunkenness (intoxication), unusual or sometimes aggressive behavior, or no memory of things that have happened (amnesia) during treatment with this medication. You may do unusual sleep behaviors or activities you do not remember the day after taking this medication. Activities include driving, making or eating food, talking on the phone, sexual activity, or sleep walking. Stop taking this medication and call your care team right away if you find out you have done activities like this. Patients and their families should watch out for new or worsening depression or thoughts of suicide. Also watch out for sudden changes in feelings such as feeling anxious, agitated, panicky, irritable, hostile, aggressive, impulsive, severely restless, overly excited and hyperactive, or not being able to sleep. If this happens, call your care team. If you have diabetes, and you quit smoking, the effects of insulin may be increased. You may need to reduce your insulin dose. Check with your care team about how you should adjust your insulin dose. What side effects may I notice from receiving this medication? Side effects that you should report to your care team as soon as possible: Allergic reactions or angioedema--skin rash, itching or hives, swelling of the face, eyes, lips, tongue, arms, or legs, trouble swallowing or breathing Heart attack--pain or tightness in the chest, shoulders, arms, or jaw, nausea, shortness of breath, cold or clammy skin, feeling faint or lightheaded Mood and behavior changes--anxiety, nervousness, confusion, hallucinations, irritability, hostility, thoughts of suicide or self-harm, worsening mood, feelings of depression Redness, blistering, peeling, or  loosening of the skin, including inside the mouth Stroke--sudden numbness or weakness of the face, arm, or leg, trouble speaking, confusion, trouble walking, loss of balance or coordination, dizziness, severe headache, change in vision Seizures Side effects that usually do not require medical attention (report to your care team if they continue or are bothersome): Constipation Drowsiness Gas Nausea Trouble sleeping Upset stomach Vivid dreams or nightmares Vomiting This list may not describe all possible side effects. Call your doctor for medical advice about side effects. You may report side effects to FDA at 1-800-FDA-1088. Where should I keep my medication? Keep out of the reach of children and pets. Store at room temperature between 15 and 30 degrees C (59 and 86 degrees F). Throw away any unused medication after the expiration date. NOTE: This sheet is a summary. It may not cover all possible information. If you have questions about this medicine, talk to your doctor, pharmacist, or health care provider.  2023 Elsevier/Gold Standard (2021-02-21 00:00:00)

## 2022-02-05 NOTE — Progress Notes (Signed)
Virtual Visit via Video Note  I connected with Olivia King on 02/05/22 at  1:00 PM EDT by a video enabled telemedicine application and verified that I am speaking with the correct person using two identifiers.  Location Provider Location : [facility] Patient Location : Car  Participants: Patient , Provider    I discussed the limitations of evaluation and management by telemedicine and the availability of in person appointments. The patient expressed understanding and agreed to proceed.   I discussed the assessment and treatment plan with the patient. The patient was provided an opportunity to ask questions and all were answered. The patient agreed with the plan and demonstrated an understanding of the instructions.   The patient was advised to call back or seek an in-person evaluation if the symptoms worsen or if the condition fails to improve as anticipated.    [place] MD OP Progress Note  02/05/2022 2:25 PM Olivia King  MRN:  [id number]  Chief Complaint:  Chief Complaint  Patient presents with   Follow-up   Medication Refill   Depression   Anxiety   HPI: Olivia King is a 36 year old female, currently unemployed, lives in [place], widowed, has a history of MDD, GAD, tobacco use disorder was evaluated by telemedicine today.  Patient today reports she is currently planning to stay unemployed for the next 1 year.  She tried applying for several jobs however she did not get any positive response.  Patient reports she hence decided to take 1 year break from work.  She is financially secure to do so.  She reports her son who is in college in [place] also needs her support and she is planning to travel there tomorrow.  She has an 57 year old daughter who also needs a lot of support at home.  Patient reports overall mood symptoms are stable.  Denies any significant depression.  She does have anxiety about current situational stressors like her job loss, her son who moved away for  college and sown.  However she has been managing okay.  Currently compliant on the venlafaxine, Lamictal.  Denies side effects.  Denies any appetite changes.  Would like to lose weight.  Reports she was prescribed [facility] and is currently waiting for it to be filled.  Patient denies any suicidality, homicidality or perceptual disturbances.  Continues to smoke cigarettes, interested in smoking cessation.  Discussed Chantix, would like to try it.  Denies any other concerns today.  Visit Diagnosis:    ICD-10-CM   1. MDD (major depressive disorder), recurrent episode, moderate (HCC)  F33.1     2. GAD (generalized anxiety disorder)  F41.1     3. Insomnia, unspecified type  G47.00     4. Tobacco use disorder  F17.200 varenicline (CHANTIX) 0.5 MG tablet      Past Psychiatric History: Reviewed past psychiatric history from progress note on 10/10/2019.  Past trials of medications like Lexapro, Vyvanse, Abilify, Paxil, Zoloft, Cymbalta.  Past Medical History:  Past Medical History:  Diagnosis Date   Cigarette smoker    COVID-19 virus detected 03/21/2019   Family history of pancreatic cancer 08/2020   MyRisk neg except SDHA VUS; IBIS=9.2%/riskscore=7.3%   IUD contraception 02/06/2016   Placed January 22, 997 by Ardeth Perfect, PA [facility]   Major depressive disorder, recurrent ([place]) 08/19/2015   zoloft made her cry; cymbalta made her too sleepy   Migraine headache     Past Surgical History:  Procedure Laterality Date   CESAREAN SECTION  2004 &2012  contraceptive implant      Family Psychiatric History: Reviewed family psychiatric history from progress note on 10/10/2019.  Family History:  Family History  Problem Relation Age of Onset   Hypertension Mother    Pancreatic cancer Mother 58   Hypertension Brother    Aneurysm Maternal Grandmother    Heart attack Maternal Grandfather    Hypertension Paternal Grandmother    Heart failure Sister     Social History: Reviewed  social history from progress note on 10/10/2019. Social History   Socioeconomic History   Marital status: Widowed    Spouse name: Not on file   Number of children: 2   Years of education: Not on file   Highest education level: Associate degree: occupational, Hotel manager, or vocational program  Occupational History   Not on file  Tobacco Use   Smoking status: Every Day    Years: 9.00    Types: Cigarettes    Start date: 03/05/2011   Smokeless tobacco: Never   Tobacco comments:    a pack last 2 days now - reported 05/10/2020  Vaping Use   Vaping Use: Some days   Substances: Flavoring  Substance and Sexual Activity   Alcohol use: Yes    Alcohol/week: 0.0 standard drinks of alcohol   Drug use: No   Sexual activity: Yes    Partners: Male    Birth control/protection: I.U.D.    Comment: Patient has liletta IUD  Other Topics Concern   Not on file  Social History Narrative   She is a widow, he died Jul 18, 2009 while he was deployed.    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   Social Determinants of Health   Financial Resource Strain: Low Risk  (09/15/2019)   Overall Financial Resource Strain (CARDIA)    Difficulty of Paying Living Expenses: Not hard at all  Food Insecurity: No Food Insecurity (05/10/2019)   Hunger Vital Sign    Worried About Running Out of Food in the Last Year: Never true    Ran Out of Food in the Last Year: Never true  Transportation Needs: No Transportation Needs (05/10/2019)   PRAPARE - Hydrologist (Medical): No    Lack of Transportation (Non-Medical): No  Physical Activity: Sufficiently Active (09/15/2019)   Exercise Vital Sign    Days of Exercise per Week: 3 days    Minutes of Exercise per Session: 50 min  Stress: No Stress Concern Present (09/15/2019)   [place]    Feeling of Stress : Not at all  Social Connections: Moderately Integrated (09/15/2019)   Social  Connection and Isolation Panel [NHANES]    Frequency of Communication with Friends and Family: More than three times a week    Frequency of Social Gatherings with Friends and Family: More than three times a week    Attends Religious Services: More than 4 times per year    Active Member of [employer] or Organizations: Yes    Attends Archivist Meetings: More than 4 times per year    Marital Status: Widowed    Allergies:  Allergies  Allergen Reactions   Sumatriptan Anaphylaxis    Throat and tongue swelling  Throat and tongue swelling  Throat and tongue swelling    Penicillins Rash   Vortioxetine Other (See Comments)    Tingling hands and blurred vision  Tingling hands and blurred vision  Tingling hands and blurred vision    Oxybate  Metabolic Disorder Labs: Lab Results  Component Value Date   HGBA1C 5.0 12/15/2021   MPG 97 12/15/2021   MPG 100 05/10/2019   No results found for: "PROLACTIN" Lab Results  Component Value Date   CHOL 164 12/15/2021   TRIG 67 12/15/2021   HDL 45 (L) 12/15/2021   CHOLHDL 3.6 12/15/2021   VLDL 11 03/19/2016   LDLCALC 104 (H) 12/15/2021   LDLCALC 116 (H) 12/17/2020   Lab Results  Component Value Date   TSH 2.49 12/17/2020   TSH 2.17 05/10/2019    Therapeutic Level Labs: No results found for: "LITHIUM" No results found for: "VALPROATE" No results found for: "CBMZ"  Current Medications: Current Outpatient Medications  Medication Sig Dispense Refill   cetirizine (ZYRTEC) 10 MG tablet Take 10 mg by mouth daily.     clindamycin (CLEOCIN T) 1 % lotion Apply topically 2 (two) times daily. 60 mL 2   hydrOXYzine (VISTARIL) 25 MG capsule SMARTSIG:1-2 Capsule(s) By Mouth Every 6-8 Hours PRN     ipratropium (ATROVENT) 0.03 % nasal spray ipratropium bromide 21 mcg (0.03 %) nasal spray     lamoTRIgine (LAMICTAL) 25 MG tablet Take 1 tablet (25 mg total) by mouth daily. 90 tablet 0   Multiple Vitamin (MULTI-VITAMINS) TABS Take by mouth.      traZODone (DESYREL) 50 MG tablet Take 0.5-1 tablets (25-50 mg total) by mouth at bedtime as needed for sleep. 30 tablet 1   varenicline (CHANTIX) 0.5 MG tablet Take 1 tablet (0.5 mg total) by mouth 2 (two) times daily. 60 tablet 2   venlafaxine XR (EFFEXOR-XR) 75 MG 24 hr capsule TAKE 1 CAPSULE(75 MG) BY MOUTH DAILY WITH BREAKFAST 90 capsule 0   Vitamin D, Ergocalciferol, (DRISDOL) 1.25 MG (50000 UNIT) CAPS capsule Take 50,000 Units by mouth once a week.     Cyanocobalamin (B-12) 1000 MCG SUBL Place 1 tablet under the tongue daily at 12 noon. (Patient not taking: Reported on 02/05/2022) 100 tablet 1   EPINEPHrine 0.3 mg/0.3 mL IJ SOAJ injection Inject into the muscle. (Patient not taking: Reported on 02/05/2022)     Semaglutide-Weight Management (WEGOVY) 0.25 MG/0.5ML SOAJ Inject 0.25 mg into the skin once a week. 2 mL 0   Current Facility-Administered Medications  Medication Dose Route Frequency Provider Last Rate Last Admin   cyanocobalamin (VITAMIN B12) injection 1,000 mcg  1,000 mcg Intramuscular Once Steele Sizer, MD         Musculoskeletal: Strength & Muscle Tone:  [place]:  Seated Patient leans: N/A  Psychiatric Specialty Exam: Review of Systems  Psychiatric/Behavioral:  The patient is nervous/anxious.   All other systems reviewed and are negative.   There were no vitals taken for this visit.There is no height or weight on file to calculate BMI.  General Appearance: Casual  Eye Contact:  Good  Speech:  Clear and Coherent  Volume:  Normal  Mood:  Anxious  Affect:  Congruent  Thought Process:  Goal Directed and Descriptions of Associations: Intact  Orientation:  Full (Time, Place, and Person)  Thought Content: Logical   Suicidal Thoughts:  No  Homicidal Thoughts:  No  Memory:  Immediate;   Fair Recent;   Fair Remote;   Fair  Judgement:  Fair  Insight:  Fair  Psychomotor Activity:  Normal  Concentration:  Concentration: Fair and Attention Span: Fair   Recall:  [employer] of Knowledge: Fair  Language: Fair  Akathisia:  No  Handed:  Right  AIMS (if indicated):  not done  Assets:  Communication Skills Desire for Improvement Housing Social Support  ADL's:  Intact  Cognition: WNL  Sleep:  Fair   Screenings: AIMS    Flowsheet Row Video Visit from 10/23/2021 in [place] Total Score 0      GAD-7    Flowsheet Row Video Visit from 02/05/2022 in [place] Video Visit from 11/27/2021 in [place] Video Visit from 10/23/2021 in [place] Video Visit from 11/06/2020 in [place] Video Visit from 09/25/2020 in [place]  Total GAD-7 Score 1 0 '13 14 13      '$ PHQ2-9    Flowsheet Row Video Visit from 02/05/2022 in [place] Office Visit from 12/15/2021 in [facility] Video Visit from 11/27/2021 in [place] Video Visit from 10/23/2021 in [place] Office Visit from 06/17/2021 in [facility]  PHQ-2 Total Score 0 0 0 0 0  PHQ-9 Total Score 0 0 0 -- 0      Flowsheet Row Video Visit from 02/05/2022 in [place] Video Visit from 11/27/2021 in [place] Video Visit from 10/23/2021 in [place] No Risk No Risk No Risk        Assessment and Plan: Olivia King is a 36 year old female, widowed, lives in [place], has a history of MDD, GAD was evaluated by telemedicine today.  Patient with recent job loss, other situational stressors as noted above, although anxious has been coping okay, currently stable on medications, will benefit from the following plan.  Plan MDD in remission Venlafaxine 75 mg p.o. daily Lamotrigine  25 mg p.o. daily  GAD-stable Venlafaxine 75 mg p.o. daily  Insomnia-stable Trazodone 25-50 mg p.o. nightly as needed Continue sleep hygiene techniques  Tobacco use disorder-unstable Provided counseling for 5 minutes. Provided information for [facility]. Start Chantix 0.5 mg p.o. twice daily.  Provided medication education.  Follow-up in clinic in 2 to 3 months or sooner in person.  Consent: Patient/Guardian gives verbal consent for treatment and assignment of benefits for services provided during this visit. Patient/Guardian expressed understanding and agreed to proceed.   This note was generated in part or whole with voice recognition software. Voice recognition is usually quite accurate but there are transcription errors that can and very often do occur. I apologize for any typographical errors that were not detected and corrected.      Olivia Alert, MD 02/05/2022, 2:25 PM

## 2022-02-10 ENCOUNTER — Encounter: Payer: Self-pay | Admitting: Family Medicine

## 2022-02-10 ENCOUNTER — Ambulatory Visit (INDEPENDENT_AMBULATORY_CARE_PROVIDER_SITE_OTHER): Admitting: Family Medicine

## 2022-02-10 VITALS — BP 124/76 | HR 109 | Temp 97.8°F | Resp 18 | Ht 63.0 in | Wt 223.4 lb

## 2022-02-10 DIAGNOSIS — H9311 Tinnitus, right ear: Secondary | ICD-10-CM | POA: Diagnosis not present

## 2022-02-10 DIAGNOSIS — B9689 Other specified bacterial agents as the cause of diseases classified elsewhere: Secondary | ICD-10-CM | POA: Diagnosis not present

## 2022-02-10 DIAGNOSIS — N760 Acute vaginitis: Secondary | ICD-10-CM | POA: Diagnosis not present

## 2022-02-10 MED ORDER — METRONIDAZOLE 1 % EX GEL: Freq: Every day | CUTANEOUS | 2 refills | Status: DC

## 2022-02-10 NOTE — Progress Notes (Signed)
Name: Olivia King   MRN: [id number]    DOB: 1985-08-29   Date:02/10/2022       Progress Note  Subjective  Chief Complaint  Vascular Referral  HPI  Tinnitus: she has a long history of tinnitus right side, she states it feels like a heart beat on her ear, she is very concerned about aneurysm, she scheduled a visit with vascular surgeon for Monday but explained we can order doppler US and she can see them afterwards if positive study. She agrees with recommendation  She denies scotomas, no hearing loss  BV: she  has recurrent BV's she has tried multiple medications, seen by OBGYN and used boric acid for 30 days but even that did not work. She think thinks it is secondary to IUD, but she is afraid of taking it out since she has fibroid tumors and menorrhagia. She would like to try metrogel again. She has not been sexually active   Patient Active Problem List   Diagnosis Date Noted   Insomnia 10/23/2021   Hidradenitis suppurativa 10/30/2020   Family history of pancreatic cancer 08/16/2020   MDD (major depressive disorder), recurrent episode, moderate ([place]) 02/13/2020   Tobacco use disorder 02/13/2020   GAD (generalized anxiety disorder) 10/10/2019   Eating disorder 10/10/2019   Lymphedema 04/10/2019   Chronic venous insufficiency 04/10/2019   Bilateral carpal tunnel syndrome 11/04/2018   Morbid obesity ([place]) 06/14/2017   Contraception management 01/06/2016   Depression, major, recurrent, in remission ([place]) 08/19/2015   Low serum vitamin D 12/24/2014   Migraine without aura 12/21/2014    Past Surgical History:  Procedure Laterality Date   CESAREAN SECTION  2004 &2012   contraceptive implant      Family History  Problem Relation Age of Onset   Hypertension Mother    Pancreatic cancer Mother 82   Hypertension Brother    Aneurysm Maternal Grandmother    Heart attack Maternal Grandfather    Hypertension Paternal Grandmother    Heart failure Sister     Social History    Tobacco Use   Smoking status: Every Day    Years: 9.00    Types: Cigarettes    Start date: 03/05/2011   Smokeless tobacco: Never   Tobacco comments:    a pack last 2 days now - reported 05/10/2020  Substance Use Topics   Alcohol use: Yes    Alcohol/week: 0.0 standard drinks of alcohol     Current Outpatient Medications:    cetirizine (ZYRTEC) 10 MG tablet, Take 10 mg by mouth daily., Disp: , Rfl:    clindamycin (CLEOCIN T) 1 % lotion, Apply topically 2 (two) times daily., Disp: 60 mL, Rfl: 2   hydrOXYzine (VISTARIL) 25 MG capsule, SMARTSIG:1-2 Capsule(s) By Mouth Every 6-8 Hours PRN, Disp: , Rfl:    ipratropium (ATROVENT) 0.03 % nasal spray, ipratropium bromide 21 mcg (0.03 %) nasal spray, Disp: , Rfl:    lamoTRIgine (LAMICTAL) 25 MG tablet, Take 1 tablet (25 mg total) by mouth daily., Disp: 90 tablet, Rfl: 0   Multiple Vitamin (MULTI-VITAMINS) TABS, Take by mouth., Disp: , Rfl:    traZODone (DESYREL) 50 MG tablet, Take 0.5-1 tablets (25-50 mg total) by mouth at bedtime as needed for sleep., Disp: 30 tablet, Rfl: 1   venlafaxine XR (EFFEXOR-XR) 75 MG 24 hr capsule, TAKE 1 CAPSULE(75 MG) BY MOUTH DAILY WITH BREAKFAST, Disp: 90 capsule, Rfl: 0   Vitamin D, Ergocalciferol, (DRISDOL) 1.25 MG (50000 UNIT) CAPS capsule, Take 50,000 Units by mouth once a  week., Disp: , Rfl:    EPINEPHrine 0.3 mg/0.3 mL IJ SOAJ injection, Inject into the muscle. (Patient not taking: Reported on 02/10/2022), Disp: , Rfl:    Semaglutide-Weight Management (WEGOVY) 0.25 MG/0.5ML SOAJ, Inject 0.25 mg into the skin once a week. (Patient not taking: Reported on 02/10/2022), Disp: 2 mL, Rfl: 0  Current Facility-Administered Medications:    cyanocobalamin (VITAMIN B12) injection 1,000 mcg, 1,000 mcg, Intramuscular, Once, Steele Sizer, MD  Allergies  Allergen Reactions   Sumatriptan Anaphylaxis    Throat and tongue swelling  Throat and tongue swelling  Throat and tongue swelling    Penicillins Rash    Vortioxetine Other (See Comments)    Tingling hands and blurred vision  Tingling hands and blurred vision  Tingling hands and blurred vision    Oxybate     I personally reviewed active problem list, medication list, allergies, family history, social history, health maintenance with the patient/caregiver today.   ROS  Ten systems reviewed and is negative except as mentioned in HPI   Objective  Vitals:   02/10/22 1414  BP: 124/76  Pulse: (!) 109  Resp: 18  Temp: 97.8 F (36.6 C)  TempSrc: Oral  SpO2: 98%  Weight: 223 lb 6.4 oz (101.3 kg)  Height: _0  (1.6 m)    Body mass index is 39.57 kg/m.  Physical Exam  Constitutional: Patient appears well-developed and well-nourished. Obese  No distress.  HEENT: head atraumatic, normocephalic, pupils equal and reactive to light, ears normal TM, neck supple, no bruit noticed on exam  Cardiovascular: Normal rate, regular rhythm and normal heart sounds.  No murmur heard. No BLE edema. Pulmonary/Chest: Effort normal and breath sounds normal. No respiratory distress. Abdominal: Soft.  There is no tenderness. Psychiatric: Patient has a normal mood and affect. behavior is normal. Judgment and thought content normal.   Recent Results (from the past 2160 hour(s))  Cervicovaginal ancillary only     Status: Abnormal   Collection Time: 12/15/21  9:50 AM  Result Value Ref Range   Neisseria Gonorrhea Negative    Chlamydia Negative    Trichomonas Negative    Bacterial Vaginitis (gardnerella) Positive (A)    Candida Vaginitis Negative    Candida Glabrata Negative    Comment      Normal Reference Range Bacterial Vaginosis - Negative   Comment Normal Reference Range Candida Species - Negative    Comment Normal Reference Range Candida Galbrata - Negative    Comment Normal Reference Range Trichomonas - Negative    Comment Normal Reference Ranger Chlamydia - Negative    Comment      Normal Reference Range Neisseria Gonorrhea - Negative  HIV  antibody (with reflex)     Status: None   Collection Time: 12/15/21 10:01 AM  Result Value Ref Range   HIV 1&2 Ab, 4th Generation NON-REACTIVE NON-REACTIVE    Comment: HIV-1 antigen and HIV-1/HIV-2 antibodies were not detected. There is no laboratory evidence of HIV infection. Marland Kitchen PLEASE NOTE: This information has been disclosed to you from records whose confidentiality may be protected by state law.  If your state requires such protection, then the state law prohibits you from making any further disclosure of the information without the specific written consent of the person to whom it pertains, or as otherwise permitted by law. A general authorization for the release of medical or other information is NOT sufficient for this purpose. . For additional information please refer to http://education.[facility].com/faq/FAQ106 (This link is being provided for informational/ educational  purposes only.) . Marland Kitchen The performance of this assay has not been clinically validated in patients less than 75 years old. .   RPR     Status: None   Collection Time: 12/15/21 10:01 AM  Result Value Ref Range   RPR Ser Ql NON-REACTIVE NON-REACTIVE  Lipid panel     Status: Abnormal   Collection Time: 12/15/21 10:01 AM  Result Value Ref Range   Cholesterol 164 <200 mg/dL   HDL 45 (L) > OR = 50 mg/dL   Triglycerides 67 <150 mg/dL   LDL Cholesterol (Calc) 104 (H) mg/dL (calc)    Comment: Reference range: <100 . Desirable range <100 mg/dL for primary prevention;   <70 mg/dL for patients with CHD or diabetic patients  with > or = 2 CHD risk factors. Marland Kitchen LDL-C is now calculated using the Martin-Hopkins  calculation, which is a validated novel method providing  better accuracy than the Friedewald equation in the  estimation of LDL-C.  Cresenciano Genre et al. Annamaria Helling. 7544;920(10): 2061-2068  (http://education.[facility].com/faq/FAQ164)    Total CHOL/HDL Ratio 3.6 <5.0 (calc)   Non-HDL Cholesterol  (Calc) 119 <130 mg/dL (calc)    Comment: For patients with diabetes plus 1 major ASCVD risk  factor, treating to a non-HDL-C goal of <100 mg/dL  (LDL-C of <70 mg/dL) is considered a therapeutic  option.   COMPLETE METABOLIC PANEL WITH GFR     Status: Abnormal   Collection Time: 12/15/21 10:01 AM  Result Value Ref Range   Glucose, Bld 95 65 - 99 mg/dL    Comment: .            Fasting reference interval .    BUN 9 7 - 25 mg/dL   Creat 1.03 (H) 0.50 - 0.97 mg/dL   eGFR 72 > OR = 60 mL/min/1.69m   BUN/Creatinine Ratio 9 6 - 22 (calc)   Sodium 138 135 - 146 mmol/L   Potassium 4.1 3.5 - 5.3 mmol/L   Chloride 111 (H) 98 - 110 mmol/L    Comment: Verified by repeat analysis. .    CO2 19 (L) 20 - 32 mmol/L   Calcium 9.1 8.6 - 10.2 mg/dL   Total Protein 6.4 6.1 - 8.1 g/dL   Albumin 3.8 3.6 - 5.1 g/dL   Globulin 2.6 1.9 - 3.7 g/dL (calc)   AG Ratio 1.5 1.0 - 2.5 (calc)   Total Bilirubin 0.6 0.2 - 1.2 mg/dL   Alkaline phosphatase (APISO) 64 31 - 125 U/L   AST 14 10 - 30 U/L   ALT 12 6 - 29 U/L  CBC with Differential/Platelet     Status: Abnormal   Collection Time: 12/15/21 10:01 AM  Result Value Ref Range   WBC 9.2 3.8 - 10.8 Thousand/uL   RBC 4.14 3.80 - 5.10 Million/uL   Hemoglobin 12.5 11.7 - 15.5 g/dL   HCT 37.6 35.0 - 45.0 %   MCV 90.8 80.0 - 100.0 fL   MCH 30.2 27.0 - 33.0 pg   MCHC 33.2 32.0 - 36.0 g/dL   RDW 12.8 11.0 - 15.0 %   Platelets 323 140 - 400 Thousand/uL   MPV 9.5 7.5 - 12.5 fL   Neutro Abs 6,238 1,500 - 7,800 cells/uL   Lymphs Abs 2,374 850 - 3,900 cells/uL   Absolute Monocytes 570 200 - 950 cells/uL   Eosinophils Absolute 9 (L) 15 - 500 cells/uL   Basophils Absolute 9 0 - 200 cells/uL   Neutrophils Relative % 67.8 %   Total  Lymphocyte 25.8 %   Monocytes Relative 6.2 %   Eosinophils Relative 0.1 %   Basophils Relative 0.1 %  Hemoglobin A1c     Status: None   Collection Time: 12/15/21 10:01 AM  Result Value Ref Range   Hgb A1c MFr Bld 5.0 <5.7 % of  total Hgb    Comment: For the purpose of screening for the presence of diabetes: . <5.7%       Consistent with the absence of diabetes 5.7-6.4%    Consistent with increased risk for diabetes             (prediabetes) > or =6.5%  Consistent with diabetes . This assay result is consistent with a decreased risk of diabetes. . Currently, no consensus exists regarding use of hemoglobin A1c for diagnosis of diabetes in children. . According to American Diabetes Association (ADA) guidelines, hemoglobin A1c <7.0% represents optimal control in non-pregnant diabetic patients. Different metrics may apply to specific patient populations.  Standards of Medical Care in Diabetes(ADA). .    Mean Plasma Glucose 97 mg/dL   eAG (mmol/L) 5.4 mmol/L  B12 and Folate Panel     Status: Abnormal   Collection Time: 12/15/21 10:01 AM  Result Value Ref Range   Vitamin B-12 442 200 - 1,100 pg/mL   Folate 4.3 (L) ng/mL    Comment:                            Reference Range                            Low:           <3.4                            Borderline:    3.4-5.4                            Normal:        >5.4 .      PHQ2/9:    02/10/2022    2:17 PM 02/05/2022    1:14 PM 12/15/2021    9:23 AM 11/27/2021    5:01 PM 10/23/2021   11:10 AM  Depression screen PHQ 2/9  Decreased Interest 0  0    Down, Depressed, Hopeless 0  0    PHQ - 2 Score 0  0    Altered sleeping 0  0    Tired, decreased energy 0  0    Change in appetite 2  0    Feeling bad or failure about yourself  0  0    Trouble concentrating 0  0    Moving slowly or fidgety/restless 0  0    Suicidal thoughts 0  0    PHQ-9 Score 2  0    Difficult doing work/chores          Information is confidential and restricted. Go to Review Flowsheets to unlock data.    phq 9 is negative   Fall Risk:    02/10/2022    2:17 PM 12/15/2021    9:23 AM 06/17/2021    8:02 AM 12/17/2020    8:31 AM 10/30/2020    9:09 AM  Fall Risk   Falls in the  past year? 0 0 1 0 0  Number falls in  past yr:  0 0 0 0  Injury with Fall?  0 1 0 0  Risk for fall due to : No Fall Risks No Fall Risks Impaired balance/gait No Fall Risks   Follow up Falls prevention discussed;Education provided;Falls evaluation completed Falls prevention discussed Falls prevention discussed Falls prevention discussed       Functional Status Survey: Is the patient deaf or have difficulty hearing?: Yes Does the patient have difficulty seeing, even when wearing glasses/contacts?: Yes Does the patient have difficulty concentrating, remembering, or making decisions?: No Does the patient have difficulty walking or climbing stairs?: No Does the patient have difficulty dressing or bathing?: No Does the patient have difficulty doing errands alone such as visiting a doctor's office or shopping?: No    Assessment & Plan  1. Tinnitus of right ear  - Ambulatory referral to Vascular Surgery - US Carotid Duplex Bilateral; Future   2. Bacterial vaginosis  - metroNIDAZOLE (METROGEL) 1 % gel; Apply topically daily.  Dispense: 60 g; Refill: 2

## 2022-02-13 ENCOUNTER — Other Ambulatory Visit: Payer: Self-pay | Admitting: Family Medicine

## 2022-02-13 ENCOUNTER — Telehealth: Payer: Self-pay | Admitting: Family Medicine

## 2022-02-13 MED ORDER — METRONIDAZOLE 0.75 % VA GEL: 1.0000 | Freq: Two times a day (BID) | VAGINAL | 0 refills | Status: DC

## 2022-02-13 NOTE — Telephone Encounter (Signed)
Pt was seen by Dr Ancil Boozer on 10/24 and prescribed  metroNIDAZOLE (METROGEL) 1 % gel This is topical. But that is not right she said.  Pt states she needs the  metroNIDAZOLE Vaginal gel .75%.  [id number] - [place], [place]

## 2022-02-13 NOTE — Telephone Encounter (Signed)
Pt states metroNIDAZOLE (METROGEL) 1 % gel is not for vaginal. Can you please fix prescription.

## 2022-02-15 NOTE — Progress Notes (Unsigned)
MRN : [id number]  Olivia King is a 36 y.o. (Jul 05, 1985) female who presents with chief complaint of legs swell.  History of Present Illness:   The patient returns to the office for followup evaluation regarding leg swelling.  The swelling has persisted but with the lymph pump is under much, much better controlled. The pain associated with swelling is decreased. There have not been any interval development of a ulcerations or wounds.  The patient denies problems with the pump, noting it is working well and the leggings are in good condition.  Since the previous visit the patient has been wearing graduated compression stockings and using the lymph pump on a routine basis and  has noted significant improvement in the lymphedema.   Patient stated the lymph pump has been helpful with the treatment of the lymphedema.    No outpatient medications have been marked as taking for the 02/16/22 encounter (Appointment) with Delana Meyer, Dolores Lory, MD.   Current Facility-Administered Medications for the 02/16/22 encounter (Appointment) with Delana Meyer, Dolores Lory, MD  Medication   cyanocobalamin (VITAMIN B12) injection 1,000 mcg    Past Medical History:  Diagnosis Date   Cigarette smoker    COVID-19 virus detected 03/21/2019   Family history of pancreatic cancer 08/2020   MyRisk neg except SDHA VUS; IBIS=9.2%/riskscore=7.3%   IUD contraception 02/06/2016   Placed January 23, 2439 by Ardeth Perfect, PA [facility]   Major depressive disorder, recurrent ([place]) 08/19/2015   zoloft made her cry; cymbalta made her too sleepy   Migraine headache     Past Surgical History:  Procedure Laterality Date   CESAREAN SECTION  2004 &2012   contraceptive implant      Social History Social History   Tobacco Use   Smoking status: Every Day    Years: 9.00    Types: Cigarettes    Start date: 03/05/2011   Smokeless tobacco: Never   Tobacco comments:    a pack last 2 days now - reported 05/10/2020   Vaping Use   Vaping Use: Some days   Substances: Flavoring  Substance Use Topics   Alcohol use: Yes    Alcohol/week: 0.0 standard drinks of alcohol   Drug use: No    Family History Family History  Problem Relation Age of Onset   Hypertension Mother    Pancreatic cancer Mother 65   Hypertension Brother    Aneurysm Maternal Grandmother    Heart attack Maternal Grandfather    Hypertension Paternal Grandmother    Heart failure Sister     Allergies  Allergen Reactions   Sumatriptan Anaphylaxis    Throat and tongue swelling  Throat and tongue swelling  Throat and tongue swelling    Penicillins Rash   Vortioxetine Other (See Comments)    Tingling hands and blurred vision  Tingling hands and blurred vision  Tingling hands and blurred vision    Oxybate      REVIEW OF SYSTEMS (Negative unless checked)  Constitutional: '[]'$ Weight loss  '[]'$ Fever  '[]'$ Chills Cardiac: '[]'$ Chest pain   '[]'$ Chest pressure   '[]'$ Palpitations   '[]'$ Shortness of breath when laying flat   '[]'$ Shortness of breath with exertion. Vascular:  '[]'$ Pain in legs with walking   '[x]'$ Pain in legs with standing  '[]'$ History of DVT   '[]'$ Phlebitis   '[x]'$ Swelling in legs   '[]'$ Varicose veins   '[]'$ Non-healing ulcers Pulmonary:   '[]'$ Uses home oxygen   '[]'$ Productive cough   '[]'$ Hemoptysis   '[]'$ Wheeze  '[]'$ COPD   '[]'$ Asthma Neurologic:  '[]'$   Dizziness   '[]'$ Seizures   '[]'$ History of stroke   '[]'$ History of TIA  '[]'$ Aphasia   '[]'$ Vissual changes   '[]'$ Weakness or numbness in arm   '[]'$ Weakness or numbness in leg Musculoskeletal:   '[]'$ Joint swelling   '[]'$ Joint pain   '[]'$ Low back pain Hematologic:  '[]'$ Easy bruising  '[]'$ Easy bleeding   '[]'$ Hypercoagulable state   '[]'$ Anemic Gastrointestinal:  '[]'$ Diarrhea   '[]'$ Vomiting  '[]'$ Gastroesophageal reflux/heartburn   '[]'$ Difficulty swallowing. Genitourinary:  '[]'$ Chronic kidney disease   '[]'$ Difficult urination  '[]'$ Frequent urination   '[]'$ Blood in urine Skin:  '[]'$ Rashes   '[]'$ Ulcers  Psychological:  '[]'$ History of anxiety   '[]'$  History of major  depression.  Physical Examination  There were no vitals filed for this visit. There is no height or weight on file to calculate BMI. Gen: WD/WN, NAD Head: [place]/AT, No temporalis wasting.  Ear/Nose/Throat: Hearing grossly intact, nares w/o erythema or drainage, pinna without lesions Eyes: PER, EOMI, sclera nonicteric.  Neck: Supple, no gross masses.  No JVD.  Pulmonary:  Good air movement, no audible wheezing, no use of accessory muscles.  Cardiac: RRR, precordium not hyperdynamic. Vascular:  scattered varicosities present bilaterally.  Mild venous stasis changes to the legs bilaterally.  3-4+ soft pitting edema, CEAP [id number]  Vessel Right Left  Radial Palpable Palpable  Gastrointestinal: soft, non-distended. No guarding/no peritoneal signs.  Musculoskeletal: M/S 5/5 throughout.  No deformity.  Neurologic: CN 2-12 intact. Pain and light touch intact in extremities.  Symmetrical.  Speech is fluent. Motor exam as listed above. Psychiatric: Judgment intact, Mood & affect appropriate for pt's clinical situation. Dermatologic: Venous rashes no ulcers noted.  No changes consistent with cellulitis. Lymph : No lichenification or skin changes of chronic lymphedema.  CBC Lab Results  Component Value Date   WBC 9.2 12/15/2021   HGB 12.5 12/15/2021   HCT 37.6 12/15/2021   MCV 90.8 12/15/2021   PLT 323 12/15/2021    BMET    Component Value Date/Time   NA 138 12/15/2021 1001   NA 137 09/17/2015 1019   K 4.1 12/15/2021 1001   CL 111 (H) 12/15/2021 1001   CO2 19 (L) 12/15/2021 1001   GLUCOSE 95 12/15/2021 1001   BUN 9 12/15/2021 1001   BUN 8 09/17/2015 1019   CREATININE 1.03 (H) 12/15/2021 1001   CALCIUM 9.1 12/15/2021 1001   GFRNONAA >60 09/07/2021 2154   GFRNONAA 86 05/10/2019 1617   GFRAA >60 08/25/2019 1214   GFRAA 99 05/10/2019 1617   CrCl cannot be calculated (Patient's most recent lab result is older than the maximum 21 days allowed.).  COAG No results found for: "INR",  "PROTIME"  Radiology No results found.   Assessment/Plan There are no diagnoses linked to this encounter.   Hortencia Pilar, MD  02/15/2022 11:05 AM

## 2022-02-16 ENCOUNTER — Encounter (INDEPENDENT_AMBULATORY_CARE_PROVIDER_SITE_OTHER): Payer: Self-pay

## 2022-02-16 ENCOUNTER — Ambulatory Visit (INDEPENDENT_AMBULATORY_CARE_PROVIDER_SITE_OTHER): Admitting: Vascular Surgery

## 2022-02-16 ENCOUNTER — Encounter (INDEPENDENT_AMBULATORY_CARE_PROVIDER_SITE_OTHER): Payer: Self-pay | Admitting: Vascular Surgery

## 2022-02-16 VITALS — BP 121/87 | HR 72 | Resp 16 | Wt 225.0 lb

## 2022-02-16 DIAGNOSIS — I890 Lymphedema, not elsewhere classified: Secondary | ICD-10-CM | POA: Diagnosis not present

## 2022-02-16 DIAGNOSIS — R42 Dizziness and giddiness: Secondary | ICD-10-CM | POA: Diagnosis not present

## 2022-02-16 DIAGNOSIS — I872 Venous insufficiency (chronic) (peripheral): Secondary | ICD-10-CM

## 2022-02-16 NOTE — Telephone Encounter (Signed)
Pt notified, she request refills. Please advice

## 2022-02-18 ENCOUNTER — Encounter (INDEPENDENT_AMBULATORY_CARE_PROVIDER_SITE_OTHER): Payer: Self-pay | Admitting: Vascular Surgery

## 2022-02-18 DIAGNOSIS — R42 Dizziness and giddiness: Secondary | ICD-10-CM | POA: Insufficient documentation

## 2022-03-15 DIAGNOSIS — R55 Syncope and collapse: Secondary | ICD-10-CM | POA: Insufficient documentation

## 2022-03-15 NOTE — Progress Notes (Signed)
MRN : [id number]  Olivia King is a 36 y.o. (1986-02-17) female who presents with chief complaint of legs swell.  History of Present Illness:   The patient returns today for follow up regarding syncope. The patient describes it as a light headedness and denies the "room spinning".  It lasted on the order of minutes and resolved completely.  There has been no loss of consciousness.  There have been several episodes over the past year or so.  She she also can hear a bruit especially when she lays down.   There is no recent history of TIA symptoms or focal motor deficits. There is no prior documented CVA.   The patient was not taking enteric-coated aspirin 81 mg daily at the time.   There is a history of migraine headaches. There is no history of seizures.  The patient is also followed for leg swelling.  The swelling has improved quite a bit and the pain associated with swelling has decreased substantially. There have not been any interval development of a ulcerations or wounds.   Since the previous visit the patient has been wearing graduated compression stockings and has noted significant improvement in the lymphedema. The patient has been using compression routinely morning until night.   The patient also states elevation during the day and exercise (such as walking) is being done too.   Duplex ultrasound of the carotid arteries shows 1-39% stenosis bilaterally.  No outpatient medications have been marked as taking for the 03/16/22 encounter (Appointment) with Delana Meyer, Dolores Lory, MD.   Current Facility-Administered Medications for the 03/16/22 encounter (Appointment) with Delana Meyer, Dolores Lory, MD  Medication   cyanocobalamin (VITAMIN B12) injection 1,000 mcg    Past Medical History:  Diagnosis Date   Cigarette smoker    COVID-19 virus detected 03/21/2019   Family history of pancreatic cancer 08/2020   MyRisk neg except SDHA VUS; IBIS=9.2%/riskscore=7.3%   IUD  contraception 02/06/2016   Placed January 23, 5092 by Ardeth Perfect, PA [facility]   Major depressive disorder, recurrent ([place]) 08/19/2015   zoloft made her cry; cymbalta made her too sleepy   Migraine headache     Past Surgical History:  Procedure Laterality Date   CESAREAN SECTION  2004 &2012   contraceptive implant      Social History Social History   Tobacco Use   Smoking status: Every Day    Years: 9.00    Types: Cigarettes    Start date: 03/05/2011   Smokeless tobacco: Never   Tobacco comments:    a pack last 2 days now - reported 05/10/2020  Vaping Use   Vaping Use: Some days   Substances: Flavoring  Substance Use Topics   Alcohol use: Yes    Alcohol/week: 0.0 standard drinks of alcohol   Drug use: No    Family History Family History  Problem Relation Age of Onset   Hypertension Mother    Pancreatic cancer Mother 13   Hypertension Brother    Aneurysm Maternal Grandmother    Heart attack Maternal Grandfather    Hypertension Paternal Grandmother    Heart failure Sister     Allergies  Allergen Reactions   Sumatriptan Anaphylaxis    Throat and tongue swelling  Throat and tongue swelling  Throat and tongue swelling    Penicillins Rash   Vortioxetine Other (See Comments)    Tingling hands and blurred vision  Tingling hands and blurred vision  Tingling hands and blurred vision    Oxybate  REVIEW OF SYSTEMS (Negative unless checked)  Constitutional: '[]'$ Weight loss  '[]'$ Fever  '[]'$ Chills Cardiac: '[]'$ Chest pain   '[]'$ Chest pressure   '[]'$ Palpitations   '[]'$ Shortness of breath when laying flat   '[]'$ Shortness of breath with exertion. Vascular:  '[]'$ Pain in legs with walking   '[x]'$ Pain in legs with standing  '[]'$ History of DVT   '[]'$ Phlebitis   '[x]'$ Swelling in legs   '[]'$ Varicose veins   '[]'$ Non-healing ulcers Pulmonary:   '[]'$ Uses home oxygen   '[]'$ Productive cough   '[]'$ Hemoptysis   '[]'$ Wheeze  '[]'$ COPD   '[]'$ Asthma Neurologic:  '[x]'$ Dizziness   '[]'$ Seizures   '[]'$ History of stroke    '[]'$ History of TIA  '[]'$ Aphasia   '[]'$ Vissual changes   '[]'$ Weakness or numbness in arm   '[]'$ Weakness or numbness in leg Musculoskeletal:   '[]'$ Joint swelling   '[x]'$ Joint pain   '[]'$ Low back pain Hematologic:  '[]'$ Easy bruising  '[]'$ Easy bleeding   '[]'$ Hypercoagulable state   '[]'$ Anemic Gastrointestinal:  '[]'$ Diarrhea   '[]'$ Vomiting  '[]'$ Gastroesophageal reflux/heartburn   '[]'$ Difficulty swallowing. Genitourinary:  '[]'$ Chronic kidney disease   '[]'$ Difficult urination  '[]'$ Frequent urination   '[]'$ Blood in urine Skin:  '[]'$ Rashes   '[]'$ Ulcers  Psychological:  '[]'$ History of anxiety   '[]'$  History of major depression.  Physical Examination  There were no vitals filed for this visit. There is no height or weight on file to calculate BMI. Gen: WD/WN, NAD Head: [place]/AT, No temporalis wasting.  Ear/Nose/Throat: Hearing grossly intact, nares w/o erythema or drainage, pinna without lesions Eyes: PER, EOMI, sclera nonicteric.  Neck: Supple, no gross masses.  No JVD.  Pulmonary:  Good air movement, no audible wheezing, no use of accessory muscles.  Cardiac: RRR, precordium not hyperdynamic. Vascular:  scattered varicosities present bilaterally.  Mild venous stasis changes to the legs bilaterally.  1-2+ soft pitting edema, CEAP C4sEpAsPr  Vessel Right Left  Radial Palpable Palpable  Gastrointestinal: soft, non-distended. No guarding/no peritoneal signs.  Musculoskeletal: M/S 5/5 throughout.  No deformity.  Neurologic: CN 2-12 intact. Pain and light touch intact in extremities.  Symmetrical.  Speech is fluent. Motor exam as listed above. Psychiatric: Judgment intact, Mood & affect appropriate for pt's clinical situation. Dermatologic: Venous rashes no ulcers noted.  No changes consistent with cellulitis. Lymph : No lichenification or skin changes of chronic lymphedema.  CBC Lab Results  Component Value Date   WBC 9.2 12/15/2021   HGB 12.5 12/15/2021   HCT 37.6 12/15/2021   MCV 90.8 12/15/2021   PLT 323 12/15/2021    BMET    Component  Value Date/Time   NA 138 12/15/2021 1001   NA 137 09/17/2015 1019   K 4.1 12/15/2021 1001   CL 111 (H) 12/15/2021 1001   CO2 19 (L) 12/15/2021 1001   GLUCOSE 95 12/15/2021 1001   BUN 9 12/15/2021 1001   BUN 8 09/17/2015 1019   CREATININE 1.03 (H) 12/15/2021 1001   CALCIUM 9.1 12/15/2021 1001   GFRNONAA >60 09/07/2021 2154   GFRNONAA 86 05/10/2019 1617   GFRAA >60 08/25/2019 1214   GFRAA 99 05/10/2019 1617   CrCl cannot be calculated (Patient's most recent lab result is older than the maximum 21 days allowed.).  COAG No results found for: "INR", "PROTIME"  Radiology No results found.   Assessment/Plan 1. Syncope, unspecified syncope type Recommend:  The patient can here a bruit and has had syncope.  We must ensure that she does not have FMD of the distal ICA.  Patient should undergo MR angiography of the neck and carotid arteries to define the  degree of stenosis of the internal carotid arteries bilaterally and the anatomic suitability for surgery vs. intervention.  The risks, benefits and alternative therapies were reviewed in detail with the patient.  All questions were answered.  The patient agrees to proceed with imaging.  Continue antiplatelet therapy as prescribed. Continue management of CAD, HTN and Hyperlipidemia. Healthy heart diet, encouraged exercise at least 4 times per week.  - MR ANGIO NECK W WO CONTRAST; Future  2. Carotid bruit, unspecified laterality See #1 - MR ANGIO NECK W WO CONTRAST; Future  3. Lymphedema No surgery or intervention at this point in time.   The patient is CEAP C4sEpAsPr   I have discussed with the patient venous insufficiency and why it  causes symptoms. I have discussed with the patient the chronic skin changes that accompany venous insufficiency and the long term sequela such as infection and ulceration.  Patient will begin wearing graduated compression stockings or compression wraps on a daily basis.  The patient will put the  compression on first thing in the morning and removing them in the evening. The patient is instructed specifically not to sleep in the compression.    In addition, behavioral modification including several periods of elevation of the lower extremities during the day will be continued. I have demonstrated that proper elevation is a position with the ankles at heart level.  The patient is instructed to begin routine exercise, especially walking on a daily basis  The patient will be assessed for a Lymph Pump depending on the effectiveness of conservative therapy and the control of the associated lymphedema.  4. Chronic venous insufficiency No surgery or intervention at this point in time.   The patient is CEAP C4sEpAsPr   I have discussed with the patient venous insufficiency and why it  causes symptoms. I have discussed with the patient the chronic skin changes that accompany venous insufficiency and the long term sequela such as infection and ulceration.  Patient will begin wearing graduated compression stockings or compression wraps on a daily basis.  The patient will put the compression on first thing in the morning and removing them in the evening. The patient is instructed specifically not to sleep in the compression.    In addition, behavioral modification including several periods of elevation of the lower extremities during the day will be continued. I have demonstrated that proper elevation is a position with the ankles at heart level.  The patient is instructed to begin routine exercise, especially walking on a daily basis  The patient will be assessed for a Lymph Pump depending on the effectiveness of conservative therapy and the control of the associated lymphedema.    Hortencia Pilar, MD  03/15/2022 4:49 PM

## 2022-03-16 ENCOUNTER — Encounter (INDEPENDENT_AMBULATORY_CARE_PROVIDER_SITE_OTHER): Payer: Self-pay | Admitting: Vascular Surgery

## 2022-03-16 ENCOUNTER — Ambulatory Visit (INDEPENDENT_AMBULATORY_CARE_PROVIDER_SITE_OTHER)

## 2022-03-16 ENCOUNTER — Ambulatory Visit (INDEPENDENT_AMBULATORY_CARE_PROVIDER_SITE_OTHER): Admitting: Vascular Surgery

## 2022-03-16 VITALS — BP 106/74 | HR 90 | Resp 16 | Wt 226.0 lb

## 2022-03-16 DIAGNOSIS — I890 Lymphedema, not elsewhere classified: Secondary | ICD-10-CM

## 2022-03-16 DIAGNOSIS — R0989 Other specified symptoms and signs involving the circulatory and respiratory systems: Secondary | ICD-10-CM | POA: Diagnosis not present

## 2022-03-16 DIAGNOSIS — I872 Venous insufficiency (chronic) (peripheral): Secondary | ICD-10-CM | POA: Diagnosis not present

## 2022-03-16 DIAGNOSIS — R55 Syncope and collapse: Secondary | ICD-10-CM | POA: Diagnosis not present

## 2022-03-16 DIAGNOSIS — R42 Dizziness and giddiness: Secondary | ICD-10-CM

## 2022-03-20 ENCOUNTER — Ambulatory Visit: Admitting: Family Medicine

## 2022-03-21 ENCOUNTER — Encounter (INDEPENDENT_AMBULATORY_CARE_PROVIDER_SITE_OTHER): Payer: Self-pay | Admitting: Vascular Surgery

## 2022-03-21 DIAGNOSIS — R0989 Other specified symptoms and signs involving the circulatory and respiratory systems: Secondary | ICD-10-CM | POA: Insufficient documentation

## 2022-03-24 NOTE — Progress Notes (Unsigned)
Name: Olivia King   MRN: [id number]    DOB: September 28, 1985   Date:03/25/2022       Progress Note  Subjective  Chief Complaint  Follow Up  HPI  Lymphedema: she went to vascular surgeon, and was advised to use compression stocking hoses and because of sob advised to see cardiologist. 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 Seen by vascular surgeon   Tachycardia: Dr. Mylo Red gave her metoprolol but she never started, she is doing well now and released from his care Unchanged    Morbid obesity: she states as a teenager she was in the 160 lbs range, but after her son was born in 2004 her weight stayed between 189-203 lbs , had her daughter in 2012 her weight went up to 270 lbs, but after her birth she was able to go back down to 203 lbs. She states in 2016 she took some otc diet pills and was going to the gym and her weight dropped about 40 lbs in a few months, she was back to 165 lbs. She told me back in April 2021 that  she can binge eat. She is able to avoid eating during the day - because she is so busy, but at night when upset with her children or not feeling well she eats to get comfort She states she feels out of control and is unable to stop eating, about twice a week, she feels very guilty afterwards. She states when she overeats she takes magnesium to help her have a bowel movement. She does not make herself vomit We started her on Vyvanse 07/2019 she has noticed that her mood improved and also decrease her urge to eat as much, her weight went up to 243 lbs in August after she had been out of Vyvanse for about 6 months. . She states no longer on phentermine ( got it from weight loss clinic) . She was seen by neurologist and advised to lose weight due to idiopathic intracranial hypertension. She also has BMI over 40 and  pre-diabetes. Weight is trending up, unable to get Wegovy due to nationwide shortage , we will give her samples of ozempic to start and switch to wegovy at higher doses since available at pharmacy   MDD/GAD/eating disorder: she is under the care of Dr. Cherlyn Cushing but not currently seeing a therapist.  She is compliant with medication, no longer on Vyvanse due to cost and did not work for her  , but taking Effexor and Lamictal and  hydroxizine prn. Trazodone prn for sleep   Migraine headache:She is allergic to Imitrex, takes Excedrin migraine or Fioricet. She states migraine is described  As unilateral, right or left side, however this episode is a little different , not associated with aura, and pain is behind her right eye not above. She is seeing neurologist and is weaning self off topamax and is going to go on Diamox   Hydradenitis suppurative: she is now on Metformin and clindamycin lotion and seems to help some. She is seeing Dr. Milford Cage at [facility]. She is currently off doxy due to Idiopathic hypertension, they tried spironolactone but cannot take it since starting on diamox   Idiopathic intracranial Hypertension: diagnosed by neurologist 02/23 due to pulsating tinnitus and abnormal MRI brain, she is switching now from topamax to Diamox.  Low vitamin B12: level of 251 on 05/19/21 done by neurologist . Folic acid also low, we will give her B12 injection today, and resume otc SL 500 mcg daily and continue folic acid daily, we will recheck labs next visit   Patient Active Problem List   Diagnosis Date Noted   Carotid bruit 03/21/2022   Syncope 03/15/2022   Vertigo 02/18/2022   Insomnia 10/23/2021   Hidradenitis suppurativa 10/30/2020   Family history of pancreatic cancer 08/16/2020   MDD (major depressive disorder), recurrent episode, moderate ([place]) 02/13/2020   Tobacco use disorder 02/13/2020   GAD (generalized anxiety disorder) 10/10/2019   Eating disorder 10/10/2019   Lymphedema  04/10/2019   Chronic venous insufficiency 04/10/2019   Bilateral carpal tunnel syndrome 11/04/2018   Morbid obesity ([place]) 06/14/2017   Contraception management 01/06/2016   Low serum vitamin D 12/24/2014   Migraine without aura 12/21/2014    Past Surgical History:  Procedure Laterality Date   CESAREAN SECTION  2004 &2012   contraceptive implant      Family History  Problem Relation Age of Onset   Hypertension Mother    Pancreatic cancer Mother 22   Hypertension Brother    Aneurysm Maternal Grandmother    Heart attack Maternal Grandfather    Hypertension Paternal Grandmother    Heart failure Sister     Social History   Tobacco Use   Smoking status: Every Day    Years: 9.00    Types: Cigarettes    Start date: 03/05/2011   Smokeless tobacco: Never   Tobacco comments:    a pack last 2 days now - reported 05/10/2020  Substance Use Topics   Alcohol use: Yes    Alcohol/week: 0.0 standard drinks of alcohol     Current Outpatient Medications:    cetirizine (ZYRTEC) 10 MG tablet, Take 10 mg by mouth daily., Disp: , Rfl:    clindamycin (CLEOCIN T) 1 % lotion, Apply topically 2 (two) times daily., Disp: 60 mL, Rfl: 2   EPINEPHrine 0.3 mg/0.3 mL IJ SOAJ injection, Inject into the muscle., Disp: , Rfl:    HYDROXYZINE PAMOATE PO, Take 10 mg by mouth every 6 (six) hours as needed., Disp: , Rfl:    ipratropium (ATROVENT) 0.03 % nasal spray, ipratropium bromide 21 mcg (0.03 %) nasal spray, Disp: , Rfl:    lamoTRIgine (LAMICTAL) 25 MG tablet, Take 1 tablet (25 mg total) by mouth daily., Disp: 90 tablet, Rfl: 0   metroNIDAZOLE (METROGEL) 0.75 % vaginal gel, Place 1 Applicatorful vaginally 2 (two) times daily., Disp: 70 g, Rfl: 0   Multiple Vitamin (MULTI-VITAMINS) TABS, Take by mouth., Disp: , Rfl:    Semaglutide-Weight Management (WEGOVY) 0.25 MG/0.5ML SOAJ, Inject 0.25 mg into the skin once a week., Disp: 2 mL, Rfl: 0   topiramate (TOPAMAX) 50 MG tablet, Take 150 mg by mouth at  bedtime., Disp: , Rfl:    traZODone (DESYREL) 50 MG tablet, Take 0.5-1 tablets (25-50 mg total) by mouth at bedtime as needed for sleep., Disp: 30 tablet, Rfl: 1   venlafaxine XR (EFFEXOR-XR) 75 MG 24 hr capsule, TAKE 1 CAPSULE(75 MG) BY MOUTH DAILY WITH BREAKFAST, Disp: 90 capsule, Rfl: 0   Vitamin D, Ergocalciferol, (DRISDOL) 1.25 MG (50000 UNIT) CAPS capsule, Take 50,000 Units by mouth once a week., Disp: , Rfl:   Current Facility-Administered Medications:    cyanocobalamin (VITAMIN B12) injection 1,000 mcg, 1,000 mcg, Intramuscular, Once, Steele Sizer, MD  Allergies  Allergen Reactions   Sumatriptan Anaphylaxis  Throat and tongue swelling  Throat and tongue swelling  Throat and tongue swelling    Penicillins Rash   Vortioxetine Other (See Comments)    Tingling hands and blurred vision  Tingling hands and blurred vision  Tingling hands and blurred vision    Oxybate     I personally reviewed active problem list, medication list, allergies, family history, social history, health maintenance with the patient/caregiver today.   ROS  Ten systems reviewed and is negative except as mentioned in HPI   Objective  Vitals:   03/25/22 0854  BP: 126/70  Pulse: 91  Resp: 16  SpO2: 98%  Weight: 230 lb (104.3 kg)  Height: '5\' 3"'$  (1.6 m)    Body mass index is 40.74 kg/m.  Physical Exam  Constitutional: Patient appears well-developed and well-nourished. Obese  No distress.  HEENT: head atraumatic, normocephalic, pupils equal and reactive to light, neck supple Cardiovascular: Normal rate, regular rhythm and normal heart sounds.  No murmur heard. No BLE edema. Pulmonary/Chest: Effort normal and breath sounds normal. No respiratory distress. Abdominal: Soft.  There is no tenderness. Psychiatric: Patient has a normal mood and affect. behavior is normal. Judgment and thought content normal.   PHQ2/9:    03/25/2022    8:53 AM 02/10/2022    2:17 PM 02/05/2022    1:14 PM  12/15/2021    9:23 AM 11/27/2021    5:01 PM  Depression screen PHQ 2/9  Decreased Interest 0 0  0   Down, Depressed, Hopeless 0 0  0   PHQ - 2 Score 0 0  0   Altered sleeping 0 0  0   Tired, decreased energy 0 0  0   Change in appetite 0 2  0   Feeling bad or failure about yourself  0 0  0   Trouble concentrating 0 0  0   Moving slowly or fidgety/restless 0 0  0   Suicidal thoughts 0 0  0   PHQ-9 Score 0 2  0   Difficult doing work/chores          Information is confidential and restricted. Go to Review Flowsheets to unlock data.    phq 9 is negative   Fall Risk:    03/25/2022    8:53 AM 02/10/2022    2:17 PM 12/15/2021    9:23 AM 06/17/2021    8:02 AM 12/17/2020    8:31 AM  [place] in the past year? 0 0 0 1 0  Number falls in past yr: 0  0 0 0  Injury with Fall? 0  0 1 0  Risk for fall due to : No Fall Risks No Fall Risks No Fall Risks Impaired balance/gait No Fall Risks  Follow up Falls prevention discussed Falls prevention discussed;Education provided;Falls evaluation completed Falls prevention discussed Falls prevention discussed Falls prevention discussed      Functional Status Survey: Is the patient deaf or have difficulty hearing?: Yes Does the patient have difficulty seeing, even when wearing glasses/contacts?: Yes Does the patient have difficulty concentrating, remembering, or making decisions?: No Does the patient have difficulty walking or climbing stairs?: No Does the patient have difficulty dressing or bathing?: No Does the patient have difficulty doing errands alone such as visiting a doctor's office or shopping?: No    Assessment & Plan  1. Idiopathic intracranial hypertension  Keep follow up with neurologist, switching to diamox   2. Morbid obesity ([place])  - Semaglutide-Weight Management 1 MG/0.5ML  SOAJ; Inject 1 mg into the skin once a week for 28 days.  Dispense: 2 mL; Refill: 0 - Semaglutide-Weight Management 1.7 MG/0.75ML SOAJ; Inject  1.7 mg into the skin once a week for 28 days.  Dispense: 3 mL; Refill: 0 - Semaglutide-Weight Management 2.4 MG/0.75ML SOAJ; Inject 2.4 mg into the skin once a week for 28 days.  Dispense: 3 mL; Refill: 0  3. B12 deficiency  - cyanocobalamin (VITAMIN B12) injection 1,000 mcg  4. Migraine without aura and without status migrainosus, not intractable  Keep follow up with neurologist   5. Lymphedema  Keep follow up with vascular surgeon  6. Dyslipidemia  On diet only   7. GAD (generalized anxiety disorder)   8. Hidradenitis suppurativa  Sees dermatologist   9. Moderate episode of recurrent major depressive disorder (HCC)   Keep follow up with Dr. Shea Evans   10. Viral illness  - Novel Coronavirus, NAA ([facility])

## 2022-03-25 ENCOUNTER — Ambulatory Visit: Admitting: Family Medicine

## 2022-03-25 ENCOUNTER — Encounter: Payer: Self-pay | Admitting: Family Medicine

## 2022-03-25 VITALS — BP 126/70 | HR 91 | Resp 16 | Ht 63.0 in | Wt 230.0 lb

## 2022-03-25 DIAGNOSIS — G43009 Migraine without aura, not intractable, without status migrainosus: Secondary | ICD-10-CM | POA: Diagnosis not present

## 2022-03-25 DIAGNOSIS — E538 Deficiency of other specified B group vitamins: Secondary | ICD-10-CM

## 2022-03-25 DIAGNOSIS — L732 Hidradenitis suppurativa: Secondary | ICD-10-CM

## 2022-03-25 DIAGNOSIS — B349 Viral infection, unspecified: Secondary | ICD-10-CM

## 2022-03-25 DIAGNOSIS — G932 Benign intracranial hypertension: Secondary | ICD-10-CM

## 2022-03-25 DIAGNOSIS — E785 Hyperlipidemia, unspecified: Secondary | ICD-10-CM

## 2022-03-25 DIAGNOSIS — I890 Lymphedema, not elsewhere classified: Secondary | ICD-10-CM

## 2022-03-25 DIAGNOSIS — F411 Generalized anxiety disorder: Secondary | ICD-10-CM

## 2022-03-25 DIAGNOSIS — F331 Major depressive disorder, recurrent, moderate: Secondary | ICD-10-CM

## 2022-03-25 MED ORDER — SEMAGLUTIDE-WEIGHT MANAGEMENT 2.4 MG/0.75ML ~~LOC~~ SOAJ: 2.4000 mg | SUBCUTANEOUS | 0 refills | Status: AC

## 2022-03-25 MED ORDER — SEMAGLUTIDE-WEIGHT MANAGEMENT 1 MG/0.5ML ~~LOC~~ SOAJ: 1.0000 mg | SUBCUTANEOUS | 0 refills | Status: AC

## 2022-03-25 MED ORDER — CYANOCOBALAMIN 1000 MCG/ML IJ SOLN
1000.0000 ug | Freq: Once | INTRAMUSCULAR | Status: AC
  Administered 2022-03-25: 1000 ug via INTRAMUSCULAR

## 2022-03-25 MED ORDER — SEMAGLUTIDE-WEIGHT MANAGEMENT 1.7 MG/0.75ML ~~LOC~~ SOAJ: 1.7000 mg | SUBCUTANEOUS | 0 refills | Status: AC

## 2022-03-25 NOTE — Patient Instructions (Signed)
Zepbound is the same as [employer]

## 2022-03-27 NOTE — Addendum Note (Signed)
Addended by: Carlene Coria on: 03/27/2022 11:21 AM   Modules accepted: Orders

## 2022-03-30 LAB — SPECIMEN STATUS REPORT

## 2022-03-30 LAB — NOVEL CORONAVIRUS, NAA

## 2022-04-02 ENCOUNTER — Telehealth (INDEPENDENT_AMBULATORY_CARE_PROVIDER_SITE_OTHER): Payer: Self-pay | Admitting: Vascular Surgery

## 2022-04-02 NOTE — Telephone Encounter (Signed)
LVM for pt TCB and make a CT results appt with Dr. Delana Meyer. Procedure is 12.16.23.

## 2022-04-04 ENCOUNTER — Ambulatory Visit
Admission: RE | Admit: 2022-04-04 | Discharge: 2022-04-04 | Disposition: A | Discharge status: Home / Self Care | Source: Ambulatory Visit | Attending: Vascular Surgery | Admitting: Vascular Surgery

## 2022-04-04 DIAGNOSIS — R0989 Other specified symptoms and signs involving the circulatory and respiratory systems: Secondary | ICD-10-CM | POA: Insufficient documentation

## 2022-04-04 DIAGNOSIS — R55 Syncope and collapse: Secondary | ICD-10-CM | POA: Insufficient documentation

## 2022-04-04 MED ORDER — GADOBUTROL 1 MMOL/ML IV SOLN
8.5000 mL | Freq: Once | INTRAVENOUS | Status: AC | PRN
  Administered 2022-04-04: 10 mL via INTRAVENOUS

## 2022-04-04 MED ORDER — GADOBUTROL 1 MMOL/ML IV SOLN: 10.0000 mL | Freq: Once | INTRAVENOUS | Status: DC | PRN

## 2022-04-07 ENCOUNTER — Other Ambulatory Visit: Payer: Self-pay | Admitting: Family Medicine

## 2022-04-14 NOTE — Progress Notes (Signed)
MRN : [id number]  Olivia King is a 36 y.o. ([birth year]) female who presents with chief complaint of check ringing in the ears and check circulation.  History of Present Illness:   The patient is seen for follow up regarding a syncopal episode. In the past she noted an episode described it as a light headedness and denies the "room spinning".  It lasted on the order of minutes and resolved completely.  There was no loss of consciousness.  There have been two or three prior episodes over the past several years.  Since the last visit she feels the symptoms are a bit worse with respect to the ringing in her ear and she is concerned about maybe some hearing loss on the right.  There is no recent history of TIA symptoms or focal motor deficits. There is no prior documented CVA.  There is no history of migraine headaches or prior diagnosis of ocular migraine. There is no history of seizures.  MRA of the neck is essentially normal no evidence of FMD or other vascular abnormality.   No masses identified.  No outpatient medications have been marked as taking for the 04/23/22 encounter (Appointment) with Delana Meyer, Dolores Lory, MD.    Past Medical History:  Diagnosis Date   Cigarette smoker    COVID-19 virus detected 03/21/2019   Family history of pancreatic cancer 08/2020   MyRisk neg except SDHA VUS; IBIS=9.2%/riskscore=7.3%   IUD contraception 02/06/2016   Placed January 22, 785 by Ardeth Perfect, PA [facility]   Major depressive disorder, recurrent ([place]) 08/19/2015   zoloft made her cry; cymbalta made her too sleepy   Migraine headache     Past Surgical History:  Procedure Laterality Date   CESAREAN SECTION  2004 &2012   contraceptive implant      Social History Social History   Tobacco Use   Smoking status: Every Day    Years: 9.00    Types: Cigarettes    Start date: 03/05/2011   Smokeless tobacco: Never   Tobacco comments:    a pack last 2 days now - reported  05/10/2020  Vaping Use   Vaping Use: Some days   Substances: Flavoring  Substance Use Topics   Alcohol use: Yes    Alcohol/week: 0.0 standard drinks of alcohol   Drug use: No    Family History Family History  Problem Relation Age of Onset   Hypertension Mother    Pancreatic cancer Mother 73   Hypertension Brother    Aneurysm Maternal Grandmother    Heart attack Maternal Grandfather    Hypertension Paternal Grandmother    Heart failure Sister     Allergies  Allergen Reactions   Sumatriptan Anaphylaxis    Throat and tongue swelling  Throat and tongue swelling  Throat and tongue swelling    Penicillins Rash   Vortioxetine Other (See Comments)    Tingling hands and blurred vision  Tingling hands and blurred vision  Tingling hands and blurred vision    Doxycycline     Unable to take it currently due to intracranial hypertension    Oxybate      REVIEW OF SYSTEMS (Negative unless checked)  Constitutional: '[]'$ Weight loss  '[]'$ Fever  '[]'$ Chills Cardiac: '[]'$ Chest pain   '[]'$ Chest pressure   '[]'$ Palpitations   '[]'$ Shortness of breath when laying flat   '[]'$ Shortness of breath with exertion. Vascular:  '[x]'$ Pain in legs with walking   '[]'$ Pain in legs at rest  '[]'$ History of DVT   '[]'$ Phlebitis   '[]'$   Swelling in legs   '[]'$ Varicose veins   '[]'$ Non-healing ulcers Pulmonary:   '[]'$ Uses home oxygen   '[]'$ Productive cough   '[]'$ Hemoptysis   '[]'$ Wheeze  '[]'$ COPD   '[]'$ Asthma Neurologic:  '[]'$ Dizziness   '[]'$ Seizures   '[]'$ History of stroke   '[]'$ History of TIA  '[]'$ Aphasia   '[]'$ Vissual changes   '[]'$ Weakness or numbness in arm   '[]'$ Weakness or numbness in leg Musculoskeletal:   '[]'$ Joint swelling   '[]'$ Joint pain   '[]'$ Low back pain Hematologic:  '[]'$ Easy bruising  '[]'$ Easy bleeding   '[]'$ Hypercoagulable state   '[]'$ Anemic Gastrointestinal:  '[]'$ Diarrhea   '[]'$ Vomiting  '[]'$ Gastroesophageal reflux/heartburn   '[]'$ Difficulty swallowing. Genitourinary:  '[]'$ Chronic kidney disease   '[]'$ Difficult urination  '[]'$ Frequent urination   '[]'$ Blood in urine Skin:  '[]'$ Rashes    '[]'$ Ulcers  Psychological:  '[]'$ History of anxiety   '[]'$  History of major depression.  Physical Examination  There were no vitals filed for this visit. There is no height or weight on file to calculate BMI. Gen: WD/WN, NAD Head: [place]/AT, No temporalis wasting.  Ear/Nose/Throat: Hearing grossly intact, nares w/o erythema or drainage Eyes: PER, EOMI, sclera nonicteric.  Neck: Supple, no masses.  No bruit or JVD.  Pulmonary:  Good air movement, no audible wheezing, no use of accessory muscles.  Cardiac: RRR, normal S1, S2, no Murmurs. Vascular:   Vessel Right Left  Radial Palpable Palpable  Carotid  Palpable  Palpable  Gastrointestinal: soft, non-distended. No guarding/no peritoneal signs.  Musculoskeletal: M/S 5/5 throughout.  No visible deformity.  Neurologic: CN 2-12 intact. Pain and light touch intact in extremities.  Symmetrical.  Speech is fluent. Motor exam as listed above. Psychiatric: Judgment intact, Mood & affect appropriate for pt's clinical situation. Dermatologic: No rashes or ulcers noted.  No changes consistent with cellulitis.   CBC Lab Results  Component Value Date   WBC 9.2 12/15/2021   HGB 12.5 12/15/2021   HCT 37.6 12/15/2021   MCV 90.8 12/15/2021   PLT 323 12/15/2021    BMET    Component Value Date/Time   NA 138 12/15/2021 1001   NA 137 09/17/2015 1019   K 4.1 12/15/2021 1001   CL 111 (H) 12/15/2021 1001   CO2 19 (L) 12/15/2021 1001   GLUCOSE 95 12/15/2021 1001   BUN 9 12/15/2021 1001   BUN 8 09/17/2015 1019   CREATININE 1.03 (H) 12/15/2021 1001   CALCIUM 9.1 12/15/2021 1001   GFRNONAA >60 09/07/2021 2154   GFRNONAA 86 05/10/2019 1617   GFRAA >60 08/25/2019 1214   GFRAA 99 05/10/2019 1617   CrCl cannot be calculated (Patient's most recent lab result is older than the maximum 21 days allowed.).  COAG No results found for: "INR", "PROTIME"  Radiology MR ANGIO NECK W WO CONTRAST  Result Date: 04/06/2022 CLINICAL DATA:  Syncope/presyncope. Right  ear pulsatile tinnitus 3 years. EXAM: MRA NECK WITHOUT AND WITH CONTRAST TECHNIQUE: Multiplanar and multiecho pulse sequences of the neck were obtained without and with intravenous contrast. Angiographic images of the neck were obtained using MRA technique without and with intravenous contrast. CONTRAST:  55m GADAVIST GADOBUTROL 1 MMOL/ML IV SOLN COMPARISON:  None Available. FINDINGS: Normal aortic arch and proximal great vessels. Internal carotid artery normal bilaterally. No stenosis or dissection. No vascular malformation Both vertebral arteries are normal without stenosis or dissection or vascular malformation. Antegrade flow in the carotid and vertebral arteries on 2D time-of-flight imaging. IMPRESSION: Normal MRA neck with contrast. Electronically Signed   By: CFranchot GalloM.D.   On: 04/06/2022 10:35  VAS US CAROTID  Result Date: 04/01/2022 Carotid Arterial Duplex Study Patient Name:  Olivia King  Date of Exam:   03/16/2022 Medical Rec #: [id number]         Accession #:    [id number] Date of Birth: July 16, 1985        Patient Gender: F Patient Age:   66 years Exam Location:  [facility] Procedure:      VAS US CAROTID Referring Phys: Hortencia Pilar --------------------------------------------------------------------------------  Indications: Palpatations heard and felt close to ear drum. Performing Technologist: Almira Coaster RVS  Examination Guidelines: A complete evaluation includes B-mode imaging, spectral Doppler, color Doppler, and power Doppler as needed of all accessible portions of each vessel. Bilateral testing is considered an integral part of a complete examination. Limited examinations for reoccurring indications may be performed as noted.  Right Carotid Findings: +----------+--------+--------+--------+------------------+--------+           PSV cm/sEDV cm/sStenosisPlaque DescriptionComments +----------+--------+--------+--------+------------------+--------+ CCA  Prox  97      25                                         +----------+--------+--------+--------+------------------+--------+ CCA Mid   101     32                                         +----------+--------+--------+--------+------------------+--------+ CCA Distal83      28                                         +----------+--------+--------+--------+------------------+--------+ ICA Prox  43      12                                         +----------+--------+--------+--------+------------------+--------+ ICA Mid   64      23                                         +----------+--------+--------+--------+------------------+--------+ ICA Distal52      24                                         +----------+--------+--------+--------+------------------+--------+ ECA       80      19                                         +----------+--------+--------+--------+------------------+--------+ +----------+--------+-------+--------+-------------------+           PSV cm/sEDV cmsDescribeArm Pressure (mmHG) +----------+--------+-------+--------+-------------------+ Subclavian102     10                                 +----------+--------+-------+--------+-------------------+ +---------+--------+--+--------+--+ VertebralPSV cm/s63EDV cm/s28 +---------+--------+--+--------+--+  Left Carotid Findings: +----------+--------+--------+--------+------------------+--------+           PSV cm/sEDV  cm/sStenosisPlaque DescriptionComments +----------+--------+--------+--------+------------------+--------+ CCA Prox  132     26                                         +----------+--------+--------+--------+------------------+--------+ CCA Mid   102     24                                         +----------+--------+--------+--------+------------------+--------+ CCA Distal83      28                                          +----------+--------+--------+--------+------------------+--------+ ICA Prox  58      18                                         +----------+--------+--------+--------+------------------+--------+ ICA Mid   73      32                                         +----------+--------+--------+--------+------------------+--------+ ICA Distal48      26                                         +----------+--------+--------+--------+------------------+--------+ ECA       88      12                                         +----------+--------+--------+--------+------------------+--------+ +----------+--------+--------+--------+-------------------+           PSV cm/sEDV cm/sDescribeArm Pressure (mmHG) +----------+--------+--------+--------+-------------------+ Subclavian130     0                                   +----------+--------+--------+--------+-------------------+ +---------+--------+--+--------+--+ VertebralPSV cm/s70EDV cm/s34 +---------+--------+--+--------+--+   Summary: Right Carotid: There is no evidence of stenosis in the right ICA. Left Carotid: There is no evidence of stenosis in the left ICA. Vertebrals:  Bilateral vertebral arteries demonstrate antegrade flow. Subclavians: Normal flow hemodynamics were seen in bilateral subclavian              arteries. *See table(s) above for measurements and observations.  Electronically signed by Hortencia Pilar MD on 04/01/2022 at 10:54:26 AM.    Final      Assessment/Plan 1. Syncope, unspecified syncope type Recommend:  Given the patient's MRA which is essentially normal no further invasive testing or surgery at this time.  Duplex ultrasound shows <10% stenosis bilaterally.  I have contacted ENT and she has seen Dr Richardson Landry in the past.  Given the worsening tinnitus and the possibility of hearing loss she will follow up with him to reevaluate.  2. Chronic venous insufficiency Recommend:  No surgery or  intervention at this point in time.  I have reviewed my discussion with the  patient regarding venous insufficiency and why it causes symptoms. I have discussed with the patient the chronic skin changes that accompany venous insufficiency and the long term sequela such as ulceration. Patient will contnue wearing graduated compression stockings on a daily basis, as this has provided excellent control of his edema. The patient will put the stockings on first thing in the morning and removing them in the evening. The patient is reminded not to sleep in the stockings.  In addition, behavioral modification including elevation during the day will be initiated. Exercise is strongly encouraged.  Previous duplex ultrasound of the lower extremities shows normal deep system, no significant superficial reflux was identified.  Given the patient's good control and lack of any problems regarding the venous insufficiency and lymphedema a lymph pump in not need at this time.    The patient will follow up with me PRN should anything change.  The patient voices agreement with this plan.  3. Lymphedema See #2    Hortencia Pilar, MD  04/14/2022 4:24 PM

## 2022-04-23 ENCOUNTER — Encounter (INDEPENDENT_AMBULATORY_CARE_PROVIDER_SITE_OTHER): Payer: Self-pay | Admitting: Vascular Surgery

## 2022-04-23 ENCOUNTER — Ambulatory Visit (INDEPENDENT_AMBULATORY_CARE_PROVIDER_SITE_OTHER): Admitting: Vascular Surgery

## 2022-04-23 VITALS — BP 125/89 | HR 76 | Resp 17 | Ht 63.0 in | Wt 228.0 lb

## 2022-04-23 DIAGNOSIS — R55 Syncope and collapse: Secondary | ICD-10-CM | POA: Diagnosis not present

## 2022-04-23 DIAGNOSIS — I890 Lymphedema, not elsewhere classified: Secondary | ICD-10-CM

## 2022-04-23 DIAGNOSIS — I872 Venous insufficiency (chronic) (peripheral): Secondary | ICD-10-CM | POA: Diagnosis not present

## 2022-04-26 ENCOUNTER — Encounter (INDEPENDENT_AMBULATORY_CARE_PROVIDER_SITE_OTHER): Payer: Self-pay | Admitting: Vascular Surgery

## 2022-05-05 ENCOUNTER — Encounter: Payer: Self-pay | Admitting: Psychiatry

## 2022-05-05 ENCOUNTER — Ambulatory Visit: Admitting: Psychiatry

## 2022-05-05 VITALS — BP 134/91 | HR 108 | Temp 97.8°F | Ht 63.0 in | Wt 229.4 lb

## 2022-05-05 DIAGNOSIS — F17200 Nicotine dependence, unspecified, uncomplicated: Secondary | ICD-10-CM | POA: Diagnosis not present

## 2022-05-05 DIAGNOSIS — F331 Major depressive disorder, recurrent, moderate: Secondary | ICD-10-CM | POA: Diagnosis not present

## 2022-05-05 DIAGNOSIS — G4701 Insomnia due to medical condition: Secondary | ICD-10-CM

## 2022-05-05 DIAGNOSIS — F411 Generalized anxiety disorder: Secondary | ICD-10-CM | POA: Diagnosis not present

## 2022-05-05 MED ORDER — TRAZODONE HCL 50 MG PO TABS: 25.0000 mg | ORAL_TABLET | Freq: Every evening | ORAL | 1 refills | Status: DC | PRN

## 2022-05-05 MED ORDER — VENLAFAXINE HCL ER 75 MG PO CP24: ORAL_CAPSULE | ORAL | 0 refills | Status: DC

## 2022-05-05 MED ORDER — VENLAFAXINE HCL ER 37.5 MG PO CP24: 37.5000 mg | ORAL_CAPSULE | Freq: Every day | ORAL | 0 refills | Status: DC

## 2022-05-05 MED ORDER — HYDROXYZINE HCL 10 MG PO TABS: 10.0000 mg | ORAL_TABLET | Freq: Every evening | ORAL | 0 refills | Status: DC | PRN

## 2022-05-05 MED ORDER — LAMOTRIGINE 25 MG PO TABS: 25.0000 mg | ORAL_TABLET | Freq: Every day | ORAL | 0 refills | Status: DC

## 2022-05-05 NOTE — Progress Notes (Signed)
[facility] MD OP Progress Note  05/05/2022 2:04 PM Olivia King  MRN:  [id number]  Chief Complaint:  Chief Complaint  Patient presents with   Follow-up   Medication Refill   Depression   Anxiety   HPI: Olivia King is a 37 year old female, currently unemployed, lives in [place], widowed, has a history of MDD, GAD, tobacco use disorder was evaluated in office today.  Patient today reports she is trying to find a new job however has not been successful.  That does worry her.  She reports she is kind of tired of staying home.  She is interested in going to [employer] school however when she looked into it it may take her 6 years.  She does not know if she can go to school for another 6 years since her children will be grown by the time she will be done.  That worries her.  She currently struggles with lack of motivation, anhedonia, fatigue, low energy.  She also has anxiety, worrying about things in general, worrying about not being able to find a job.  Not interested in psychotherapy.  Currently compliant on medications.  Agreeable to dosage increase of venlafaxine.  Denies side effects.  Reports sleep as good, the trazodone helps.  Patient denies any suicidality, homicidality or perceptual disturbances.  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  Currently on replacement.  Agrees to follow-up with primary care provider.  Has not been able to get the Chantix approved.  Not interested in nicotine patches or gums.  Continues to smoke cigarettes.  Patient denies any other concerns today.  Visit Diagnosis:    ICD-10-CM   1. MDD (major depressive disorder), recurrent episode, moderate (HCC)  F33.1 venlafaxine XR (EFFEXOR-XR) 75 MG 24 hr capsule    venlafaxine XR (EFFEXOR XR) 37.5 MG 24 hr capsule    2. GAD (generalized anxiety disorder)  F41.1 venlafaxine XR (EFFEXOR-XR) 75 MG 24 hr capsule    venlafaxine XR (EFFEXOR XR) 37.5 MG 24 hr capsule    lamoTRIgine (LAMICTAL) 25 MG  tablet    hydrOXYzine (ATARAX) 10 MG tablet    3. Insomnia due to medical condition  G47.01 traZODone (DESYREL) 50 MG tablet    hydrOXYzine (ATARAX) 10 MG tablet   anxiety    4. Tobacco use disorder  F17.200       Past Psychiatric History: Reviewed past psych history from progress note on 10/10/2019.  Past also medications like Lexapro, Vyvanse, Abilify, Paxil, Zoloft, Cymbalta.  Past Medical History:  Past Medical History:  Diagnosis Date   Cigarette smoker    COVID-19 virus detected 03/21/2019   Family history of pancreatic cancer 08/2020   MyRisk neg except SDHA VUS; IBIS=9.2%/riskscore=7.3%   IUD contraception 02/06/2016   Placed January 23, 1695 by Ardeth Perfect, PA [facility]   Major depressive disorder, recurrent ([place]) 08/19/2015   zoloft made her cry; cymbalta made her too sleepy   Migraine headache     Past Surgical History:  Procedure Laterality Date   CESAREAN SECTION  2004 &2012   contraceptive implant      Family Psychiatric History: Reviewed family psychiatric history from progress note on 10/10/2019.  Family History:  Family History  Problem Relation Age of Onset   Hypertension Mother    Pancreatic cancer Mother 59   Hypertension Brother    Aneurysm Maternal Grandmother    Heart attack Maternal Grandfather    Hypertension Paternal Grandmother    Heart failure Sister  Social History: Reviewed social history from progress note on 10/10/2019. Social History   Socioeconomic History   Marital status: Widowed    Spouse name: Not on file   Number of children: 2   Years of education: Not on file   Highest education level: Associate degree: occupational, Hotel manager, or vocational program  Occupational History   Not on file  Tobacco Use   Smoking status: Every Day    Years: 9.00    Types: Cigarettes    Start date: 03/05/2011   Smokeless tobacco: Never   Tobacco comments:    a pack last 2 days now - reported 05/10/2020  Vaping Use   Vaping Use:  Some days   Substances: Flavoring  Substance and Sexual Activity   Alcohol use: Yes    Alcohol/week: 0.0 standard drinks of alcohol   Drug use: No   Sexual activity: Yes    Partners: Male    Birth control/protection: I.U.D.    Comment: Patient has liletta IUD  Other Topics Concern   Not on file  Social History Narrative   She is a widow, he died 2009/07/13 while he was deployed.    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   Social Determinants of Health   Financial Resource Strain: Low Risk  (09/15/2019)   Overall Financial Resource Strain (CARDIA)    Difficulty of Paying Living Expenses: Not hard at all  Food Insecurity: No Food Insecurity (05/10/2019)   Hunger Vital Sign    Worried About Running Out of Food in the Last Year: Never true    Ran Out of Food in the Last Year: Never true  Transportation Needs: No Transportation Needs (05/10/2019)   PRAPARE - Hydrologist (Medical): No    Lack of Transportation (Non-Medical): No  Physical Activity: Sufficiently Active (09/15/2019)   Exercise Vital Sign    Days of Exercise per Week: 3 days    Minutes of Exercise per Session: 50 min  Stress: No Stress Concern Present (09/15/2019)   [place]    Feeling of Stress : Not at all  Social Connections: Moderately Integrated (09/15/2019)   Social Connection and Isolation Panel [NHANES]    Frequency of Communication with Friends and Family: More than three times a week    Frequency of Social Gatherings with Friends and Family: More than three times a week    Attends Religious Services: More than 4 times per year    Active Member of [employer] or Organizations: Yes    Attends Archivist Meetings: More than 4 times per year    Marital Status: Widowed    Allergies:  Allergies  Allergen Reactions   Sumatriptan Anaphylaxis    Throat and tongue swelling  Throat and tongue swelling  Throat and tongue  swelling    Penicillins Rash   Vortioxetine Other (See Comments)    Tingling hands and blurred vision  Tingling hands and blurred vision  Tingling hands and blurred vision    Doxycycline     Unable to take it currently due to intracranial hypertension    Oxybate     Metabolic Disorder Labs: Lab Results  Component Value Date   HGBA1C 5.0 12/15/2021   MPG 97 12/15/2021   MPG 100 05/10/2019   No results found for: "PROLACTIN" Lab Results  Component Value Date   CHOL 164 12/15/2021   TRIG 67 12/15/2021   HDL 45 (L) 12/15/2021   CHOLHDL 3.6  12/15/2021   VLDL 11 03/19/2016   LDLCALC 104 (H) 12/15/2021   LDLCALC 116 (H) 12/17/2020   Lab Results  Component Value Date   TSH 2.49 12/17/2020   TSH 2.17 05/10/2019    Therapeutic Level Labs: No results found for: "LITHIUM" No results found for: "VALPROATE" No results found for: "CBMZ"  Current Medications: Current Outpatient Medications  Medication Sig Dispense Refill   acetaZOLAMIDE (DIAMOX) 250 MG tablet Take 500 mg by mouth 2 (two) times daily.     cetirizine (ZYRTEC) 10 MG tablet Take 10 mg by mouth daily.     clindamycin (CLEOCIN T) 1 % lotion Apply topically 2 (two) times daily. 60 mL 2   EPINEPHrine 0.3 mg/0.3 mL IJ SOAJ injection Inject into the muscle.     folic acid (FOLVITE) 1 MG tablet Take 1 mg by mouth daily.     hydrOXYzine (ATARAX) 10 MG tablet Take 1 tablet (10 mg total) by mouth at bedtime as needed. 90 tablet 0   ipratropium (ATROVENT) 0.03 % nasal spray ipratropium bromide 21 mcg (0.03 %) nasal spray     metroNIDAZOLE (METROGEL) 0.75 % vaginal gel INSERT 1 APPLICATORFUL VAGINALLY TWICE DAILY 70 g 0   Multiple Vitamin (MULTI-VITAMINS) TABS Take by mouth.     [START ON 05/22/2022] Semaglutide-Weight Management 1 MG/0.5ML SOAJ Inject 1 mg into the skin once a week for 28 days. 2 mL 0   venlafaxine XR (EFFEXOR XR) 37.5 MG 24 hr capsule Take 1 capsule (37.5 mg total) by mouth daily with breakfast. Take along with  75 mg daily , total of 112.5 mg daily 90 capsule 0   vitamin B-12 (CYANOCOBALAMIN) 500 MCG tablet Take 500 mcg by mouth daily.     Vitamin D, Ergocalciferol, (DRISDOL) 1.25 MG (50000 UNIT) CAPS capsule Take 50,000 Units by mouth once a week.     lamoTRIgine (LAMICTAL) 25 MG tablet Take 1 tablet (25 mg total) by mouth daily. 90 tablet 0   [START ON 06/20/2022] Semaglutide-Weight Management 1.7 MG/0.75ML SOAJ Inject 1.7 mg into the skin once a week for 28 days. (Patient not taking: Reported on 05/05/2022) 3 mL 0   [START ON 07/19/2022] Semaglutide-Weight Management 2.4 MG/0.75ML SOAJ Inject 2.4 mg into the skin once a week for 28 days. (Patient not taking: Reported on 05/05/2022) 3 mL 0   traZODone (DESYREL) 50 MG tablet Take 0.5-1 tablets (25-50 mg total) by mouth at bedtime as needed for sleep. 30 tablet 1   venlafaxine XR (EFFEXOR-XR) 75 MG 24 hr capsule TAKE 1 CAPSULE(75 MG) BY MOUTH DAILY WITH BREAKFAST 90 capsule 0   No current facility-administered medications for this visit.     Musculoskeletal: Strength & Muscle Tone: within normal limits Gait & Station: normal Patient leans: N/A  Psychiatric Specialty Exam: Review of Systems  Constitutional:  Positive for fatigue.  Psychiatric/Behavioral:  Positive for dysphoric mood. The patient is nervous/anxious.   All other systems reviewed and are negative.   Blood pressure (!) 134/91, pulse (!) 108, temperature 97.8 F (36.6 C), temperature source Oral, height '5\' 3"'$  (1.6 m), weight 229 lb 6.4 oz (104.1 kg).Body mass index is 40.64 kg/m.  General Appearance: Casual  Eye Contact:  Fair  Speech:  Clear and Coherent  Volume:  Normal  Mood:  Anxious and Depressed  Affect:  Congruent  Thought Process:  Goal Directed and Descriptions of Associations: Intact  Orientation:  Full (Time, Place, and Person)  Thought Content: Logical   Suicidal Thoughts:  No  Homicidal  Thoughts:  No  Memory:  Immediate;   Fair Recent;   Fair Remote;   Fair   Judgement:  Fair  Insight:  Fair  Psychomotor Activity:  Normal  Concentration:  Concentration: Fair and Attention Span: Fair  Recall:  [employer] of Knowledge: Fair  Language: Fair  Akathisia:  No  Handed:  Right  AIMS (if indicated): not done  Assets:  Communication Skills Desire for Improvement Housing Social Support  ADL's:  Intact  Cognition: WNL  Sleep:  Fair   Screenings: AIMS    Flowsheet Row Video Visit from 10/23/2021 in [facility] Total Score 0      [place] Visit from 05/05/2022 in [facility] Office Visit from 02/10/2022 in [facility] Video Visit from 02/05/2022 in [facility] Video Visit from 11/27/2021 in [facility] Video Visit from 10/23/2021 in [facility]  Total GAD-7 Score '10 1 1 '$ 0 13      PHQ2-9    [place] Visit from 05/05/2022 in [place] [place] Office Visit from 03/25/2022 in [facility] Office Visit from 02/10/2022 in [facility] Video Visit from 02/05/2022 in [facility] Office Visit from 12/15/2021 in [facility]  PHQ-2 Total Score 1 0 0 0 0  PHQ-9 Total Score 7 0 2 0 0      [place] Visit from 05/05/2022 in [facility] Video Visit from 02/05/2022 in [place] Video Visit from 11/27/2021 in [place] No Risk No Risk No Risk        Assessment and Plan: Olivia King is a 37 year old female, widowed, lives in [place], has a history of MDD, GAD was evaluated in office today.  Patient continues to worry about not being able to find a job, currently struggles with fatigue, sadness, anxiety, will benefit from the  following plan.  Plan MDD-unstable Increase venlafaxine extended release 112.5 mg p.o. daily Lamotrigine 25 mg p.o. daily  GAD-unstable Increase venlafaxine as noted above. Continue hydroxyzine 10 mg p.o. nightly as needed Discussed referral for CBT, patient not interested  Insomnia-improving Trazodone 25-50 mg p.o. nightly as needed Continue sleep hygiene techniques.  Tobacco use disorder-unstable Provided counseling for 1 minute.  Patient unable to get Chantix approved.  Not interested in nicotine replacement Provided resources last visit.  Patient to follow up with primary care provider for vitamin C58, folic acid deficiency.  Follow-up in clinic in 1 month or sooner if needed.  This note was generated in part or whole with voice recognition software. Voice recognition is usually quite accurate but there are transcription errors that can and very often do occur. I apologize for any typographical errors that were not detected and corrected.     Ursula Alert, MD 05/06/2022, 9:47 AM

## 2022-05-11 ENCOUNTER — Telehealth: Payer: Self-pay | Admitting: Family Medicine

## 2022-05-11 NOTE — Telephone Encounter (Signed)
Report printed and patient aware ready for pick-up

## 2022-05-11 NOTE — Telephone Encounter (Signed)
Copied from [place] [phone]. Topic: General - Other >> May 11, 2022 12:07 PM Everette C wrote: Reason for CRM: The patient has called to request a copy of their immunization records  Please contact the patient further when the document is available to pick up

## 2022-06-08 ENCOUNTER — Ambulatory Visit: Admitting: Psychiatry

## 2022-06-08 ENCOUNTER — Ambulatory Visit (INDEPENDENT_AMBULATORY_CARE_PROVIDER_SITE_OTHER): Admitting: Psychiatry

## 2022-06-08 ENCOUNTER — Encounter: Payer: Self-pay | Admitting: Psychiatry

## 2022-06-08 VITALS — BP 135/84 | HR 79 | Temp 98.6°F | Ht 63.0 in | Wt 231.8 lb

## 2022-06-08 DIAGNOSIS — G4701 Insomnia due to medical condition: Secondary | ICD-10-CM

## 2022-06-08 DIAGNOSIS — F331 Major depressive disorder, recurrent, moderate: Secondary | ICD-10-CM | POA: Diagnosis not present

## 2022-06-08 DIAGNOSIS — F411 Generalized anxiety disorder: Secondary | ICD-10-CM | POA: Diagnosis not present

## 2022-06-08 DIAGNOSIS — F17200 Nicotine dependence, unspecified, uncomplicated: Secondary | ICD-10-CM

## 2022-06-08 NOTE — Progress Notes (Signed)
[facility] OP Progress Note  06/08/2022 5:17 PM Olivia King  MRN:  [id number]  Chief Complaint:  Chief Complaint  Patient presents with   Follow-up   Anxiety   Medication Refill   HPI: Olivia King is a 37 year old female, currently employed at [employer], lives in [place], widowed, has a history of MDD, GAD, tobacco use disorder, was evaluated in office today.  Patient today reports she is currently trying to get adjusted to her new job.  She reports she enjoys her work and loves being there.  She does have a lot of support at her new job.  Patient however reports lately she has been feeling overwhelmed.  Patient became tearful in session today when she discussed this.  She reports she has difficulty getting out of bed especially during the weekends when she does not have to work.  Last weekend she stayed in bed the whole time.  She reports she has to push herself to get out of bed other days.  She has been physically feeling tired also.  Not sure what is going on.  She does not know if she is depressed or not.  She does not have any significant anxiety about anything however she has been feeling this way.  Unable to elaborate further.  She did not increase the venlafaxine as discussed last visit.  Agreeable to pick up her medication from pharmacy.  Patient has been noncompliant with psychotherapy referrals.  She reports previously she had a therapist that did not go well and hence she is not too sure about reestablishing care with another therapist.  Patient denies any suicidality, homicidality or perceptual disturbances.  Patient reports she is compliant on medications.  Patient reports she sleeps okay at night, has not had any apneic episodes.  Denies any snoring at night.  Reports she quit smoking a few days ago.  Unknown if her current mood symptoms related to her smoking cessation.  She is not interested in nicotine replacement.  Denies any other concerns today.  Visit  Diagnosis:    ICD-10-CM   1. MDD (major depressive disorder), recurrent episode, moderate (HCC)  F33.1     2. GAD (generalized anxiety disorder)  F41.1     3. Insomnia due to medical condition  G47.01    Anxiety    4. Tobacco use disorder  F17.200       Past Psychiatric History: Reviewed past psychiatric history from progress note on 10/10/2019.  Past trials of medications like Lexapro, Vyvanse, Abilify, Paxil, Zoloft, Cymbalta.  Past Medical History:  Past Medical History:  Diagnosis Date   Cigarette smoker    COVID-19 virus detected 03/21/2019   Family history of pancreatic cancer 08/2020   MyRisk neg except SDHA VUS; IBIS=9.2%/riskscore=7.3%   IUD contraception 02/06/2016   Placed October 5, 0000000 by Ardeth Perfect, PA [facility]   Major depressive disorder, recurrent ([place]) 08/19/2015   zoloft made her cry; cymbalta made her too sleepy   Migraine headache     Past Surgical History:  Procedure Laterality Date   CESAREAN SECTION  2004 &2012   contraceptive implant      Family Psychiatric History: Reviewed family psychiatric history from progress note on 10/10/2019.  Family History:  Family History  Problem Relation Age of Onset   Hypertension Mother    Pancreatic cancer Mother 93   Hypertension Brother    Aneurysm Maternal Grandmother    Heart attack Maternal Grandfather    Hypertension Paternal Grandmother    Heart  failure Sister     Social History: Reviewed social history from progress note on 10/10/2019. Social History   Socioeconomic History   Marital status: Widowed    Spouse name: Not on file   Number of children: 2   Years of education: Not on file   Highest education level: Associate degree: occupational, Hotel manager, or vocational program  Occupational History   Not on file  Tobacco Use   Smoking status: Former    Years: 9.00    Types: Cigarettes    Start date: 03/05/2011    Quit date: 06/06/2022   Smokeless tobacco: Never  Vaping Use   Vaping  Use: Never used  Substance and Sexual Activity   Alcohol use: Yes    Alcohol/week: 0.0 standard drinks of alcohol   Drug use: No   Sexual activity: Yes    Partners: Male    Birth control/protection: I.U.D.    Comment: Patient has liletta IUD  Other Topics Concern   Not on file  Social History Narrative   She is a widow, he died 06-29-2009 while he was deployed.    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   Social Determinants of Health   Financial Resource Strain: Low Risk  (09/15/2019)   Overall Financial Resource Strain (CARDIA)    Difficulty of Paying Living Expenses: Not hard at all  Food Insecurity: No Food Insecurity (05/10/2019)   Hunger Vital Sign    Worried About Running Out of Food in the Last Year: Never true    Ran Out of Food in the Last Year: Never true  Transportation Needs: No Transportation Needs (05/10/2019)   PRAPARE - Hydrologist (Medical): No    Lack of Transportation (Non-Medical): No  Physical Activity: Sufficiently Active (09/15/2019)   Exercise Vital Sign    Days of Exercise per Week: 3 days    Minutes of Exercise per Session: 50 min  Stress: No Stress Concern Present (09/15/2019)   [place]    Feeling of Stress : Not at all  Social Connections: Moderately Integrated (09/15/2019)   Social Connection and Isolation Panel [NHANES]    Frequency of Communication with Friends and Family: More than three times a week    Frequency of Social Gatherings with Friends and Family: More than three times a week    Attends Religious Services: More than 4 times per year    Active Member of [employer] or Organizations: Yes    Attends Archivist Meetings: More than 4 times per year    Marital Status: Widowed    Allergies:  Allergies  Allergen Reactions   Sumatriptan Anaphylaxis    Throat and tongue swelling  Throat and tongue swelling  Throat and tongue swelling     Penicillins Rash   Vortioxetine Other (See Comments)    Tingling hands and blurred vision  Tingling hands and blurred vision  Tingling hands and blurred vision    Doxycycline     Unable to take it currently due to intracranial hypertension    Oxybate     Metabolic Disorder Labs: Lab Results  Component Value Date   HGBA1C 5.0 12/15/2021   MPG 97 12/15/2021   MPG 100 05/10/2019   No results found for: "PROLACTIN" Lab Results  Component Value Date   CHOL 164 12/15/2021   TRIG 67 12/15/2021   HDL 45 (L) 12/15/2021   CHOLHDL 3.6 12/15/2021   VLDL 11 03/19/2016   LDLCALC  104 (H) 12/15/2021   LDLCALC 116 (H) 12/17/2020   Lab Results  Component Value Date   TSH 2.49 12/17/2020   TSH 2.17 05/10/2019    Therapeutic Level Labs: No results found for: "LITHIUM" No results found for: "VALPROATE" No results found for: "CBMZ"  Current Medications: Current Outpatient Medications  Medication Sig Dispense Refill   cetirizine (ZYRTEC) 10 MG tablet Take 10 mg by mouth daily.     clindamycin (CLEOCIN T) 1 % lotion Apply topically 2 (two) times daily. 60 mL 2   EPINEPHrine 0.3 mg/0.3 mL IJ SOAJ injection Inject into the muscle.     folic acid (FOLVITE) 1 MG tablet Take 1 mg by mouth daily.     hydrOXYzine (ATARAX) 10 MG tablet Take 1 tablet (10 mg total) by mouth at bedtime as needed. 90 tablet 0   ipratropium (ATROVENT) 0.03 % nasal spray ipratropium bromide 21 mcg (0.03 %) nasal spray     lamoTRIgine (LAMICTAL) 25 MG tablet Take 1 tablet (25 mg total) by mouth daily. 90 tablet 0   metroNIDAZOLE (METROGEL) 0.75 % vaginal gel INSERT 1 APPLICATORFUL VAGINALLY TWICE DAILY 70 g 0   Multiple Vitamin (MULTI-VITAMINS) TABS Take by mouth.     traZODone (DESYREL) 50 MG tablet Take 0.5-1 tablets (25-50 mg total) by mouth at bedtime as needed for sleep. 30 tablet 1   venlafaxine XR (EFFEXOR XR) 37.5 MG 24 hr capsule Take 1 capsule (37.5 mg total) by mouth daily with breakfast. Take along with 75  mg daily , total of 112.5 mg daily 90 capsule 0   venlafaxine XR (EFFEXOR-XR) 75 MG 24 hr capsule TAKE 1 CAPSULE(75 MG) BY MOUTH DAILY WITH BREAKFAST 90 capsule 0   vitamin B-12 (CYANOCOBALAMIN) 500 MCG tablet Take 500 mcg by mouth daily.     Vitamin D, Ergocalciferol, (DRISDOL) 1.25 MG (50000 UNIT) CAPS capsule Take 50,000 Units by mouth once a week.     Semaglutide-Weight Management 1 MG/0.5ML SOAJ Inject 1 mg into the skin once a week for 28 days. (Patient not taking: Reported on 06/08/2022) 2 mL 0   [START ON 06/20/2022] Semaglutide-Weight Management 1.7 MG/0.75ML SOAJ Inject 1.7 mg into the skin once a week for 28 days. (Patient not taking: Reported on 05/05/2022) 3 mL 0   [START ON 07/19/2022] Semaglutide-Weight Management 2.4 MG/0.75ML SOAJ Inject 2.4 mg into the skin once a week for 28 days. (Patient not taking: Reported on 05/05/2022) 3 mL 0   No current facility-administered medications for this visit.     Musculoskeletal: Strength & Muscle Tone: within normal limits Gait & Station: normal Patient leans: N/A  Psychiatric Specialty Exam: Review of Systems  Psychiatric/Behavioral:         Reports she feels " tired' unable to elaborate  All other systems reviewed and are negative.   Blood pressure 135/84, pulse 79, temperature 98.6 F (37 C), temperature source Oral, height 5' 3"$  (1.6 m), weight 231 lb 12.8 oz (105.1 kg).Body mass index is 41.06 kg/m.  General Appearance: Casual  Eye Contact:  Fair  Speech:  Clear and Coherent  Volume:  Normal  Mood:   ' feels tired ' unable to elaborate   Affect:  Tearful  Thought Process:  Goal Directed and Descriptions of Associations: Intact  Orientation:  Full (Time, Place, and Person)  Thought Content: Logical   Suicidal Thoughts:  No  Homicidal Thoughts:  No  Memory:  Immediate;   Fair Recent;   Fair Remote;   Fair  Judgement:  Fair  Insight:  Fair  Psychomotor Activity:  Normal  Concentration:  Concentration: Fair and Attention  Span: Fair  Recall:  [employer] of Knowledge: Fair  Language: Fair  Akathisia:  No  Handed:  Right  AIMS (if indicated): not done  Assets:  Communication Skills Desire for Improvement Housing  ADL's:  Intact  Cognition: WNL  Sleep:  Fair   Screenings: AIMS    Flowsheet Row Video Visit from 10/23/2021 in [facility] Total Score 0      [place] Office Visit from 06/08/2022 in [place] Office Visit from 05/05/2022 in [place] Office Visit from 02/10/2022 in [facility] Video Visit from 02/05/2022 in [place] Video Visit from 11/27/2021 in [place]  Total GAD-7 Score 5 10 1 1 $ 0      PHQ2-9    [place] Office Visit from 06/08/2022 in [facility] Office Visit from 05/05/2022 in [place] Office Visit from 03/25/2022 in [facility] Office Visit from 02/10/2022 in [facility] Video Visit from 02/05/2022 in [place]  PHQ-2 Total Score 2 1 0 0 0  PHQ-9 Total Score 7 7 0 2 0      [place] Office Visit from 06/08/2022 in [place] Office Visit from 05/05/2022 in [place] Video Visit from 02/05/2022 in [place] No Risk No Risk No Risk        Assessment and Plan: Olivia King is a 37 year old female, widowed, lives in [place], has a history of MDD, GAD was evaluated in office today.  Patient is currently at her new job, likely overwhelmed, having adjustment problems, also recently stopped  smoking likely that also contributing to mood symptoms, will benefit from the following plan.  Plan MDD-unstable Increase venlafaxine extended release to 112.5 mg p.o. daily.  Patient did not increase the dosage as discussed last visit on 05/05/2022.  Encouraged her to pick up the prescription from pharmacy for the extra venlafaxine 37.5 mg p.o. daily Continue Lamictal 25 mg p.o. daily.  Could increase the dosage of this medication if needed. Refer for CBT-communicated with staff to schedule this patient with a new therapist.  GAD-unstable Increase venlafaxine as noted above. Hydroxyzine 10 mg nightly as needed Patient encouraged to start CBT.  Insomnia-stable Continue trazodone 25-50 mg p.o. nightly as needed Continue sleep hygiene techniques  Tobacco use disorder-in remission Patient quit smoking a few days ago.  Mood symptoms likely also related to nicotine withdrawal. Discussed nicotine replacement-patient is not interested.  Crisis plan discussed with patient.  Patient to make use of support system.  Patient also advised to call the clinic if she needs any further medication changes.  Communicated with staff who was able to offer the patient a psychotherapy visit with our new therapist tomorrow morning-06/09/2022-patient to check with her work prior to scheduling this.  Patient encouraged to keep psychotherapy visits.  Follow-up in clinic in 3 to 4 weeks or sooner if needed.    This note was generated in part or whole with voice recognition software. Voice recognition is usually quite accurate but there are transcription errors that can and very often do  occur. I apologize for any typographical errors that were not detected and corrected.      Ursula Alert, MD 06/09/2022, 8:35 AM

## 2022-06-29 NOTE — Progress Notes (Deleted)
Name: Olivia King   MRN: [id number]    DOB: 1985/12/15   Date:06/29/2022       Progress Note  Subjective  Chief Complaint  Follow Up  HPI  Lymphedema: she went to vascular surgeon, and was advised to use compression stocking hoses and because of sob advised to see cardiologist. 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 Seen by vascular surgeon   Tachycardia: Dr. Mylo Red gave her metoprolol but she never started, she is doing well now and released from his care Unchanged    Morbid obesity: she states as a teenager she was in the 160 lbs range, but after her son was born in 2004 her weight stayed between 189-203 lbs , had her daughter in 2012 her weight went up to 270 lbs, but after her birth she was able to go back down to 203 lbs. She states in 2016 she took some otc diet pills and was going to the gym and her weight dropped about 40 lbs in a few months, she was back to 165 lbs. She told me back in April 2021 that  she can binge eat. She is able to avoid eating during the day - because she is so busy, but at night when upset with her children or not feeling well she eats to get comfort She states she feels out of control and is unable to stop eating, about twice a week, she feels very guilty afterwards. She states when she overeats she takes magnesium to help her have a bowel movement. She does not make herself vomit We started her on Vyvanse 07/2019 she has noticed that her mood improved and also decrease her urge to eat as much, her weight went up to 243 lbs in August after she had been out of Vyvanse for about 6 months. . She states no longer on phentermine ( got it from weight loss clinic) . She was seen by neurologist and advised to lose weight due to idiopathic intracranial hypertension. She also has BMI over 40 and  pre-diabetes. Weight is trending up, unable to get Wegovy due to nationwide shortage , we will give her samples of ozempic to start and switch to wegovy at higher doses since available at pharmacy   MDD/GAD/eating disorder: she is under the care of Dr. Cherlyn Cushing but not currently seeing a therapist.  She is compliant with medication, no longer on Vyvanse due to cost and did not work for her  , but taking Effexor and Lamictal and  hydroxizine prn. Trazodone prn for sleep   Migraine headache:She is allergic to Imitrex, takes Excedrin migraine or Fioricet. She states migraine is described  As unilateral, right or left side, however this episode is a little different , not associated with aura, and pain is behind her right eye not above. She is seeing neurologist and is weaning self off topamax and is going to go on Diamox   Hydradenitis suppurative: she is now on Metformin and clindamycin lotion and seems to help some. She is seeing Dr. Milford Cage at [facility]. She is currently off doxy due to Idiopathic hypertension, they tried spironolactone but cannot take it since starting on diamox   Idiopathic intracranial Hypertension: diagnosed by neurologist 02/23 due to pulsating tinnitus and abnormal MRI brain, she is switching now from topamax to Diamox.  Low vitamin B12: level of 251 on 05/19/21 done by neurologist . Folic acid also low, we will give her B12 injection today, and resume otc SL 500 mcg daily and continue folic acid daily, we will recheck labs next visit   Patient Active Problem List   Diagnosis Date Noted   Carotid bruit 03/21/2022   Syncope 03/15/2022   Vertigo 02/18/2022   Insomnia 10/23/2021   Hidradenitis suppurativa 10/30/2020   Family history of pancreatic cancer 08/16/2020   MDD (major depressive disorder), recurrent episode, moderate ([place]) 02/13/2020   Tobacco use disorder 02/13/2020   GAD (generalized anxiety disorder) 10/10/2019   Eating disorder 10/10/2019   Lymphedema  04/10/2019   Chronic venous insufficiency 04/10/2019   Bilateral carpal tunnel syndrome 11/04/2018   Morbid obesity ([place]) 06/14/2017   Contraception management 01/06/2016   Low serum vitamin D 12/24/2014   Migraine without aura 12/21/2014    Past Surgical History:  Procedure Laterality Date   CESAREAN SECTION  2004 &2012   contraceptive implant      Family History  Problem Relation Age of Onset   Hypertension Mother    Pancreatic cancer Mother 84   Hypertension Brother    Aneurysm Maternal Grandmother    Heart attack Maternal Grandfather    Hypertension Paternal Grandmother    Heart failure Sister     Social History   Tobacco Use   Smoking status: Former    Years: 9.00    Types: Cigarettes    Start date: 03/05/2011    Quit date: 06/06/2022    Years since quitting: 0.0   Smokeless tobacco: Never  Substance Use Topics   Alcohol use: Yes    Alcohol/week: 0.0 standard drinks of alcohol     Current Outpatient Medications:    cetirizine (ZYRTEC) 10 MG tablet, Take 10 mg by mouth daily., Disp: , Rfl:    clindamycin (CLEOCIN T) 1 % lotion, Apply topically 2 (two) times daily., Disp: 60 mL, Rfl: 2   EPINEPHrine 0.3 mg/0.3 mL IJ SOAJ injection, Inject into the muscle., Disp: , Rfl:    folic acid (FOLVITE) 1 MG tablet, Take 1 mg by mouth daily., Disp: , Rfl:    hydrOXYzine (ATARAX) 10 MG tablet, Take 1 tablet (10 mg total) by mouth at bedtime as needed., Disp: 90 tablet, Rfl: 0   ipratropium (ATROVENT) 0.03 % nasal spray, ipratropium bromide 21 mcg (0.03 %) nasal spray, Disp: , Rfl:    lamoTRIgine (LAMICTAL) 25 MG tablet, Take 1 tablet (25 mg total) by mouth daily., Disp: 90 tablet, Rfl: 0   metroNIDAZOLE (METROGEL) 0.75 % vaginal gel, INSERT 1 APPLICATORFUL VAGINALLY TWICE DAILY, Disp: 70 g, Rfl: 0   Multiple Vitamin (MULTI-VITAMINS) TABS, Take by mouth., Disp: , Rfl:    Semaglutide-Weight Management 1.7 MG/0.75ML SOAJ, Inject 1.7 mg into the skin once a week for 28 days.  (Patient not taking: Reported on 05/05/2022), Disp: 3 mL, Rfl: 0   [START ON 07/19/2022] Semaglutide-Weight Management 2.4 MG/0.75ML SOAJ, Inject 2.4 mg into the skin once a week for 28 days. (Patient not taking: Reported on 05/05/2022), Disp: 3 mL, Rfl: 0   traZODone (DESYREL) 50 MG tablet, Take 0.5-1 tablets (25-50 mg total) by mouth at bedtime as needed for sleep., Disp: 30 tablet, Rfl: 1   venlafaxine XR (EFFEXOR XR) 37.5 MG 24 hr capsule, Take 1 capsule (37.5 mg total) by mouth daily with breakfast. Take along with 75 mg daily , total of 112.5 mg daily, Disp: 90 capsule, Rfl: 0  venlafaxine XR (EFFEXOR-XR) 75 MG 24 hr capsule, TAKE 1 CAPSULE(75 MG) BY MOUTH DAILY WITH BREAKFAST, Disp: 90 capsule, Rfl: 0   vitamin B-12 (CYANOCOBALAMIN) 500 MCG tablet, Take 500 mcg by mouth daily., Disp: , Rfl:    Vitamin D, Ergocalciferol, (DRISDOL) 1.25 MG (50000 UNIT) CAPS capsule, Take 50,000 Units by mouth once a week., Disp: , Rfl:   Allergies  Allergen Reactions   Sumatriptan Anaphylaxis    Throat and tongue swelling  Throat and tongue swelling  Throat and tongue swelling    Penicillins Rash   Vortioxetine Other (See Comments)    Tingling hands and blurred vision  Tingling hands and blurred vision  Tingling hands and blurred vision    Doxycycline     Unable to take it currently due to intracranial hypertension    Oxybate     I personally reviewed active problem list, medication list, allergies, family history, social history, health maintenance with the patient/caregiver today.   ROS  ***  Objective  There were no vitals filed for this visit.  There is no height or weight on file to calculate BMI.  Physical Exam ***  No results found for this or any previous visit (from the past 2160 hour(s)).   PHQ2/9:    06/08/2022    5:07 PM 05/05/2022    2:51 PM 03/25/2022    8:53 AM 02/10/2022    2:17 PM 02/05/2022    1:14 PM  Depression screen PHQ 2/9  Decreased Interest   0 0   Down,  Depressed, Hopeless   0 0   PHQ - 2 Score   0 0   Altered sleeping   0 0   Tired, decreased energy   0 0   Change in appetite   0 2   Feeling bad or failure about yourself    0 0   Trouble concentrating   0 0   Moving slowly or fidgety/restless   0 0   Suicidal thoughts   0 0   PHQ-9 Score   0 2   Difficult doing work/chores          Information is confidential and restricted. Go to Review Flowsheets to unlock data.    phq 9 is {gen pos [id number]   Fall Risk:    03/25/2022    8:53 AM 02/10/2022    2:17 PM 12/15/2021    9:23 AM 06/17/2021    8:02 AM 12/17/2020    8:31 AM  Fall Risk   Falls in the past year? 0 0 0 1 0  Number falls in past yr: 0  0 0 0  Injury with Fall? 0  0 1 0  Risk for fall due to : No Fall Risks No Fall Risks No Fall Risks Impaired balance/gait No Fall Risks  Follow up Falls prevention discussed Falls prevention discussed;Education provided;Falls evaluation completed Falls prevention discussed Falls prevention discussed Falls prevention discussed      Functional Status Survey:      Assessment & Plan  *** There are no diagnoses linked to this encounter.

## 2022-06-30 ENCOUNTER — Ambulatory Visit: Admitting: Family Medicine

## 2022-07-14 ENCOUNTER — Encounter: Payer: Self-pay | Admitting: Psychiatry

## 2022-07-14 ENCOUNTER — Telehealth (INDEPENDENT_AMBULATORY_CARE_PROVIDER_SITE_OTHER): Admitting: Psychiatry

## 2022-07-14 DIAGNOSIS — F3342 Major depressive disorder, recurrent, in full remission: Secondary | ICD-10-CM

## 2022-07-14 DIAGNOSIS — G4701 Insomnia due to medical condition: Secondary | ICD-10-CM

## 2022-07-14 DIAGNOSIS — F411 Generalized anxiety disorder: Secondary | ICD-10-CM

## 2022-07-14 DIAGNOSIS — F17210 Nicotine dependence, cigarettes, uncomplicated: Secondary | ICD-10-CM

## 2022-07-14 DIAGNOSIS — F17200 Nicotine dependence, unspecified, uncomplicated: Secondary | ICD-10-CM

## 2022-07-14 NOTE — Progress Notes (Unsigned)
Virtual Visit via Video Note  I connected with Olivia King on 07/14/22 at  4:30 PM EDT by a video enabled telemedicine application and verified that I am speaking with the correct person using two identifiers.  Location Provider Location : Remote office Patient Location : Car  Participants: Patient , Provider    I discussed the limitations of evaluation and management by telemedicine and the availability of in person appointments. The patient expressed understanding and agreed to proceed.   I discussed the assessment and treatment plan with the patient. The patient was provided an opportunity to ask questions and all were answered. The patient agreed with the plan and demonstrated an understanding of the instructions.   The patient was advised to call back or seek an in-person evaluation if the symptoms worsen or if the condition fails to improve as anticipated.   Olivia King OP Progress Note  07/15/2022 11:10 AM Olivia King  MRN:  Olivia King  Chief Complaint:  Chief Complaint  Patient presents with   Follow-up   Depression   Anxiety   Medication Refill   HPI: Olivia King is a 37 year old female, employed, widowed lives in [place], has a history of MDD, GAD, tobacco use disorder,  was evaluated by telemedicine today.  Patient today reports overall she is currently doing well.  Denies any significant depression symptoms.  Reports work is going well.  She enjoys it.  It is slow paced than what she was used to before.  However she is getting adjusted.  Patient continues to have anxiety symptoms although she is managing it better than before.  Patient reports appetite as fair.  She is trying to lose weight.  Currently on Topamax.  She does have ozempic available however has not used it yet.  Currently going to the gym a few times a week and watching diet.  May have lost 6 pounds in the past several weeks.  Patient has not been able to establish care with therapist.   Reports she has scheduling conflict.  Will have to find a therapist who will be able to work with her during after hours.  Patient is currently compliant on the venlafaxine.  Denies side effects.  Does use the hydroxyzine as needed.  That does help.  Reports she uses the hydroxyzine at bedtime as needed and that does help.  She does have trazodone available however has not used it.  Patient denies any suicidality, homicidality or perceptual disturbances.  Patient denies any other concerns today.  Visit Diagnosis:    ICD-10-CM   1. MDD (major depressive disorder), recurrent, in full remission ([place])  F33.42     2. GAD (generalized anxiety disorder)  F41.1     3. Insomnia due to medical condition  G47.01    anxiety    4. Tobacco use disorder  F17.200       Past Psychiatric History: I have reviewed past psychiatric history from progress note on 10/10/2019.  Past trials of medications like Lexapro, Vyvanse, Abilify, Paxil, Zoloft, Cymbalta.  Past Medical History:  Past Medical History:  Diagnosis Date   Cigarette smoker    COVID-19 virus detected 03/21/2019   Family history of pancreatic cancer 08/2020   MyRisk neg except SDHA VUS; IBIS=9.2%/riskscore=7.3%   IUD contraception 02/06/2016   Placed October 5, 0000000 by Ardeth Perfect, PA [facility]   Major depressive disorder, recurrent ([place]) 08/19/2015   zoloft made her cry; cymbalta made her too sleepy   Migraine headache     Past  Surgical History:  Procedure Laterality Date   CESAREAN SECTION  2002/07/29 &2012   contraceptive implant      Family Psychiatric History: I have reviewed family psychiatric history from progress note on 10/10/2019.  Family History:  Family History  Problem Relation Age of Onset   Hypertension Mother    Pancreatic cancer Mother 7   Hypertension Brother    Aneurysm Maternal Grandmother    Heart attack Maternal Grandfather    Hypertension Paternal Grandmother    Heart failure Sister     Social  History: Reviewed social history from progress note on 10/10/2019. Social History   Socioeconomic History   Marital status: Widowed    Spouse name: Not on file   Number of children: 2   Years of education: Not on file   Highest education level: Associate degree: occupational, Hotel manager, or vocational program  Occupational History   Not on file  Tobacco Use   Smoking status: Former    Years: 9    Types: Cigarettes    Start date: 03/05/2011    Quit date: 06/06/2022    Years since quitting: 0.1   Smokeless tobacco: Never  Vaping Use   Vaping Use: Never used  Substance and Sexual Activity   Alcohol use: Yes    Alcohol/week: 0.0 standard drinks of alcohol   Drug use: No   Sexual activity: Yes    Partners: Male    Birth control/protection: I.U.D.    Comment: Patient has liletta IUD  Other Topics Concern   Not on file  Social History Narrative   She is a widow, he died 07-28-2009 while he was deployed.    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   Social Determinants of Health   Financial Resource Strain: Low Risk  (09/15/2019)   Overall Financial Resource Strain (CARDIA)    Difficulty of Paying Living Expenses: Not hard at all  Food Insecurity: No Food Insecurity (05/10/2019)   Hunger Vital Sign    Worried About Running Out of Food in the Last Year: Never true    Ran Out of Food in the Last Year: Never true  Transportation Needs: No Transportation Needs (05/10/2019)   PRAPARE - Hydrologist (Medical): No    Lack of Transportation (Non-Medical): No  Physical Activity: Sufficiently Active (09/15/2019)   Exercise Vital Sign    Days of Exercise per Week: 3 days    Minutes of Exercise per Session: 50 min  Stress: No Stress Concern Present (09/15/2019)   [place]    Feeling of Stress : Not at all  Social Connections: Moderately Integrated (09/15/2019)   Social Connection and Isolation Panel  [NHANES]    Frequency of Communication with Friends and Family: More than three times a week    Frequency of Social Gatherings with Friends and Family: More than three times a week    Attends Religious Services: More than 4 times per year    Active Member of [employer] or Organizations: Yes    Attends Archivist Meetings: More than 4 times per year    Marital Status: Widowed    Allergies:  Allergies  Allergen Reactions   Sumatriptan Anaphylaxis    Throat and tongue swelling  Throat and tongue swelling  Throat and tongue swelling    Penicillins Rash   Vortioxetine Other (See Comments)    Tingling hands and blurred vision  Tingling hands and blurred vision  Tingling hands and  blurred vision    Doxycycline     Unable to take it currently due to intracranial hypertension    Oxybate     Metabolic Disorder Labs: Lab Results  Component Value Date   HGBA1C 5.0 12/15/2021   MPG 97 12/15/2021   MPG 100 05/10/2019   No results found for: "PROLACTIN" Lab Results  Component Value Date   CHOL 164 12/15/2021   TRIG 67 12/15/2021   HDL 45 (L) 12/15/2021   CHOLHDL 3.6 12/15/2021   VLDL 11 03/19/2016   LDLCALC 104 (H) 12/15/2021   LDLCALC 116 (H) 12/17/2020   Lab Results  Component Value Date   TSH 2.49 12/17/2020   TSH 2.17 05/10/2019    Therapeutic Level Labs: No results found for: "LITHIUM" No results found for: "VALPROATE" No results found for: "CBMZ"  Current Medications: Current Outpatient Medications  Medication Sig Dispense Refill   cetirizine (ZYRTEC) 10 MG tablet Take 10 mg by mouth daily.     clindamycin (CLEOCIN T) 1 % lotion Apply topically 2 (two) times daily. 60 mL 2   EPINEPHrine 0.3 mg/0.3 mL IJ SOAJ injection Inject into the muscle.     folic acid (FOLVITE) 1 MG tablet Take 1 mg by mouth daily.     hydrOXYzine (ATARAX) 10 MG tablet Take 1 tablet (10 mg total) by mouth at bedtime as needed. 90 tablet 0   ipratropium (ATROVENT) 0.03 % nasal spray  ipratropium bromide 21 mcg (0.03 %) nasal spray     lamoTRIgine (LAMICTAL) 25 MG tablet Take 1 tablet (25 mg total) by mouth daily. 90 tablet 0   metroNIDAZOLE (METROGEL) 0.75 % vaginal gel INSERT 1 APPLICATORFUL VAGINALLY TWICE DAILY 70 g 0   Multiple Vitamin (MULTI-VITAMINS) TABS Take by mouth.     topiramate (TOPAMAX) 50 MG tablet Take 50 mg by mouth 3 (three) times daily.     venlafaxine XR (EFFEXOR XR) 37.5 MG 24 hr capsule Take 1 capsule (37.5 mg total) by mouth daily with breakfast. Take along with 75 mg daily , total of 112.5 mg daily 90 capsule 0   venlafaxine XR (EFFEXOR-XR) 75 MG 24 hr capsule TAKE 1 CAPSULE(75 MG) BY MOUTH DAILY WITH BREAKFAST 90 capsule 0   vitamin B-12 (CYANOCOBALAMIN) 500 MCG tablet Take 500 mcg by mouth daily.     Vitamin D, Ergocalciferol, (DRISDOL) 1.25 MG (50000 UNIT) CAPS capsule Take 50,000 Units by mouth once a week.     Semaglutide-Weight Management 1.7 MG/0.75ML SOAJ Inject 1.7 mg into the skin once a week for 28 days. (Patient not taking: Reported on 07/14/2022) 3 mL 0   [START ON 07/19/2022] Semaglutide-Weight Management 2.4 MG/0.75ML SOAJ Inject 2.4 mg into the skin once a week for 28 days. (Patient not taking: Reported on 05/05/2022) 3 mL 0   traZODone (DESYREL) 50 MG tablet Take 0.5-1 tablets (25-50 mg total) by mouth at bedtime as needed for sleep. (Patient not taking: Reported on 07/14/2022) 30 tablet 1   No current facility-administered medications for this visit.     Musculoskeletal: Strength & Muscle Tone:  UTA Gait & Station:  Seated Patient leans: N/A  Psychiatric Specialty Exam: Review of Systems  Psychiatric/Behavioral:  Positive for sleep disturbance. The patient is nervous/anxious.   All other systems reviewed and are negative.   There were no vitals taken for this visit.There is no height or weight on file to calculate BMI.  General Appearance: Casual  Eye Contact:  Fair  Speech:  Clear and Coherent  Volume:  Normal  Mood:  Anxious  improving  Affect:  Appropriate  Thought Process:  Goal Directed and Descriptions of Associations: Intact  Orientation:  Full (Time, Place, and Person)  Thought Content: Logical   Suicidal Thoughts:  No  Homicidal Thoughts:  No  Memory:  Immediate;   Fair Recent;   Fair Remote;   Fair  Judgement:  Fair  Insight:  Fair  Psychomotor Activity:  Normal  Concentration:  Concentration: Good and Attention Span: Fair  Recall:  [employer] of Knowledge: Fair  Language: Fair  Akathisia:  No  Handed:  Right  AIMS (if indicated): not done  Assets:  Communication Skills Desire for Improvement Housing Social Support  ADL's:  Intact  Cognition: WNL  Sleep:   Restless at times   Screenings: [place] Video Visit from 10/23/2021 in [place] Total Score 0      [place] Office Visit from 06/08/2022 in [place] Office Visit from 05/05/2022 in [place] Office Visit from 02/10/2022 in [facility] Video Visit from 02/05/2022 in [place] Video Visit from 11/27/2021 in [place]  Total GAD-7 Score 5 10 1 1  0      PHQ2-9    [place] Office Visit from 06/08/2022 in [place] Office Visit from 05/05/2022 in [place] Office Visit from 03/25/2022 in [facility] Office Visit from 02/10/2022 in [facility] Video Visit from 02/05/2022 in [place]  PHQ-2 Total Score 2 1 0 0 0  PHQ-9 Total Score 7 7 0 2 0      [place] Office Visit from 06/08/2022 in [place] Office Visit from 05/05/2022 in [place] Video Visit from 02/05/2022 in [place] No Risk No Risk No Risk        Assessment and Plan: Olivia King is a 37 year old female, widowed, lives in [place], has a history of MDD, GAD was evaluated by telemedicine today.  Patient is currently improving.  Plan as noted below.  Plan MDD in remission Venlafaxine extended release 112.5 mg p.o. daily Lamictal 25 mg p.o. daily. Patient was referred for CBT-noncompliant.  GAD-improving Venlafaxine extended release 112.5 mg p.o. daily Hydroxyzine 10 mg p.o. nightly as needed Referred for CBT-pending  Insomnia-restless at times Patient does have trazodone 25-50 mg p.o. nightly as needed available.  Encouraged compliance. Continue sleep hygiene techniques   Follow-up in clinic in 1 month or sooner if needed. Collaboration of Care: Collaboration of Care: Referral or follow-up with counselor/therapist AEB encouraged to establish care with therapist.  Patient/Guardian was advised Release of Information must be obtained prior to any record release in order to collaborate their care with an outside provider. Patient/Guardian was advised if they have not already done so to contact the registration department to sign all necessary forms in order for [place] to release information regarding their care.   Consent: Patient/Guardian gives verbal consent for treatment and assignment of benefits for services provided during this visit. Patient/Guardian expressed understanding and agreed to proceed.   This note was generated in part or whole with voice recognition software. Voice recognition is usually quite accurate  but there are transcription errors that can and very often do occur. I apologize for any typographical errors that were not detected and corrected.    Ursula Alert, King 07/15/2022, 11:10 AM

## 2022-07-31 ENCOUNTER — Other Ambulatory Visit: Payer: Self-pay | Admitting: Family Medicine

## 2022-07-31 NOTE — Telephone Encounter (Signed)
Lvm for pt to call and schedule her routine fu

## 2022-07-31 NOTE — Telephone Encounter (Signed)
Requested medication (s) are due for refill today - provider review   Requested medication (s) are on the active medication list -yes  Future visit scheduled -no  Last refill: 04/08/22 70g  Notes to clinic: off protocol- provider review   Requested Prescriptions  Pending Prescriptions Disp Refills   metroNIDAZOLE (METROGEL) 0.75 % vaginal gel [Pharmacy Med Name: METRONIDAZOLE 0.75% VAGINAL GEL 70G] 70 g 0    Sig: INSERT 1 APPLICATORFUL VAGINALLY TWICE DAILY     Off-Protocol Failed - 07/31/2022 11:24 AM      Failed - Medication not assigned to a protocol, review manually.      Passed - Valid encounter within last 12 months    Recent Outpatient Visits           4 months ago Idiopathic intracranial hypertension   [place] [facility] Alba Cory, MD   5 months ago Tinnitus of right ear   [facility]ba Cory, MD   7 months ago Idiopathic intracranial hypertension   [facility] Alba Cory, MD   1 year ago Tinnitus of right ear   [place] [facility] Alba Cory, MD   1 year ago Morbid obesity [facility])   [place] [facility] Alba Cory, MD                 Requested Prescriptions  Pending Prescriptions Disp Refills   metroNIDAZOLE (METROGEL) 0.75 % vaginal gel [Pharmacy Med Name: METRONIDAZOLE 0.75% VAGINAL GEL 70G] 70 g 0    Sig: INSERT 1 APPLICATORFUL VAGINALLY TWICE DAILY     Off-Protocol Failed - 07/31/2022 11:24 AM      Failed - Medication not assigned to a protocol, review manually.      Passed - Valid encounter within last 12 months    Recent Outpatient Visits           4 months ago Idiopathic intracranial hypertension   [place] [facility] Alba Cory, MD   5 months ago Tinnitus of right ear   [facility] Alba Cory, MD   7 months ago Idiopathic intracranial hypertension    [facility] Alba Cory, MD   1 year ago Tinnitus of right ear   [facility] Alba Cory, MD   1 year ago Morbid obesity [facility])   [facility] [place] Alba Cory, MD

## 2022-08-17 ENCOUNTER — Encounter: Payer: Self-pay | Admitting: Psychiatry

## 2022-08-17 ENCOUNTER — Telehealth (INDEPENDENT_AMBULATORY_CARE_PROVIDER_SITE_OTHER): Admitting: Psychiatry

## 2022-08-17 DIAGNOSIS — F411 Generalized anxiety disorder: Secondary | ICD-10-CM | POA: Diagnosis not present

## 2022-08-17 DIAGNOSIS — G4701 Insomnia due to medical condition: Secondary | ICD-10-CM | POA: Diagnosis not present

## 2022-08-17 DIAGNOSIS — F330 Major depressive disorder, recurrent, mild: Secondary | ICD-10-CM

## 2022-08-17 MED ORDER — VENLAFAXINE HCL ER 75 MG PO CP24: ORAL_CAPSULE | ORAL | 0 refills | Status: DC

## 2022-08-17 MED ORDER — LAMOTRIGINE 25 MG PO TABS: ORAL_TABLET | ORAL | 0 refills | Status: DC

## 2022-08-17 MED ORDER — VENLAFAXINE HCL ER 37.5 MG PO CP24: 37.5000 mg | ORAL_CAPSULE | Freq: Every day | ORAL | 0 refills | Status: DC

## 2022-08-17 NOTE — Progress Notes (Unsigned)
Virtual Visit via Video Note  I connected with Olivia King on 08/17/22 at  4:20 PM EDT by a video enabled telemedicine application and verified that I am speaking with the correct person using two identifiers.  Location Provider Location : [facility] Patient Location : Car  Participants: Patient , Provider   I discussed the limitations of evaluation and management by telemedicine and the availability of in person appointments. The patient expressed understanding and agreed to proceed.   I discussed the assessment and treatment plan with the patient. The patient was provided an opportunity to ask questions and all were answered. The patient agreed with the plan and demonstrated an understanding of the instructions.   The patient was advised to call back or seek an in-person evaluation if the symptoms worsen or if the condition fails to improve as anticipated.  BH MD OP Progress Note  08/18/2022 11:00 AM Nikcole Eischeid  MRN:  [id number]  Chief Complaint:  Chief Complaint  Patient presents with   Follow-up   Anxiety   Depression   Medication Refill   HPI: Olivia King is a 37 year old female, employed, widowed, lives in [place], has a history of MDD, GAD, tobacco use disorder was evaluated by telemedicine today.  Patient today reports she is currently feeling sick since this morning.  She reports she has been nauseous and tired.  She reports she just got out of her work and is planning to go home and rest.  Agrees to talk to her primary provider if needed.  Patient reports lately she has noticed her mood to be irritable.  She does have mood swings.  She does not know what could be contributing to it.  She likes her new job.  She is getting used to be.  Patient is currently on medications like Lamictal, venlafaxine.  Reports she is compliant.  Patient reports sleep is overall okay.  She has not tried the trazodone at since she has not had a chance to use it during the weekend.   She stays busy since her daughter has a lot going on activity wise on weekends.  Patient denies any suicidality, homicidality or perceptual disturbances.  Patient also has not been able to establish care with a therapist yet.  The patient denies any other concerns today.  Visit Diagnosis:    ICD-10-CM   1. MDD (major depressive disorder), recurrent episode, mild (HCC)  F33.0     2. GAD (generalized anxiety disorder)  F41.1 lamoTRIgine (LAMICTAL) 25 MG tablet    venlafaxine XR (EFFEXOR XR) 37.5 MG 24 hr capsule    venlafaxine XR (EFFEXOR-XR) 75 MG 24 hr capsule    3. Insomnia due to medical condition  G47.01    Anxiety      Past Psychiatric History: I have reviewed past psychiatric history from progress note on 10/10/2019.  Past trials of medications like Lexapro, Vyvanse, Abilify, Paxil, Zoloft, Cymbalta.  Past Medical History:  Past Medical History:  Diagnosis Date   Cigarette smoker    COVID-19 virus detected 03/21/2019   Family history of pancreatic cancer 08/2020   MyRisk neg except SDHA VUS; IBIS=9.2%/riskscore=7.3%   IUD contraception 02/06/2016   Placed January 23, 2016 by Althea Grimmer, PA [facility]   Major depressive disorder, recurrent (HCC) 08/19/2015   zoloft made her cry; cymbalta made her too sleepy   Migraine headache     Past Surgical History:  Procedure Laterality Date   CESAREAN SECTION  2004 &2012   contraceptive implant  Family Psychiatric History: Reviewed family psychiatric history from progress note on 10/10/2019.  Family History:  Family History  Problem Relation Age of Onset   Hypertension Mother    Pancreatic cancer Mother 68   Hypertension Brother    Aneurysm Maternal Grandmother    Heart attack Maternal Grandfather    Hypertension Paternal Grandmother    Heart failure Sister     Social History: Reviewed social history from progress note on 10/10/2019. Social History   Socioeconomic History   Marital status: Widowed     Spouse name: Not on file   Number of children: 2   Years of education: Not on file   Highest education level: Associate degree: occupational, Scientist, product/process development, or vocational program  Occupational History   Not on file  Tobacco Use   Smoking status: Former    Years: 9    Types: Cigarettes    Start date: 03/05/2011    Quit date: 06/06/2022    Years since quitting: 0.2   Smokeless tobacco: Never  Vaping Use   Vaping Use: Never used  Substance and Sexual Activity   Alcohol use: Yes    Alcohol/week: 0.0 standard drinks of alcohol   Drug use: No   Sexual activity: Yes    Partners: Male    Birth control/protection: I.U.D.    Comment: Patient has liletta IUD  Other Topics Concern   Not on file  Social History Narrative   She is a widow, he died 09-16-2009 while he was deployed.    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  Social Determinants of Health   Financial Resource Strain: Low Risk  (09/15/2019)   Overall Financial Resource Strain (CARDIA)    Difficulty of Paying Living Expenses: Not hard at all  Food Insecurity: No Food Insecurity (05/10/2019)   Hunger Vital Sign    Worried About Running Out of Food in the Last Year: Never true    Ran Out of Food in the Last Year: Never true  Transportation Needs: No Transportation Needs (05/10/2019)   PRAPARE - Administrator, Civil Service (Medical): No    Lack of Transportation (Non-Medical): No  Physical Activity: Sufficiently Active (09/15/2019)   Exercise Vital Sign    Days of Exercise per Week: 3 days    Minutes of Exercise per Session: 50 min  Stress: No Stress Concern Present (09/15/2019)   [employer] of Occupational Health - Occupational Stress Questionnaire    Feeling of Stress : Not at all  Social Connections: Moderately Integrated (09/15/2019)   Social Connection and Isolation Panel [NHANES]    Frequency of Communication with Friends and Family: More than three times a week    Frequency of Social Gatherings with Friends  and Family: More than three times a week    Attends Religious Services: More than 4 times per year    Active Member of [employer] or Organizations: Yes    Attends Banker Meetings: More than 4 times per year    Marital Status: Widowed    Allergies:  Allergies  Allergen Reactions   Sumatriptan Anaphylaxis    Throat and tongue swelling  Throat and tongue swelling  Throat and tongue swelling    Penicillins Rash   Vortioxetine Other (See Comments)    Tingling hands and blurred vision  Tingling hands and blurred vision  Tingling hands and blurred vision    Doxycycline     Unable to take it currently due to intracranial hypertension    Oxybate  Metabolic Disorder Labs: Lab Results  Component Value Date   HGBA1C 5.0 12/15/2021   MPG 97 12/15/2021   MPG 100 05/10/2019   No results found for: "PROLACTIN" Lab Results  Component Value Date   CHOL 164 12/15/2021   TRIG 67 12/15/2021   HDL 45 (L) 12/15/2021   CHOLHDL 3.6 12/15/2021   VLDL 11 03/19/2016   LDLCALC 104 (H) 12/15/2021   LDLCALC 116 (H) 12/17/2020   Lab Results  Component Value Date   TSH 2.49 12/17/2020   TSH 2.17 05/10/2019    Therapeutic Level Labs: No results found for: "LITHIUM" No results found for: "VALPROATE" No results found for: "CBMZ"  Current Medications: Current Outpatient Medications  Medication Sig Dispense Refill   cetirizine (ZYRTEC) 10 MG tablet Take 10 mg by mouth daily.     clindamycin (CLEOCIN T) 1 % lotion Apply topically 2 (two) times daily. 60 mL 2   EPINEPHrine 0.3 mg/0.3 mL IJ SOAJ injection Inject into the muscle.     folic acid (FOLVITE) 1 MG tablet Take 1 mg by mouth daily.     hydrOXYzine (ATARAX) 10 MG tablet Take 1 tablet (10 mg total) by mouth at bedtime as needed. 90 tablet 0   ipratropium (ATROVENT) 0.03 % nasal spray ipratropium bromide 21 mcg (0.03 %) nasal spray     metroNIDAZOLE (METROGEL) 0.75 % vaginal gel INSERT 1 APPLICATORFUL VAGINALLY TWICE DAILY 70 g  0   Multiple Vitamin (MULTI-VITAMINS) TABS Take by mouth.     topiramate (TOPAMAX) 50 MG tablet Take 50 mg by mouth 3 (three) times daily.     traZODone (DESYREL) 50 MG tablet Take 0.5-1 tablets (25-50 mg total) by mouth at bedtime as needed for sleep. 30 tablet 1   vitamin B-12 (CYANOCOBALAMIN) 500 MCG tablet Take 500 mcg by mouth daily.     Vitamin D, Ergocalciferol, (DRISDOL) 1.25 MG (50000 UNIT) CAPS capsule Take 50,000 Units by mouth once a week.     lamoTRIgine (LAMICTAL) 25 MG tablet Take 1 tablet (25 mg total) by mouth 2 (two) times daily for 15 days, THEN 3 tablets (75 mg total) daily for 15 days. 75 tablet 0   venlafaxine XR (EFFEXOR XR) 37.5 MG 24 hr capsule Take 1 capsule (37.5 mg total) by mouth daily with breakfast. Take along with 75 mg daily , total of 112.5 mg daily 90 capsule 0   venlafaxine XR (EFFEXOR-XR) 75 MG 24 hr capsule TAKE 1 CAPSULE(75 MG) BY MOUTH DAILY WITH BREAKFAST 90 capsule 0   No current facility-administered medications for this visit.     Musculoskeletal: Strength & Muscle Tone:  UTA Gait & Station:  Seated Patient leans: N/A  Psychiatric Specialty Exam: Review of Systems  Gastrointestinal:  Positive for nausea.  Psychiatric/Behavioral:         Irritable, mood swings  All other systems reviewed and are negative.   There were no vitals taken for this visit.There is no height or weight on file to calculate BMI.  General Appearance: Casual  Eye Contact:  Fair  Speech:  Clear and Coherent  Volume:  Normal  Mood:  Irritable  Affect:  Appropriate  Thought Process:  Goal Directed and Descriptions of Associations: Intact  Orientation:  Full (Time, Place, and Person)  Thought Content: Logical   Suicidal Thoughts:  No  Homicidal Thoughts:  No  Memory:  Immediate;   Fair Recent;   Fair Remote;   Fair  Judgement:  Fair  Insight:  Fair  Psychomotor Activity:  Normal  Concentration:  Concentration: Fair and Attention Span: Fair  Recall:  [employer]  of Knowledge: Fair  Language: Fair  Akathisia:  No  Handed:  Right  AIMS (if indicated): not done  Assets:  Communication Skills Desire for Improvement Housing Social Support  ADL's:  Intact  Cognition: WNL  Sleep:  Fair   Screenings: AIMS    Flowsheet Row Video Visit from 10/23/2021 in [facility]  AIMS Total Score 0      GAD-7    Flowsheet Row Office Visit from 06/08/2022 in [facility] Office Visit from 05/05/2022 in [facility] Office Visit from 02/10/2022 in [facility] Video Visit from 02/05/2022 in [facility] Video Visit from 11/27/2021 in [facility]  Total GAD-7 Score 5 10 1 1  0      PHQ2-9    Flowsheet Row Office Visit from 06/08/2022 in [facility] Office Visit from 05/05/2022 in [facility] Office Visit from 03/25/2022 in [facility] Office Visit from 02/10/2022 in [facility] Video Visit from 02/05/2022 in [facility]  PHQ-2 Total Score 2 1 0 0 0  PHQ-9 Total Score 7 7 0 2 0      Flowsheet Row Video Visit from 08/17/2022 in [facility] Office Visit from 06/08/2022 in [facility] Office Visit from 05/05/2022 in [facility]  C-SSRS RISK CATEGORY No Risk No Risk No Risk        Assessment and Plan: Riyah Bardon is a 37 year old female, widowed, lives in [place], has a history of MDD, GAD was evaluated by telemedicine today.  Patient is currently struggling with irritability, will benefit from dose readjustment of her Lamictal, will benefit from  following plan.  Plan MDD -unstable Venlafaxine extended release 112.5 mg p.o. daily Increase lamotrigine to 25 mg p.o. twice daily for 2 weeks and increase to lamotrigine 75 mg p.o. daily in divided dosage after that. Patient was referred for CBT in the past-pending  GAD-unstable Venlafaxine extended release 112.5 mg p.o. daily Hydroxyzine 10 mg p.o. nightly as needed Patient was referred for CBT - noncompliant.  Encouraged compliance.  Insomnia-improving Trazodone 25-50 mg p.o. nightly as needed-encouraged compliance.  Patient to use it as needed only.   Follow-up in clinic in 1 month or sooner if needed.  Collaboration of Care: Collaboration of Care: Other patient with no nausea, advised to follow up with primary care provider.  Patient/Guardian was advised Release of Information must be obtained prior to any record release in order to collaborate their care with an outside provider. Patient/Guardian was advised if they have not already done so to contact the registration department to sign all necessary forms in order for [place] to release information regarding their care.   Consent: Patient/Guardian gives verbal consent for treatment and assignment of benefits for services provided during this visit. Patient/Guardian expressed understanding and agreed to proceed.   This note was generated in part or whole with voice recognition software. Voice recognition is usually quite accurate but there are transcription errors that can and very often do occur. I apologize for any typographical errors that were not detected and corrected.    Jomarie Longs, MD 08/18/2022, 11:00 AM

## 2022-08-21 ENCOUNTER — Encounter: Payer: Self-pay | Admitting: Internal Medicine

## 2022-08-21 ENCOUNTER — Ambulatory Visit (INDEPENDENT_AMBULATORY_CARE_PROVIDER_SITE_OTHER): Admitting: Internal Medicine

## 2022-08-23 NOTE — Progress Notes (Signed)
Created in error- please disregard 

## 2022-09-17 ENCOUNTER — Telehealth: Admitting: Psychiatry

## 2022-09-17 ENCOUNTER — Encounter: Payer: Self-pay | Admitting: Psychiatry

## 2022-09-17 DIAGNOSIS — F3342 Major depressive disorder, recurrent, in full remission: Secondary | ICD-10-CM

## 2022-09-17 DIAGNOSIS — G4701 Insomnia due to medical condition: Secondary | ICD-10-CM

## 2022-09-17 DIAGNOSIS — F411 Generalized anxiety disorder: Secondary | ICD-10-CM

## 2022-09-17 MED ORDER — LAMOTRIGINE 25 MG PO TABS
75.0000 mg | ORAL_TABLET | Freq: Every day | ORAL | 0 refills | Status: DC
Start: 2022-09-17 — End: 2022-12-15

## 2022-09-17 MED ORDER — HYDROXYZINE HCL 10 MG PO TABS
10.0000 mg | ORAL_TABLET | Freq: Every evening | ORAL | 0 refills | Status: DC | PRN
Start: 2022-09-17 — End: 2022-12-15

## 2022-09-17 NOTE — Progress Notes (Signed)
Virtual Visit via Video Note  I connected with Olivia King on 09/17/22 at  8:30 AM EDT by a video enabled telemedicine application and verified that I am speaking with the correct person using two identifiers.  Location Provider Location : Remote office Patient Location : Work  Participants: Patient , Provider    I discussed the limitations of evaluation and management by telemedicine and the availability of in person appointments. The patient expressed understanding and agreed to proceed.    I discussed the assessment and treatment plan with the patient. The patient was provided an opportunity to ask questions and all were answered. The patient agreed with the plan and demonstrated an understanding of the instructions.   The patient was advised to call back or seek an in-person evaluation if the symptoms worsen or if the condition fails to improve as anticipated.   BH MD OP Progress Note  09/17/2022 11:26 AM Olivia King  MRN:  [id number]  Chief Complaint:  Chief Complaint  Patient presents with   Follow-up   Depression   Anxiety   Medication Refill   HPI: Olivia King is a 37 year old female, in [place], has a history of MDD, GAD, tobacco use disorder was provided needed by telemedicine today.  Patient today reports she is currently tolerating the higher dosage of lamotrigine.  She denies any significant sadness, anhedonia, fatigue.  She also reports anxiety symptoms as manageable.  Patient reports she is sleeping better.  Denies any concerns.  Reports she uses the trazodone as needed and that helps.  Patient denies any suicidality, homicidality or perceptual disturbances.  Patient reports work is going well.  She has been unable to find a psychotherapist however is motivated to get established.  Patient denies any other concerns today.  Visit Diagnosis:    ICD-10-CM   1. MDD (major depressive disorder), recurrent, in full remission (HCC)  F33.42  lamoTRIgine (LAMICTAL) 25 MG tablet    2. GAD (generalized anxiety disorder)  F41.1 hydrOXYzine (ATARAX) 10 MG tablet    3. Insomnia due to medical condition  G47.01    Anxiety      Past Psychiatric History: I have reviewed past psychiatric history from progress note on 10/10/2019.  Past trials of medications like Lexapro, Vyvanse, Abilify, Paxil, Zoloft, Cymbalta.  Past Medical History:  Past Medical History:  Diagnosis Date   Cigarette smoker    COVID-19 virus detected 03/21/2019   Family history of pancreatic cancer 08/2020   MyRisk neg except SDHA VUS; IBIS=9.2%/riskscore=7.3%   IUD contraception 02/06/2016   Placed January 23, 2016 by Althea Grimmer, PA [facility]   Major depressive disorder, recurrent (HCC) 08/19/2015   zoloft made her cry; cymbalta made her too sleepy   Migraine headache     Past Surgical History:  Procedure Laterality Date   CESAREAN SECTION  2004 &2012   contraceptive implant      Family Psychiatric History: I have reviewed family psychiatric history from progress note on 10/10/2019.  Family History:  Family History  Problem Relation Age of Onset   Hypertension Mother    Pancreatic cancer Mother 12   Hypertension Brother    Aneurysm Maternal Grandmother    Heart attack Maternal Grandfather    Hypertension Paternal Grandmother    Heart failure Sister     Social History: I have reviewed social history from progress note on 10/10/2019. Social History   Socioeconomic History   Marital status: Widowed    Spouse name: Not on file   Number of  children: 2   Years of education: Not on file   Highest education level: Associate degree: occupational, technical, or vocational program  Occupational History   Not on file  Tobacco Use   Smoking status: Former    Years: 9    Types: Cigarettes    Start date: 03/05/2011    Quit date: 06/06/2022    Years since quitting: 0.2   Smokeless tobacco: Never  Vaping Use   Vaping Use: Never used  Substance  and Sexual Activity   Alcohol use: Yes    Alcohol/week: 0.0 standard drinks of alcohol   Drug use: No   Sexual activity: Yes    Partners: Male    Birth control/protection: I.U.D.    Comment: Patient has liletta IUD  Other Topics Concern   Not on file  Social History Narrative   She is a widow, he died 2009-10-17 while he was deployed.    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   Social Determinants of Health   Financial Resource Strain: Low Risk  (09/15/2019)   Overall Financial Resource Strain (CARDIA)    Difficulty of Paying Living Expenses: Not hard at all  Food Insecurity: No Food Insecurity (05/10/2019)   Hunger Vital Sign    Worried About Running Out of Food in the Last Year: Never true    Ran Out of Food in the Last Year: Never true  Transportation Needs: No Transportation Needs (05/10/2019)   PRAPARE - Administrator, Civil Service (Medical): No    Lack of Transportation (Non-Medical): No  Physical Activity: Sufficiently Active (09/15/2019)   Exercise Vital Sign    Days of Exercise per Week: 3 days    Minutes of Exercise per Session: 50 min  Stress: No Stress Concern Present (09/15/2019)   [employer] of Occupational Health - Occupational Stress Questionnaire    Feeling of Stress : Not at all  Social Connections: Moderately Integrated (09/15/2019)   Social Connection and Isolation Panel [NHANES]    Frequency of Communication with Friends and Family: More than three times a week    Frequency of Social Gatherings with Friends and Family: More than three times a week    Attends Religious Services: More than 4 times per year    Active Member of [employer] or Organizations: Yes    Attends Banker Meetings: More than 4 times per year    Marital Status: Widowed    Allergies:  Allergies  Allergen Reactions   Sumatriptan Anaphylaxis    Throat and tongue swelling  Throat and tongue swelling  Throat and tongue swelling    Penicillins Rash   Vortioxetine  Other (See Comments)    Tingling hands and blurred vision  Tingling hands and blurred vision  Tingling hands and blurred vision    Doxycycline     Unable to take it currently due to intracranial hypertension    Oxybate     Metabolic Disorder Labs: Lab Results  Component Value Date   HGBA1C 5.0 12/15/2021   MPG 97 12/15/2021   MPG 100 05/10/2019   No results found for: "PROLACTIN" Lab Results  Component Value Date   CHOL 164 12/15/2021   TRIG 67 12/15/2021   HDL 45 (L) 12/15/2021   CHOLHDL 3.6 12/15/2021   VLDL 11 03/19/2016   LDLCALC 104 (H) 12/15/2021   LDLCALC 116 (H) 12/17/2020   Lab Results  Component Value Date   TSH 2.49 12/17/2020   TSH 2.17 05/10/2019    Therapeutic Level  Labs: No results found for: "LITHIUM" No results found for: "VALPROATE" No results found for: "CBMZ"  Current Medications: Current Outpatient Medications  Medication Sig Dispense Refill   lamoTRIgine (LAMICTAL) 25 MG tablet Take 3 tablets (75 mg total) by mouth daily. 270 tablet 0   cetirizine (ZYRTEC) 10 MG tablet Take 10 mg by mouth daily.     clindamycin (CLEOCIN T) 1 % lotion Apply topically 2 (two) times daily. 60 mL 2   EPINEPHrine 0.3 mg/0.3 mL IJ SOAJ injection Inject into the muscle.     folic acid (FOLVITE) 1 MG tablet Take 1 mg by mouth daily.     hydrOXYzine (ATARAX) 10 MG tablet Take 1 tablet (10 mg total) by mouth at bedtime as needed. 90 tablet 0   ipratropium (ATROVENT) 0.03 % nasal spray ipratropium bromide 21 mcg (0.03 %) nasal spray     metroNIDAZOLE (METROGEL) 0.75 % vaginal gel INSERT 1 APPLICATORFUL VAGINALLY TWICE DAILY 70 g 0   Multiple Vitamin (MULTI-VITAMINS) TABS Take by mouth.     topiramate (TOPAMAX) 50 MG tablet Take 50 mg by mouth 3 (three) times daily.     traZODone (DESYREL) 50 MG tablet Take 0.5-1 tablets (25-50 mg total) by mouth at bedtime as needed for sleep. 30 tablet 1   venlafaxine XR (EFFEXOR XR) 37.5 MG 24 hr capsule Take 1 capsule (37.5 mg total)  by mouth daily with breakfast. Take along with 75 mg daily , total of 112.5 mg daily 90 capsule 0   venlafaxine XR (EFFEXOR-XR) 75 MG 24 hr capsule TAKE 1 CAPSULE(75 MG) BY MOUTH DAILY WITH BREAKFAST 90 capsule 0   vitamin B-12 (CYANOCOBALAMIN) 500 MCG tablet Take 500 mcg by mouth daily.     Vitamin D, Ergocalciferol, (DRISDOL) 1.25 MG (50000 UNIT) CAPS capsule Take 50,000 Units by mouth once a week.     No current facility-administered medications for this visit.     Musculoskeletal: Strength & Muscle Tone:  UTA Gait & Station:  Seated Patient leans: N/A  Psychiatric Specialty Exam: Review of Systems  Psychiatric/Behavioral: Negative.      There were no vitals taken for this visit.There is no height or weight on file to calculate BMI.  General Appearance: Casual  Eye Contact:  Fair  Speech:  Clear and Coherent  Volume:  Normal  Mood:  Euthymic  Affect:  Congruent  Thought Process:  Goal Directed and Descriptions of Associations: Intact  Orientation:  Full (Time, Place, and Person)  Thought Content: Logical   Suicidal Thoughts:  No  Homicidal Thoughts:  No  Memory:  Immediate;   Fair Recent;   Fair Remote;   Fair  Judgement:  Fair  Insight:  Fair  Psychomotor Activity:  Normal  Concentration:  Concentration: Fair and Attention Span: Fair  Recall:  [employer] of Knowledge: Fair  Language: Fair  Akathisia:  No  Handed:  Right  AIMS (if indicated): not done  Assets:  Desire for Improvement Housing Social Support  ADL's:  Intact  Cognition: WNL  Sleep:  Fair   Screenings: AIMS    Flowsheet Row Video Visit from 10/23/2021 in [facility]  AIMS Total Score 0      GAD-7    Flowsheet Row Office Visit from 06/08/2022 in [facility] Office Visit from 05/05/2022 in [facility] Office Visit from 02/10/2022 in [facility] Video  Visit from 02/05/2022 in [facility] Video Visit from 11/27/2021 in [facility]  Total GAD-7 Score 5 10 1 1  0      PHQ2-9    Flowsheet Row Office Visit from 08/21/2022 in [facility] Office Visit from 06/08/2022 in [facility] Office Visit from 05/05/2022 in [facility] Office Visit from 03/25/2022 in [facility] Office Visit from 02/10/2022 in [facility]  PHQ-2 Total Score 0 2 1 0 0  PHQ-9 Total Score 0 7 7 0 2      Flowsheet Row Video Visit from 09/17/2022 in [facility] Video Visit from 08/17/2022 in [facility] Office Visit from 06/08/2022 in [facility]  C-SSRS RISK CATEGORY No Risk No Risk No Risk        Assessment and Plan: Kaveah Carruth is a 37 year old female, widowed, lives in [place], has a history of MDD, GAD was evaluated by telemedicine today.  Patient is currently stable.  Plan as noted below.  Plan  MDD in remission Venlafaxine extended release 112.5 mg p.o. daily Lamotrigine 75 mg p.o. daily in divided dosage Patient to establish care with a therapist.  GAD-improving Venlafaxine extended release 112.5 mg p.o. daily Hydroxyzine 10 mg p.o. nightly as needed Patient encouraged to establish care with therapist.  Insomnia-stable Trazodone 25-50 mg p.o. nightly as needed  Follow-up in clinic in 3 months or sooner in person. Collaboration of Care: Collaboration of Care: Referral or follow-up with counselor/therapist AEB patient encouraged to establish care with a therapist.  Patient/Guardian was advised Release of Information must be obtained prior to any record release in order to collaborate their care  with an outside provider. Patient/Guardian was advised if they have not already done so to contact the registration department to sign all necessary forms in order for [place] to release information regarding their care.   Consent: Patient/Guardian gives verbal consent for treatment and assignment of benefits for services provided during this visit. Patient/Guardian expressed understanding and agreed to proceed.   This note was generated in part or whole with voice recognition software. Voice recognition is usually quite accurate but there are transcription errors that can and very often do occur. I apologize for any typographical errors that were not detected and corrected.    Jomarie Longs, MD 09/17/2022, 11:26 AM

## 2022-11-12 ENCOUNTER — Telehealth: Payer: Self-pay | Admitting: Psychiatry

## 2022-11-12 NOTE — Telephone Encounter (Signed)
Patient called requesting a sooner appointment. She was put on the wait list. States she is having a hard time to focus, concentrating, and forgetting simple things. Please advise

## 2022-11-13 NOTE — Telephone Encounter (Signed)
Patient called back and stated that she had just got off work and had a really bad migraine and requested to call the office back later after she take her meds and feels better patient informed that office will close today at noon she voiced understanding

## 2022-11-13 NOTE — Telephone Encounter (Signed)
Noted , please let her know to schedule a sooner appointment if needed.

## 2022-11-13 NOTE — Telephone Encounter (Signed)
Called to get more information about the message stating that patient was having some issues no answer unable to leave a voicemail due to mailbox full

## 2022-12-15 ENCOUNTER — Encounter: Payer: Self-pay | Admitting: Psychiatry

## 2022-12-15 ENCOUNTER — Telehealth (INDEPENDENT_AMBULATORY_CARE_PROVIDER_SITE_OTHER): Admitting: Psychiatry

## 2022-12-15 DIAGNOSIS — F411 Generalized anxiety disorder: Secondary | ICD-10-CM

## 2022-12-15 DIAGNOSIS — G4701 Insomnia due to medical condition: Secondary | ICD-10-CM | POA: Diagnosis not present

## 2022-12-15 DIAGNOSIS — F330 Major depressive disorder, recurrent, mild: Secondary | ICD-10-CM

## 2022-12-15 MED ORDER — HYDROXYZINE HCL 10 MG PO TABS
10.0000 mg | ORAL_TABLET | Freq: Every evening | ORAL | 0 refills | Status: DC | PRN
Start: 2022-12-15 — End: 2023-05-05

## 2022-12-15 MED ORDER — LAMOTRIGINE 25 MG PO TABS: 50.0000 mg | ORAL_TABLET | Freq: Every day | ORAL | 0 refills | Status: DC

## 2022-12-15 MED ORDER — VENLAFAXINE HCL ER 150 MG PO CP24
150.0000 mg | ORAL_CAPSULE | Freq: Every day | ORAL | 0 refills | Status: DC
Start: 2022-12-15 — End: 2022-12-23

## 2022-12-15 NOTE — Progress Notes (Unsigned)
Virtual Visit via Video Note  I connected with Volney American on 12/15/22 at  9:30 AM EDT by a video enabled telemedicine application and verified that I am speaking with the correct person using two identifiers.  Location Provider Location : [facility] Patient Location : Car  Participants: Patient , Provider   I discussed the limitations of evaluation and management by telemedicine and the availability of in person appointments. The patient expressed understanding and agreed to proceed.   I discussed the assessment and treatment plan with the patient. The patient was provided an opportunity to ask questions and all were answered. The patient agreed with the plan and demonstrated an understanding of the instructions.   The patient was advised to call back or seek an in-person evaluation if the symptoms worsen or if the condition fails to improve as anticipated.   BH MD OP Progress Note  12/15/2022 9:58 AM Olivia King  MRN:  [id number]  Chief Complaint:  Chief Complaint  Patient presents with   Follow-up   Anxiety   Depression   Medication Refill   HPI: Olivia King is a 37 year old female, lives in [place], employed, has a history of MDD, GAD, tobacco use disorder was evaluated by telemedicine today.  Patient today reports she is currently struggling with worsening anxiety and depression symptoms.  She reports work as a stressor and she has trouble following instructions, is easily distracted, has difficulty learning new things and keeping up .  Patient reports she struggles with short-term memory problems and does not know what could be contributing to it.  She does report feeling sad however does not believe she is overtly depressed either.  She does struggle with sleep problems.  Reports she goes to bed by around 10:30 PM to 11 PM and is awake by 4:30 AM.  She however does report sleep is restless.  Patient does have trazodone available however does not take it.  She uses  the hydroxyzine when she needs it.  That does seem to help when she takes it although does not take it regularly.  Denies side effects.  Patient reports the lamotrigine 75 mg caused her to have tremors of her hands.  She is currently taking lamotrigine 50 mg and that is doing well.  She denies side effects on that.  Patient denies any suicidality, homicidality or perceptual disturbances.  Patient is compliant on the venlafaxine.  Agreeable to dosage adjustment.  Patient was referred for CBT in the past however not interested.  Patient with a history of migraine headaches, continues to take Topamax, reports she continues to have headaches and does not believe the Topamax is effective.  She is agreeable to schedule an appointment with neurology.  Patient made aware about drug-drug interaction between Topamax and Lamictal as well as the effect of Topamax on her memory.  Patient denies any other concerns today.  Visit Diagnosis:    ICD-10-CM   1. MDD (major depressive disorder), recurrent episode, mild (HCC)  F33.0 lamoTRIgine (LAMICTAL) 25 MG tablet    2. GAD (generalized anxiety disorder)  F41.1 venlafaxine XR (EFFEXOR XR) 150 MG 24 hr capsule    hydrOXYzine (ATARAX) 10 MG tablet    3. Insomnia due to medical condition  G47.01    Anxiety      Past Psychiatric History: I have reviewed past psychiatric history from progress note on 10/10/2019.  Past trials of medications like Lexapro, Vyvanse, Abilify, Paxil, Zoloft, Cymbalta.  Past Medical History:  Past Medical History:  Diagnosis Date  Cigarette smoker    COVID-19 virus detected 03/21/2019   Family history of pancreatic cancer 08/2020   MyRisk neg except SDHA VUS; IBIS=9.2%/riskscore=7.3%   IUD contraception 02/06/2016   Placed January 23, 2016 by Althea Grimmer, PA [facility]   Major depressive disorder, recurrent (HCC) 08/19/2015   zoloft made her cry; cymbalta made her too sleepy   Migraine headache     Past Surgical  History:  Procedure Laterality Date   CESAREAN SECTION  2002-12-27 &2012   contraceptive implant      Family Psychiatric History: I have reviewed family psychiatric history from progress note on 10/10/2019.  Family History:  Family History  Problem Relation Age of Onset   Hypertension Mother    Pancreatic cancer Mother 24   Hypertension Brother    Aneurysm Maternal Grandmother    Heart attack Maternal Grandfather    Hypertension Paternal Grandmother    Heart failure Sister     Social History: I have reviewed social history from progress note on 10/10/2019. Social History   Socioeconomic History   Marital status: Widowed    Spouse name: Not on file   Number of children: 2   Years of education: Not on file   Highest education level: Associate degree: occupational, Scientist, product/process development, or vocational program  Occupational History   Not on file  Tobacco Use   Smoking status: Former    Current packs/day: 0.00    Types: Cigarettes    Start date: 03/05/2011    Quit date: 06/06/2022    Years since quitting: 0.5   Smokeless tobacco: Never  Vaping Use   Vaping status: Never Used  Substance and Sexual Activity   Alcohol use: Yes    Alcohol/week: 0.0 standard drinks of alcohol   Drug use: No   Sexual activity: Yes    Partners: Male    Birth control/protection: I.U.D.    Comment: Patient has liletta IUD  Other Topics Concern   Not on file  Social History Narrative   She is a widow, he died December 26, 2009 while he was deployed.    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   Social Determinants of Health   Financial Resource Strain: Low Risk  (09/15/2019)   Overall Financial Resource Strain (CARDIA)    Difficulty of Paying Living Expenses: Not hard at all  Food Insecurity: No Food Insecurity (05/10/2019)   Hunger Vital Sign    Worried About Running Out of Food in the Last Year: Never true    Ran Out of Food in the Last Year: Never true  Transportation Needs: No Transportation Needs (05/10/2019)    PRAPARE - Administrator, Civil Service (Medical): No    Lack of Transportation (Non-Medical): No  Physical Activity: Sufficiently Active (09/15/2019)   Exercise Vital Sign    Days of Exercise per Week: 3 days    Minutes of Exercise per Session: 50 min  Stress: No Stress Concern Present (09/15/2019)   [employer] of Occupational Health - Occupational Stress Questionnaire    Feeling of Stress : Not at all  Social Connections: Moderately Integrated (09/15/2019)   Social Connection and Isolation Panel [NHANES]    Frequency of Communication with Friends and Family: More than three times a week    Frequency of Social Gatherings with Friends and Family: More than three times a week    Attends Religious Services: More than 4 times per year    Active Member of [employer] or Organizations: Yes    Attends Club or  Organization Meetings: More than 4 times per year    Marital Status: Widowed    Allergies:  Allergies  Allergen Reactions   Sumatriptan Anaphylaxis    Throat and tongue swelling  Throat and tongue swelling  Throat and tongue swelling    Penicillins Rash   Vortioxetine Other (See Comments)    Tingling hands and blurred vision  Tingling hands and blurred vision  Tingling hands and blurred vision    Doxycycline     Unable to take it currently due to intracranial hypertension    Oxybate     Metabolic Disorder Labs: Lab Results  Component Value Date   HGBA1C 5.0 12/15/2021   MPG 97 12/15/2021   MPG 100 05/10/2019   No results found for: "PROLACTIN" Lab Results  Component Value Date   CHOL 164 12/15/2021   TRIG 67 12/15/2021   HDL 45 (L) 12/15/2021   CHOLHDL 3.6 12/15/2021   VLDL 11 03/19/2016   LDLCALC 104 (H) 12/15/2021   LDLCALC 116 (H) 12/17/2020   Lab Results  Component Value Date   TSH 2.49 12/17/2020   TSH 2.17 05/10/2019    Therapeutic Level Labs: No results found for: "LITHIUM" No results found for: "VALPROATE" No results found for:  "CBMZ"  Current Medications: Current Outpatient Medications  Medication Sig Dispense Refill   venlafaxine XR (EFFEXOR XR) 150 MG 24 hr capsule Take 1 capsule (150 mg total) by mouth daily with breakfast. 90 capsule 0   cetirizine (ZYRTEC) 10 MG tablet Take 10 mg by mouth daily.     clindamycin (CLEOCIN T) 1 % lotion Apply topically 2 (two) times daily. 60 mL 2   EPINEPHrine 0.3 mg/0.3 mL IJ SOAJ injection Inject into the muscle.     folic acid (FOLVITE) 1 MG tablet Take 1 mg by mouth daily.     hydrOXYzine (ATARAX) 10 MG tablet Take 1 tablet (10 mg total) by mouth at bedtime as needed. 90 tablet 0   ipratropium (ATROVENT) 0.03 % nasal spray ipratropium bromide 21 mcg (0.03 %) nasal spray     lamoTRIgine (LAMICTAL) 25 MG tablet Take 2 tablets (50 mg total) by mouth daily. 180 tablet 0   metroNIDAZOLE (METROGEL) 0.75 % vaginal gel INSERT 1 APPLICATORFUL VAGINALLY TWICE DAILY 70 g 0   Multiple Vitamin (MULTI-VITAMINS) TABS Take by mouth.     topiramate (TOPAMAX) 50 MG tablet Take 50 mg by mouth 3 (three) times daily.     traZODone (DESYREL) 50 MG tablet Take 0.5-1 tablets (25-50 mg total) by mouth at bedtime as needed for sleep. 30 tablet 1   vitamin B-12 (CYANOCOBALAMIN) 500 MCG tablet Take 500 mcg by mouth daily.     Vitamin D, Ergocalciferol, (DRISDOL) 1.25 MG (50000 UNIT) CAPS capsule Take 50,000 Units by mouth once a week.     No current facility-administered medications for this visit.     Musculoskeletal: Strength & Muscle Tone:  UTA Gait & Station:  Seated Patient leans: N/A  Psychiatric Specialty Exam: Review of Systems  Neurological:  Positive for headaches.  Psychiatric/Behavioral:  Positive for decreased concentration, dysphoric mood and sleep disturbance. The patient is nervous/anxious.     There were no vitals taken for this visit.There is no height or weight on file to calculate BMI.  General Appearance: Fairly Groomed  Eye Contact:  Fair  Speech:  Clear and Coherent   Volume:  Normal  Mood:  Anxious and Depressed  Affect:  Congruent  Thought Process:  Goal Directed and Descriptions of  Associations: Intact  Orientation:  Full (Time, Place, and Person)  Thought Content: Logical   Suicidal Thoughts:  No  Homicidal Thoughts:  No  Memory:  Immediate;   Fair Recent;   Fair Remote;   Fair has short-term memory problems  Judgement:  Fair  Insight:  Fair  Psychomotor Activity:  Normal  Concentration:  Concentration: Fair and Attention Span: Fair  Recall:  [employer] of Knowledge: Fair  Language: Fair  Akathisia:  No  Handed:  Right  AIMS (if indicated): not done  Assets:  Communication Skills Desire for Improvement Housing Social Support  ADL's:  Intact  Cognition: WNL  Sleep:  Poor   Screenings: AIMS    Flowsheet Row Video Visit from 10/23/2021 in [facility]  AIMS Total Score 0      GAD-7    Flowsheet Row Office Visit from 06/08/2022 in [facility] Office Visit from 05/05/2022 in [facility] Office Visit from 02/10/2022 in [facility] Video Visit from 02/05/2022 in [facility] Video Visit from 11/27/2021 in [facility]  Total GAD-7 Score 5 10 1 1  0      PHQ2-9    Flowsheet Row Office Visit from 08/21/2022 in [facility] Office Visit from 06/08/2022 in [facility] Office Visit from 05/05/2022 in [facility] Office Visit from 03/25/2022 in [facility] Office Visit from 02/10/2022 in [facility]  PHQ-2 Total Score 0 2 1 0 0  PHQ-9 Total Score 0 7 7 0 2      Flowsheet Row Video Visit from 12/15/2022 in [facility] Video  Visit from 09/17/2022 in [facility] Video Visit from 08/17/2022 in [facility]  C-SSRS RISK CATEGORY No Risk No Risk No Risk        Assessment and Plan: Olivia King is a 37 year old female, widowed, lives in [place], has a history of MDD, GAD was evaluated by telemedicine today.  Patient is currently struggling with worsening mood symptoms, sleep problems, memory and concentration as well as headaches, patient also with recent side effects to Lamictal currently on a lower dosage, will benefit from the following medication changes.  Plan as noted below.  Plan MDD-unstable Increase venlafaxine extended release 150 mg p.o. daily Reduce lamotrigine to 50 mg p.o. daily. Discussed referral to psychotherapist, patient declines.  GAD-unstable Increase venlafaxine extended release to 150 mg p.o. daily Hydroxyzine 10 mg p.o. nightly as needed Discuss referral for CBT-patient declines  Insomnia-unstable Discussed sleep hygiene techniques. Patient encouraged to make use of trazodone 25-50 mg p.o. nightly as needed   Discussed drug to drug interaction between lamotrigine and Topamax, lamotrigine can reduce the effect of Topamax and increase adverse side effects of Topamax.  Patient reports she will schedule an appointment with her neurologist to discuss her headaches and to get off of Topamax and she also has memory issues.  Patient also with history of vitamin B12 deficiency, will benefit from repeat vitamin B12 level-patient has upcoming appointment with primary care provider and would like to get it done at primary care provider office.  Will also recommend repeating TSH.  If unable to do it and primary care provider, discussed with patient we could do it here.  Follow-up in clinic  in 6 to 8 weeks or sooner if needed.  Collaboration of Care: Collaboration of Care: Primary Care Provider AEB patient advised  to follow up with primary care provider as well as neurology as discussed above.  Patient/Guardian was advised Release of Information must be obtained prior to any record release in order to collaborate their care with an outside provider. Patient/Guardian was advised if they have not already done so to contact the registration department to sign all necessary forms in order for [place] to release information regarding their care.   Consent: Patient/Guardian gives verbal consent for treatment and assignment of benefits for services provided during this visit. Patient/Guardian expressed understanding and agreed to proceed.   This note was generated in part or whole with voice recognition software. Voice recognition is usually quite accurate but there are transcription errors that can and very often do occur. I apologize for any typographical errors that were not detected and corrected.    Jomarie Longs, MD 12/16/2022, 9:04 AM

## 2022-12-23 ENCOUNTER — Telehealth: Payer: Self-pay

## 2022-12-23 ENCOUNTER — Ambulatory Visit: Admitting: Psychiatry

## 2022-12-23 DIAGNOSIS — F330 Major depressive disorder, recurrent, mild: Secondary | ICD-10-CM

## 2022-12-23 MED ORDER — VENLAFAXINE HCL ER 75 MG PO CP24
75.0000 mg | ORAL_CAPSULE | Freq: Every day | ORAL | 0 refills | Status: DC
Start: 2022-12-23 — End: 2023-05-05

## 2022-12-23 MED ORDER — VENLAFAXINE HCL ER 37.5 MG PO CP24
37.5000 mg | ORAL_CAPSULE | Freq: Every day | ORAL | 0 refills | Status: DC
Start: 2022-12-23 — End: 2023-05-05

## 2022-12-23 NOTE — Telephone Encounter (Signed)
I have reduced the venlafaxine to previous dosage of 112.5 mg daily, have sent new prescription to pharmacy.

## 2022-12-23 NOTE — Progress Notes (Unsigned)
Name: Olivia King   MRN: [id number]    DOB: [birth year]   Date:12/24/2022       Progress Note  Subjective  Chief Complaint  Annual Exam  HPI  Patient presents for annual CPE.  Diet: cooking at home, packs lunch for work.  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   Last Eye Exam: 01/28/2023 upcoming Last Dental Exam: Reminded to schedule  Flowsheet Row Office Visit from 12/24/2022 in [facility]  AUDIT-C Score 1      Depression: Phq 9 is  negative    12/24/2022   10:51 AM 08/21/2022    2:20 PM 06/08/2022    5:07 PM 05/05/2022    2:51 PM 03/25/2022    8:53 AM  Depression screen PHQ 2/9  Decreased Interest 0 0   0  Down, Depressed, Hopeless 0 0   0  PHQ - 2 Score 0 0   0  Altered sleeping 0 0   0  Tired, decreased energy 0 0   0  Change in appetite 0 0   0  Feeling bad or failure about yourself  0 0   0  Trouble concentrating 0 0   0  Moving slowly or fidgety/restless 0 0   0  Suicidal thoughts 0 0   0  PHQ-9 Score 0 0   0  Difficult doing work/chores  Not difficult at all        Information is confidential and restricted. Go to Review Flowsheets to unlock data.   Hypertension: BP Readings from Last 3 Encounters:  12/24/22 124/74  08/21/22 124/88  04/23/22 125/89   Obesity: Wt Readings from Last 3 Encounters:  12/24/22 242 lb 1.6 oz (109.8 kg)  08/21/22 230 lb 12.8 oz (104.7 kg)  04/23/22 228 lb (103.4 kg)   BMI Readings from Last 3 Encounters:  12/24/22 42.89 kg/m  08/21/22 40.88 kg/m  04/23/22 40.39 kg/m     Vaccines:   HPV: N/A , discussed HPV vaccines, she will check coverage insurance  Tdap: up to date Shingrix: N/A Pneumonia: N/A Flu: 2023, she will get it at work  COVID-19: up to date   Hep C Screening:  STD testing and prevention (HIV/chl/gon/syphilis): we will check today  Intimate partner violence: negative screen  Sexual History :  not currently sexually active but not sure if previous partner was faithful  Menstrual History/LMP/Abnormal Bleeding: regular cycles , has IUD  Discussed importance of follow up if any post-menopausal bleeding: N/A Incontinence Symptoms: stress incontinence   Breast cancer:  - Last Mammogram: N/A - BRCA gene screening: N/A  Osteoporosis Prevention : Discussed high calcium and vitamin D supplementation, weight bearing exercises Bone density: N/A   Cervical cancer screening: 08/16/20 but since no longer on same relationship we will recheck it today   Skin cancer: Discussed monitoring for atypical lesions  Colorectal cancer: N/A   Lung cancer:  Low Dose CT Chest recommended if Age 70-80 years, 20 pack-year currently smoking OR have quit w/in 15years. Patient does not qualify for screen   ECG: 12/09/21  Advanced Care Planning: A voluntary discussion about advance care planning including the explanation and discussion of advance directives.  Discussed health care proxy and Living will, and the patient was able to identify a health care proxy as father .  Patient does not have a living will and power of attorney of health care  Lipids: Lab Results  Component Value Date   CHOL 164 12/15/2021   CHOL 179 12/17/2020   CHOL 178 04/09/2017   Lab Results  Component Value Date   HDL 45 (L) 12/15/2021   HDL 47 (L) 12/17/2020   HDL 49 (L) 04/09/2017   Lab Results  Component Value Date   LDLCALC 104 (H) 12/15/2021   LDLCALC 116 (H) 12/17/2020   LDLCALC 110 (H) 04/09/2017   Lab Results  Component Value Date   TRIG 67 12/15/2021   TRIG 69 12/17/2020   TRIG 94 04/09/2017   Lab Results  Component Value Date   CHOLHDL 3.6 12/15/2021   CHOLHDL 3.8 12/17/2020   CHOLHDL 3.6 04/09/2017   No results found for: "LDLDIRECT"  Glucose: Glucose, Bld  Date Value Ref Range Status  12/15/2021 95 65 - 99 mg/dL Final    Comment:    .            Fasting reference interval .   09/07/2021 114  (H) 70 - 99 mg/dL Final    Comment:    Glucose reference range applies only to samples taken after fasting for at least 8 hours.  12/17/2020 90 65 - 99 mg/dL Final    Comment:    .            Fasting reference interval .     Patient Active Problem List   Diagnosis Date Noted   Carotid bruit 03/21/2022   Syncope 03/15/2022   Vertigo 02/18/2022   Insomnia 10/23/2021   Hidradenitis suppurativa 10/30/2020   Family history of pancreatic cancer 08/16/2020   MDD (major depressive disorder), recurrent episode, moderate (HCC) 02/13/2020   Tobacco use disorder 02/13/2020   MDD (major depressive disorder), recurrent episode, mild (HCC) 10/10/2019   GAD (generalized anxiety disorder) 10/10/2019   Eating disorder 10/10/2019   Lymphedema 04/10/2019   Chronic venous insufficiency 04/10/2019   Bilateral carpal tunnel syndrome 11/04/2018   Morbid obesity (HCC) 06/14/2017   Contraception management 01/06/2016   Low serum vitamin D 12/24/2014   Migraine without aura 12/21/2014    Past Surgical History:  Procedure Laterality Date   CESAREAN SECTION  2004 &2012   contraceptive implant      Family History  Problem Relation Age of Onset   Hypertension Mother    Pancreatic cancer Mother 60   Hypertension Brother    Aneurysm Maternal Grandmother    Heart attack Maternal Grandfather    Hypertension Paternal Grandmother    Heart failure Sister     Social History   Socioeconomic History   Marital status: Widowed    Spouse name: Not on file   Number of children: 2   Years of education: Not on file   Highest education level: Associate degree: occupational, Scientist, product/process development, or vocational program  Occupational History   Not on file  Tobacco Use   Smoking status: Former    Current packs/day: 0.00    Types: Cigarettes    Start date: 03/05/2011    Quit date: 06/06/2022    Years since quitting: 0.5   Smokeless tobacco: Never  Vaping Use   Vaping status: Never Used  Substance and Sexual  Activity   Alcohol use: Yes    Alcohol/week: 0.0 standard drinks of alcohol   Drug use: No   Sexual activity: Yes    Partners: Male    Birth control/protection: I.U.D.    Comment: Patient has liletta IUD  Other Topics Concern   Not on file  Social History  Narrative   She is a widow, he died Jan 14, 2010 while he was deployed.    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   Social Determinants of Health   Financial Resource Strain: Low Risk  (12/24/2022)   Overall Financial Resource Strain (CARDIA)    Difficulty of Paying Living Expenses: Not hard at all  Food Insecurity: No Food Insecurity (12/24/2022)   Hunger Vital Sign    Worried About Running Out of Food in the Last Year: Never true    Ran Out of Food in the Last Year: Never true  Transportation Needs: No Transportation Needs (12/24/2022)   PRAPARE - Administrator, Civil Service (Medical): No    Lack of Transportation (Non-Medical): No  Physical Activity: Sufficiently Active (12/24/2022)   Exercise Vital Sign    Days of Exercise per Week: 3 days    Minutes of Exercise per Session: 60 min  Stress: No Stress Concern Present (12/24/2022)   [employer] of Occupational Health - Occupational Stress Questionnaire    Feeling of Stress : Only a little  Social Connections: Moderately Integrated (12/24/2022)   Social Connection and Isolation Panel [NHANES]    Frequency of Communication with Friends and Family: More than three times a week    Frequency of Social Gatherings with Friends and Family: More than three times a week    Attends Religious Services: More than 4 times per year    Active Member of [employer] or Organizations: Yes    Attends Banker Meetings: More than 4 times per year    Marital Status: Widowed  Intimate Partner Violence: Not At Risk (12/24/2022)   Humiliation, Afraid, Rape, and Kick questionnaire    Fear of Current or Ex-Partner: No    Emotionally Abused: No    Physically Abused: No    Sexually Abused:  No     Current Outpatient Medications:    cetirizine (ZYRTEC) 10 MG tablet, Take 10 mg by mouth daily., Disp: , Rfl:    clindamycin (CLEOCIN T) 1 % lotion, Apply topically 2 (two) times daily., Disp: 60 mL, Rfl: 2   EPINEPHrine 0.3 mg/0.3 mL IJ SOAJ injection, Inject into the muscle., Disp: , Rfl:    folic acid (FOLVITE) 1 MG tablet, Take 1 mg by mouth daily., Disp: , Rfl:    hydrOXYzine (ATARAX) 10 MG tablet, Take 1 tablet (10 mg total) by mouth at bedtime as needed., Disp: 90 tablet, Rfl: 0   ipratropium (ATROVENT) 0.03 % nasal spray, ipratropium bromide 21 mcg (0.03 %) nasal spray, Disp: , Rfl:    lamoTRIgine (LAMICTAL) 25 MG tablet, Take 2 tablets (50 mg total) by mouth daily., Disp: 180 tablet, Rfl: 0   metroNIDAZOLE (METROGEL) 0.75 % vaginal gel, INSERT 1 APPLICATORFUL VAGINALLY TWICE DAILY, Disp: 70 g, Rfl: 0   Multiple Vitamin (MULTI-VITAMINS) TABS, Take by mouth., Disp: , Rfl:    traZODone (DESYREL) 50 MG tablet, Take 0.5-1 tablets (25-50 mg total) by mouth at bedtime as needed for sleep., Disp: 30 tablet, Rfl: 1   venlafaxine XR (EFFEXOR XR) 37.5 MG 24 hr capsule, Take 1 capsule (37.5 mg total) by mouth daily with breakfast. Take along with 75 mg daily, Disp: 90 capsule, Rfl: 0   venlafaxine XR (EFFEXOR XR) 75 MG 24 hr capsule, Take 1 capsule (75 mg total) by mouth daily with breakfast. Take along with 37.5 mg daily, Disp: 90 capsule, Rfl: 0   vitamin B-12 (CYANOCOBALAMIN) 500 MCG tablet, Take 500 mcg by mouth daily., Disp: ,  Rfl:    Vitamin D, Ergocalciferol, (DRISDOL) 1.25 MG (50000 UNIT) CAPS capsule, Take 50,000 Units by mouth once a week., Disp: , Rfl:    topiramate (TOPAMAX) 50 MG tablet, Take 50 mg by mouth 3 (three) times daily. (Patient not taking: Reported on 12/24/2022), Disp: , Rfl:   Allergies  Allergen Reactions   Sumatriptan Anaphylaxis    Throat and tongue swelling  Throat and tongue swelling  Throat and tongue swelling    Penicillins Rash   Vortioxetine Other (See  Comments)    Tingling hands and blurred vision  Tingling hands and blurred vision  Tingling hands and blurred vision    Doxycycline     Unable to take it currently due to intracranial hypertension    Oxybate      ROS  Constitutional: Negative for fever or weight change.  Respiratory: Negative for cough and shortness of breath.   Cardiovascular: Negative for chest pain or palpitations.  Gastrointestinal: Negative for abdominal pain, no bowel changes. She has episodes of nausea and vomiting about once a month  Musculoskeletal: Negative for gait problem or joint swelling.  Skin: Negative for rash.  Neurological: Negative for dizziness or headache.  No other specific complaints in a complete review of systems (except as listed in HPI above).   Objective  Vitals:   12/24/22 1041  BP: 124/74  Pulse: 94  Resp: 12  Temp: 98.2 F (36.8 C)  TempSrc: Oral  SpO2: 99%  Weight: 242 lb 1.6 oz (109.8 kg)  Height: 5\' 3"  (1.6 m)    Body mass index is 42.89 kg/m.  Physical Exam  Constitutional: Patient appears well-developed and well-nourished. No distress.  HENT: Head: Normocephalic and atraumatic. Ears: B TMs ok, no erythema or effusion; Nose: Nose normal. Mouth/Throat: Oropharynx is clear and moist. No oropharyngeal exudate.  Eyes: Conjunctivae and EOM are normal. Pupils are equal, round, and reactive to light. No scleral icterus.  Neck: Normal range of motion. Neck supple. No JVD present. No thyromegaly present.  Cardiovascular: Normal rate, regular rhythm and normal heart sounds.  No murmur heard. No BLE edema. Pulmonary/Chest: Effort normal and breath sounds normal. No respiratory distress. Abdominal: Soft. Bowel sounds are normal, no distension. There is no tenderness. no masses Breast: no lumps or masses, no nipple discharge or rashes FEMALE GENITALIA:  External genitalia normal External urethra normal Vaginal vault normal without discharge or lesions Cervix normal without  discharge or lesions, IUD in place  Bimanual exam normal without masses RECTAL: not done  Musculoskeletal: Normal range of motion, no joint effusions. No gross deformities Neurological: he is alert and oriented to person, place, and time. No cranial nerve deficit. Coordination, balance, strength, speech and gait are normal.  Skin: Skin is warm and dry. No rash noted. No erythema.  Psychiatric: Patient has a normal mood and affect. behavior is normal. Judgment and thought content normal.    Fall Risk:    12/24/2022   10:59 AM 08/21/2022    2:18 PM 03/25/2022    8:53 AM 02/10/2022    2:17 PM 12/15/2021    9:23 AM  Fall Risk   Falls in the past year? 0 0 0 0 0  Number falls in past yr:  0 0  0  Injury with Fall?  0 0  0  Risk for fall due to : No Fall Risks  No Fall Risks No Fall Risks No Fall Risks  Follow up Falls prevention discussed;Education provided;Falls evaluation completed  Falls prevention discussed Falls  prevention discussed;Education provided;Falls evaluation completed Falls prevention discussed     Functional Status Survey: Is the patient deaf or have difficulty hearing?: No Does the patient have difficulty seeing, even when wearing glasses/contacts?: No Does the patient have difficulty concentrating, remembering, or making decisions?: No Does the patient have difficulty walking or climbing stairs?: No Does the patient have difficulty dressing or bathing?: No Does the patient have difficulty doing errands alone such as visiting a doctor's office or shopping?: No   Assessment & Plan  1. Well adult exam  - Lipid panel - CBC with Differential/Platelet - COMPLETE METABOLIC PANEL WITH GFR - B12 and Folate Panel - TSH - VITAMIN D 25 Hydroxy (Vit-D Deficiency, Fractures) - Hemoglobin A1c - RPR - HIV Antibody (routine testing w rflx) - Cytology - PAP  2. Morbid obesity (HCC)  She has a Systems analyst   3. B12 deficiency  - CBC with Differential/Platelet -  COMPLETE METABOLIC PANEL WITH GFR - B12 and Folate Panel  4. Folic acid deficiency  Recheck today   5. Long-term use of high-risk medication  - TSH - VITAMIN D 25 Hydroxy (Vit-D Deficiency, Fractures)  6. Insulin resistance  - Hemoglobin A1c  7. Dyslipidemia  - Lipid panel  8. Cervical cancer screening  - Cytology - PAP  9. Screening examination for STI  - RPR - HIV Antibody (routine testing w rflx)  10. Vitamin D deficiency  - VITAMIN D 25 Hydroxy (Vit-D Deficiency, Fractures)  11. Liver disease  - MR LIVER W WO CONTRAST; Future    -USPSTF grade A and B recommendations reviewed with patient; age-appropriate recommendations, preventive care, screening tests, etc discussed and encouraged; healthy living encouraged; see AVS for patient education given to patient -Discussed importance of 150 minutes of physical activity weekly, eat two servings of fish weekly, eat one serving of tree nuts ( cashews, pistachios, pecans, almonds.Marland Kitchen) every other day, eat 6 servings of fruit/vegetables daily and drink plenty of water and avoid sweet beverages.   -Reviewed Health Maintenance: Yes.

## 2022-12-23 NOTE — Telephone Encounter (Signed)
pt left a message that she wants to go back down on the venlafaxine that since the increase she has been feeling nauseous, sleeping, and having itching spells. so she wants to go back on what she was taking before the increase. pt was last seen on 8-27 next appt 10-9

## 2022-12-23 NOTE — Telephone Encounter (Signed)
Pt.notified

## 2022-12-24 ENCOUNTER — Other Ambulatory Visit (HOSPITAL_COMMUNITY)
Admission: RE | Admit: 2022-12-24 | Discharge: 2022-12-24 | Disposition: A | Discharge status: Home / Self Care | Payer: 59 | Source: Ambulatory Visit | Attending: Family Medicine | Admitting: Family Medicine

## 2022-12-24 ENCOUNTER — Ambulatory Visit (INDEPENDENT_AMBULATORY_CARE_PROVIDER_SITE_OTHER): Admitting: Family Medicine

## 2022-12-24 ENCOUNTER — Encounter: Payer: Self-pay | Admitting: Family Medicine

## 2022-12-24 VITALS — BP 124/74 | HR 94 | Temp 98.2°F | Resp 12 | Ht 63.0 in | Wt 242.1 lb

## 2022-12-24 DIAGNOSIS — Z0000 Encounter for general adult medical examination without abnormal findings: Secondary | ICD-10-CM | POA: Diagnosis not present

## 2022-12-24 DIAGNOSIS — Z124 Encounter for screening for malignant neoplasm of cervix: Secondary | ICD-10-CM | POA: Diagnosis not present

## 2022-12-24 DIAGNOSIS — E785 Hyperlipidemia, unspecified: Secondary | ICD-10-CM | POA: Diagnosis not present

## 2022-12-24 DIAGNOSIS — D649 Anemia, unspecified: Secondary | ICD-10-CM | POA: Diagnosis not present

## 2022-12-24 DIAGNOSIS — Z113 Encounter for screening for infections with a predominantly sexual mode of transmission: Secondary | ICD-10-CM

## 2022-12-24 DIAGNOSIS — E88819 Insulin resistance, unspecified: Secondary | ICD-10-CM

## 2022-12-24 DIAGNOSIS — E538 Deficiency of other specified B group vitamins: Secondary | ICD-10-CM

## 2022-12-24 DIAGNOSIS — K769 Liver disease, unspecified: Secondary | ICD-10-CM

## 2022-12-24 DIAGNOSIS — E559 Vitamin D deficiency, unspecified: Secondary | ICD-10-CM

## 2022-12-24 DIAGNOSIS — Z79899 Other long term (current) drug therapy: Secondary | ICD-10-CM

## 2022-12-28 ENCOUNTER — Other Ambulatory Visit: Payer: Self-pay

## 2022-12-28 DIAGNOSIS — D649 Anemia, unspecified: Secondary | ICD-10-CM

## 2022-12-29 ENCOUNTER — Ambulatory Visit
Admission: RE | Admit: 2022-12-29 | Discharge: 2022-12-29 | Disposition: A | Discharge status: Home / Self Care | Payer: 59 | Source: Ambulatory Visit | Attending: Family Medicine | Admitting: Family Medicine

## 2022-12-29 DIAGNOSIS — R112 Nausea with vomiting, unspecified: Secondary | ICD-10-CM | POA: Diagnosis not present

## 2022-12-29 DIAGNOSIS — K769 Liver disease, unspecified: Secondary | ICD-10-CM | POA: Diagnosis not present

## 2022-12-29 DIAGNOSIS — R109 Unspecified abdominal pain: Secondary | ICD-10-CM | POA: Diagnosis not present

## 2022-12-29 DIAGNOSIS — K449 Diaphragmatic hernia without obstruction or gangrene: Secondary | ICD-10-CM | POA: Diagnosis not present

## 2022-12-29 LAB — LIPID PANEL
Cholesterol: 183 mg/dL (ref <200)
HDL: 50 mg/dL (ref >=50)
LDL Cholesterol (Calc): 114 mg/dL — ABNORMAL HIGH
Non-HDL Cholesterol (Calc): 133 mg/dL — ABNORMAL HIGH (ref <130)
Total CHOL/HDL Ratio: 3.7 (calc) (ref <5.0)
Triglycerides: 86 mg/dL (ref <150)

## 2022-12-29 LAB — CBC WITH DIFFERENTIAL/PLATELET
Absolute Monocytes: 693 {cells}/uL (ref 200–950)
Basophils Absolute: 18 {cells}/uL (ref 0–200)
Basophils Relative: 0.2 %
Eosinophils Absolute: 0 {cells}/uL — ABNORMAL LOW (ref 15–500)
Eosinophils Relative: 0 %
HCT: 33.7 % — ABNORMAL LOW (ref 35.0–45.0)
Hemoglobin: 11.5 g/dL — ABNORMAL LOW (ref 11.7–15.5)
Lymphs Abs: 2664 {cells}/uL (ref 850–3900)
MCH: 29.8 pg (ref 27.0–33.0)
MCHC: 34.1 g/dL (ref 32.0–36.0)
MCV: 87.3 fL (ref 80.0–100.0)
MPV: 10 fL (ref 7.5–12.5)
Monocytes Relative: 7.7 %
Neutro Abs: 5625 {cells}/uL (ref 1500–7800)
Neutrophils Relative %: 62.5 %
Platelets: 323 10*3/uL (ref 140–400)
RBC: 3.86 10*6/uL (ref 3.80–5.10)
RDW: 12.6 % (ref 11.0–15.0)
Total Lymphocyte: 29.6 %
WBC: 9 10*3/uL (ref 3.8–10.8)

## 2022-12-29 LAB — HEMOGLOBIN A1C
Hgb A1c MFr Bld: 5.4 %{Hb} (ref <5.7)
Mean Plasma Glucose: 108 mg/dL
eAG (mmol/L): 6 mmol/L

## 2022-12-29 LAB — COMPLETE METABOLIC PANEL WITH GFR
AG Ratio: 1.4 (calc) (ref 1.0–2.5)
ALT: 19 U/L (ref 6–29)
AST: 27 U/L (ref 10–30)
Albumin: 4 g/dL (ref 3.6–5.1)
Alkaline phosphatase (APISO): 57 U/L (ref 31–125)
BUN: 10 mg/dL (ref 7–25)
CO2: 25 mmol/L (ref 20–32)
Calcium: 9.5 mg/dL (ref 8.6–10.2)
Chloride: 104 mmol/L (ref 98–110)
Creat: 0.91 mg/dL (ref 0.50–0.97)
Globulin: 2.9 g/dL (ref 1.9–3.7)
Glucose, Bld: 81 mg/dL (ref 65–99)
Potassium: 4.6 mmol/L (ref 3.5–5.3)
Sodium: 136 mmol/L (ref 135–146)
Total Bilirubin: 0.5 mg/dL (ref 0.2–1.2)
Total Protein: 6.9 g/dL (ref 6.1–8.1)
eGFR: 83 mL/min/{1.73_m2} (ref >=60)

## 2022-12-29 LAB — TEST AUTHORIZATION

## 2022-12-29 LAB — RPR: RPR Ser Ql: NONREACTIVE

## 2022-12-29 LAB — CYTOLOGY - PAP
Adequacy: ABSENT
Chlamydia: NEGATIVE
Comment: NEGATIVE
Comment: NEGATIVE
Comment: NORMAL
Diagnosis: UNDETERMINED — AB
High risk HPV: NEGATIVE
Neisseria Gonorrhea: NEGATIVE

## 2022-12-29 LAB — HIV ANTIBODY (ROUTINE TESTING W REFLEX): HIV 1&2 Ab, 4th Generation: NONREACTIVE

## 2022-12-29 LAB — IRON,TIBC AND FERRITIN PANEL: Ferritin: 104 ng/mL (ref 16–154)

## 2022-12-29 LAB — B12 AND FOLATE PANEL
Folate: 8 ng/mL
Vitamin B-12: 559 pg/mL (ref 200–1100)

## 2022-12-29 LAB — TSH: TSH: 2.2 m[IU]/L

## 2022-12-29 LAB — VITAMIN D 25 HYDROXY (VIT D DEFICIENCY, FRACTURES): Vit D, 25-Hydroxy: 27 ng/mL — ABNORMAL LOW (ref 30–100)

## 2022-12-29 MED ORDER — GADOBUTROL 1 MMOL/ML IV SOLN
10.0000 mL | Freq: Once | INTRAVENOUS | Status: AC | PRN
  Administered 2022-12-29: 7.5 mL via INTRAVENOUS

## 2023-01-01 ENCOUNTER — Other Ambulatory Visit: Payer: Self-pay | Admitting: Student

## 2023-01-01 DIAGNOSIS — G932 Benign intracranial hypertension: Secondary | ICD-10-CM

## 2023-01-02 ENCOUNTER — Ambulatory Visit
Admission: RE | Admit: 2023-01-02 | Discharge: 2023-01-02 | Disposition: A | Discharge status: Home / Self Care | Payer: 59 | Source: Ambulatory Visit | Attending: Student | Admitting: Student

## 2023-01-02 DIAGNOSIS — R519 Headache, unspecified: Secondary | ICD-10-CM | POA: Diagnosis not present

## 2023-01-02 DIAGNOSIS — G932 Benign intracranial hypertension: Secondary | ICD-10-CM | POA: Insufficient documentation

## 2023-01-02 DIAGNOSIS — R930 Abnormal findings on diagnostic imaging of skull and head, not elsewhere classified: Secondary | ICD-10-CM | POA: Diagnosis not present

## 2023-01-02 MED ORDER — GADOBUTROL 1 MMOL/ML IV SOLN
10.0000 mL | Freq: Once | INTRAVENOUS | Status: AC | PRN
  Administered 2023-01-02: 10 mL via INTRAVENOUS

## 2023-01-20 ENCOUNTER — Telehealth: Payer: Self-pay

## 2023-01-20 NOTE — Telephone Encounter (Signed)
Patient called states she found a compounding pharmacy wants to know if that is something you prescribe for with ozempic or wegovy? That she could pay out of pocket w/

## 2023-01-20 NOTE — Telephone Encounter (Signed)
Tried to call patient but mailbox full.  She call back and I was having a hard time hearing her, she stated would call back.  Dr. Caralee Ates was reviewing her appt for tomorrow its says she is coming back for 1 month f/u.  Per Dr. Carlynn Purl last note on 9/5 she was supposed to be following back up in 3 months? Unclear of appt tomoorw morning at 8 w/ dr. Caralee Ates, unsure if she really needs to be seen and if so for what? Was trying to save her a unnecessarily trip?

## 2023-01-21 ENCOUNTER — Ambulatory Visit: Admitting: Internal Medicine

## 2023-01-25 NOTE — Telephone Encounter (Signed)
Pt.notified

## 2023-01-27 ENCOUNTER — Encounter: Payer: Self-pay | Admitting: Psychiatry

## 2023-01-27 ENCOUNTER — Telehealth (INDEPENDENT_AMBULATORY_CARE_PROVIDER_SITE_OTHER): Payer: 59 | Admitting: Psychiatry

## 2023-01-27 DIAGNOSIS — G4701 Insomnia due to medical condition: Secondary | ICD-10-CM | POA: Diagnosis not present

## 2023-01-27 DIAGNOSIS — F411 Generalized anxiety disorder: Secondary | ICD-10-CM

## 2023-01-27 DIAGNOSIS — F3341 Major depressive disorder, recurrent, in partial remission: Secondary | ICD-10-CM | POA: Diagnosis not present

## 2023-01-27 MED ORDER — LAMOTRIGINE 25 MG PO TABS
75.0000 mg | ORAL_TABLET | Freq: Every day | ORAL | 0 refills | Status: DC
Start: 2023-01-27 — End: 2023-05-05

## 2023-01-27 NOTE — Progress Notes (Unsigned)
Virtual Visit via Video Note  I connected with Olivia King on 01/27/23 at  3:30 PM EDT by a video enabled telemedicine application and verified that I am speaking with the correct person using two identifiers.  Location Provider Location : [facility] Patient Location : Work  Participants: Patient , Provider   I discussed the limitations of evaluation and management by telemedicine and the availability of in person appointments. The patient expressed understanding and agreed to proceed.   I discussed the assessment and treatment plan with the patient. The patient was provided an opportunity to ask questions and all were answered. The patient agreed with the plan and demonstrated an understanding of the instructions.   The patient was advised to call back or seek an in-person evaluation if the symptoms worsen or if the condition fails to improve as anticipated.    BH MD OP Progress Note  01/27/2023 3:51 PM Olivia King  MRN:  [id number]  Chief Complaint:  Chief Complaint  Patient presents with   Follow-up   Anxiety   Depression   Medication Refill   HPI: Olivia King is a 37 year old female, currently employed , lives in [place], widowed, has a history of MDD, GAD, tobacco use disorder insomnia was evaluated by telemedicine today.  Patient today reports she has noticed improvement in her anxiety and depression symptoms.  She does not feel depressed anymore.  She however does have anxiety mostly situational.  The last few days of last week and beginning of this week she did have a lot of anxiety.  She however is not interested in increasing the dosage of venlafaxine since it caused her side effects when she took 150 mg.  She is tolerating the venlafaxine extended release 112.5 mg.  Patient today reports she never increased the dosage of Lamictal as discussed at her previous visit to a 75 mg.  Patient reports it was the venlafaxine that caused tremors and not Lamictal.  She  reports she has always taken Lamictal 50 mg although it was increased to 75 mg a few months ago.  Patient would like to try the 75 mg to see if that will help with her mood symptoms better.  Patient reports sleep is overall good.  Patient reports appetite is fair.  She reports she is doing okay at her work, getting used to it.  Patient has a birthday coming up in a few days.  She plans to spend it with her family.  She denies any suicidality, homicidality or perceptual disturbances.  She is currently being tapered off of the Topamax by her neurologist.  She plans to talk to them about a medication to replace Topamax for her headaches.  Patient denies any other concerns today.  Visit Diagnosis:    ICD-10-CM   1. MDD (major depressive disorder), recurrent episode, mild (HCC)  F33.0 lamoTRIgine (LAMICTAL) 25 MG tablet    2. GAD (generalized anxiety disorder)  F41.1     3. Insomnia due to medical condition  G47.01    Anxiety      Past Psychiatric History: I have reviewed past psychiatric history from progress note on 10/10/2019.  Past trials of medications like Lexapro, Vyvanse, Abilify, Paxil, Zoloft, Cymbalta.  Past Medical History:  Past Medical History:  Diagnosis Date   Cigarette smoker    COVID-19 virus detected 03/21/2019   Family history of pancreatic cancer 08/2020   MyRisk neg except SDHA VUS; IBIS=9.2%/riskscore=7.3%   IUD contraception 02/06/2016   Placed January 23, 2016 by Althea Grimmer,  [facility]   Major depressive disorder, recurrent (HCC) 08/19/2015   zoloft made her cry; cymbalta made her too sleepy   Migraine headache     Past Surgical History:  Procedure Laterality Date   CESAREAN SECTION  2003-02-18 &2012   contraceptive implant      Family Psychiatric History: I have reviewed family psychiatric history from progress note on 10/10/2019.  Family History:  Family History  Problem Relation Age of Onset   Hypertension Mother    Pancreatic cancer  Mother 17   Hypertension Brother    Aneurysm Maternal Grandmother    Heart attack Maternal Grandfather    Hypertension Paternal Grandmother    Heart failure Sister     Social History: I have reviewed social history from progress note on 10/10/2019. Social History   Socioeconomic History   Marital status: Widowed    Spouse name: Not on file   Number of children: 2   Years of education: Not on file   Highest education level: Associate degree: occupational, Scientist, product/process development, or vocational program  Occupational History   Not on file  Tobacco Use   Smoking status: Former    Current packs/day: 0.00    Types: Cigarettes    Start date: 03/05/2011    Quit date: 06/06/2022    Years since quitting: 0.6   Smokeless tobacco: Never  Vaping Use   Vaping status: Never Used  Substance and Sexual Activity   Alcohol use: Yes    Alcohol/week: 0.0 standard drinks of alcohol   Drug use: No   Sexual activity: Yes    Partners: Male    Birth control/protection: I.U.D.    Comment: Patient has liletta IUD  Other Topics Concern   Not on file  Social History Narrative   She is a widow, he died 02-17-10 while he was deployed.    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   Social Determinants of Health   Financial Resource Strain: Low Risk  (12/24/2022)   Overall Financial Resource Strain (CARDIA)    Difficulty of Paying Living Expenses: Not hard at all  Food Insecurity: No Food Insecurity (12/24/2022)   Hunger Vital Sign    Worried About Running Out of Food in the Last Year: Never true    Ran Out of Food in the Last Year: Never true  Transportation Needs: No Transportation Needs (12/24/2022)   PRAPARE - Administrator, Civil Service (Medical): No    Lack of Transportation (Non-Medical): No  Physical Activity: Sufficiently Active (12/24/2022)   Exercise Vital Sign    Days of Exercise per Week: 3 days    Minutes of Exercise per Session: 60 min  Stress: No Stress Concern Present (12/24/2022)   [employer] of Occupational Health - Occupational Stress Questionnaire    Feeling of Stress : Only a little  Social Connections: Moderately Integrated (12/24/2022)   Social Connection and Isolation Panel [NHANES]    Frequency of Communication with Friends and Family: More than three times a week    Frequency of Social Gatherings with Friends and Family: More than three times a week    Attends Religious Services: More than 4 times per year    Active Member of [employer] or Organizations: Yes    Attends Banker Meetings: More than 4 times per year    Marital Status: Widowed    Allergies:  Allergies  Allergen Reactions   Sumatriptan Anaphylaxis    Throat and tongue swelling  Throat and tongue swelling  Throat and tongue swelling    Penicillins Rash   Vortioxetine Other (See Comments)    Tingling hands and blurred vision  Tingling hands and blurred vision  Tingling hands and blurred vision    Doxycycline     Unable to take it currently due to intracranial hypertension    Oxybate     Metabolic Disorder Labs: Lab Results  Component Value Date   HGBA1C 5.4 12/24/2022   MPG 108 12/24/2022   MPG 97 12/15/2021   No results found for: "PROLACTIN" Lab Results  Component Value Date   CHOL 183 12/24/2022   TRIG 86 12/24/2022   HDL 50 12/24/2022   CHOLHDL 3.7 12/24/2022   VLDL 11 03/19/2016   LDLCALC 114 (H) 12/24/2022   LDLCALC 104 (H) 12/15/2021   Lab Results  Component Value Date   TSH 2.20 12/24/2022   TSH 2.49 12/17/2020    Therapeutic Level Labs: No results found for: "LITHIUM" No results found for: "VALPROATE" No results found for: "CBMZ"  Current Medications: Current Outpatient Medications  Medication Sig Dispense Refill   cetirizine (ZYRTEC) 10 MG tablet Take 10 mg by mouth daily.     clindamycin (CLEOCIN T) 1 % lotion Apply topically 2 (two) times daily. 60 mL 2   EPINEPHrine 0.3 mg/0.3 mL IJ SOAJ injection Inject into the muscle.     folic acid  (FOLVITE) 1 MG tablet Take 1 mg by mouth daily.     hydrOXYzine (ATARAX) 10 MG tablet Take 1 tablet (10 mg total) by mouth at bedtime as needed. 90 tablet 0   ipratropium (ATROVENT) 0.03 % nasal spray ipratropium bromide 21 mcg (0.03 %) nasal spray     lamoTRIgine (LAMICTAL) 25 MG tablet Take 3 tablets (75 mg total) by mouth daily. 270 tablet 0   metroNIDAZOLE (METROGEL) 0.75 % vaginal gel INSERT 1 APPLICATORFUL VAGINALLY TWICE DAILY 70 g 0   Multiple Vitamin (MULTI-VITAMINS) TABS Take by mouth.     topiramate (TOPAMAX) 50 MG tablet Take 50 mg by mouth 3 (three) times daily. (Patient not taking: Reported on 12/24/2022)     traZODone (DESYREL) 50 MG tablet Take 0.5-1 tablets (25-50 mg total) by mouth at bedtime as needed for sleep. 30 tablet 1   venlafaxine XR (EFFEXOR XR) 37.5 MG 24 hr capsule Take 1 capsule (37.5 mg total) by mouth daily with breakfast. Take along with 75 mg daily 90 capsule 0   venlafaxine XR (EFFEXOR XR) 75 MG 24 hr capsule Take 1 capsule (75 mg total) by mouth daily with breakfast. Take along with 37.5 mg daily 90 capsule 0   vitamin B-12 (CYANOCOBALAMIN) 500 MCG tablet Take 500 mcg by mouth daily.     Vitamin D, Ergocalciferol, (DRISDOL) 1.25 MG (50000 UNIT) CAPS capsule Take 50,000 Units by mouth once a week.     No current facility-administered medications for this visit.     Musculoskeletal: Strength & Muscle Tone:  UTA Gait & Station: normal Patient leans: N/A  Psychiatric Specialty Exam: Review of Systems  Psychiatric/Behavioral:  The patient is nervous/anxious.     There were no vitals taken for this visit.There is no height or weight on file to calculate BMI.  General Appearance: Fairly Groomed  Eye Contact:  Fair  Speech:  Clear and Coherent  Volume:  Normal  Mood:  Anxious  Affect:  Congruent  Thought Process:  Goal Directed and Descriptions of Associations: Intact  Orientation:  Full (Time, Place, and Person)  Thought Content: Logical   Suicidal  Thoughts:  No  Homicidal Thoughts:  No  Memory:  Immediate;   Fair Recent;   Fair Remote;   Fair  Judgement:  Fair  Insight:  Fair  Psychomotor Activity:  Normal  Concentration:  Concentration: Fair and Attention Span: Fair  Recall:  [employer] of Knowledge: Fair  Language: Fair  Akathisia:  No  Handed:  Right  AIMS (if indicated): not done  Assets:  Communication Skills Desire for Improvement Housing Social Support  ADL's:  Intact  Cognition: WNL  Sleep:  Fair   Screenings: AIMS    Flowsheet Row Video Visit from 10/23/2021 in [facility]  AIMS Total Score 0      GAD-7    Flowsheet Row Office Visit from 12/24/2022 in [facility] Office Visit from 06/08/2022 in [facility] Office Visit from 05/05/2022 in [facility] Office Visit from 02/10/2022 in [facility] Video Visit from 02/05/2022 in [facility]  Total GAD-7 Score 0 5 10 1 1       PHQ2-9    Flowsheet Row Office Visit from 12/24/2022 in [facility] Office Visit from 08/21/2022 in [facility] Office Visit from 06/08/2022 in [facility] Office Visit from 05/05/2022 in [facility] Office Visit from 03/25/2022 in [facility]  PHQ-2 Total Score 0 0 2 1 0  PHQ-9 Total Score 0 0 7 7 0      Flowsheet Row Video Visit from 12/15/2022 in [facility] Video Visit from 09/17/2022 in [facility] Video Visit from 08/17/2022 in [facility]  C-SSRS RISK CATEGORY No Risk No Risk No Risk        Assessment and Plan: Olivia King is a 37 year old  female, widowed, lives in [place], has a history of MDD, GAD was evaluated by telemedicine today.  Patient is currently improving with regards to her depression although continues to have anxiety mostly situational, patient is interested in dosage increase of Lamictal since she did not tolerate the higher dosage of venlafaxine which was recently increased.  Patient will benefit from the following plan.  Plan MDD-in partial remission Venlafaxine extended release 112.5 mg p.o. daily Increase lamotrigine to 75 mg p.o. daily since patient is interested in trial of 75 mg and currently reports she never tried the 75 and it was prescribed previously.   GAD-improving Continue venlafaxine extended release 112.5 mg p.o. daily Referred for CBT in the past, patient declined. Hydroxyzine 10 mg p.o. nightly as needed  Insomnia-improving Patient to continue sleep hygiene techniques Trazodone 25-50 mg p.o. nightly as needed    Collaboration of Care: Collaboration of Care: Patient refused AEB patient declined psychotherapy referral  Patient/Guardian was advised Release of Information must be obtained prior to any record release in order to collaborate their care with an outside provider. Patient/Guardian was advised if they have not already done so to contact the registration department to sign all necessary forms in order for [place] to release information regarding their care.   Consent: Patient/Guardian gives verbal consent for treatment and assignment of benefits for services provided during this visit. Patient/Guardian expressed understanding and agreed to proceed.  Follow-up in clinic in 4 weeks or sooner in person.  This note was generated in part or whole  with voice recognition software. Voice recognition is usually quite accurate but there are transcription errors that can and very often do occur. I apologize for any typographical errors that were not detected and corrected.     Jomarie Longs,  MD 01/27/2023, 3:51 PM

## 2023-02-07 ENCOUNTER — Other Ambulatory Visit: Payer: Self-pay | Admitting: Family Medicine

## 2023-03-02 ENCOUNTER — Ambulatory Visit (INDEPENDENT_AMBULATORY_CARE_PROVIDER_SITE_OTHER): Payer: 59 | Admitting: Psychiatry

## 2023-03-02 ENCOUNTER — Encounter: Payer: Self-pay | Admitting: Psychiatry

## 2023-03-02 VITALS — BP 142/91 | HR 112 | Temp 98.1°F | Ht 63.0 in | Wt 249.0 lb

## 2023-03-02 DIAGNOSIS — G4701 Insomnia due to medical condition: Secondary | ICD-10-CM | POA: Diagnosis not present

## 2023-03-02 DIAGNOSIS — F411 Generalized anxiety disorder: Secondary | ICD-10-CM | POA: Diagnosis not present

## 2023-03-02 DIAGNOSIS — F3342 Major depressive disorder, recurrent, in full remission: Secondary | ICD-10-CM | POA: Diagnosis not present

## 2023-03-02 DIAGNOSIS — F3341 Major depressive disorder, recurrent, in partial remission: Secondary | ICD-10-CM

## 2023-03-02 NOTE — Progress Notes (Unsigned)
BH MD OP Progress Note  03/03/2023 8:19 AM Olivia King  MRN:  [id number]  Chief Complaint:  Chief Complaint  Patient presents with   Follow-up   Depression   Anxiety   Medication Refill   HPI: Olivia King is a 37 year old female, employed, lives in [place], widowed, has a history of MDD, GAD, tobacco use disorder, insomnia was evaluated in office today.  Patient today reports she is currently doing fairly well with regards to her mood symptoms.  She does not feel as depressed as she used to before.  The Lamictal dosage has helped.  She is tolerating the Lamictal 75 mg now.  Denies side effects.  She is compliant on the venlafaxine per report.  Denies side effects.  Patient however reports she recently had an incident at work when she felt triggered by another Radio broadcast assistant.  Patient reports she impulsively blurted out something at this person since she felt she was being followed after she said something to her.  Patient reports she however got written up and was told she could get terminated if she gets written up again.  That does worry her.  Patient reports she continues to have anxiety about her teenage daughter and she struggles with the behavioral changes that her daughter is going through.  Patient reports she has been noncompliant with psychotherapy referrals however will be motivated to establish care with a therapist to work on her current situational stressors as noted above.  She is not interested in further medication changes.  Patient denies any suicidality, homicidality or perceptual disturbances.  Patient denies any other concerns today.  Visit Diagnosis:    ICD-10-CM   1. Recurrent major depressive disorder, in full remission (HCC)  F33.42     2. GAD (generalized anxiety disorder)  F41.1     3. Insomnia due to medical condition  G47.01    Anxiety      Past Psychiatric History: I have reviewed past psychiatric history from progress note on 10/10/2019.  Past  trials of medications like Lexapro, Vyvanse, Abilify, Paxil, Zoloft, Cymbalta.  Past Medical History:  Past Medical History:  Diagnosis Date   Cigarette smoker    COVID-19 virus detected 03/21/2019   Family history of pancreatic cancer 08/2020   MyRisk neg except SDHA VUS; IBIS=9.2%/riskscore=7.3%   IUD contraception 02/06/2016   Placed January 23, 2016 by Althea Grimmer, PA [facility]   Major depressive disorder, recurrent (HCC) 08/19/2015   zoloft made her cry; cymbalta made her too sleepy   Migraine headache     Past Surgical History:  Procedure Laterality Date   CESAREAN SECTION  2004 &2012   contraceptive implant      Family Psychiatric History: I have reviewed family psychiatric history from progress note on 10/10/2019.  Family History:  Family History  Problem Relation Age of Onset   Hypertension Mother    Pancreatic cancer Mother 29   Hypertension Brother    Aneurysm Maternal Grandmother    Heart attack Maternal Grandfather    Hypertension Paternal Grandmother    Heart failure Sister     Social History: I have reviewed social history from progress note on 10/10/2019. Social History   Socioeconomic History   Marital status: Widowed    Spouse name: Not on file   Number of children: 2   Years of education: Not on file   Highest education level: Associate degree: occupational, Scientist, product/process development, or vocational program  Occupational History   Not on file  Tobacco Use   Smoking  status: Former    Current packs/day: 0.00    Types: Cigarettes    Start date: 03/05/2011    Quit date: 06/06/2022    Years since quitting: 0.7   Smokeless tobacco: Never  Vaping Use   Vaping status: Never Used  Substance and Sexual Activity   Alcohol use: Yes    Alcohol/week: 0.0 standard drinks of alcohol   Drug use: No   Sexual activity: Yes    Partners: Male    Birth control/protection: I.U.D.    Comment: Patient has liletta IUD  Other Topics Concern   Not on file  Social History  Narrative   She is a widow, he died 03-29-10 while he was deployed.    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   Social Determinants of Health   Financial Resource Strain: Low Risk  (12/24/2022)   Overall Financial Resource Strain (CARDIA)    Difficulty of Paying Living Expenses: Not hard at all  Food Insecurity: No Food Insecurity (12/24/2022)   Hunger Vital Sign    Worried About Running Out of Food in the Last Year: Never true    Ran Out of Food in the Last Year: Never true  Transportation Needs: No Transportation Needs (12/24/2022)   PRAPARE - Administrator, Civil Service (Medical): No    Lack of Transportation (Non-Medical): No  Physical Activity: Sufficiently Active (12/24/2022)   Exercise Vital Sign    Days of Exercise per Week: 3 days    Minutes of Exercise per Session: 60 min  Stress: No Stress Concern Present (12/24/2022)   [employer] of Occupational Health - Occupational Stress Questionnaire    Feeling of Stress : Only a little  Social Connections: Moderately Integrated (12/24/2022)   Social Connection and Isolation Panel [NHANES]    Frequency of Communication with Friends and Family: More than three times a week    Frequency of Social Gatherings with Friends and Family: More than three times a week    Attends Religious Services: More than 4 times per year    Active Member of [employer] or Organizations: Yes    Attends Banker Meetings: More than 4 times per year    Marital Status: Widowed    Allergies:  Allergies  Allergen Reactions   Sumatriptan Anaphylaxis    Throat and tongue swelling  Throat and tongue swelling  Throat and tongue swelling    Penicillins Rash   Vortioxetine Other (See Comments)    Tingling hands and blurred vision  Tingling hands and blurred vision  Tingling hands and blurred vision    Doxycycline     Unable to take it currently due to intracranial hypertension    Oxybate     Metabolic Disorder Labs: Lab Results  Component  Value Date   HGBA1C 5.4 12/24/2022   MPG 108 12/24/2022   MPG 97 12/15/2021   No results found for: "PROLACTIN" Lab Results  Component Value Date   CHOL 183 12/24/2022   TRIG 86 12/24/2022   HDL 50 12/24/2022   CHOLHDL 3.7 12/24/2022   VLDL 11 03/19/2016   LDLCALC 114 (H) 12/24/2022   LDLCALC 104 (H) 12/15/2021   Lab Results  Component Value Date   TSH 2.20 12/24/2022   TSH 2.49 12/17/2020    Therapeutic Level Labs: No results found for: "LITHIUM" No results found for: "VALPROATE" No results found for: "CBMZ"  Current Medications: Current Outpatient Medications  Medication Sig Dispense Refill   cetirizine (ZYRTEC) 10 MG tablet Take 10  mg by mouth daily.     clindamycin (CLEOCIN T) 1 % lotion Apply topically 2 (two) times daily. 60 mL 2   EPINEPHrine 0.3 mg/0.3 mL IJ SOAJ injection Inject into the muscle.     fluticasone (FLONASE) 50 MCG/ACT nasal spray Place 2 sprays into both nostrils daily.     folic acid (FOLVITE) 1 MG tablet Take 1 mg by mouth daily.     Galcanezumab-gnlm (EMGALITY) 120 MG/ML SOAJ Inject into the skin.     hydrOXYzine (ATARAX) 10 MG tablet Take 1 tablet (10 mg total) by mouth at bedtime as needed. 90 tablet 0   ipratropium (ATROVENT) 0.03 % nasal spray ipratropium bromide 21 mcg (0.03 %) nasal spray     lamoTRIgine (LAMICTAL) 25 MG tablet Take 3 tablets (75 mg total) by mouth daily. 270 tablet 0   metroNIDAZOLE (METROGEL) 0.75 % vaginal gel INSERT 1 APPLICATORFUL VAGINALLY TWICE DAILY 70 g 0   Multiple Vitamin (MULTI-VITAMINS) TABS Take by mouth.     traZODone (DESYREL) 50 MG tablet Take 0.5-1 tablets (25-50 mg total) by mouth at bedtime as needed for sleep. 30 tablet 1   venlafaxine XR (EFFEXOR XR) 37.5 MG 24 hr capsule Take 1 capsule (37.5 mg total) by mouth daily with breakfast. Take along with 75 mg daily 90 capsule 0   venlafaxine XR (EFFEXOR XR) 75 MG 24 hr capsule Take 1 capsule (75 mg total) by mouth daily with breakfast. Take along with 37.5 mg  daily 90 capsule 0   vitamin B-12 (CYANOCOBALAMIN) 500 MCG tablet Take 500 mcg by mouth daily.     Vitamin D, Ergocalciferol, (DRISDOL) 1.25 MG (50000 UNIT) CAPS capsule Take 50,000 Units by mouth once a week.     REFRESH CELLUVISC 1 % GEL Apply 1 drop to eye 4 (four) times daily. (Patient not taking: Reported on 03/02/2023)     No current facility-administered medications for this visit.     Musculoskeletal: Strength & Muscle Tone: within normal limits Gait & Station: normal Patient leans: N/A  Psychiatric Specialty Exam: Review of Systems  Psychiatric/Behavioral:  The patient is nervous/anxious.     Blood pressure (!) 142/91, pulse (!) 112, temperature 98.1 F (36.7 C), temperature source Skin, height 5\' 3"  (1.6 m), weight 249 lb (112.9 kg).Body mass index is 44.11 kg/m.  General Appearance: Fairly Groomed  Eye Contact:  Good  Speech:  Clear and Coherent  Volume:  Normal  Mood:  Anxious  Affect:  Appropriate  Thought Process:  Goal Directed and Descriptions of Associations: Intact  Orientation:  Full (Time, Place, and Person)  Thought Content: Logical   Suicidal Thoughts:  No  Homicidal Thoughts:  No  Memory:  Immediate;   Fair Recent;   Fair Remote;   Fair  Judgement:  Fair  Insight:  Fair  Psychomotor Activity:  Normal  Concentration:  Concentration: Fair and Attention Span: Fair  Recall:  [employer] of Knowledge: Fair  Language: Fair  Akathisia:  No  Handed:  Right  AIMS (if indicated): not done  Assets:  Desire for Improvement Housing Social Support Talents/Skills Transportation  ADL's:  Intact  Cognition: WNL  Sleep:  Fair   Screenings: AIMS    Flowsheet Row Video Visit from 10/23/2021 in [facility]  AIMS Total Score 0      GAD-7    Flowsheet Row Office Visit from 03/02/2023 in [facility] Office Visit from 12/24/2022 in [facility]  Office  Visit from 06/08/2022 in [facility] Office Visit from 05/05/2022 in [facility] Office Visit from 02/10/2022 in [facility]  Total GAD-7 Score 4 0 5 10 1       PHQ2-9    Flowsheet Row Office Visit from 03/02/2023 in [facility] Office Visit from 12/24/2022 in [facility] Office Visit from 08/21/2022 in [facility] Office Visit from 06/08/2022 in [facility] Office Visit from 05/05/2022 in [facility]  PHQ-2 Total Score 0 0 0 2 1  PHQ-9 Total Score 6 0 0 7 7      Flowsheet Row Office Visit from 03/02/2023 in [facility] Video Visit from 01/27/2023 in [facility] Video Visit from 12/15/2022 in [facility]  C-SSRS RISK CATEGORY No Risk No Risk No Risk        Assessment and Plan: Olivia King is a 37 year old female, widowed, lives in [place], has a history of MDD, GAD was evaluated in office today.  Patient with recent psychosocial stressors as noted above at work as well as interpersonal relationship struggles, will benefit from psychotherapy sessions.  Continue medications as noted below.  Plan MDD in remission Venlafaxine extended release 112.5 mg p.o. daily Lamotrigine 75 mg p.o. daily  GAD-improving Venlafaxine extended release 112.5 mg p.o. daily.  Patient did not tolerate higher dosages. Hydroxyzine 10 mg p.o. nightly as needed Refer patient again for CBT-I have communicated with staff, who offered patient a psychotherapy visit however patient would like to check her schedule and agrees to reach back to schedule appointment.   Insomnia-improving Trazodone 25-50 mg p.o. nightly as  needed Continue sleep hygiene techniques    Collaboration of Care: Collaboration of Care: Referral or follow-up with counselor/therapist AEB patient encouraged to establish care with therapist.  Patient/Guardian was advised Release of Information must be obtained prior to any record release in order to collaborate their care with an outside provider. Patient/Guardian was advised if they have not already done so to contact the registration department to sign all necessary forms in order for [place] to release information regarding their care.   Consent: Patient/Guardian gives verbal consent for treatment and assignment of benefits for services provided during this visit. Patient/Guardian expressed understanding and agreed to proceed.  Follow-up in clinic in 3 months or sooner if needed.  This note was generated in part or whole with voice recognition software. Voice recognition is usually quite accurate but there are transcription errors that can and very often do occur. I apologize for any typographical errors that were not detected and corrected.     Jomarie Longs, MD 03/03/2023, 5:07 PM

## 2023-03-15 ENCOUNTER — Ambulatory Visit: Payer: Self-pay | Admitting: *Deleted

## 2023-03-15 NOTE — Telephone Encounter (Signed)
  Chief Complaint: Leg Swelling Symptoms: Both legs swelling, varies, worse when on them for a long time at work.  Frequency: 4 years Pertinent Negatives: Patient denies redness, warmth. Disposition: [] ED /[] Urgent Care (no appt availability in office) / [x] Appointment(In office/virtual)/ []  [facility]/ [] Home Care/ [] Refused Recommended Disposition /[] [facility]/ []  Follow-up with PCP Additional Notes:  States has been on several different meds. Released from cardiologist last year. States swelling goes down  some when elevated, wears compression hose. First available Appt secured for Wednesday. Care advise provided.Pt verbalizes understanding.  Reason for Disposition  [1] MODERATE leg swelling (e.g., swelling extends up to knees) AND [2] new-onset or worsening  Answer Assessment - Initial Assessment Questions 1. ONSET: "When did the swelling start?" (e.g., minutes, hours, days)     4 years 2. LOCATION: "What part of the leg is swollen?"  "Are both legs swollen or just one leg?"     Both legs 3. SEVERITY: "How bad is the swelling?" (e.g., localized; mild, moderate, severe)   - Localized: Small area of swelling localized to one leg.   - MILD pedal edema: Swelling limited to foot and ankle, pitting edema < 1/4 inch (6 mm) deep, rest and elevation eliminate most or all swelling.   - MODERATE edema: Swelling of lower leg to knee, pitting edema > 1/4 inch (6 mm) deep, rest and elevation only partially reduce swelling.   - SEVERE edema: Swelling extends above knee, facial or hand swelling present.      Varies 4. REDNESS: "Does the swelling look red or infected?"     No 5. PAIN: "Is the swelling painful to touch?" If Yes, ask: "How painful is it?"   (Scale 1-10; mild, moderate or severe)     no 6. FEVER: "Do you have a fever?" If Yes, ask: "What is it, how was it measured, and when did it start?"      No 7. CAUSE: "What do you think is causing the leg swelling?"      Unsure 8. MEDICAL HISTORY: "Do you have a history of blood clots (e.g., DVT), cancer, heart failure, kidney disease, or liver failure?"     *No Answer* 9. RECURRENT SYMPTOM: "Have you had leg swelling before?" If Yes, ask: "When was the last time?" "What happened that time?"      10. OTHER SYMPTOMS: "Do you have any other symptoms?" (e.g., chest pain, difficulty breathing)  Protocols used: Leg Swelling and Edema-A-AH

## 2023-03-17 ENCOUNTER — Ambulatory Visit (INDEPENDENT_AMBULATORY_CARE_PROVIDER_SITE_OTHER): Payer: 59 | Admitting: Physician Assistant

## 2023-03-17 ENCOUNTER — Encounter: Payer: Self-pay | Admitting: Physician Assistant

## 2023-03-17 VITALS — BP 94/60 | HR 100 | Resp 16 | Ht 63.0 in | Wt 249.3 lb

## 2023-03-17 DIAGNOSIS — G43009 Migraine without aura, not intractable, without status migrainosus: Secondary | ICD-10-CM | POA: Diagnosis not present

## 2023-03-17 DIAGNOSIS — R600 Localized edema: Secondary | ICD-10-CM | POA: Diagnosis not present

## 2023-03-17 DIAGNOSIS — G932 Benign intracranial hypertension: Secondary | ICD-10-CM | POA: Diagnosis not present

## 2023-03-17 HISTORY — DX: Benign intracranial hypertension: G93.2

## 2023-03-17 MED ORDER — TORSEMIDE 20 MG PO TABS: 20.0000 mg | ORAL_TABLET | ORAL | 0 refills | Status: DC | PRN

## 2023-03-17 NOTE — Assessment & Plan Note (Signed)
Chronic, ongoing Patient reports ongoing bilateral lower extremity edema that sometimes reaches 2-3+ pitting especially when she has been on her legs for the majority of the day She reports that she has been using compression stockings and exercising which has provided some benefit but she would prefer to have torsemide on board to assist further. Will send in prescription for torsemide 20 mg p.o. once daily every 3 days as needed for swelling.  Recommend that she uses compression stockings routinely and returns to regular exercise efforts.  She denies increased salt intake. She has appointment scheduled 03/30/2023 with Dr. Caralee Ates.  Recommend that she keep this appointment for follow-up

## 2023-03-17 NOTE — Assessment & Plan Note (Signed)
Chronic, ongoing  patient reports that she was previously worked up by [facility] for idiopathic intracranial hypertension She would like to see another neurology office for second opinion and further management as she reports her medications were stopped Referral placed per request

## 2023-03-17 NOTE — Progress Notes (Signed)
Acute Office Visit   Patient: Olivia King   DOB: [birth year]   37 y.o. Female  MRN: [id number] Visit Date: 03/17/2023  Today's healthcare provider: Oswaldo Conroy Shawnda Mauney, PA-C  Introduced myself to the patient as a Secondary school teacher and provided education on APPs in clinical practice.    Chief Complaint  Patient presents with   Leg Swelling    Bilateral on/off onset for years comes and goes   Subjective    HPI HPI     Leg Swelling    Additional comments: Bilateral on/off onset for years comes and goes      Last edited by Forde Radon, CMA on 03/17/2023 11:31 AM.      Bilateral lower leg swelling  Onset: gradual  Duration: started about 4 years ago when she was on feet for about 12 hours during shifts  She has seen vascular and Cardiology  Cardiology provided her with torsemide to assist with fluid reduction but this was discontinued  She reports she has been wearing compression stocks  She has been exercising with personal trainer - was on brief pause but is starting this back up soon  Associated symptoms: SOB when there is excess fluid    She reports when she was taking torsemide and topiramate her swelling was improved but Neurology has stopped this?   Medications: Outpatient Medications Prior to Visit  Medication Sig   cetirizine (ZYRTEC) 10 MG tablet Take 10 mg by mouth daily.   clindamycin (CLEOCIN T) 1 % lotion Apply topically 2 (two) times daily.   EPINEPHrine 0.3 mg/0.3 mL IJ SOAJ injection Inject into the muscle.   fluticasone (FLONASE) 50 MCG/ACT nasal spray Place 2 sprays into both nostrils daily.   folic acid (FOLVITE) 1 MG tablet Take 1 mg by mouth daily.   Galcanezumab-gnlm (EMGALITY) 120 MG/ML SOAJ Inject into the skin.   hydrOXYzine (ATARAX) 10 MG tablet Take 1 tablet (10 mg total) by mouth at bedtime as needed.   ipratropium (ATROVENT) 0.03 % nasal spray ipratropium bromide 21 mcg (0.03 %) nasal spray   lamoTRIgine (LAMICTAL) 25 MG tablet  Take 3 tablets (75 mg total) by mouth daily.   metroNIDAZOLE (METROGEL) 0.75 % vaginal gel INSERT 1 APPLICATORFUL VAGINALLY TWICE DAILY   Multiple Vitamin (MULTI-VITAMINS) TABS Take by mouth.   traZODone (DESYREL) 50 MG tablet Take 0.5-1 tablets (25-50 mg total) by mouth at bedtime as needed for sleep.   venlafaxine XR (EFFEXOR XR) 37.5 MG 24 hr capsule Take 1 capsule (37.5 mg total) by mouth daily with breakfast. Take along with 75 mg daily   venlafaxine XR (EFFEXOR XR) 75 MG 24 hr capsule Take 1 capsule (75 mg total) by mouth daily with breakfast. Take along with 37.5 mg daily   vitamin B-12 (CYANOCOBALAMIN) 500 MCG tablet Take 500 mcg by mouth daily.   Vitamin D, Ergocalciferol, (DRISDOL) 1.25 MG (50000 UNIT) CAPS capsule Take 50,000 Units by mouth once a week.   REFRESH CELLUVISC 1 % GEL Apply 1 drop to eye 4 (four) times daily. (Patient not taking: Reported on 03/02/2023)   No facility-administered medications prior to visit.    Review of Systems  Respiratory:  Positive for shortness of breath (with excess fluid on legs).   Cardiovascular:  Positive for leg swelling. Negative for chest pain and palpitations.  Neurological:  Positive for headaches.        Objective    BP 94/60   Pulse 100   Resp 16  Ht 5\' 3"  (1.6 m)   Wt 249 lb 4.8 oz (113.1 kg)   SpO2 99%   BMI 44.16 kg/m     Physical Exam Vitals reviewed.  Constitutional:      General: She is awake.     Appearance: Normal appearance. She is well-developed and well-groomed.  HENT:     Head: Normocephalic and atraumatic.  Cardiovascular:     Rate and Rhythm: Normal rate and regular rhythm.     Pulses: Normal pulses.          Radial pulses are 2+ on the right side and 2+ on the left side.     Heart sounds: Normal heart sounds. No murmur heard.    No friction rub. No gallop.  Pulmonary:     Effort: Pulmonary effort is normal.     Breath sounds: Normal breath sounds. No decreased air movement. No decreased breath  sounds, wheezing, rhonchi or rales.  Musculoskeletal:     Right lower leg: 1+ Pitting Edema present.     Left lower leg: 1+ Pitting Edema present.  Skin:    General: Skin is warm and dry.  Neurological:     General: No focal deficit present.     Mental Status: She is alert and oriented to person, place, and time.  Psychiatric:        Mood and Affect: Mood normal.        Behavior: Behavior normal. Behavior is cooperative.        Thought Content: Thought content normal.        Judgment: Judgment normal.       No results found for any visits on 03/17/23.  Assessment & Plan      No follow-ups on file.       Problem List Items Addressed This Visit       Cardiovascular and Mediastinum   Migraine without aura   Relevant Medications   torsemide (DEMADEX) 20 MG tablet   Other Relevant Orders   Ambulatory referral to Neurology     Nervous and Auditory   Idiopathic intracranial hypertension    Chronic, ongoing  patient reports that she was previously worked up by [facility] for idiopathic intracranial hypertension She would like to see another neurology office for second opinion and further management as she reports her medications were stopped Referral placed per request      Relevant Orders   Ambulatory referral to Neurology     Other   Bilateral edema of lower extremity - Primary    Chronic, ongoing Patient reports ongoing bilateral lower extremity edema that sometimes reaches 2-3+ pitting especially when she has been on her legs for the majority of the day She reports that she has been using compression stockings and exercising which has provided some benefit but she would prefer to have torsemide on board to assist further. Will send in prescription for torsemide 20 mg p.o. once daily every 3 days as needed for swelling.  Recommend that she uses compression stockings routinely and returns to regular exercise efforts.  She denies increased salt intake. She has  appointment scheduled 03/30/2023 with Dr. Caralee Ates.  Recommend that she keep this appointment for follow-up      Relevant Medications   torsemide (DEMADEX) 20 MG tablet     No follow-ups on file.   I, Lamanda Rudder E Shirleymae Hauth, PA-C, have reviewed all documentation for this visit. The documentation on 03/17/23 for the exam, diagnosis, procedures, and orders are all accurate and complete.  Jacquelin Hawking, MHS, PA-C [facility]

## 2023-03-30 ENCOUNTER — Ambulatory Visit: Admitting: Internal Medicine

## 2023-04-02 ENCOUNTER — Ambulatory Visit (INDEPENDENT_AMBULATORY_CARE_PROVIDER_SITE_OTHER): Payer: 59

## 2023-04-02 ENCOUNTER — Ambulatory Visit (INDEPENDENT_AMBULATORY_CARE_PROVIDER_SITE_OTHER): Payer: 59 | Admitting: Podiatry

## 2023-04-02 ENCOUNTER — Encounter: Payer: Self-pay | Admitting: Podiatry

## 2023-04-02 VITALS — Ht 63.0 in | Wt 250.0 lb

## 2023-04-02 DIAGNOSIS — R52 Pain, unspecified: Secondary | ICD-10-CM

## 2023-04-02 DIAGNOSIS — M7752 Other enthesopathy of left foot: Secondary | ICD-10-CM | POA: Diagnosis not present

## 2023-04-02 DIAGNOSIS — M778 Other enthesopathies, not elsewhere classified: Secondary | ICD-10-CM | POA: Diagnosis not present

## 2023-04-02 MED ORDER — BETAMETHASONE SOD PHOS & ACET 6 (3-3) MG/ML IJ SUSP
3.0000 mg | Freq: Once | INTRAMUSCULAR | Status: DC
Start: 2023-04-02 — End: 2023-12-16

## 2023-04-02 MED ORDER — MELOXICAM 15 MG PO TABS: 15.0000 mg | ORAL_TABLET | Freq: Every day | ORAL | 1 refills | Status: AC

## 2023-04-02 NOTE — Progress Notes (Signed)
   Chief Complaint  Patient presents with   Foot Pain    Patient is here for left foot pain, no sure how she injured it. Sharp pains with weight bearing and prolonged standing Hurts on outside of foot, hard to wear shoes and walk    HPI: 37 y.o. female presenting today for new complaint of pain and tenderness associated to the dorsal lateral aspect of the left foot.  Onset about 1 month ago.  Patient states that her feet have been swelling at work and she laced her tennis shoes too tight.  After that she began to feel pain and tenderness associated to the dorsal lateral aspect of the left foot.  She has not done anything for treatment  Past Medical History:  Diagnosis Date   Cigarette smoker    COVID-19 virus detected 03/21/2019   Family history of pancreatic cancer 08/2020   MyRisk neg except SDHA VUS; IBIS=9.2%/riskscore=7.3%   IUD contraception 02/06/2016   Placed January 23, 2016 by Althea Grimmer, PA [facility]   Major depressive disorder, recurrent (HCC) 08/19/2015   zoloft made her cry; cymbalta made her too sleepy   Migraine headache     Past Surgical History:  Procedure Laterality Date   CESAREAN SECTION  2004 &2012   contraceptive implant      Allergies  Allergen Reactions   Sumatriptan Anaphylaxis    Throat and tongue swelling  Throat and tongue swelling  Throat and tongue swelling    Penicillins Rash   Vortioxetine Other (See Comments)    Tingling hands and blurred vision  Tingling hands and blurred vision  Tingling hands and blurred vision    Doxycycline     Unable to take it currently due to intracranial hypertension    Oxybate      Physical Exam: General: The patient is alert and oriented x3 in no acute distress.  Dermatology: Skin is warm, dry and supple bilateral lower extremities.   Vascular: Palpable pedal pulses bilaterally. Capillary refill within normal limits.  Chronic mild edema noted bilateral lower extremities.  No  erythema.  Neurological: Grossly intact via light touch  Musculoskeletal Exam: No pedal deformities noted.  There is some tenderness to palpation along the dorsal lateral extensor tendons of the left foot.  Tenderness with forced dorsiflexion as well  Radiographic Exam LT foot 04/02/2023:  Normal osseous mineralization. Joint spaces preserved.  No fractures or osseous irregularities noted.  Impression: Negative  Assessment/Plan of Care: 1.  Extensor tendinitis left  -Patient evaluated.  X-rays reviewed -Injection of 0.5 cc Celestone Soluspan injected along the dorsal lateral aspect of the left foot -Prescription for meloxicam 15 mg daily -Continue compression hose bilateral -Return to clinic as needed  *works at [employer]       Felecia Shelling, DPM [facility]  Dr. Felecia Shelling, DPM    [address] [place], [place] [address]                Office [phone]  Fax [phone]

## 2023-04-13 ENCOUNTER — Other Ambulatory Visit: Payer: Self-pay | Admitting: Family Medicine

## 2023-04-13 NOTE — Telephone Encounter (Signed)
Requested medication (s) are due for refill today - unsure  Requested medication (s) are on the active medication list -yes  Future visit scheduled -no  Last refill: 02/07/23 70g  Notes to clinic: off protocol- provider review   Requested Prescriptions  Pending Prescriptions Disp Refills   metroNIDAZOLE (METROGEL) 0.75 % vaginal gel [Pharmacy Med Name: METRONIDAZOLE 0.75% VAGINAL GEL 70G] 70 g 0    Sig: INSERT 1 APPLICATORFUL VAGINALLY TWICE DAILY     Off-Protocol Failed - 04/13/2023  9:13 AM      Failed - Medication not assigned to a protocol, review manually.      Passed - Valid encounter within last 12 months    Recent Outpatient Visits           3 weeks ago Bilateral edema of lower extremity   [facility] Mecum, Oswaldo Conroy, PA-C   3 months ago Well adult exam   [facility] Alba Cory, MD   7 months ago Erroneous encounter - disregard   [facility] Margarita Mail, DO   1 year ago Idiopathic intracranial hypertension   [facility] Alba Cory, MD   1 year ago Tinnitus of right ear   [facility] Alba Cory, MD                 Requested Prescriptions  Pending Prescriptions Disp Refills   metroNIDAZOLE (METROGEL) 0.75 % vaginal gel [Pharmacy Med Name: METRONIDAZOLE 0.75% VAGINAL GEL 70G] 70 g 0    Sig: INSERT 1 APPLICATORFUL VAGINALLY TWICE DAILY     Off-Protocol Failed - 04/13/2023  9:13 AM      Failed - Medication not assigned to a protocol, review manually.      Passed - Valid encounter within last 12 months    Recent Outpatient Visits           3 weeks ago Bilateral edema of lower extremity   [facility] Mecum, Oswaldo Conroy, PA-C   3 months ago Well adult exam   [facility] Alba Cory, MD   7 months ago Erroneous encounter - disregard   [facility] Margarita Mail, DO   1 year ago Idiopathic intracranial hypertension   [facility] Alba Cory, MD   1 year ago Tinnitus of right ear   [facility] Alba Cory, MD

## 2023-04-15 ENCOUNTER — Encounter: Payer: Self-pay | Admitting: Family Medicine

## 2023-04-15 ENCOUNTER — Other Ambulatory Visit: Payer: Self-pay | Admitting: Family Medicine

## 2023-04-15 MED ORDER — METRONIDAZOLE 0.75 % VA GEL: Freq: Two times a day (BID) | VAGINAL | 0 refills | Status: DC

## 2023-05-05 ENCOUNTER — Ambulatory Visit (INDEPENDENT_AMBULATORY_CARE_PROVIDER_SITE_OTHER): Payer: Commercial Managed Care - PPO | Admitting: Psychiatry

## 2023-05-05 ENCOUNTER — Encounter: Payer: Self-pay | Admitting: Psychiatry

## 2023-05-05 VITALS — BP 135/83 | HR 109 | Temp 97.5°F | Ht 63.0 in | Wt 248.6 lb

## 2023-05-05 DIAGNOSIS — F3342 Major depressive disorder, recurrent, in full remission: Secondary | ICD-10-CM

## 2023-05-05 DIAGNOSIS — F3341 Major depressive disorder, recurrent, in partial remission: Secondary | ICD-10-CM | POA: Diagnosis not present

## 2023-05-05 DIAGNOSIS — G4701 Insomnia due to medical condition: Secondary | ICD-10-CM

## 2023-05-05 DIAGNOSIS — F411 Generalized anxiety disorder: Secondary | ICD-10-CM

## 2023-05-05 MED ORDER — VENLAFAXINE HCL ER 75 MG PO CP24
75.0000 mg | ORAL_CAPSULE | Freq: Every day | ORAL | 0 refills | Status: DC
Start: 2023-05-05 — End: 2023-08-11

## 2023-05-05 MED ORDER — VENLAFAXINE HCL ER 37.5 MG PO CP24
37.5000 mg | ORAL_CAPSULE | Freq: Every day | ORAL | 0 refills | Status: DC
Start: 2023-05-05 — End: 2023-08-11

## 2023-05-05 MED ORDER — LAMOTRIGINE 25 MG PO TABS
75.0000 mg | ORAL_TABLET | Freq: Every day | ORAL | 0 refills | Status: DC
Start: 2023-05-05 — End: 2023-08-11

## 2023-05-05 MED ORDER — HYDROXYZINE HCL 10 MG PO TABS: 10.0000 mg | ORAL_TABLET | Freq: Every evening | ORAL | 0 refills | Status: DC | PRN

## 2023-05-05 NOTE — Progress Notes (Signed)
BH MD OP Progress Note  05/05/2023 5:01 PM Olivia King  MRN:  [id number]  Chief Complaint:  Chief Complaint  Patient presents with   Follow-up   Anxiety   Depression   Medication Refill   HPI: Olivia King is a 38 year old female, employed, lives in [place], widowed, has a history of MDD, GAD, tobacco use disorder, insomnia, intracranial hypertension was evaluated in office today.  The patient has been managing her condition with venlafaxine 112.5mg  and Lamictal 75mg . She reports that her anxiety has been improving, but she has been experiencing increased irritability and annoyance, particularly in social situations.  This is usually around her friends and likely due to feeling overwhelmed by their problems.  She has been setting boundaries with them.  She however currently denies any significant depression symptoms.  Reports anxiety is manageable.  The patient also reports a recent change in her medication regimen, with the reintroduction of topiramate and Emgality.  She currently denies any side effects.  Aware of interaction between topiramate and Lamictal.  Patient provided education.  In addition to her primary condition, the patient also mentions a recent change in her work environment, with her supervisor transferring to another clinic. She describes her current work situation as less stressful than before, but she still expresses some dissatisfaction with a Radio broadcast assistant.  The patient's sleep patterns appear to be normal, and she is not currently taking trazodone.   Patient agreeable to being scheduled for therapy with in-house therapist.   Visit Diagnosis:    ICD-10-CM   1. MDD (major depressive disorder), recurrent, in full remission (HCC)  F33.42 venlafaxine XR (EFFEXOR XR) 75 MG 24 hr capsule    venlafaxine XR (EFFEXOR XR) 37.5 MG 24 hr capsule    2. GAD (generalized anxiety disorder)  F41.1 hydrOXYzine (ATARAX) 10 MG tablet    3. Insomnia due to medical condition   G47.01    Anxiety             Past Psychiatric History: I have reviewed past psychiatric history from progress note on 10/10/2019.  Past trials of medications like Lexapro, Vyvanse, Abilify, Paxil, Zoloft, Cymbalta.  Past Medical History:  Past Medical History:  Diagnosis Date   Cigarette smoker    COVID-19 virus detected 03/21/2019   Family history of pancreatic cancer 08/2020   MyRisk neg except SDHA VUS; IBIS=9.2%/riskscore=7.3%   IUD contraception 02/06/2016   Placed January 23, 2016 by Althea Grimmer, PA [facility]   Major depressive disorder, recurrent (HCC) 08/19/2015   zoloft made her cry; cymbalta made her too sleepy   Migraine headache     Past Surgical History:  Procedure Laterality Date   CESAREAN SECTION  2004 &2012   contraceptive implant      Family Psychiatric History: I have reviewed family psychiatric history from progress note on 10/10/2019.  Family History:  Family History  Problem Relation Age of Onset   Hypertension Mother    Pancreatic cancer Mother 53   Hypertension Brother    Aneurysm Maternal Grandmother    Heart attack Maternal Grandfather    Hypertension Paternal Grandmother    Heart failure Sister     Social History: I have reviewed social history from progress note on 10/10/2019. Social History   Socioeconomic History   Marital status: Widowed    Spouse name: Not on file   Number of children: 2   Years of education: Not on file   Highest education level: Associate degree: occupational, Scientist, product/process development, or vocational program  Occupational History  Not on file  Tobacco Use   Smoking status: Former    Current packs/day: 0.00    Types: Cigarettes    Start date: 03/05/2011    Quit date: 06/06/2022    Years since quitting: 0.9   Smokeless tobacco: Never  Vaping Use   Vaping status: Never Used  Substance and Sexual Activity   Alcohol use: Yes    Alcohol/week: 0.0 standard drinks of alcohol   Drug use: No   Sexual activity: Yes     Partners: Male    Birth control/protection: I.U.D.    Comment: Patient has liletta IUD  Other Topics Concern   Not on file  Social History Narrative   She is a widow, he died 2009/06/05 while he was deployed.    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   Social Drivers of Health   Financial Resource Strain: Low Risk  (12/24/2022)   Overall Financial Resource Strain (CARDIA)    Difficulty of Paying Living Expenses: Not hard at all  Food Insecurity: No Food Insecurity (12/24/2022)   Hunger Vital Sign    Worried About Running Out of Food in the Last Year: Never true    Ran Out of Food in the Last Year: Never true  Transportation Needs: No Transportation Needs (12/24/2022)   PRAPARE - Administrator, Civil Service (Medical): No    Lack of Transportation (Non-Medical): No  Physical Activity: Sufficiently Active (12/24/2022)   Exercise Vital Sign    Days of Exercise per Week: 3 days    Minutes of Exercise per Session: 60 min  Stress: No Stress Concern Present (12/24/2022)   [employer] of Occupational Health - Occupational Stress Questionnaire    Feeling of Stress : Only a little  Social Connections: Moderately Integrated (12/24/2022)   Social Connection and Isolation Panel [NHANES]    Frequency of Communication with Friends and Family: More than three times a week    Frequency of Social Gatherings with Friends and Family: More than three times a week    Attends Religious Services: More than 4 times per year    Active Member of [employer] or Organizations: Yes    Attends Banker Meetings: More than 4 times per year    Marital Status: Widowed    Allergies:  Allergies  Allergen Reactions   Sumatriptan Anaphylaxis    Throat and tongue swelling  Throat and tongue swelling  Throat and tongue swelling    Penicillins Rash   Vortioxetine Other (See Comments)    Tingling hands and blurred vision  Tingling hands and blurred vision  Tingling hands and blurred vision     Doxycycline     Unable to take it currently due to intracranial hypertension    Oxybate     Metabolic Disorder Labs: Lab Results  Component Value Date   HGBA1C 5.4 12/24/2022   MPG 108 12/24/2022   MPG 97 12/15/2021   No results found for: "PROLACTIN" Lab Results  Component Value Date   CHOL 183 12/24/2022   TRIG 86 12/24/2022   HDL 50 12/24/2022   CHOLHDL 3.7 12/24/2022   VLDL 11 03/19/2016   LDLCALC 114 (H) 12/24/2022   LDLCALC 104 (H) 12/15/2021   Lab Results  Component Value Date   TSH 2.20 12/24/2022   TSH 2.49 12/17/2020    Therapeutic Level Labs: No results found for: "LITHIUM" No results found for: "VALPROATE" No results found for: "CBMZ"  Current Medications: Current Outpatient Medications  Medication Sig Dispense Refill  cetirizine (ZYRTEC) 10 MG tablet Take 10 mg by mouth daily.     clindamycin (CLEOCIN T) 1 % lotion Apply topically 2 (two) times daily. 60 mL 2   EPINEPHrine 0.3 mg/0.3 mL IJ SOAJ injection Inject into the muscle.     fluticasone (FLONASE) 50 MCG/ACT nasal spray Place 2 sprays into both nostrils daily.     folic acid (FOLVITE) 1 MG tablet Take 1 mg by mouth daily.     Galcanezumab-gnlm (EMGALITY) 120 MG/ML SOAJ Inject into the skin.     ipratropium (ATROVENT) 0.03 % nasal spray ipratropium bromide 21 mcg (0.03 %) nasal spray     meloxicam (MOBIC) 15 MG tablet Take 1 tablet (15 mg total) by mouth daily. 60 tablet 1   metroNIDAZOLE (METROGEL) 0.75 % vaginal gel Place vaginally 2 (two) times daily. 70 g 0   Multiple Vitamin (MULTI-VITAMINS) TABS Take by mouth.     REFRESH CELLUVISC 1 % GEL Apply 1 drop to eye 4 (four) times daily.     topiramate (TOPAMAX) 50 MG tablet Take 50 mg by mouth 2 (two) times daily.     torsemide (DEMADEX) 20 MG tablet Take 1 tablet (20 mg total) by mouth every three (3) days as needed. 30 tablet 0   traZODone (DESYREL) 50 MG tablet Take 0.5-1 tablets (25-50 mg total) by mouth at bedtime as needed for sleep. 30  tablet 1   vitamin B-12 (CYANOCOBALAMIN) 500 MCG tablet Take 500 mcg by mouth daily.     Vitamin D, Ergocalciferol, (DRISDOL) 1.25 MG (50000 UNIT) CAPS capsule Take 50,000 Units by mouth once a week.     hydrOXYzine (ATARAX) 10 MG tablet Take 1 tablet (10 mg total) by mouth at bedtime as needed. 90 tablet 0   lamoTRIgine (LAMICTAL) 25 MG tablet Take 3 tablets (75 mg total) by mouth daily. 270 tablet 0   venlafaxine XR (EFFEXOR XR) 37.5 MG 24 hr capsule Take 1 capsule (37.5 mg total) by mouth daily with breakfast. Take along with 75 mg daily 90 capsule 0   venlafaxine XR (EFFEXOR XR) 75 MG 24 hr capsule Take 1 capsule (75 mg total) by mouth daily with breakfast. Take along with 37.5 mg daily 90 capsule 0   Current Facility-Administered Medications  Medication Dose Route Frequency Provider Last Rate Last Admin   betamethasone acetate-betamethasone sodium phosphate (CELESTONE) injection 3 mg  3 mg Intra-articular Once          Musculoskeletal: Strength & Muscle Tone: within normal limits Gait & Station: normal Patient leans: N/A  Psychiatric Specialty Exam: Review of Systems  Psychiatric/Behavioral:  The patient is nervous/anxious.     Blood pressure 135/83, pulse (!) 109, temperature (!) 97.5 F (36.4 C), temperature source Skin, height 5\' 3"  (1.6 m), weight 248 lb 9.6 oz (112.8 kg).Body mass index is 44.04 kg/m.  General Appearance: Fairly Groomed  Eye Contact:  Fair  Speech:  Normal Rate  Volume:  Normal  Mood:  Anxious situational  Affect:  Appropriate  Thought Process:  Goal Directed and Descriptions of Associations: Intact  Orientation:  Full (Time, Place, and Person)  Thought Content: Logical   Suicidal Thoughts:  No  Homicidal Thoughts:  No  Memory:  Immediate;   Fair Recent;   Fair Remote;   Fair  Judgement:  Fair  Insight:  Fair  Psychomotor Activity:  Normal  Concentration:  Concentration: Fair and Attention Span: Fair  Recall:  [employer] of Knowledge: Fair   Language: Fair  Akathisia:  No  Handed:  Right  AIMS (if indicated): not done  Assets:  Desire for Improvement Housing Social Support Talents/Skills Transportation  ADL's:  Intact  Cognition: WNL  Sleep:  Fair   Screenings: AIMS    Flowsheet Row Video Visit from [facility]/23/2021 in [facility]  AIMS Total Score 0      GAD-7    Flowsheet Row Office Visit from 05/05/2023 in [facility] Office Visit from 03[facility]17/2023 in [facility] Office Visit from 03/02/2023 in [facility] Office Visit from 12/24/2022 in [facility] Office Visit from 06/08/2022 in [facility]  Total GAD-7 Score 5 7 4  0 5      PHQ2-9    Flowsheet Row Office Visit from 05/05/2023 in [facility] Office Visit from 03/17/2023 in [facility] Office Visit from 03/02/2023 in [facility] Office Visit from 12/24/2022 in [facility] Office Visit from 08/21/2022 in [facility]  PHQ-2 Total Score 0 2 0 0 0  PHQ-9 Total Score -- 4 6 0 0      Flowsheet Row Office Visit from 05/05/2023 in [facility] Office Visit from 03/02/2023 in [facility] Video Visit from 01/27/2023 in [facility]  C-SSRS RISK CATEGORY No Risk No Risk No Risk        Assessment and Plan: Olivia King is a 38 year old female, widowed, lives in [place], has a history of MDD, GAD was evaluated in office today.  Patient with situational stressors, interpersonal relationship struggles, agrees to start psychotherapy, discussed assessment and plan as noted  below.  MDD-remission Generalized Anxiety Disorder-improving Reports improvement in anxiety symptoms but still experiences irritability and avoidance of social interactions due to low stress tolerance.  Denies any depression symptoms.  No suicidal ideation. Current medications include venlafaxine 112.5 mg and Lamictal 75 mg. Discussed the importance of socializing and setting boundaries. - Continue venlafaxine 112.5 mg - Continue Lamictal 75 mg - Continue hydroxyzine 10 mg p.o. nightly as needed - Provided education about drug to drug interaction between Lamictal and topiramate, topiramate side effects can be increased when coadministered with Lamictal. - Schedule therapy appointment with in-house therapist, communicated with staff. - Follow up in three months, next appointment can be by video  Insomnia-improving Reports good sleep, not currently taking trazodone. -Trazodone 25-50 mg at bedtime as needed.  Follow-up - Schedule therapy appointment  - Follow up in three months, next appointment can be by video.  Collaboration of Care: Collaboration of Care: Referral or follow-up with counselor/therapist AEB patient encouraged to establish care with therapist.  Patient/Guardian was advised Release of Information must be obtained prior to any record release in order to collaborate their care with an outside provider. Patient/Guardian was advised if they have not already done so to contact the registration department to sign all necessary forms in order for [place] to release information regarding their care.   Consent: Patient/Guardian gives verbal consent for treatment and assignment of benefits for services provided during this visit. Patient/Guardian expressed understanding and agreed to proceed.   This note was generated in part or whole with voice recognition software. Voice recognition is usually quite accurate but there are transcription errors that can and very often do occur. I apologize  for any  typographical errors that were not detected and corrected.    Jomarie Longs, MD 05/06/2023, 12:57 PM

## 2023-05-05 NOTE — Progress Notes (Signed)
 Ref Provider: Sowels, Krichna  PCP: Sowles, Krichna F, MD  Assessment and Plan:   In most patients we give written parts of assessment and plan to patient under Patient Instructions/After Visit Summary. So some parts are directed to patient.  Dear Olivia King, It was our pleasure to participate in your care. We have typed up brief summary of what we discussed.  1. Ideopathic Intracranial Hypertension: Right pulsatile tinnitus which gets better with right jugular compression and worse with valsalva in a patient who has gained weight, initially concerning for right venous sinus stenosis - however, patient has bilateral narrowed transverse sinuses bilaterally + mildly prominent optic nerve sheaths + partially empty sella, on chronic Doxycycline  - concerning for intracranial hypertension. Patient had Lumbar Puncture on 09/09/2021 with opening pressure of 21 cm H2O and reports some improvement of symptoms. Status post blood patch with Dr. Margart Richards Continue to follow with ophthalmology. - Previously on Torsemide  as needed for lower extremity edema, not currently taking  - Previously on Spironolactone for hidradenitis suppurativa, not currently taking  - Discussed repeat Lumbar Puncture as future treatment option, will avoid if possible.   - Restart topiramate  50 mg by mouth at night for 2 weeks then 100 mg by mouth at night for 2 weeks then 150 mg by mouth at night   - continue Emgality monthly injections  Galcanezumab is a calcitonin gene-related peptide (CGRP) antagonist indicated in the preventive treatment of migraines in adults. Galcanezumab / Emgality 120 mg injector pen; Loading dose: Inject 240 mg subcutaneous for the first month. Maintenance dose: Inject 120 mg subcutaneous monthly. Subcutaneous injections can be given in the abdomen, thigh, upper arm, or buttock. Hypersensitive reactions and injection site reactions can happen. Further information can be found at [email or web address] (watch  videos on injection - if you are uncomfortable, make a nurse appointment to show you how to inject medication)  I reviewed patient's imaging report and actual images, on my read I agree with the radiologist report. Reviewed 01/02/2023 MRI Brain With and Without Contrast  IMPRESSION:  Unchanged partially empty sella, otherwise unremarkable appearance  of the brain. No acute abnormality.   - Do not recommend restarting Doxycycline  at this time   Idiopathic Intracranial Hypertension (IIH) IIH confirmed by lumbar puncture (opening pressure 21 cm H2O) and imaging (empty sella). Reports sharp headaches exacerbated by coughing/sneezing. Previous acetazolamide discontinued due to kidney spasms. Discussed topiramate , including side effects (weight loss, taste changes, tingling/numbness in fingers). Prefers to retry topiramate . Advised reducing sugar intake to alleviate inflammation and symptoms. - Start topiramate  25 mg at night for 2 weeks - Increase to 50 mg for 2 weeks - Increase to 100 mg for 2 weeks - Increase to 150 mg thereafter - Continue Emgality for migraine prophylaxis - Advise reduction of sugar intake, particularly from sweet tea  2. History of migraine with cognitive aura - last migraine aura in about 2018 Migraine vertigo with improved frequency but persistent sharp headaches. Emgality used for two months without significant improvement; continuation recommended for 3-4 months to assess efficacy.  - Continue Emgality as prescribed - Topiramate  as above  - Taking Venlafaxine  (Effexor ) 75 mg  3. Vertigo following COVID 11/2020 - improved, status post ENT evaluation   4. Hypovitaminosis - Vitamin B12 + Vitamin D   Vitamin B12 (05/19/2021) - 251  Vitamin D  (05/19/2021) - 11.5 Continue supplementing - started early April 2023   5. Hidradenitis Suppurativa - - Doxycycline  discontinued: do not recommend starting at this time  -  Spironolactone discontinued   6. Snoring -  Offered home  sleep test   7. Abnormal eye movement - ?partial third nerve palsy  MRI Brain as above    3 - 4 mo, Kaitlin Paich, PA-C  Return in about 4 months (around 09/02/2023) for with Kaitlin Paich, PA-C.  This note has been created using automated tools and reviewed for accuracy by [facility]. I spent a total of 32 minutes in both face-to-face and non-face-to-face activities, excluding procedures performed, for this visit on the date of this encounter.  Interim History date 05/05/2023   Olivia King is a 38 y.o. female here for treatment and evaluation of Headache and Blurred Vision, unaccompanied.   Olivia King last visit was on 12/31/2022  Patient reports that when she coughs, sneezes, or coughs, she gets a sharp pain in her head that lasts for about 10-25 seconds. Location varies. Headaches have been better but she is still having them occasionally. Still associated with photophobia, phonophobia, nausea, vomiting.. Did on Emgality injection but did not do this months. She continues to get dizzy when she lays down. She continues to have a pulsating in her ear that is like a whooshing rather than a beating. This is not constant and the intensity will fluctuate. Continues to have pain behind her eyes. Some blurry vision. She has glasses but she says her prescription is wrong.    History of Present Illness Olivia King, a 38 year old with idiopathic recurrent hypertension and migraine vertigo, presents with a chief complaint of sharp headache pain that is triggered by coughing or sneezing. The patient has been trialing Emgality for two months without noticeable improvement. She is scheduled to pick up her third dose soon.  The patient distinguishes between her migraines and other headaches, noting that the migraines have decreased in frequency. She has a history of idiopathic intracranial hypertension, confirmed by a previous lumbar puncture with an opening pressure of 21 and imaging showing an  empty sella. However, the lumbar puncture did not result in significant headache improvement and required a subsequent epidural blood patch.  Previously, the patient was on acetazolamide (Diamox), but it was discontinued due to kidney spasms. She has also previously taken topiramate , which she is open to trying again. The patient has a history of taking Wegovy  and Ozempic , but these were discontinued due to insurance issues.  The patient has also been experiencing changes in vision, feeling as though her eyes are crossing, though this has not been confirmed by others. She has seen an eye doctor, who reported normal findings. The patient does not report any aches, pains, or excessive sugar consumption.   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 Wiliam.  Ideopathic Intracranial Hypertension: Right pulsatile tinnitus which gets better with right jugular compression and worse with valsalva in a patient who has gained weight, initially concerning for right venous sinus stenosis - however, patient has bilateral narrowed transverse sinuses bilaterally + mildly prominent optic nerve sheaths + partially empty sella, on chronic Doxycycline  - concerning for intracranial hypertension. Patient had Lumbar Puncture on 09/09/2021 with opening pressure of 21 cm H2O and reports some improvement of symptoms. Status post blood patch with Dr. Margart Richards - following ophthalmology: Patient reports that she is here for constant beating pulse in right ear. Patient reports pressing on her jugular vein makes it go away. She  reports bearing down makes it worse. She reports that it is annoying and disruptive, especially when she is trying to sleep.  Patient denies associated headaches, taking any new medication, or any major life event/stressful situation. Patient reports she recently gained a  little above 40 pounds.   Reports COVID in 03/2020 and 11/2020 in a vaccinated but not boosted patient. She reports she had vertigo following her second COVID infection, which has improved. She saw ENT for this.  Patient reports history of migraines and she reports memory loss when she has them. She reports she had to pull over and she couldn't use her phone or remember where she was. It passed after about an hour. Reports flavored water, diet sodas, artifical sweeteners are a migraine trigger.  Patient presents for urgent work-in on 07/23/2021 for headache lasting 4 days. Describes as right sided head pressure/periorbital eye pain. Reports word-finding difficulty, transposing words, forgetting routine tasks (e.g., packing lunch items). Headache onset 07/18/2021, described as as pressure headache, not as severe as migraine, without vomiting, photophobia, phonophobia. Noticed word finding difficulty 2 - 3  Weeks ago, middle of March 2023, memory loss, 2 weeks. Does not feel like increased topiramate  has caused cognitive side effects. Has history of migraine with cognitive aura. Denies other focal neurologic deficit/symptom. No new neurologic symptoms otherwise.   MRI/MRV remarkable for partially empty sella, with mildly prominent optic nerve sheaths and narrow distal transverse sinuses bilaterally out evidence of venous sinus thrombosis. Continues to experience tinnitus.   MRI Brain/MRV Head With and Without Contrast 05/27/2021: Partially empty sella, mildly prominent optic nerve sheaths and narrowed distal transverse sinuses bilaterally. While nonspecific, the constellation of findings can be seen with idiopathic intracranial hypertension which can cause pulsatile tinnitus. Consider lumbar puncture with opening pressure for further  evaluation. Otherwise, no evidence of acute intracranial abnormality. No evidence of dural venous sinus thrombosis.    Lumbar Puncture for opening pressure 09/09/2021:  Technically successful epidural blood patch at L2-L3 using fluoroscopic guidance.   IgG, CSF 0.0 - 6.7 mg/dL 2.9  Albumin CSF-mCnc 7 - 29 mg/dL 14  IgG (Immunoglobin G), Serum 586 - 1602 mg/dL 8,535  Albumin 3.8 - 4.8 g/dL 3.9  IgG/Alb Ratio, CSF 0.00 - 0.25 0.21  CSF IgG Index 0.0 - 0.7 0.6   Color, CSF COLORLESS COLORLESS  Appearance, CSF CLEAR CLEAR Abnormal   RBC Count, CSF 0 - 3 /cu mm 30 High   WBC, CSF 0 - 5 /cu mm 2  Segmented Neutrophils-CSF % 3  Lymphs, CSF % 84  Monocyte-Macrophage-Spinal Fluid % 13  Eosinophils, CSF % 0   Medications: Topiramate , Torsemide  as needed for lower extremity edema, Spironolactone for hidradenitis suppurativa, diamox, Emgality  History of migraine with cognitive aura - last migraine aura in about 2018: Suspect recent episode of cognitive fog/memory difficulty to be representative of migraine with cognitive aura without headache versus side effects of Topiramate .  Medications:  Preventative: Topiramate , Venlafaxine , Diamox, Lamotrigine , Spironolactone, Torsemide , Emgality Rescue: tylenol , Excedrin migraine   Vertigo following COVID 11/2020 - improved, status post ENT evaluation.  Hypovitaminosis - Vitamin B12 + Vitamin D : Continue supplementing - started early April 2023. Vitamin B12 (05/19/2021) - 251 Vitamin D  (05/19/2021) - 11.5  Family history of aneurysm MR MRA head without contrast 08/17/2012: Normal  Hidradenitis Suppurativa: Taking Spironolactone. Previously taking Doxycycline  (discontinued).  Physical Exam   Vitals Vitals:   05/05/23 1536  BP: 118/78  Pulse: 106  SpO2: 95%  Weight: (!) 112.9 kg (249 lb)  Height: 160 cm (5' 3)       Body mass index is 44.11 kg/m.  (Some of the exam changes noted are from previous clinical observations)  General Exam   Neurological Exam   Medications: Current Outpatient Medications on File Prior to Visit  Medication Sig Dispense Refill  . carboxymethylcellulose (REFRESH  CELLUVISC) 1 % ophthalmic dropperette Apply 1 drop to eye    . cetirizine (ZYRTEC) 10 MG tablet Take 10 mg by mouth once daily    . cholecalciferol (VITAMIN D3) 1000 unit tablet Take 1,000 Units by mouth    . clindamycin  (CLEOCIN  T) 1 % lotion Apply topically 2 (two) times daily    . cyanocobalamin  (VITAMIN B12) 1000 MCG tablet Take 1,000 mcg by mouth once daily    . EPINEPHrine (EPIPEN) 0.3 mg/0.3 mL auto-injector Inject into the muscle    . fluticasone  propionate (FLONASE ) 50 mcg/actuation nasal spray SHAKE LIQUID AND USE 2 SPRAYS IN EACH NOSTRIL DAILY    . folic acid  (FOLVITE ) 1 MG tablet Take 1 mg by mouth once daily    . hydrOXYzine  HCL (ATARAX ) 10 MG tablet hydroxyzine  HCl 10 mg tablet    . ibuprofen  (MOTRIN ) 800 MG tablet     . lamoTRIgine  (LAMICTAL ) 25 MG tablet Take 50 mg by mouth once daily    . meloxicam  (MOBIC ) 15 MG tablet Take 1 tablet by mouth once daily    . metroNIDAZOLE  (METROGEL ) 0.75 % (37.5mg /5 gram) vaginal gel Place vaginally 2 (two) times daily    . multivitamin tablet Take 1 tablet by mouth once daily    . TORsemide  (DEMADEX ) 20 MG tablet Take 20 mg by mouth    . traZODone  (DESYREL ) 50 MG tablet Take by mouth    . triamcinolone  0.1 % cream APPLY TOPICALLY TO THE AFFECTED AREA TWICE DAILY FOR SPOT TREATMENT    . venlafaxine  (EFFEXOR -XR) 37.5 MG XR capsule Take 37.5 mg by mouth once daily    . venlafaxine  (EFFEXOR -XR) 75 MG XR capsule Take by mouth    . EMGALITY PEN 120 mg/mL PnIj Inject subcutaneously (Patient not taking: Reported on 05/05/2023)    . ergocalciferol , vitamin D2, 1,250 mcg (50,000 unit) capsule Take 1 capsule (50,000 Units total) by mouth once a week for 60 days Take 1000 IU Vit D at the completion of weekly course. 8 capsule 0  . hydrOXYzine  pamoate (VISTARIL ) 25 MG capsule SMARTSIG:1-2 Capsule(s) By Mouth Every 6-8 Hours PRN (Patient not taking: Reported on 05/05/2023)    . semaglutide  (WEGOVY ) 0.25 mg/0.5 mL pen injector Inject subcutaneously (Patient not  taking: Reported on 05/05/2023)     No current facility-administered medications on file prior to visit.   Past Medical History:  Past Medical History:  Diagnosis Date  . Depression   . Migraine headache     Past Surgical History: History reviewed. No pertinent surgical history. Family History:  Family History  Problem Relation Name Age of Onset  . High blood pressure (Hypertension) Mother    . Cancer Mother    . Aneurysm Mother    . Cancer Father    . High blood pressure (Hypertension) Sister    . High blood pressure (Hypertension) Brother    . Multiple sclerosis Paternal Uncle    . Brain hemorrhage Maternal Grandmother    . Aneurysm Maternal Grandmother    . High blood pressure (Hypertension) Paternal Grandmother     Social History:  Social History   Socioeconomic History  . Marital status: Widowed  Tobacco Use  .  Smoking status: Every Day    Types: Cigarettes  . Smokeless tobacco: Current  Vaping Use  . Vaping status: Unknown  Substance and Sexual Activity  . Alcohol use: Yes  . Drug use: Never  . Sexual activity: Yes   Social Drivers of Corporate Investment Banker Strain: Low Risk  (12/24/2022)   [facility]   Overall Financial Resource Strain (CARDIA)   . Difficulty of Paying Living Expenses: Not hard at all  Food Insecurity: No Food Insecurity (12/24/2022)   Received from [facility]   Hunger Vital Sign   . Worried About Programme Researcher, Broadcasting/film/video in the Last Year: Never true   . Ran Out of Food in the Last Year: Never true  Transportation Needs: No Transportation Needs (12/24/2022)   Received from [facility] - Transportation   . Lack of Transportation (Medical): No   . Lack of Transportation (Non-Medical): No  Physical Activity: Sufficiently Active (12/24/2022)   Received from [facility]   Exercise Vital Sign   . Days of Exercise per Week: 3 days   . Minutes of Exercise per Session: 60 min  Stress: No Stress Concern Present (12/24/2022)    Received from [facility] - Occupational Stress Questionnaire   . Feeling of Stress : Only a little  Social Connections: Moderately Integrated (12/24/2022)   Received from [facility]   Social Connection and Isolation Panel [NHANES]   . Frequency of Communication with Friends and Family: More than three times a week   . Frequency of Social Gatherings with Friends and Family: More than three times a week   . Attends Religious Services: More than 4 times per year   . Active Member of Clubs or Organizations: Yes   . Attends Banker Meetings: More than 4 times per year   . Marital Status: Widowed  Housing Stability: Unknown (05/05/2023)   Housing Stability Vital Sign   . Homeless in the Last Year: No   Allergies:  Allergies  Allergen Reactions  . Sumatriptan  Anaphylaxis    Throat and tongue swelling  Throat and tongue swelling    . Penicillins Rash  . Vortioxetine  Other (See Comments)    Tingling hands and blurred vision  Tingling hands and blurred vision     Jannett Fairly, MD

## 2023-05-06 ENCOUNTER — Telehealth: Payer: Self-pay | Admitting: Family Medicine

## 2023-05-06 NOTE — Telephone Encounter (Signed)
Sent a Clinical cytogeneticist message with details of referral.

## 2023-05-06 NOTE — Telephone Encounter (Signed)
Called pt no answer unable to leave vm due to mailbox full.  Referral has been sent to:   [facility]   P: [phone] F: [phone]

## 2023-05-06 NOTE — Telephone Encounter (Signed)
Copied from CRM [id number]. Topic: Referral - Status >> May 05, 2023  4:08 PM Everette C wrote: Reason for CRM: The patient has called for an update on the status of their referral   The patient has previously discussed a referral to [facility] Neurology and would like an update on the status of their referral   Please contact when possible

## 2023-06-09 ENCOUNTER — Ambulatory Visit (INDEPENDENT_AMBULATORY_CARE_PROVIDER_SITE_OTHER): Admitting: Professional Counselor

## 2023-06-09 DIAGNOSIS — F331 Major depressive disorder, recurrent, moderate: Secondary | ICD-10-CM | POA: Diagnosis not present
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
## 2023-06-18 ENCOUNTER — Other Ambulatory Visit: Payer: Self-pay | Admitting: Family Medicine

## 2023-06-21 NOTE — Telephone Encounter (Signed)
 Both appts sch'd

## 2023-07-09 ENCOUNTER — Ambulatory Visit (INDEPENDENT_AMBULATORY_CARE_PROVIDER_SITE_OTHER): Admitting: Professional Counselor

## 2023-07-09 DIAGNOSIS — F331 Major depressive disorder, recurrent, moderate: Secondary | ICD-10-CM | POA: Diagnosis not present

## 2023-07-09 NOTE — Progress Notes (Signed)
 THERAPIST PROGRESS NOTE  Virtual Visit via Video Note  I connected with Olivia King on 07/09/23 at  8:00 AM EDT by a video enabled telemedicine application and verified that I am speaking with the correct person using two identifiers.  Location: Patient: Work Surveyor, minerals) Provider: Home office   I discussed the limitations of evaluation and management by telemedicine and the availability of in person appointments. The patient expressed understanding and agreed to proceed.   I discussed the assessment and treatment plan with the patient. The patient was provided an opportunity to ask questions and all were answered. The patient agreed with the plan and demonstrated an understanding of the instructions.   The patient was advised to call back or seek an in-person evaluation if the symptoms worsen or if the condition fails to improve as anticipated.  I provided 41 minutes of non-face-to-face time during this encounter. Edmonia Lynch, [facility]  Session Time: 8:02 AM  - 8:43 AM   Participation Level: Active  Behavioral Response: Well Groomed, Alert, Dysphoric  Type of Therapy: Individual Therapy  Treatment Goals addressed: Active OP Depression  LTG: "Whenever I get into that deep depression, to be able to pull myself out in a short manner instead of letting it linger. Ways to forget thinking about the past."    Start:  07/09/23    Expected End:  07/07/24     STG: "I'm gonna be honest with you, it's my kid. She's always mean and angry." To improve interpersonal effectiveness AEB increased positive interactions and decrease in negative emotional responses over the next 90 days.    STG: "Maybe coping skills." To increase knowledge of coping skills AEB psychoeducation and practice of skills over the next 90 days.    ProgressTowards Goals: Initial  Interventions: CBT, Motivational Interviewing, and Supportive  Summary: Olivia King is a 38 y.o. female who presents with a history of  anxiety and depression. She appeared dysphoric but oriented x5. She stated things are okay. She has been busy with work and her daughter's sports. Olivia King engaged in developing her treatment plan. She was receptive to coping skills and noted she has done breathing exercises and meditations in the past. She engaged in discussion about her daughter and their relationship. Olivia King was receptive to checking the facts to help alleviate her negative emotions around the situation.   Therapist Response: Conducted session with [employer]. Began session with check-in/update since previous session. Utilized empathetic and reflective listening. Developed treatment plan with input from Olivia King on current strengths, needs, and progress towards goals. Discussed basic coping skills and engaged Olivia King in 5-4-3-2-1 grounding mechanism. Used open-ended questions to facilitate discussion and explored Olivia King thoughts/feelings about her daughter and their relationship. Olivia King there is a another female therapist in the office that sees children if she wants to get her daughter into therapy. Explained "check the fact" for emotion regulation and explained previous experience, thought patterns, and confirmation bias may be a contributing factor with her daughter. Scheduled additional appointment and concluded session.   Suicidal/Homicidal: No  Plan: Return again in 2 weeks.  Diagnosis: MDD (major depressive disorder), recurrent episode, moderate (HCC)  Collaboration of Care: Medication Management AEB chart review  Patient/Guardian was advised Release of Information must be obtained prior to any record release in order to collaborate their care with an outside provider. Patient/Guardian was advised if they have not already done so to contact the registration department to sign all necessary forms in order for [place] to release information regarding their  care.   Consent: Patient/Guardian gives verbal consent for treatment  and assignment of benefits for services provided during this visit. Patient/Guardian expressed understanding and agreed to proceed.   Edmonia Lynch, [facility] 07/09/2023

## 2023-07-20 DIAGNOSIS — R600 Localized edema: Secondary | ICD-10-CM

## 2023-07-20 HISTORY — DX: Localized edema: R60.0

## 2023-07-21 ENCOUNTER — Ambulatory Visit (INDEPENDENT_AMBULATORY_CARE_PROVIDER_SITE_OTHER): Admitting: Professional Counselor

## 2023-07-21 ENCOUNTER — Ambulatory Visit (INDEPENDENT_AMBULATORY_CARE_PROVIDER_SITE_OTHER): Admitting: Family Medicine

## 2023-07-21 ENCOUNTER — Encounter: Payer: Self-pay | Admitting: Family Medicine

## 2023-07-21 VITALS — BP 130/84 | HR 101 | Resp 16 | Ht 63.0 in | Wt 257.1 lb

## 2023-07-21 DIAGNOSIS — E785 Hyperlipidemia, unspecified: Secondary | ICD-10-CM | POA: Diagnosis not present

## 2023-07-21 DIAGNOSIS — F411 Generalized anxiety disorder: Secondary | ICD-10-CM | POA: Diagnosis not present

## 2023-07-21 DIAGNOSIS — G932 Benign intracranial hypertension: Secondary | ICD-10-CM | POA: Diagnosis not present

## 2023-07-21 DIAGNOSIS — E559 Vitamin D deficiency, unspecified: Secondary | ICD-10-CM

## 2023-07-21 DIAGNOSIS — R600 Localized edema: Secondary | ICD-10-CM

## 2023-07-21 DIAGNOSIS — R000 Tachycardia, unspecified: Secondary | ICD-10-CM | POA: Diagnosis not present

## 2023-07-21 DIAGNOSIS — L732 Hidradenitis suppurativa: Secondary | ICD-10-CM | POA: Diagnosis not present

## 2023-07-21 DIAGNOSIS — G43009 Migraine without aura, not intractable, without status migrainosus: Secondary | ICD-10-CM | POA: Diagnosis not present

## 2023-07-21 DIAGNOSIS — E538 Deficiency of other specified B group vitamins: Secondary | ICD-10-CM

## 2023-07-21 DIAGNOSIS — B9689 Other specified bacterial agents as the cause of diseases classified elsewhere: Secondary | ICD-10-CM

## 2023-07-21 DIAGNOSIS — J302 Other seasonal allergic rhinitis: Secondary | ICD-10-CM

## 2023-07-21 DIAGNOSIS — N760 Acute vaginitis: Secondary | ICD-10-CM | POA: Diagnosis not present

## 2023-07-21 DIAGNOSIS — J301 Allergic rhinitis due to pollen: Secondary | ICD-10-CM

## 2023-07-21 HISTORY — DX: Other seasonal allergic rhinitis: J30.2

## 2023-07-21 MED ORDER — METOPROLOL SUCCINATE ER 25 MG PO TB24
25.0000 mg | ORAL_TABLET | Freq: Every day | ORAL | 0 refills | Status: DC
Start: 2023-07-21 — End: 2023-10-25

## 2023-07-21 MED ORDER — CLINDAMYCIN PHOSPHATE 1 % EX LOTN
TOPICAL_LOTION | Freq: Two times a day (BID) | CUTANEOUS | 2 refills | Status: DC
Start: 2023-07-21 — End: 2023-12-29

## 2023-07-21 MED ORDER — FLUTICASONE PROPIONATE 50 MCG/ACT NA SUSP: 2.0000 | Freq: Every day | NASAL | 1 refills | Status: DC

## 2023-07-21 MED ORDER — METRONIDAZOLE 0.75 % VA GEL
Freq: Two times a day (BID) | VAGINAL | 2 refills | Status: AC
Start: 2023-07-21 — End: ?

## 2023-07-21 MED ORDER — OLOPATADINE HCL 0.2 % OP SOLN
1.0000 [drp] | Freq: Every day | OPHTHALMIC | 1 refills | Status: DC
Start: 2023-07-21 — End: 2023-12-16

## 2023-07-21 NOTE — Progress Notes (Signed)
 Name: Olivia King   MRN: [id number]    DOB: 1985-12-28   Date:07/21/2023       Progress Note  Subjective  Chief Complaint  Chief Complaint  Patient presents with   Medical Management of Chronic Issues   HPI   Discussed the use of AI scribe software for clinical note transcription with the patient, who gave verbal consent to proceed.  History of Present Illness Olivia King is a 38 year old female with idiopathic intracranial hypertension who presents with pulsating tinnitus and follow-up on neurology referral.  She experiences right-sided pulsating tinnitus, which improves with jugular pressure or bearing down. A lumbar puncture in mid-2023 showed an opening pressure of 21 cm H2O, providing temporary relief in symptoms, including vision and hearing improvement. She had a complication requiring a blood patch post-procedure.  She is seeking a second opinion from a neurologist at [facility] due to dissatisfaction with her current neurologist's focus on migraines rather than her primary concern of pulsating tinnitus related to idiopathic intracranial hypertension.  She is currently taking torsemide and has resumed topiramate at 50 mg after a period of discontinuation due to an increase in venlafaxine by her psychiatrist. She has a history of migraines but no longer experiences them frequently and does not have associated auras or cognitive fogginess. She previously used Emgality for migraines but discontinued  taking it recently  as she felt it was unnecessary.  She has a history of major depressive disorder, generalized anxiety disorder, and an eating disorder, for which she is seeing a therapist and psychiatrist. She is currently taking venlafaxine, lamotrigine, and hydroxyzine as needed for sleep and anxiety. She also uses trazodone occasionally for sleep disturbances.  For hidradenitis suppurativa, she uses clindamycin lotion and triamcinolone cream, although she notes that  outbreaks are infrequent.   She also uses Metrogel vaginal gel for bacterial vaginosis prevention post-menstruation.  She has a history of lymphedema and uses compression socks and torsemide as needed. She has seen a vascular surgeon and discussed the use of leg pumps.  She has morbid obesity, with weight fluctuations over the years. She previously engaged in a regular exercise routine with a trainer, which resulted in weight loss and improved well-being, but she has since regained the weight after stopping the routine due to personal circumstances.She has tried GLP-1 in the past without success. Considering gastric bypass surgery  She is taking vitamin D and folic acid supplements. Her vitamin D level was slightly low, and she has a history of prediabetes with a stable A1c. She reports a history of tachycardia and has been prescribed metoprolol in the past but has not started it. She is considering resuming it to manage her heart rate.      Patient Active Problem List   Diagnosis Date Noted   Idiopathic intracranial hypertension 03/17/2023   Carotid bruit 03/21/2022   Syncope 03/15/2022   Vertigo 02/18/2022   Insomnia 10/23/2021   Hidradenitis suppurativa 10/30/2020   Family history of pancreatic cancer 08/16/2020   Recurrent major depressive disorder, in full remission (HCC) 07/15/2020   MDD (major depressive disorder), recurrent episode, moderate (HCC) 02/13/2020   Tobacco use disorder 02/13/2020   MDD (major depressive disorder), recurrent episode, mild (HCC) 10/10/2019   GAD (generalized anxiety disorder) 10/10/2019   Eating disorder 10/10/2019   Lymphedema 04/10/2019   Chronic venous insufficiency 04/10/2019   Bilateral carpal tunnel syndrome 11/04/2018   Morbid obesity (HCC) 06/14/2017   Contraception management 01/06/2016   Recurrent major depressive disorder,  in partial remission (HCC) 08/19/2015   Low serum vitamin D 12/24/2014   Migraine without aura 12/21/2014    Bilateral edema of lower extremity 12/21/2014    Past Surgical History:  Procedure Laterality Date   CESAREAN SECTION  2004 &2012   contraceptive implant      Family History  Problem Relation Age of Onset   Hypertension Mother    Pancreatic cancer Mother 39   Hypertension Brother    Aneurysm Maternal Grandmother    Heart attack Maternal Grandfather    Hypertension Paternal Grandmother    Heart failure Sister     Social History   Tobacco Use   Smoking status: Former    Current packs/day: 0.00    Types: Cigarettes    Start date: 03/05/2011    Quit date: 06/06/2022    Years since quitting: 1.1   Smokeless tobacco: Never  Substance Use Topics   Alcohol use: Yes    Alcohol/week: 0.0 standard drinks of alcohol     Current Outpatient Medications:    cetirizine (ZYRTEC) 10 MG tablet, Take 10 mg by mouth daily., Disp: , Rfl:    clindamycin (CLEOCIN T) 1 % lotion, Apply topically 2 (two) times daily., Disp: 60 mL, Rfl: 2   EPINEPHrine 0.3 mg/0.3 mL IJ SOAJ injection, Inject into the muscle., Disp: , Rfl:    fluticasone (FLONASE) 50 MCG/ACT nasal spray, Place 2 sprays into both nostrils daily., Disp: , Rfl:    folic acid (FOLVITE) 1 MG tablet, Take 1 mg by mouth daily., Disp: , Rfl:    hydrOXYzine (ATARAX) 10 MG tablet, Take 1 tablet (10 mg total) by mouth at bedtime as needed., Disp: 90 tablet, Rfl: 0   ipratropium (ATROVENT) 0.03 % nasal spray, ipratropium bromide 21 mcg (0.03 %) nasal spray, Disp: , Rfl:    lamoTRIgine (LAMICTAL) 25 MG tablet, Take 3 tablets (75 mg total) by mouth daily., Disp: 270 tablet, Rfl: 0   meloxicam (MOBIC) 15 MG tablet, Take 1 tablet (15 mg total) by mouth daily., Disp: 60 tablet, Rfl: 1   metroNIDAZOLE (METROGEL) 0.75 % vaginal gel, PLACE VAGINALLY TWICE DAILY, Disp: 70 g, Rfl: 0   Multiple Vitamin (MULTI-VITAMINS) TABS, Take by mouth., Disp: , Rfl:    REFRESH CELLUVISC 1 % GEL, Apply 1 drop to eye 4 (four) times daily., Disp: , Rfl:    topiramate  (TOPAMAX) 50 MG tablet, Take 50 mg by mouth 2 (two) times daily., Disp: , Rfl:    torsemide (DEMADEX) 20 MG tablet, Take 1 tablet (20 mg total) by mouth every three (3) days as needed., Disp: 30 tablet, Rfl: 0   venlafaxine XR (EFFEXOR XR) 37.5 MG 24 hr capsule, Take 1 capsule (37.5 mg total) by mouth daily with breakfast. Take along with 75 mg daily, Disp: 90 capsule, Rfl: 0   venlafaxine XR (EFFEXOR XR) 75 MG 24 hr capsule, Take 1 capsule (75 mg total) by mouth daily with breakfast. Take along with 37.5 mg daily, Disp: 90 capsule, Rfl: 0   Vitamin D, Ergocalciferol, (DRISDOL) 1.25 MG (50000 UNIT) CAPS capsule, Take 50,000 Units by mouth once a week., Disp: , Rfl:    Galcanezumab-gnlm (EMGALITY) 120 MG/ML SOAJ, Inject into the skin. (Patient not taking: Reported on 07/21/2023), Disp: , Rfl:    traZODone (DESYREL) 50 MG tablet, Take 0.5-1 tablets (25-50 mg total) by mouth at bedtime as needed for sleep. (Patient not taking: Reported on 07/21/2023), Disp: 30 tablet, Rfl: 1   vitamin B-12 (CYANOCOBALAMIN) 500 MCG  tablet, Take 500 mcg by mouth daily. (Patient not taking: Reported on 07/21/2023), Disp: , Rfl:   Current Facility-Administered Medications:    betamethasone acetate-betamethasone sodium phosphate (CELESTONE) injection 3 mg, 3 mg, Intra-articular, Once,   Allergies  Allergen Reactions   Sumatriptan Anaphylaxis    Throat and tongue swelling  Throat and tongue swelling  Throat and tongue swelling    Penicillins Rash   Vortioxetine Other (See Comments)    Tingling hands and blurred vision  Tingling hands and blurred vision  Tingling hands and blurred vision    Doxycycline     Unable to take it currently due to intracranial hypertension    Oxybate     I personally reviewed active problem list, medication list, allergies, family history with the patient/caregiver today.   ROS  Ten systems reviewed and is negative except as mentioned in HPI    Objective Physical Exam VITALS: P- 101  after rest CONSTITUTIONAL: Patient appears well-developed and well-nourished. No distress. HEENT: Head atraumatic, normocephalic, neck supple. CARDIOVASCULAR: Normal rate, regular rhythm and normal heart sounds. No murmur heard. Non pitting  BLE edema. PULMONARY: Effort normal and breath sounds normal. No respiratory distress. ABDOMINAL: There is no tenderness or distention. MUSCULOSKELETAL: Normal gait. Without gross motor or sensory deficit. PSYCHIATRIC: Patient has a normal mood and affect. Behavior is normal. Judgment and thought content normal. BREAST: Breasts symmetrical, no abnormal masses palpated.  Vitals:   07/21/23 0957 07/21/23 0959  BP: 130/84   Pulse: (!) 114 (!) 101  Resp: 16   SpO2: 99%   Weight: 257 lb 1.6 oz (116.6 kg)   Height: 5\' 3"  (1.6 m)     Body mass index is 45.54 kg/m.    Diabetic Foot Exam:     PHQ2/9:    07/21/2023    9:56 AM 06/09/2023    8:06 AM 05/05/2023    4:44 PM 03/17/2023   11:32 AM 03/03/2023    8:17 AM  Depression screen PHQ 2/9  Decreased Interest 0   1   Down, Depressed, Hopeless 0   1   PHQ - 2 Score 0   2   Altered sleeping 0   1   Tired, decreased energy 0   0   Change in appetite 0   0   Feeling bad or failure about yourself  0   0   Trouble concentrating 0   1   Moving slowly or fidgety/restless 0   0   Suicidal thoughts 0   0   PHQ-9 Score 0   4   Difficult doing work/chores Not difficult at all   Somewhat difficult      Information is confidential and restricted. Go to Review Flowsheets to unlock data.    phq 9 is negative   Fall Risk:    03/17/2023   11:31 AM 12/24/2022   10:59 AM 08/21/2022    2:18 PM 03/25/2022    8:53 AM 02/10/2022    2:17 PM  Fall Risk   Falls in the past year? 0 0 0 0 0  Number falls in past yr: 0  0 0   Injury with Fall? 0  0 0   Risk for fall due to : No Fall Risks No Fall Risks  No Fall Risks No Fall Risks  Follow up Falls prevention discussed;Education provided;Falls evaluation  completed Falls prevention discussed;Education provided;Falls evaluation completed  Falls prevention discussed Falls prevention discussed;Education provided;Falls evaluation completed     Assessment and Plan Assessment &  Plan Idiopathic Intracranial Hypertension Idiopathic intracranial hypertension with right-sided pulsatile tinnitus. Lumbar puncture showed opening pressure of 21 cm H2O. Currently on torsemide and topiramate. Seeks second opinion at [facility] due to management dissatisfaction and stroke risk concerns. Neurologist advised against another lumbar puncture. - Schedule neurology appointment at [facility] for second opinion. - Continue torsemide and topiramate as prescribed.  Major Depressive Disorder and Generalized Anxiety Disorder Major depressive disorder and generalized anxiety disorder managed with venlafaxine, lamotrigine, and hydroxyzine. Trazodone used PRN for sleep. Current regimen effective. Attends therapy for anxiety. - Continue venlafaxine, lamotrigine, and hydroxyzine as prescribed. - Use trazodone PRN for sleep disturbances. - Continue therapy sessions for anxiety management.  Tachycardia Tachycardia with recent heart rates of 114 bpm and 101 bpm. Metoprolol not started. May reduce cardiac workload and prevent congestive heart failure. - Prescribe metoprolol for tachycardia management as recommended by cardiologist in the past   Hidradenitis Suppurativa Hidradenitis suppurativa managed with clindamycin lotion. Triamcinolone cream discontinued due to infection risk. Reports infrequent outbreaks. - Prescribe clindamycin lotion for hidradenitis suppurativa. - Discontinue triamcinolone cream for open lesions.  Bacterial Vaginosis Recurrent bacterial vaginosis post-menstruation. Uses metronidazole vaginal gel for prevention. - Prescribe metronidazole vaginal gel for use post-menstruation.  Lymphedema Lymphedema managed with compression stockings and  torsemide. Consulted vascular surgeon about compression pumps. - Continue use of compression stockings and torsemide as needed. - Consider follow-up with vascular surgeon for compression pump therapy.  Morbid obesity - BMI over 40 Obesity with weight fluctuations. Previously lost weight through exercise and diet. Considering bariatric surgery if lifestyle modifications fail. Emphasized addressing sugar cravings and maintaining a healthy lifestyle. - Encourage resumption of regular physical activity and healthy eating habits. - Discuss bariatric surgery options if lifestyle changes are not effective.  General Health Maintenance Takes vitamin D and folic acid supplements. Vitamin D level slightly low. Prediabetes with good A1c control. Emphasized physical activity and high-calcium diet for bone health. - Continue vitamin D and folic acid supplementation. - Maintain a high-calcium diet and regular physical activity.  Follow-up Scheduled for physical in six months. Will follow up on various health issues as needed. - Schedule follow-up appointment in six months for physical examination.

## 2023-07-21 NOTE — Progress Notes (Signed)
 THERAPIST PROGRESS NOTE  Virtual Visit via Video Note  I connected with Volney American on 07/21/23 at  8:00 AM EDT by a video enabled telemedicine application and verified that I am speaking with the correct person using two identifiers.  Location: Patient: Home Provider: Office   I discussed the limitations of evaluation and management by telemedicine and the availability of in person appointments. The patient expressed understanding and agreed to proceed.   I discussed the assessment and treatment plan with the patient. The patient was provided an opportunity to ask questions and all were answered. The patient agreed with the plan and demonstrated an understanding of the instructions.   The patient was advised to call back or seek an in-person evaluation if the symptoms worsen or if the condition fails to improve as anticipated.  I provided 32 minutes of non-face-to-face time during this encounter. Edmonia Lynch, [facility]  Session Time: 8:00 AM - 8:32 AM   Participation Level: Active  Behavioral Response: Casual, Alert, Euthymic  Type of Therapy: Individual Therapy  Treatment Goals addressed: Active OP Depression  LTG: "Whenever I get into that deep depression, to be able to pull myself out in a short manner instead of letting it linger. Ways to forget thinking about the past."               Start:  07/09/23    Expected End:  07/07/24      STG: "I'm gonna be honest with you, it's my kid. She's always mean and angry." To improve interpersonal effectiveness AEB increased positive interactions and decrease in negative emotional responses over the next 90 days.     STG: "Maybe coping skills." To increase knowledge of coping skills AEB psychoeducation and practice of skills over the next 90 days.    ProgressTowards Goals: Progressing  Interventions: CBT and Supportive  Summary: Briley Bumgarner is a 38 y.o. female who presents with a history of anxiety and depression. She  appeared alert and oriented x5. She stated things are going well. She discussed some of her fears/paranoia and how she tries to prevent anything bad from happening. Kresta engaged in writing exercise. She was able to identify things within and outside of her control. She was receptive to input from Conservation officer, nature as well. She noted she is often putting her energy into things she can't really control. She will try to be more intentional on putting energy into the things she can control. Taylorann was also receptive to practicing grounding skills more often, rather than waiting until she needs them.   Therapist Response: Conducted session with [employer]. Began session with check-in/update since previous session. Utilized empathetic and reflective listening. Used open-ended questions to help facilitate discussion and identify topic/exercise for today. Engaged Shavonne in writing exercise - donut/circles of control. Assisted with identifying things within and outside of her control. Encouraged Sherill to focus energy on what she has control over. Reminded her of the importance to practice grounding skills consistently, rather than waiting for an episode to occur. Scheduled additional appointment and concluded session.   Suicidal/Homicidal: No  Plan: Return again in 2 weeks.  Diagnosis: GAD (generalized anxiety disorder)  Collaboration of Care: Medication Management AEB chart review  Patient/Guardian was advised Release of Information must be obtained prior to any record release in order to collaborate their care with an outside provider. Patient/Guardian was advised if they have not already done so to contact the registration department to sign all necessary forms in order for [place] to  release information regarding their care.   Consent: Patient/Guardian gives verbal consent for treatment and assignment of benefits for services provided during this visit. Patient/Guardian expressed understanding and agreed to  proceed.   Edmonia Lynch, [facility] 07/21/2023

## 2023-08-04 ENCOUNTER — Ambulatory Visit: Admitting: Professional Counselor

## 2023-08-04 DIAGNOSIS — F330 Major depressive disorder, recurrent, mild: Secondary | ICD-10-CM

## 2023-08-04 DIAGNOSIS — F411 Generalized anxiety disorder: Secondary | ICD-10-CM | POA: Diagnosis not present

## 2023-08-04 NOTE — Progress Notes (Signed)
 THERAPIST PROGRESS NOTE  Virtual Visit via Video Note  I connected with Volney American on 08/04/23 at  8:00 AM EDT by a video enabled telemedicine application and verified that I am speaking with the correct person using two identifiers.  Location: Patient: Community  Provider: Office    I discussed the limitations of evaluation and management by telemedicine and the availability of in person appointments. The patient expressed understanding and agreed to proceed.   I discussed the assessment and treatment plan with the patient. The patient was provided an opportunity to ask questions and all were answered. The patient agreed with the plan and demonstrated an understanding of the instructions.   The patient was advised to call back or seek an in-person evaluation if the symptoms worsen or if the condition fails to improve as anticipated.  I provided 29 minutes of non-face-to-face time during this encounter. Edmonia Lynch, [facility]  Session Time: 8:00 AM - 8:29 AM  Participation Level: Minimal  Behavioral Response: Casual, Alert, Dysphoric  Type of Therapy: Individual Therapy  Treatment Goals addressed:  Active OP Depression  LTG: "Whenever I get into that deep depression, to be able to pull myself out in a short manner instead of letting it linger. Ways to forget thinking about the past."               Start:  07/09/23    Expected End:  07/07/24      STG: "I'm gonna be honest with you, it's my kid. She's always mean and angry." To improve interpersonal effectiveness AEB increased positive interactions and decrease in negative emotional responses over the next 90 days.     STG: "Maybe coping skills." To increase knowledge of coping skills AEB psychoeducation and practice of skills over the next 90 days.    ProgressTowards Goals: Progressing  Interventions: CBT  Summary: Myrtice Lowdermilk is a 38 y.o. female who presents with a history of anxiety and depression. She appeared  somber but oriented x5. She stated things are going okay. She visited her son in [place] this past weekend. Her daughter stayed with her father so she allowed her to have her phone back. She reported they talked about what was expected in order for her to keep it. Jonel was in agreement to do writing exercise, but couldn't identify any negative thoughts to write. She reported she tries to just be positive all the time and ignore negative thinking. She actively listened to types of cognitive distortions. Lynette inquired about her previous therapist to confirm it's not the same as the other provider she wants to get her daughter scheduled for therapy.   Therapist Response: Conducted telehealth session with [employer]. Began session with check-in/update since previous session. Utilized empathetic and reflective listening. Attempted to engage Andora in writing exercise, "Cracking the NUTS and eliminating the ANTS." However, Payslie was unable to identify any negative thoughts at this time. Provided psychoeducation on cognitive model and cognitive distortions. Emailed copy of handout to [place] and encouraged her to build awareness around her thinking patterns. Suggested thought log especially during situations with negative emotions. Confirmed her previous therapist is not the same therapist her daughter would be scheduled with. Scheduled additional appointment and concluded session.   Suicidal/Homicidal: No  Plan: Return again in 4 weeks.  Diagnosis: GAD (generalized anxiety disorder)  MDD (major depressive disorder), recurrent episode, mild (HCC)  Collaboration of Care: Medication Management AEB chart review  Patient/Guardian was advised Release of Information must be obtained prior to any  record release in order to collaborate their care with an outside provider. Patient/Guardian was advised if they have not already done so to contact the registration department to sign all necessary forms in order for us   to release information regarding their care.   Consent: Patient/Guardian gives verbal consent for treatment and assignment of benefits for services provided during this visit. Patient/Guardian expressed understanding and agreed to proceed.   Len Quale, [facility] 08/04/2023

## 2023-08-11 ENCOUNTER — Encounter: Payer: Self-pay | Admitting: Psychiatry

## 2023-08-11 ENCOUNTER — Encounter: Payer: Self-pay | Admitting: Family Medicine

## 2023-08-11 ENCOUNTER — Ambulatory Visit (INDEPENDENT_AMBULATORY_CARE_PROVIDER_SITE_OTHER): Payer: Self-pay | Admitting: Psychiatry

## 2023-08-11 VITALS — BP 120/86 | HR 77 | Temp 98.4°F | Ht 63.0 in | Wt 264.8 lb

## 2023-08-11 DIAGNOSIS — F411 Generalized anxiety disorder: Secondary | ICD-10-CM

## 2023-08-11 DIAGNOSIS — F3342 Major depressive disorder, recurrent, in full remission: Secondary | ICD-10-CM | POA: Diagnosis not present

## 2023-08-11 DIAGNOSIS — G4701 Insomnia due to medical condition: Secondary | ICD-10-CM

## 2023-08-11 MED ORDER — VENLAFAXINE HCL ER 75 MG PO CP24: 75.0000 mg | ORAL_CAPSULE | Freq: Every day | ORAL | 1 refills | Status: DC

## 2023-08-11 MED ORDER — LAMOTRIGINE 25 MG PO TABS: 75.0000 mg | ORAL_TABLET | Freq: Every day | ORAL | 1 refills | Status: DC

## 2023-08-11 MED ORDER — HYDROXYZINE HCL 10 MG PO TABS
10.0000 mg | ORAL_TABLET | Freq: Every evening | ORAL | 1 refills | Status: DC | PRN
Start: 2023-08-11 — End: 2023-11-12

## 2023-08-11 NOTE — Progress Notes (Signed)
 BH MD OP Progress Note  08/11/2023 4:42 PM Olivia King  MRN:  [id number]  Chief Complaint:  Chief Complaint  Patient presents with   Follow-up   Anxiety   Depression   Insomnia   Medication Refill   Discussed the use of AI scribe software for clinical note transcription with the patient, who gave verbal consent to proceed.  History of Present Illness Olivia King is a 38 year old female, employed, lives in [place], widowed, has a history of MDD, GAD, tobacco use disorder, insomnia, intracranial hypertension was evaluated in the office today.  She is currently undergoing therapy with [name], focusing on issues related to paranoia, particularly concerning her daughter's safety. She is working on allowing her daughter more independence, such as playing outside without a cell phone, which she finds challenging.  She has a history of anxiety and depression, with anxiety previously improving and depression in remission. She is currently taking venlafaxine  75 mg, having stopped the 37.5 mg dose about a month ago. She also restarted topiramate , currently at 50 mg twice a day, and plans to increase to 150 mg starting Monday for headache management. She experiences migraines, particularly heat-induced, and had a migraine last Saturday after being outside all day.  She is also taking lamotrigine  75 mg and hydroxyzine  as needed.  Denies suicidality, homicidality or perceptual disturbances.  She reports sleeping well at night and denies any other concerns today.     Visit Diagnosis:    ICD-10-CM   1. MDD (major depressive disorder), recurrent, in full remission (HCC)  F33.42 venlafaxine  XR (EFFEXOR  XR) 75 MG 24 hr capsule    2. GAD (generalized anxiety disorder)  F41.1 hydrOXYzine  (ATARAX ) 10 MG tablet    3. Insomnia due to medical condition  G47.01 lamoTRIgine  (LAMICTAL ) 25 MG tablet   Mood      Past Psychiatric History: I have reviewed past psychiatric history from  progress note on 10/10/2019.  Past trials of medications like Lexapro , Vyvanse , Abilify , Paxil , Zoloft, Cymbalta .  Past Medical History:  Past Medical History:  Diagnosis Date   Cigarette smoker    COVID-19 virus detected 03/21/2019   Family history of pancreatic cancer 08/2020   MyRisk neg except SDHA VUS; IBIS=9.2%/riskscore=7.3%   IUD contraception 02/06/2016   Placed January 23, 2016 by [name], PA [facility]   Major depressive disorder, recurrent (HCC) 08/19/2015   zoloft made her cry; cymbalta  made her too sleepy   Migraine headache     Past Surgical History:  Procedure Laterality Date   CESAREAN SECTION  2004 &2012   contraceptive implant      Family Psychiatric History: I have reviewed family psychiatric history from progress note on 10/10/2019.  Family History:  Family History  Problem Relation Age of Onset   Hypertension Mother    Pancreatic cancer Mother 72   Hypertension Brother    Aneurysm Maternal Grandmother    Heart attack Maternal Grandfather    Hypertension Paternal Grandmother    Heart failure Sister     Social History: I have reviewed social history from progress note on 10/10/2019. Social History   Socioeconomic History   Marital status: Widowed    Spouse name: Not on file   Number of children: 2   Years of education: Not on file   Highest education level: Associate degree: occupational, scientist, product/process development, or vocational program  Occupational History   Not on file  Tobacco Use   Smoking status: Former    Current packs/day: 0.00  Types: Cigarettes    Start date: 03/05/2011    Quit date: 06/06/2022    Years since quitting: 1.1   Smokeless tobacco: Never  Vaping Use   Vaping status: Never Used  Substance and Sexual Activity   Alcohol use: Yes    Alcohol/week: 0.0 standard drinks of alcohol   Drug use: No   Sexual activity: Yes    Partners: Male    Birth control/protection: I.U.D.    Comment: Patient has liletta  IUD  Other Topics Concern    Not on file  Social History Narrative   She is a widow, he died 23-Apr-2010 while he was deployed.    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   Social Drivers of Health   Financial Resource Strain: Low Risk  (06/09/2023)   Overall Financial Resource Strain (CARDIA)    Difficulty of Paying Living Expenses: Not hard at all  Food Insecurity: No Food Insecurity (06/09/2023)   Hunger Vital Sign    Worried About Running Out of Food in the Last Year: Never true    Ran Out of Food in the Last Year: Never true  Transportation Needs: No Transportation Needs (06/09/2023)   PRAPARE - Administrator, Civil Service (Medical): No    Lack of Transportation (Non-Medical): No  Physical Activity: Inactive (06/09/2023)   Exercise Vital Sign    Days of Exercise per Week: 0 days    Minutes of Exercise per Session: 0 min  Stress: Stress Concern Present (06/09/2023)   [employer] of Occupational Health - Occupational Stress Questionnaire    Feeling of Stress : To some extent  Social Connections: Moderately Isolated (06/09/2023)   Social Connection and Isolation Panel [NHANES]    Frequency of Communication with Friends and Family: More than three times a week    Frequency of Social Gatherings with Friends and Family: Once a week    Attends Religious Services: 1 to 4 times per year    Active Member of [employer] or Organizations: No    Attends Banker Meetings: Never    Marital Status: Widowed    Allergies:  Allergies  Allergen Reactions   Sumatriptan  Anaphylaxis    Throat and tongue swelling  Throat and tongue swelling  Throat and tongue swelling    Penicillins Rash   Vortioxetine  Other (See Comments)    Tingling hands and blurred vision  Tingling hands and blurred vision  Tingling hands and blurred vision    Doxycycline      Unable to take it currently due to intracranial hypertension    Oxybate     Metabolic Disorder Labs: Lab Results  Component Value Date   HGBA1C 5.4  12/24/2022   MPG 108 12/24/2022   MPG 97 12/15/2021   No results found for: PROLACTIN Lab Results  Component Value Date   CHOL 183 12/24/2022   TRIG 86 12/24/2022   HDL 50 12/24/2022   CHOLHDL 3.7 12/24/2022   VLDL 11 03/19/2016   LDLCALC 114 (H) 12/24/2022   LDLCALC 104 (H) 12/15/2021   Lab Results  Component Value Date   TSH 2.20 12/24/2022   TSH 2.49 12/17/2020    Therapeutic Level Labs: No results found for: LITHIUM No results found for: VALPROATE No results found for: CBMZ  Current Medications: Current Outpatient Medications  Medication Sig Dispense Refill   cetirizine (ZYRTEC) 10 MG tablet Take 10 mg by mouth daily.     clindamycin  (CLEOCIN  T) 1 % lotion Apply topically 2 (two) times daily. 60  mL 2   EPINEPHrine 0.3 mg/0.3 mL IJ SOAJ injection Inject into the muscle.     fluticasone  (FLONASE ) 50 MCG/ACT nasal spray Place 2 sprays into both nostrils daily. 15.8 mL 1   folic acid  (FOLVITE ) 1 MG tablet Take 1 mg by mouth daily.     ipratropium (ATROVENT) 0.03 % nasal spray ipratropium bromide 21 mcg (0.03 %) nasal spray     metoprolol  succinate (TOPROL  XL) 25 MG 24 hr tablet Take 1 tablet (25 mg total) by mouth daily. 90 tablet 0   metroNIDAZOLE  (METROGEL ) 0.75 % vaginal gel Place vaginally 2 (two) times daily. 70 g 2   Multiple Vitamin (MULTI-VITAMINS) TABS Take by mouth.     Olopatadine  HCl 0.2 % SOLN Apply 1 drop to eye daily at 12 noon. 2.5 mL 1   topiramate  (TOPAMAX ) 50 MG tablet Take 50 mg by mouth 2 (two) times daily.     torsemide  (DEMADEX ) 20 MG tablet Take 1 tablet (20 mg total) by mouth every three (3) days as needed. 30 tablet 0   traZODone  (DESYREL ) 50 MG tablet Take 0.5-1 tablets (25-50 mg total) by mouth at bedtime as needed for sleep. 30 tablet 1   vitamin B-12 (CYANOCOBALAMIN ) 500 MCG tablet Take 500 mcg by mouth daily.     Vitamin D , Ergocalciferol , (DRISDOL ) 1.25 MG (50000 UNIT) CAPS capsule Take 50,000 Units by mouth once a week.      hydrOXYzine  (ATARAX ) 10 MG tablet Take 1 tablet (10 mg total) by mouth at bedtime as needed. 90 tablet 1   lamoTRIgine  (LAMICTAL ) 25 MG tablet Take 3 tablets (75 mg total) by mouth daily. 270 tablet 1   venlafaxine  XR (EFFEXOR  XR) 75 MG 24 hr capsule Take 1 capsule (75 mg total) by mouth daily with breakfast. 90 capsule 1   Current Facility-Administered Medications  Medication Dose Route Frequency Provider Last Rate Last Admin   betamethasone  acetate-betamethasone  sodium phosphate  (CELESTONE ) injection 3 mg  3 mg Intra-articular Once          Musculoskeletal: Strength & Muscle Tone: within normal limits Gait & Station: normal Patient leans: N/A  Psychiatric Specialty Exam: Review of Systems  Psychiatric/Behavioral: Negative.      Blood pressure 120/86, pulse 77, temperature 98.4 F (36.9 C), temperature source Temporal, height 5' 3 (1.6 m), weight 264 lb 12.8 oz (120.1 kg), SpO2 99%.Body mass index is 46.91 kg/m.  General Appearance: Fairly Groomed  Eye Contact:  Fair  Speech:  Clear and Coherent  Volume:  Normal  Mood:  Euthymic  Affect:  Appropriate  Thought Process:  Goal Directed and Descriptions of Associations: Intact  Orientation:  Full (Time, Place, and Person)  Thought Content: Logical   Suicidal Thoughts:  No  Homicidal Thoughts:  No  Memory:  Immediate;   Fair Recent;   Fair Remote;   Fair  Judgement:  Fair  Insight:  Fair  Psychomotor Activity:  Normal  Concentration:  Concentration: Fair and Attention Span: Fair  Recall:  [employer] of Knowledge: Fair  Language: Fair  Akathisia:  No  Handed:  Right  AIMS (if indicated): not done  Assets:  Desire for Improvement Housing Talents/Skills  ADL's:  Intact  Cognition: WNL  Sleep:  Fair   Screenings: AIMS    Flowsheet Row Video Visit from [facility]23/2021 in [facility]  AIMS Total Score 0      GAD-7    Flowsheet Row Office Visit from 07/21/2023 in [facility] Counselor from 06/09/2023 in [facility] Office Visit from 05/05/2023 in [facility] Office Visit from 03/17/2023 in [facility] Office Visit from 03/02/2023 in [facility]  Total GAD-7 Score 0 12 5 7 4       PHQ2-9    Flowsheet Row Office Visit from 07/21/2023 in [facility] Counselor from 06/09/2023 in [facility] Office Visit from 05/05/2023 in [facility] Office Visit from 03/17/2023 in [facility] Office Visit from 03/02/2023 in [facility]  PHQ-2 Total Score 0 4 0 2 0  PHQ-9 Total Score 0 11 -- 4 6      Flowsheet Row Office Visit from 08/11/2023 in [facility] Counselor from 06/09/2023 in [facility] Office Visit from 05/05/2023 in [facility]  C-SSRS RISK CATEGORY No Risk No Risk No Risk        Assessment and Plan:Olivia King is a 37 year old female, has a history of depression, anxiety, insomnia currently improving on current medication regimen, discussed assessment and plan as noted below.  Assessment & Plan Anxiety disorder-improving Anxiety symptoms are improving with current treatment. She is actively engaged in therapy, addressing paranoia related to her daughter, and is progressing in allowing her daughter more independence. - Continue therapy sessions with Ms. [name]. - Continue Hydroxyzine  10 mg at bedtime as needed for anxiety. - Reduce Venlafaxine  to 75 mg daily.  Patient is not compliant on the higher dosage.  Depression in remission Depression remains in remission. She maintains motivation for  therapy and adheres to her medication regimen. She has reduced venlafaxine  to 75 mg daily with stable results. - Continue Venlafaxine  75 mg daily. - Continue Lamictal  75 mg as prescribed. - Patient aware of drug-drug interaction between Topamax  and Lamictal .  Topamax  side effects can be increased when coadministered with Lamictal .  Insomnia-stable Currently reports sleep is overall good. - Continue Trazodone  25-50 mg at bedtime as needed.  Follow-up - Follow-up in clinic in 3 months or sooner if needed.   Collaboration of Care: Collaboration of Care: Referral or follow-up with counselor/therapist AEB encouraged to continue CBT  Patient/Guardian was advised Release of Information must be obtained prior to any record release in order to collaborate their care with an outside provider. Patient/Guardian was advised if they have not already done so to contact the registration department to sign all necessary forms in order for us  to release information regarding their care.   Consent: Patient/Guardian gives verbal consent for treatment and assignment of benefits for services provided during this visit. Patient/Guardian expressed understanding and agreed to proceed.  This note was generated in part or whole with voice recognition software. Voice recognition is usually quite accurate but there are transcription errors that can and very often do occur. I apologize for any typographical errors that were not detected and corrected.     Naphtali Riede, MD 08/12/2023, 7:58 AM

## 2023-08-12 ENCOUNTER — Other Ambulatory Visit: Payer: Self-pay | Admitting: Family Medicine

## 2023-08-12 DIAGNOSIS — J301 Allergic rhinitis due to pollen: Secondary | ICD-10-CM

## 2023-08-12 MED ORDER — LORATADINE 10 MG PO TABS
10.0000 mg | ORAL_TABLET | Freq: Every day | ORAL | 1 refills | Status: AC
Start: 2023-08-12 — End: ?

## 2023-08-12 MED ORDER — MONTELUKAST SODIUM 10 MG PO TABS
10.0000 mg | ORAL_TABLET | Freq: Every day | ORAL | 1 refills | Status: DC
Start: 2023-08-12 — End: 2023-12-29

## 2023-08-21 ENCOUNTER — Other Ambulatory Visit: Payer: Self-pay | Admitting: Psychiatry

## 2023-08-21 DIAGNOSIS — F3342 Major depressive disorder, recurrent, in full remission: Secondary | ICD-10-CM

## 2023-09-01 ENCOUNTER — Ambulatory Visit (INDEPENDENT_AMBULATORY_CARE_PROVIDER_SITE_OTHER): Admitting: Professional Counselor

## 2023-09-01 DIAGNOSIS — Z8249 Family history of ischemic heart disease and other diseases of the circulatory system: Secondary | ICD-10-CM | POA: Diagnosis not present

## 2023-09-01 DIAGNOSIS — F411 Generalized anxiety disorder: Secondary | ICD-10-CM | POA: Diagnosis not present

## 2023-09-01 DIAGNOSIS — G932 Benign intracranial hypertension: Secondary | ICD-10-CM | POA: Diagnosis not present

## 2023-09-01 DIAGNOSIS — H539 Unspecified visual disturbance: Secondary | ICD-10-CM | POA: Diagnosis not present

## 2023-09-01 NOTE — Progress Notes (Signed)
 THERAPIST PROGRESS NOTE  Virtual Visit via Video Note  I connected with Olivia King on 09/01/23 at  8:00 AM EDT by a video enabled telemedicine application and verified that I am speaking with the correct person using two identifiers.  Location: Patient: Work  Provider: Office   I discussed the limitations of evaluation and management by telemedicine and the availability of in person appointments. The patient expressed understanding and agreed to proceed.  I discussed the assessment and treatment plan with the patient. The patient was provided an opportunity to ask questions and all were answered. The patient agreed with the plan and demonstrated an understanding of the instructions.   The patient was advised to call back or seek an in-person evaluation if the symptoms worsen or if the condition fails to improve as anticipated.  I provided 42 minutes of non-face-to-face time during this encounter. Olivia King, Olivia King  Session Time: 8:01 AM - 8:43 AM   Participation Level: Active  Behavioral Response: Well Groomed, Alert, Anxious  Type of Therapy: Individual Therapy  Treatment Goals addressed:   Active OP Depression  LTG: "Whenever I get into that deep depression, to be able to pull myself out in a short manner instead of letting it linger. Ways to forget thinking about the past."               Start:  07/09/23    Expected End:  07/07/24      STG: "I'm gonna be honest with you, it's my kid. She's always mean and angry." To improve interpersonal effectiveness AEB increased positive interactions and decrease in negative emotional responses over the next 90 days.     STG: "Maybe coping skills." To increase knowledge of coping skills AEB psychoeducation and practice of skills over the next 90 days.    ProgressTowards Goals: Progressing  Interventions: CBT and Supportive  Summary: Olivia King is a 38 y.o. female who presents with a history of anxiety and depression.  She appeared alert and oriented x5. She stated her son is home from college for the summer and plans to transfer to a [employer] school. She reported an argument with her daughter last night. Olivia King hasn't been able to schedule her daughter for therapy yet. She reported an ongoing issue with her daughter vacillating on decisions. She was receptive to cycle of avoidance and identified how this is something she struggles with as well. Olivia King discussed a recent incident at work that brought up her struggle with perfectionism. She was receptive to restructuring thoughts about perfectionism.   Therapist Response: Conducted session with Olivia King. Began session with check-in/update since previous session. Utilized empathetic and reflective listening. Provided psychoeducation on cycle of avoidance/anxiety trap. Explored recent work incident and assisted with checking the facts for emotion regulation. Highlighted black/white thinking - I must be perfect or I'm a failure." Assisted with restructuring thinking to allow Olivia King to make mistakes without being a failure, such as "better done than perfect" and "I'm doing my best." Emailed copy of cycle of avoidance to [place] to share with her daughter. Scheduled additional appointment and concluded session.   Suicidal/Homicidal: No  Plan: Return again in 4 weeks.  Diagnosis: GAD (generalized anxiety disorder)  Collaboration of Care: Medication Management AEB chart review  Patient/Guardian was advised Release of Information must be obtained prior to any record release in order to collaborate their care with an outside provider. Patient/Guardian was advised if they have not already done so to contact the registration department to sign all  necessary forms in order for us  to release information regarding their care.   Consent: Patient/Guardian gives Olivia King consent for treatment and assignment of benefits for services provided during this visit. Patient/Guardian expressed  understanding and agreed to proceed.   Olivia King, Olivia King 09/01/2023

## 2023-09-02 ENCOUNTER — Other Ambulatory Visit: Payer: Self-pay

## 2023-09-02 DIAGNOSIS — G932 Benign intracranial hypertension: Secondary | ICD-10-CM

## 2023-09-11 ENCOUNTER — Ambulatory Visit
Admission: RE | Admit: 2023-09-11 | Discharge: 2023-09-11 | Disposition: A | Discharge status: Home / Self Care | Source: Ambulatory Visit

## 2023-09-11 DIAGNOSIS — G932 Benign intracranial hypertension: Secondary | ICD-10-CM | POA: Diagnosis not present

## 2023-09-11 DIAGNOSIS — I671 Cerebral aneurysm, nonruptured: Secondary | ICD-10-CM | POA: Diagnosis not present

## 2023-09-29 ENCOUNTER — Ambulatory Visit (INDEPENDENT_AMBULATORY_CARE_PROVIDER_SITE_OTHER): Admitting: Professional Counselor

## 2023-09-29 DIAGNOSIS — F331 Major depressive disorder, recurrent, moderate: Secondary | ICD-10-CM

## 2023-09-29 DIAGNOSIS — F411 Generalized anxiety disorder: Secondary | ICD-10-CM | POA: Diagnosis not present

## 2023-09-29 DIAGNOSIS — F439 Reaction to severe stress, unspecified: Secondary | ICD-10-CM

## 2023-09-29 NOTE — Progress Notes (Signed)
 THERAPIST PROGRESS NOTE  Virtual Visit via Video Note  I connected with Olivia King on 09/29/23 at  8:00 AM EDT by a video enabled telemedicine application and verified that I am speaking with the correct person using two identifiers.  Location: Patient: Home Provider: Office   I discussed the limitations of evaluation and management by telemedicine and the availability of in person appointments. The patient expressed understanding and agreed to proceed.   I discussed the assessment and treatment plan with the patient. The patient was provided an opportunity to ask questions and all were answered. The patient agreed with the plan and demonstrated an understanding of the instructions.   The patient was advised to call back or seek an in-person evaluation if the symptoms worsen or if the condition fails to improve as anticipated.  I provided 54 minutes of non-face-to-face time during this encounter. Len Quale, [facility]  Session Time: 8:01 AM - 8:55 AM  Participation Level: Active  Behavioral Response: Casual, Alert, Dysphoric  Type of Therapy: Individual Therapy  Treatment Goals addressed:  Active OP Depression  LTG: Whenever I get into that deep depression, to be able to pull myself out in a short manner instead of letting it linger. Ways to forget thinking about the past.               Start:  07/09/23    Expected End:  07/07/24      STG: I'm gonna be honest with you, it's my kid. She's always mean and angry. To improve interpersonal effectiveness AEB increased positive interactions and decrease in negative emotional responses over the next 90 days.     STG: Maybe coping skills. To increase knowledge of coping skills AEB psychoeducation and practice of skills over the next 90 days.    ProgressTowards Goals: Progressing  Interventions: CBT, Motivational Interviewing, and Supportive  Summary: Olivia King is a 38 y.o. female who presents with a history of  anxiety and depression. 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 Jozelynn reported she was fired from her job. She has an appeal next Friday. She shared the circumstances around her termination. Laine engaged in discussion about her struggles with emotions, triggers, and contributing factors. She shared thoughts on switching to a different career field. Geneen was pleased to share she was able to go somewhere without researching it first. She noted the anxiety wasn't even bad. She scored 57 on PCL screening. She actively listened to information on PTSD and stuck points. Ellouise expressed understanding about homework assignment - impact statement.  Therapist Response: Conducted session with [employer]. Began session with check-in/update since previous session. Utilized empathetic and reflective listening. Explored and processed Olivia King's thoughts/feelings and previous experiences that contribute to her struggle with emotions/behavior. Praised Therapist, music for completing homework assignment. Administered PCL screening. Provided psychoeducation on PTSD and stuck points. Explained homework assignment and emailed workbook to [place]. Scheduled additional appointment and concluded session.   Suicidal/Homicidal: No  Plan: Return again in 3 weeks.  Diagnosis: MDD (major depressive disorder), recurrent episode, moderate (HCC)  GAD (generalized anxiety disorder)  Trauma and stressor-related disorder  Collaboration of Care: Medication Management AEB chart review  Patient/Guardian was advised Release of Information must be obtained prior to any record release in order to collaborate their care with an outside provider. Patient/Guardian was advised if they have not already done so to contact the registration department to sign all necessary forms in order for us  to release information regarding their care.   Consent: Patient/Guardian gives  verbal consent for treatment and assignment of benefits for  services provided during this visit. Patient/Guardian expressed understanding and agreed to proceed.   Len Quale, [facility] 09/29/2023

## 2023-10-18 ENCOUNTER — Ambulatory Visit (INDEPENDENT_AMBULATORY_CARE_PROVIDER_SITE_OTHER): Admitting: Professional Counselor

## 2023-10-18 DIAGNOSIS — F439 Reaction to severe stress, unspecified: Secondary | ICD-10-CM | POA: Diagnosis not present

## 2023-10-18 DIAGNOSIS — F331 Major depressive disorder, recurrent, moderate: Secondary | ICD-10-CM

## 2023-10-18 DIAGNOSIS — F411 Generalized anxiety disorder: Secondary | ICD-10-CM

## 2023-10-18 NOTE — Progress Notes (Signed)
 THERAPIST PROGRESS NOTE  Virtual Visit via Video Note  I connected with Olivia King on 10/18/23 at 11:00 AM EDT by a video enabled telemedicine application and verified that I am speaking with the correct person using two identifiers.  Location: Patient: Home Provider: Remote office   I discussed the limitations of evaluation and management by telemedicine and the availability of in person appointments. The patient expressed understanding and agreed to proceed.  I discussed the assessment and treatment plan with the patient. The patient was provided an opportunity to ask questions and all were answered. The patient agreed with the plan and demonstrated an understanding of the instructions.   The patient was advised to call back or seek an in-person evaluation if the symptoms worsen or if the condition fails to improve as anticipated.  I provided 29 minutes of non-face-to-face time during this encounter. Olivia King, [facility]  Session Time: 11:03 AM - 11:32 AM   Participation Level: Active  Behavioral Response: Well Groomed, Alert, Dysphoric  Type of Therapy: Individual Therapy  Treatment Goals addressed: Active OP Depression  LTG: Whenever I get into that deep depression, to be able to pull myself out in a short manner instead of letting it linger. Ways to forget thinking about the past.               Start:  07/09/23    Expected End:  07/07/24      STG: I'm gonna be honest with you, it's my kid. She's always mean and angry. To improve interpersonal effectiveness AEB increased positive interactions and decrease in negative emotional responses over the next 90 days.     STG: Maybe coping skills. To increase knowledge of coping skills AEB psychoeducation and practice of skills over the next 90 days.   ProgressTowards Goals: Progressing  Interventions: CBT, Motivational Interviewing, Solution Focused, and Supportive  Summary: Olivia King is a 38 y.o. female  who presents with a history of anxiety, depression, and trauma-and-stressor-related disorder. She appeared somber but oriented x5. She reported she was denied her job appeal. She has been busy doing things around the house. Olivia King has been considering going back to school but is uncertain what she wants to do. She was receptive to thinking about her wants and took note of resources to help make her decision. She did not complete homework assignment. She shared pattern of dissociating and compartmentalizing to avoid stress/overwhelm. She also shared a pattern of skin picking on her fingers and feet. Olivia King will pick up a paper copy of workbook and agreed to complete assignment before next session.   Therapist Response: Conducted session with [employer]. Began session with check-in/update since previous session. Utilized empathetic and reflective listening. Explored thoughts behind future goals. Provided resources for career counseling ([employer], [employer]). Processed Olivia King's struggle with compartmentalizing. Agreed to print copy of workbook for Olivia King to pick up and fill out. Scheduled additional appointment and concluded session.   Suicidal/Homicidal: No  Plan: Return again in 1 weeks.  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 Patient/Guardian was advised if they have not already done so to contact the registration department to sign all necessary forms in order for us  to release information  regarding their care.   Consent: Patient/Guardian gives verbal consent for treatment and assignment of benefits for  services provided during this visit. Patient/Guardian expressed understanding and agreed to proceed.   Olivia King, [facility] 10/18/2023

## 2023-10-21 ENCOUNTER — Other Ambulatory Visit: Payer: Self-pay | Admitting: Family Medicine

## 2023-10-21 DIAGNOSIS — R000 Tachycardia, unspecified: Secondary | ICD-10-CM

## 2023-10-27 ENCOUNTER — Ambulatory Visit: Admitting: Professional Counselor

## 2023-10-27 NOTE — Progress Notes (Signed)
 Ref Provider: Sowels, Krichna  PCP: Sowles, Krichna F, MD  Assessment and Plan:   In most patients we give written parts of assessment and plan to patient under Patient Instructions/After Visit Summary. So some parts are directed to patient.  Dear Olivia King, It was our pleasure to participate in your care. We have typed up brief summary of what we discussed. 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 MRI indicates venous sinus narrowing. Symptoms worsen with Valsalva maneuvers. Family history of intracranial aneurysms. Current management includes topiramate  and metoprolol . Topiramate  reduces headache frequency but not tinnitus. Metoprolol  initially improved tinnitus, but symptoms returned. Discussed teratogenic risks of topiramate , including neural tube defects, and risks of kidney stones and cognitive effects.  Right pulsatile tinnitus which gets better with right jugular compression and worse with valsalva in a patient who has gained weight, initially concerning for right venous sinus stenosis - however, patient has bilateral narrowed transverse sinuses bilaterally + mildly prominent optic nerve sheaths + partially empty sella, on chronic Doxycycline  - concerning for intracranial hypertension. Patient had Lumbar Puncture on 09/09/2021 with opening pressure of 21 cm H2O and reports some improvement of symptoms. Status post blood patch with Dr. Margart Richards Continue to follow with ophthalmology.  Chronic migraines exacerbated by heat exposure, rated as 10/10 in severity. Excedrin migraine provides partial relief if taken early. Previous Emgality use was discontinued; however, she would like to resume. We discussed considering alternative CGRP inhibitors due to lack of improvement with Emgality. Discussed Ajovy  as an alternative with similar side  effects and administration as Emgality. Introduced Arts development officer as a rescue medication, explaining its use and dosing strategy.  - Start Ajovy  subcutaneous injection once a month. Fremanezumab  (Ajovy ) is a humanized monoclonal antibody to CGRP (calcitonin gene-related peptide), given as a subcutaneous injection for the preventive treatment of migraine in adults. It is used as 225 mg/month or 675 mg/3 months subcutaneous injection in the abdomen, thigh, or upper arm. The most common side effect is injection site reaction.  Keep Fremanezumab  in the refrigerator (36 to 46 F) during storage, out of sunlight, don't shake, don't freeze, before injecting let it sit at room temperature for 30 min, discard in [place] disposal container after use. See detailed injection instructions on www.ajovy .com. The patient may be able to get a copay card on their website as well.  - Start Ubrelvy 100 mg tablet: 1 tablet once as needed for acute migraine attacks. Start with half a tablet (50 mg total), may take another half in 2 hours if needed. If ineffective, use a full tablet next time. [place] (ubrogepant) is useful as rescue treatment of migraine with or without aura in adults. In addition to pain it may help with light sensitivity, noise sensitivity and nausea. Ubrogepant blocks CGRP (calcitonin gene related peptide) receptor. It should not be used if you are taking ketoconazole, itraconazole or clarithromycin. It is not studied in pregnant and breast feeding woman, so they should avoid it. Ubrelvy 100 mg by mouth as needed at onset or few hours into onset of headache (patient can try 50 mg dose first, if that doesn't help can take 100 mg.) If no improvement with 100 mg dose - can repeat 2nd dose in 2 hours. No more than 200 mg / day. Insurance usually allows 16 pills/month but ask your pharmacy what maximum number of pills allowed per your insurance. There are additional resources to help cover  the cost at  https://www.ham[facility].com/ or text Ubrelvy to (604)838-7136. For additional questions please call 1.844.4.Jere  - Continue Topiramate  150 mg at night, refill   - Previously on Torsemide  as needed for lower extremity edema, not currently taking  - Previously on Spironolactone for hidradenitis suppurativa, not currently taking  - Discussed repeat Lumbar Puncture as future treatment option, will avoid if possible per patient preference. Required blood patch with last LP. Opening pressure was 21 at the time.  - Previous MRV showed venous sinus stenosis, which may contribute to headache frequency and severity. Will refer to neurosurgeon for evaluation of venous stenting for venous sinus stenosis.  - Consider increasing topiramate  to 200 mg if metoprolol  adjustment is ineffective. Consider long acting Topiramate   - Monitor for topiramate  side effects: teratogenic risks discussed, kidney stones, cognitive effects, paresthesias  - Consider repeat LP. Patient deferred  - Advise avoiding heavy lifting and Valsalva maneuvers.  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 Initial improvement with metoprolol , but symptoms persist. No significant changes in auditory examination. - Tried and failed increased dose of Metoprolol : no improvement in pulsatile tinnitus  - Consider increasing topiramate  to 200 mg if metoprolol  adjustment is ineffective.  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 No new neurological deficits. - Contact ophthalmologist to discuss prescription and visual symptoms. - Evaluate potential metoprolol  adjustment for symptom management.  Blurry vision Blurry vision with difficulty adjusting to new glasses. Vision changes include slanted perception with glasses. No acute changes in visual acuity. - Contact ophthalmologist to discuss current glasses  prescription and potential adjustment.  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 - Refer to neurosurgeon for evaluation of venous stenting. Venous sinus stenosis means the veins/vessels in your brain that move blood from the brain to the heart are more narrow. This can increase pressure and result in symptoms like headache, visual changes, and ringing/pulsing in the ear. In some cases, stenting these blood vessels (opening them up) can improve symptoms of intracranial hypertension.   Pulsatile Tinnitus Pulsatile tinnitus likely related to venous sinus stenosis. - Refer to neurosurgeon for evaluation of venous stenting as above  Carpal Tunnel Syndrome Numbness and tingling in hands, likely due to carpal tunnel syndrome and possibly exacerbated by topiramate . Previous injections with [facility] provided relief; however, notes are unavailable for my review. She prefers to start with right hand injection. - Schedule right hand carpal tunnel injection with [facility]    MRA Head without contrast 09/11/2023 - 1. Normal   Return in about 3 months (around 01/27/2024) for Follow-Up of your Symptoms, With Allyson Stallion, NP.   Interim History date 10/27/2023   Olivia King is a 38 y.o. female here for treatment and evaluation of chronic idiopathic intracranial hypertension with Valsalva-induced headaches  Olivia King last visit was on 09/01/2023  Patient states headaches are worse. She reports summer months are worse 4-5 a month and they last up to 6-12 hours. Migraines are worse with heat. Vision has not changed.  History of Present Illness She experiences persistent headaches that are exacerbated by actions such as coughing, sneezing, and laughing. These headaches have been present since last year, following a spinal tap. She  describes the headaches as 'really tight' and notes that they ease off after  the action stops. She is currently taking topiramate  150 mg daily, which she resumed last year after finding it helpful. She experiences pulsatile tinnitus, primarily on the left side, described as a 'whooshing' sound. This symptom has persisted despite the use of topiramate . She was prescribed metoprolol , which initially reduced the intensity of the tinnitus, but the symptom returned, albeit at a lower intensity. Her heart rate normalized after weight loss, but she regained some weight. She reports double vision, particularly in the left eye, ongoing for over a year. She also experiences blurry vision and has been prescribed glasses, but finds it difficult to adjust to them as they cause a 'mind tricking' effect. With the glasses, everything appears slanted on one side. She mentions a history of lower back pain and was told she has degenerative disc disease. She notes that her left side is weaker than her right, despite working out, and experiences leg shaking during exercises like lunging. She is right-hand dominant. She has a family history of intracranial aneurysms, with her grandmother having had two aneurysms.  She has experienced migraines since the age of [age], with recent exacerbation, particularly in hot weather. The headaches worsen when transitioning from hot to cold environments, such as moving from outside into an air-conditioned vehicle. She describes these headaches as similar to her usual migraines, rating them a ten on a severity scale of one to ten. Excedrin Migraine is effective if taken at the onset of symptoms, but if the headache progresses, she sometimes exceeds the recommended dosage without relief. She previously used Emgality but stopped and switched back to Topamax . She is currently taking 150 mg of topiramate  at night and has requested a refill for Emgality.  She has a history of idiopathic intracranial  hypertension and previously underwent a lumbar puncture, which resulted in a post-lumbar puncture headache requiring a blood patch. An MRV showed venous sinus stenosis. She experiences a 'whooshing' sound in her ear. No new vision changes, but her vision remains the same as before.  She is currently taking 25 mg of metoprolol  at night. She attempted to increase her metoprolol  dosage independently but found it ineffective.  She experiences numbness and tingling in her hands, which she attributes to carpal tunnel syndrome and possibly topiramate . She previously received injections for carpal tunnel syndrome at [facility] and is considering repeating the injections, starting with her right hand.  She has been taking Topiramate  150 mg nightly since her last visit.   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 , referred by Dr. Wiliam.  Ideopathic Intracranial Hypertension: Right pulsatile tinnitus which gets better with right jugular compression and worse with valsalva in a patient who has gained weight, initially concerning for right venous sinus stenosis - however, patient has bilateral narrowed transverse sinuses bilaterally + mildly prominent optic nerve sheaths + partially empty sella, on chronic Doxycycline  - concerning for intracranial hypertension. Patient had Lumbar Puncture on 09/09/2021 with opening pressure of 21 cm H2O and reports some improvement of symptoms. Status post blood patch with Dr. Margart Richards - following ophthalmology: Patient reports that she is here for constant beating pulse in right ear. Patient reports pressing on her jugular vein makes it go away. She reports bearing down makes it worse. She reports that it is annoying and disruptive, especially when she is trying to sleep.  Patient denies associated headaches, taking any new  medication, or any major life  event/stressful situation. Patient reports she recently gained a little above 40 pounds.   Reports COVID in 03/2020 and 11/2020 in a vaccinated but not boosted patient. She reports she had vertigo following her second COVID infection, which has improved. She saw ENT for this.  Patient reports history of migraines and she reports memory loss when she has them. She reports she had to pull over and she couldn't use her phone or remember where she was. It passed after about an hour. Reports flavored water, diet sodas, artifical sweeteners are a migraine trigger.  Patient presents for urgent work-in on 07/23/2021 for headache lasting 4 days. Describes as right sided head pressure/periorbital eye pain. Reports word-finding difficulty, transposing words, forgetting routine tasks (e.g., packing lunch items). Headache onset 07/18/2021, described as as pressure headache, not as severe as migraine, without vomiting, photophobia, phonophobia. Noticed word finding difficulty 2 - 3  Weeks ago, middle of March 2023, memory loss, 2 weeks. Does not feel like increased topiramate  has caused cognitive side effects. Has history of migraine with cognitive aura. Denies other focal neurologic deficit/symptom. No new neurologic symptoms otherwise.   Olivia King, a 38 year old with idiopathic recurrent hypertension and migraine vertigo, presents with a chief complaint of sharp headache pain that is triggered by coughing or sneezing. The patient has been trialing Emgality for two months without noticeable improvement. She is scheduled to pick up her third dose soon. The patient distinguishes between her migraines and other headaches, noting that the migraines have decreased in frequency. She has a history of idiopathic intracranial hypertension, confirmed by a previous lumbar puncture with an opening pressure of 21 and imaging showing an empty sella. However, the lumbar puncture did not result in significant headache improvement  and required a subsequent epidural blood patch. Previously, the patient was on acetazolamide (Diamox), but it was discontinued due to kidney spasms. She has also previously taken topiramate , which she is open to trying again. The patient has a history of taking Wegovy  and Ozempic , but these were discontinued due to insurance issues. The patient has also been experiencing changes in vision, feeling as though her eyes are crossing, though this has not been confirmed by others. She has seen an eye doctor, who reported normal findings. The patient does not report any aches, pains, or excessive sugar consumption.  MRI/MRV remarkable for partially empty sella, with mildly prominent optic nerve sheaths and narrow distal transverse sinuses bilaterally out evidence of venous sinus thrombosis. Continues to experience tinnitus.   MRI Brain/MRV Head With and Without Contrast 05/27/2021: Partially empty sella, mildly prominent optic nerve sheaths and narrowed distal transverse sinuses bilaterally. While nonspecific, the constellation of findings can be seen with idiopathic intracranial hypertension which can cause pulsatile tinnitus. Consider lumbar puncture with opening pressure for further  evaluation. Otherwise, no evidence of acute intracranial abnormality. No evidence of dural venous sinus thrombosis.    Lumbar Puncture for opening pressure 09/09/2021: Technically successful epidural blood patch at L2-L3 using fluoroscopic guidance.   IgG, CSF 0.0 - 6.7 mg/dL 2.9  Albumin CSF-mCnc 7 - 29 mg/dL 14  IgG (Immunoglobin G), Serum 586 - 1602 mg/dL 8,535  Albumin 3.8 - 4.8 g/dL 3.9  IgG/Alb Ratio, CSF 0.00 - 0.25 0.21  CSF IgG Index 0.0 - 0.7 0.6   Color, CSF COLORLESS COLORLESS  Appearance, CSF CLEAR CLEAR Abnormal   RBC Count, CSF 0 - 3 /cu mm 30 High   WBC, CSF 0 - 5 /cu mm 2  Segmented Neutrophils-CSF % 3  Lymphs, CSF % 84  Monocyte-Macrophage-Spinal Fluid % 13  Eosinophils, CSF % 0   Medications:  Topiramate , Torsemide  as needed for lower extremity edema, Spironolactone for hidradenitis suppurativa, diamox, Emgality  History of migraine with cognitive aura - last migraine aura in about 2018: Suspect recent episode of cognitive fog/memory difficulty to be representative of migraine with cognitive aura without headache versus side effects of Topiramate .  Medications:  Preventative: Topiramate , Venlafaxine , Diamox, Lamotrigine , Spironolactone, Torsemide , Emgality Rescue: tylenol , Excedrin migraine   Vertigo following COVID 11/2020 - improved, status post ENT evaluation.  Hypovitaminosis - Vitamin B12 + Vitamin D : Continue supplementing - started early April 2023. Vitamin B12 (05/19/2021) - 251 Vitamin D  (05/19/2021) - 11.5  Family history of aneurysm MR MRA head without contrast 08/17/2012: Normal  Hidradenitis Suppurativa: Taking Spironolactone. Previously taking Doxycycline  (discontinued).  Physical Exam   Vitals There were no vitals filed for this visit.       There is no height or weight on file to calculate BMI.  (Some of the exam changes noted are from previous clinical observations)  General Exam   Neurological Exam   Medications: Current Outpatient Medications on File Prior to Visit  Medication Sig Dispense Refill  . cetirizine (ZYRTEC) 10 MG tablet Take 10 mg by mouth once daily    . cholecalciferol (VITAMIN D3) 1000 unit tablet Take 1,000 Units by mouth    . clindamycin  (CLEOCIN  T) 1 % lotion Apply topically 2 (two) times daily    . cyanocobalamin  (VITAMIN B12) 1000 MCG tablet Take 1,000 mcg by mouth once daily    . EPINEPHrine (EPIPEN) 0.3 mg/0.3 mL auto-injector Inject into the muscle    . fluticasone  propionate (FLONASE ) 50 mcg/actuation nasal spray SHAKE LIQUID AND USE 2 SPRAYS IN EACH NOSTRIL DAILY    . folic acid  (FOLVITE ) 1 MG tablet Take 1 mg by mouth once daily    . hydrOXYzine  HCL (ATARAX ) 10 MG tablet hydroxyzine  HCl 10 mg tablet    . lamoTRIgine   (LAMICTAL ) 25 MG tablet Take 75 mg by mouth once daily    . loratadine  (CLARITIN ) 10 mg tablet Take 1 tablet by mouth once daily    . metoprolol  SUCCinate (TOPROL -XL) 25 MG XL tablet Take 1 tablet by mouth once daily    . metroNIDAZOLE  (METROGEL ) 0.75 % (37.5mg /5 gram) vaginal gel Place vaginally 2 (two) times daily    . montelukast  (SINGULAIR ) 10 mg tablet Take 10 mg by mouth at bedtime    . multivitamin tablet Take 1 tablet by mouth once daily    . ondansetron  (ZOFRAN ) 4 MG tablet Take 4 mg by mouth every 8 (eight) hours as needed    . TORsemide  (DEMADEX ) 20 MG tablet Take 20 mg by mouth    . traZODone  (DESYREL ) 50 MG tablet Take by mouth    . triamcinolone  0.1 % cream APPLY TOPICALLY TO THE AFFECTED AREA TWICE DAILY FOR SPOT TREATMENT    . venlafaxine  (EFFEXOR -XR) 75 MG XR capsule Take by mouth    . ergocalciferol , vitamin D2, 1,250 mcg (50,000 unit) capsule Take 1 capsule (50,000 Units total) by mouth once a week for 60 days Take 1000 IU Vit D at the completion of weekly course. 8 capsule 0  . ibuprofen  (MOTRIN ) 800 MG tablet     . lamoTRIgine  (LAMICTAL ) 25 MG tablet Take 50 mg by mouth once daily (Patient not taking: Reported on 10/27/2023)     No current facility-administered medications on file prior to visit.  Past Medical History:  Past Medical History:  Diagnosis Date  . Depression   . Migraine headache     Past Surgical History: No past surgical history on file. Family History:  Family History  Problem Relation Name Age of Onset  . High blood pressure (Hypertension) Mother    . Cancer Mother    . Aneurysm Mother    . Cancer Father    . High blood pressure (Hypertension) Sister    . High blood pressure (Hypertension) Brother    . Multiple sclerosis Paternal Uncle    . Brain hemorrhage Maternal Grandmother    . Aneurysm Maternal Grandmother    . High blood pressure (Hypertension) Paternal Grandmother     Social History:  Social History   Socioeconomic History  .  Marital status: Widowed  Tobacco Use  . Smoking status: Every Day    Types: Cigarettes  . Smokeless tobacco: Current  Vaping Use  . Vaping status: Unknown  Substance and Sexual Activity  . Alcohol use: Yes  . Drug use: Never  . Sexual activity: Yes   Social Drivers of Corporate investment banker Strain: Low Risk  (06/09/2023)   Received from [facility]   Overall Financial Resource Strain (CARDIA)   . Difficulty of Paying Living Expenses: Not hard at all  Food Insecurity: No Food Insecurity (06/09/2023)   Received from [facility]   Hunger Vital Sign   . Within the past 12 months, you worried that your food would run out before you got the money to buy more.: Never true   . Within the past 12 months, the food you bought just didn't last and you didn't have money to get more.: Never true  Transportation Needs: No Transportation Needs (06/09/2023)   Received from [facility] - Transportation   . Lack of Transportation (Medical): No   . Lack of Transportation (Non-Medical): No  Physical Activity: Inactive (06/09/2023)   Received from [facility]   Exercise Vital Sign   . On average, how many days per week do you engage in moderate to strenuous exercise (like a brisk walk)?: 0 days   . On average, how many minutes do you engage in exercise at this level?: 0 min  Stress: Stress Concern Present (06/09/2023)   Received from [facility] - Occupational Stress Questionnaire   . Feeling of Stress : To some extent  Social Connections: Moderately Isolated (06/09/2023)   Received from [facility]   Social Connection and Isolation Panel   . In a typical week, how many times do you talk on the phone with family, friends, or neighbors?: More than three times a week   . How often do you get together with friends or relatives?: Once a week   . How often do you attend church or religious services?: 1 to 4 times per year   . Do you belong to any  clubs or organizations such as church groups, unions, fraternal or athletic groups, or school groups?: No   . How often do you attend meetings of the clubs or organizations you belong to?: Never   . Are you married, widowed, divorced, separated, never married, or living with a partner?: Widowed  Housing Stability: Unknown (05/05/2023)   Housing Stability Vital Sign   . Homeless in the Last Year: No   Allergies:  Allergies  Allergen Reactions  . Sumatriptan  Anaphylaxis    Throat and tongue swelling  Throat  and tongue swelling    . Penicillins Rash  . Vortioxetine  Other (See Comments)    Tingling hands and blurred vision  Tingling hands and blurred vision     This video encounter was conducted with the patient's (or proxy's) verbal consent via secure, interactive audio and video telecommunications while away from clinic/office/hospital.  The patient (or proxy) was instructed to have this encounter in a suitably private space and to only have persons present to whom they give permission to participate. In addition, patient identity was confirmed by use of name plus two identifiers.  This visit was coded based on medical decision making (MDM).  This note has been created using automated tools and reviewed for accuracy by KAITLIN CAFFARO.  Parts of this note were created with a combination of Dragon dictation and automated scribing services. Please note any typographical errors are unintentional. I apologize for any typographical errors that were not detected and corrected.   Return in about 3 months (around 01/27/2024) for Follow-Up of your Symptoms, With Allyson Stallion, NP. The patient will contact us  earlier if severity of neurologic symptoms increases or changes. This may or may not, depending on description of symptoms, necessitate an earlier appointment.    Payor: TRICARE / Plan: [employer] [place] [employer] / Product Type: Indemnity /    Attestation Statement:   I personally  performed the service, non-incident to. (WP)   KAITLIN CAFFARO, PA   Kaitlin Caffaro, PA-C Board Certified by [facility] - Neurology  A [facility] Practice

## 2023-11-03 ENCOUNTER — Other Ambulatory Visit: Payer: Self-pay

## 2023-11-03 ENCOUNTER — Ambulatory Visit (INDEPENDENT_AMBULATORY_CARE_PROVIDER_SITE_OTHER): Admitting: Neuroradiology

## 2023-11-03 VITALS — BP 137/91 | HR 96 | Ht 63.0 in | Wt 262.0 lb

## 2023-11-03 DIAGNOSIS — H93A9 Pulsatile tinnitus, unspecified ear: Secondary | ICD-10-CM

## 2023-11-03 DIAGNOSIS — G932 Benign intracranial hypertension: Secondary | ICD-10-CM | POA: Diagnosis not present

## 2023-11-03 DIAGNOSIS — H539 Unspecified visual disturbance: Secondary | ICD-10-CM

## 2023-11-03 DIAGNOSIS — I871 Compression of vein: Secondary | ICD-10-CM

## 2023-11-03 DIAGNOSIS — R519 Headache, unspecified: Secondary | ICD-10-CM

## 2023-11-03 MED ORDER — ASPIRIN 325 MG PO TBEC
325.0000 mg | DELAYED_RELEASE_TABLET | Freq: Every day | ORAL | 0 refills | Status: DC
  Filled 2023-11-03: qty 45, 45d supply, fill #0

## 2023-11-03 MED ORDER — CLOPIDOGREL BISULFATE 75 MG PO TABS
75.0000 mg | ORAL_TABLET | Freq: Every day | ORAL | Status: DC
Start: 2023-11-03 — End: 2023-11-18

## 2023-11-03 NOTE — Progress Notes (Signed)
 Progress Note: Referring Physician:  Caffaro, Kaitlin T, PA-C [address] [place],  [place] [address]  Primary Physician:  Glenard Mire, MD  Chief Complaint: Audible pulsations in the right ear, blurred vision  History of Present Illness: Olivia King is a 38 y.o. female who presents with the chief complaint of audible pulsations in the right ear.  She also complains of blurred vision.  She has had the pulsations for about 5 years.  There is a whooshing sound which follows her heart rate.  The sound goes away when she compresses her jugular vein and the neck.  It improved with a lumbar puncture.  She had severe headaches after the lumbar puncture and then had a blood patch.  The blood patch fixed the headaches but then the noise came back immediately.  I reviewed her MR angiogram from 09/11/2023, as well as her brain MRI with contrast from January 02, 2023.  Her arteries are normal.  She has a dominant right transverse/sigmoid sinus with a stenosis of the right transverse/sigmoid sinus junction.  She is overweight with a BMI of 46.4.  The opening pressure on her lumbar puncture was 20 cm of water.  She does have a history of migraine headaches since she was 38 years old.  These often times do respond to migraine treatments such as Excedrin, but not always.  She has been on a number of different preventative medications with variable response.  Review of Systems:  Review of Systems  HENT:  Positive for tinnitus.   Eyes:  Positive for blurred vision.  Respiratory:  Negative for shortness of breath.   Cardiovascular:  Negative for chest pain.  Gastrointestinal:  Negative for blood in stool.  Genitourinary:  Negative for hematuria.  Neurological:  Positive for headaches.   She quit smoking.  She does continue to vape.  Exam: Today's Vitals   11/03/23 1425  BP: (!) 137/91  Pulse: 96  Weight: 262 lb (118.8 kg)  Height: 5' 3 (1.6 m)   Body mass index is 46.41  kg/m.   Physical Exam Vitals reviewed: Overweight.  Constitutional:      Appearance: Normal appearance.  HENT:     Mouth/Throat:     Mouth: Mucous membranes are moist.     Pharynx: Oropharynx is clear.  Cardiovascular:     Rate and Rhythm: Normal rate and regular rhythm.     Heart sounds: Normal heart sounds.  Pulmonary:     Effort: Pulmonary effort is normal.     Breath sounds: Normal breath sounds.  Neurological:     Mental Status: She is alert.     Comments: Alert and oriented with normal speech expression, fluency and comprehension. Visual fields are full to confrontation.   Face is symmetric. Strength in the arms and legs is symmetric with no drift.  Sensation is normal and symmetric. No ataxia. No inattention.      ASA score: 2  Mallampati score: 4  Imaging:  See HPI  Assessment and Plan:  1.  Idiopathic intracranial hypertension 2.  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  Audible pulsations most likely coming from the stenosis 4.  Visual disturbance possibly related 5.  Headaches of mixed cause.  Clearly a migraine component.  Most likely also any idiopathic intracranial hypertension component.  Recommendation:  1.  Stenting of the right transverse/sigmoid sinus with anesthesia.  We talked about the diagnosis.  I have explained that there is a good chance that the stenosis is the  cause of her audible pulsations.  However, I have also explained that stenting does not always relieve the audible pulsations.  I estimated around 1 out of 5 patients do not experience release of their symptoms despite a successful stent procedure.  I have explained that her visual symptoms may or may not be related and may or may not improve.  I have explained that her headaches are unlikely to improve.  We also talked about the potential downside of stenting, and especially the small risk, 1% or less, of a serious complication such as stent thrombosis,  ischemic or hemorrhagic stroke which could be disabling.  She understands the above but her symptoms are significant enough that she would like to go ahead and proceed with the stent placement.  I will start her on clopidogrel  1 week before the procedure and aspirin  48 hours before the procedure and continue for 1 month afterwards.

## 2023-11-04 ENCOUNTER — Telehealth: Payer: Self-pay

## 2023-11-04 NOTE — Telephone Encounter (Signed)
 Patient called and requested her Plavix  be send to a different pharmacy.   Please send medication to [facility] at [facility].  I tried to addend the encounter but it has been signed.

## 2023-11-10 ENCOUNTER — Telehealth: Admitting: Psychiatry

## 2023-11-11 ENCOUNTER — Ambulatory Visit: Admitting: Professional Counselor

## 2023-11-11 DIAGNOSIS — F439 Reaction to severe stress, unspecified: Secondary | ICD-10-CM | POA: Diagnosis not present

## 2023-11-11 DIAGNOSIS — F331 Major depressive disorder, recurrent, moderate: Secondary | ICD-10-CM

## 2023-11-11 DIAGNOSIS — F411 Generalized anxiety disorder: Secondary | ICD-10-CM

## 2023-11-11 NOTE — Progress Notes (Signed)
 THERAPIST PROGRESS NOTE  Virtual Visit via Video Note  I connected with Olivia King on 11/11/23 at  8:00 AM EDT by a video enabled telemedicine application and verified that I am speaking with the correct person using two identifiers.  Location: Patient: Home Provider: Office   I discussed the limitations of evaluation and management by telemedicine and the availability of in person appointments. The patient expressed understanding and agreed to proceed.   I discussed the assessment and treatment plan with the patient. The patient was provided an opportunity to ask questions and all were answered. The patient agreed with the plan and demonstrated an understanding of the instructions.   The patient was advised to call back or seek an in-person evaluation if the symptoms worsen or if the condition fails to improve as anticipated.  I provided 55 minutes of non-face-to-face time during this encounter. Olivia King, Olivia King  Session Time: 8:02 AM - 8:57 AM   Participation Level: Active  Behavioral Response: Well Groomed, Alert, Anxious and Dysphoric  Type of Therapy: Individual Therapy  Treatment Goals addressed: Active OP Depression  LTG: Whenever I get into that deep depression, to be able to pull myself out in a short manner instead of letting it linger. Ways to forget thinking about the past.               Start:  07/09/23    Expected End:  07/07/24      STG: I'm gonna be honest with you, it's my kid. She's always mean and angry. To improve interpersonal effectiveness AEB increased positive interactions and decrease in negative emotional responses over the next 90 days.     STG: Maybe coping skills. To increase knowledge of coping skills AEB psychoeducation and practice of skills over the next 90 days.   ProgressTowards Goals: Progressing  Interventions: CBT and Supportive  Summary: Olivia King is a 38 y.o. female who presents with  a history of anxiety,  depression, and trauma-and-stressor-related disorder. She appeared somber but oriented x5. She reported she is going to have brain surgery for intracranial hypertension. She expressed her fears around this. Olivia King shared her impact statement, which was written as a list rather than a statement. She engaged in discussion to identify her stuck points and took notes on them. [name] actively listened to Olivia King and practiced in session. She struggled with challenging her thinking and identifying restructured thoughts. However, she expressed understanding about homework assignment.   Therapist Response: Conducted session with Olivia King. Began session with check-in/update since previous session. Utilized empathetic and reflective listening. Used open-ended questions to facilitate discussion and identified stuck points. Explained and practiced ABC King. Used Socratic question to help challenge negative thinking. Highlighted Olivia King's struggle to validate her own emotions. Assigned ABC worksheets for homework. Scheduled additional appointment and concluded session.   Suicidal/Homicidal: No  Plan: Return again in 2 weeks.  Diagnosis: GAD (generalized anxiety disorder)  Trauma and stressor-related disorder  MDD (major depressive disorder), recurrent episode, moderate (Olivia King)  Collaboration of Care: Medication Management AEB chart review  Patient/Guardian was advised Release of Information must be obtained prior to any record release in order to collaborate their care with an outside provider. Patient/Guardian was advised if they have not already done so to contact the registration department to sign all necessary forms in order for us  to release information regarding their care.   Consent: Patient/Guardian gives verbal consent for treatment and assignment of benefits for services provided during this visit. Patient/Guardian expressed understanding  and agreed to proceed.   Olivia King,  Olivia King 11/11/2023

## 2023-11-12 ENCOUNTER — Encounter: Payer: Self-pay | Admitting: Psychiatry

## 2023-11-12 ENCOUNTER — Telehealth (INDEPENDENT_AMBULATORY_CARE_PROVIDER_SITE_OTHER): Admitting: Psychiatry

## 2023-11-12 DIAGNOSIS — F411 Generalized anxiety disorder: Secondary | ICD-10-CM

## 2023-11-12 DIAGNOSIS — F419 Anxiety disorder, unspecified: Secondary | ICD-10-CM

## 2023-11-12 DIAGNOSIS — G4701 Insomnia due to medical condition: Secondary | ICD-10-CM

## 2023-11-12 DIAGNOSIS — G479 Sleep disorder, unspecified: Secondary | ICD-10-CM | POA: Diagnosis not present

## 2023-11-12 DIAGNOSIS — F3289 Other specified depressive episodes: Secondary | ICD-10-CM

## 2023-11-12 DIAGNOSIS — F3342 Major depressive disorder, recurrent, in full remission: Secondary | ICD-10-CM

## 2023-11-12 MED ORDER — VENLAFAXINE HCL 37.5 MG PO TABS
37.5000 mg | ORAL_TABLET | Freq: Every day | ORAL | 1 refills | Status: DC
Start: 2023-11-12 — End: 2023-12-16

## 2023-11-12 MED ORDER — HYDROXYZINE HCL 10 MG PO TABS: 10.0000 mg | ORAL_TABLET | Freq: Every evening | ORAL | 1 refills | Status: DC | PRN

## 2023-11-12 NOTE — Progress Notes (Signed)
 Virtual Visit via Video Note  I connected with Olivia King on 11/12/23 at 10:00 AM EDT by a video enabled telemedicine application and verified that I am speaking with the correct person using two identifiers.  Location Provider Location : [facility] Patient Location : Home  Participants: Patient , Provider    I discussed the limitations of evaluation and management by telemedicine and the availability of in person appointments. The patient expressed understanding and agreed to proceed.   I discussed the assessment and treatment plan with the patient. The patient was provided an opportunity to ask questions and all were answered. The patient agreed with the plan and demonstrated an understanding of the instructions.   The patient was advised to call back or seek an in-person evaluation if the symptoms worsen or if the condition fails to improve as anticipated.   BH MD OP Progress Note  11/12/2023 12:58 PM Malee King  MRN:  [id number]  Chief Complaint:  Chief Complaint  Patient presents with   Anxiety   Follow-up   Depression   Medication Refill   Discussed the use of AI scribe software for clinical note transcription with the patient, who gave verbal consent to proceed.  History of Present Illness Olivia King is a 38 year old female employed, lives in [place], widowed, has a history of MDD, GAD, tobacco use disorder, insomnia, intracranial hypertension was evaluated by telemedicine today.  She was terminated from her job on June 2nd following a disagreement with a coworker and subsequent issues with her supervisor. She describes the incident as a misunderstanding and has been attending therapy to address anger issues and PTSD. She feels she has made progress in managing her anger and anxiety, noting that she does not 'yell as much' and is 'proud' of her progress.  She is currently working part-time at a [employer], [employer] and has been applying for other jobs,  submitting two to three applications daily. She reports experiencing vivid dreams and disrupted sleep, and notes her recent job loss.  She has a history of intracranial hypertension and is scheduled for minor brain surgery in August to place a stent to relieve pressure. 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  She is currently taking venlafaxine  75 mg daily, with an additional 37.5 mg as needed, and lamotrigine  75 mg daily. She recently stopped taking topiramate  and reports increased vivid dreaming, which she attributes to the medication change. She also takes hydroxyzine  for sleep but reports it is not fully effective due to persistent vivid dreams.  Denies thoughts of self-harm or harm to others. She has been prescribed Ubrelvy and Ajovy  for her migraines.   Visit Diagnosis: R/O PTSD   ICD-10-CM   1. Recurrent major depressive disorder, in full remission (HCC)  F33.42     2. GAD (generalized anxiety disorder)  F41.1 venlafaxine  (EFFEXOR ) 37.5 MG tablet    hydrOXYzine  (ATARAX ) 10 MG tablet    3. Insomnia due to medical condition  G47.01    Mood symptoms, history of trauma      Past Psychiatric History: I have reviewed past psychiatric history from progress note on 10/10/2019.  Past trials of medications like Lexapro , Vyvanse , Abilify , Paxil , Zoloft, Cymbalta   Past Medical History:  Past Medical History:  Diagnosis Date   Cigarette smoker    COVID-19 virus detected 03/21/2019   Family history of pancreatic cancer 08/2020   MyRisk neg except SDHA VUS; IBIS=9.2%/riskscore=7.3%  IUD contraception 02/06/2016   Placed January 23, 2016 by [name], PA [facility]   Major depressive disorder, recurrent (HCC) 08/19/2015   zoloft made her cry; cymbalta  made her too sleepy   Migraine headache     Past Surgical History:  Procedure Laterality Date   CESAREAN SECTION  11/21/2002 &2012   contraceptive implant       Family Psychiatric History: I have reviewed family psychiatric history from progress note on 10/10/2019.  Family History:  Family History  Problem Relation Age of Onset   Hypertension Mother    Pancreatic cancer Mother 77   Hypertension Brother    Aneurysm Maternal Grandmother    Heart attack Maternal Grandfather    Hypertension Paternal Grandmother    Heart failure Sister     Social History: I have reviewed social history from progress note on 10/10/2019. Social History   Socioeconomic History   Marital status: Widowed    Spouse name: Not on file   Number of children: 2   Years of education: Not on file   Highest education level: Associate degree: occupational, Scientist, product/process development, or vocational program  Occupational History   Not on file  Tobacco Use   Smoking status: Former    Current packs/day: 0.00    Types: Cigarettes    Start date: 03/05/2011    Quit date: 06/06/2022    Years since quitting: 1.4   Smokeless tobacco: Never  Vaping Use   Vaping status: Never Used  Substance and Sexual Activity   Alcohol use: Yes    Alcohol/week: 0.0 standard drinks of alcohol   Drug use: No   Sexual activity: Yes    Partners: Male    Birth control/protection: I.U.D.    Comment: Patient has liletta  IUD  Other Topics Concern   Not on file  Social History Narrative   She is a widow, he died 11-20-09 while he was deployed.    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   Social Drivers of Health   Financial Resource Strain: Low Risk  (06/09/2023)   Overall Financial Resource Strain (CARDIA)    Difficulty of Paying Living Expenses: Not hard at all  Food Insecurity: No Food Insecurity (06/09/2023)   Hunger Vital Sign    Worried About Running Out of Food in the Last Year: Never true    Ran Out of Food in the Last Year: Never true  Transportation Needs: No Transportation Needs (06/09/2023)   PRAPARE - Administrator, Civil Service (Medical): No    Lack of Transportation  (Non-Medical): No  Physical Activity: Inactive (06/09/2023)   Exercise Vital Sign    Days of Exercise per Week: 0 days    Minutes of Exercise per Session: 0 min  Stress: Stress Concern Present (06/09/2023)   [employer] of Occupational Health - Occupational Stress Questionnaire    Feeling of Stress : To some extent  Social Connections: Moderately Isolated (06/09/2023)   Social Connection and Isolation Panel    Frequency of Communication with Friends and Family: More than three times a week    Frequency of Social Gatherings with Friends and Family: Once a week    Attends Religious Services: 1 to 4 times per year    Active Member of [employer] or Organizations: No    Attends Banker Meetings: Never    Marital Status: Widowed    Allergies:  Allergies  Allergen Reactions   Sumatriptan  Anaphylaxis    Throat and tongue swelling  Throat and tongue  swelling  Throat and tongue swelling    Penicillins Rash and Dermatitis   Vortioxetine  Other (See Comments)    Tingling hands and blurred vision  Tingling hands and blurred vision  Tingling hands and blurred vision    Doxycycline      Unable to take it currently due to intracranial hypertension    Oxybate     Metabolic Disorder Labs: Lab Results  Component Value Date   HGBA1C 5.4 12/24/2022   MPG 108 12/24/2022   MPG 97 12/15/2021   No results found for: PROLACTIN Lab Results  Component Value Date   CHOL 183 12/24/2022   TRIG 86 12/24/2022   HDL 50 12/24/2022   CHOLHDL 3.7 12/24/2022   VLDL 11 03/19/2016   LDLCALC 114 (H) 12/24/2022   LDLCALC 104 (H) 12/15/2021   Lab Results  Component Value Date   TSH 2.20 12/24/2022   TSH 2.49 12/17/2020    Therapeutic Level Labs: No results found for: LITHIUM No results found for: VALPROATE No results found for: CBMZ  Current Medications: Current Outpatient Medications  Medication Sig Dispense Refill   aspirin  EC 325 MG tablet Take 1 tablet (325 mg total) by  mouth daily. Begin 2 days prior to stent procedure. 45 tablet 0   clindamycin  (CLEOCIN  T) 1 % lotion Apply topically 2 (two) times daily. 60 mL 2   EMGALITY 120 MG/ML SOAJ 1 mL.     EPINEPHrine 0.3 mg/0.3 mL IJ SOAJ injection Inject into the muscle.     fluticasone  (FLONASE ) 50 MCG/ACT nasal spray Place 2 sprays into both nostrils daily. 15.8 mL 1   folic acid  (FOLVITE ) 1 MG tablet Take 1 mg by mouth daily.     Fremanezumab -vfrm (AJOVY ) 225 MG/1.5ML SOAJ Inject 1.5 mLs into the skin every 30 (thirty) days.     hydrOXYzine  (ATARAX ) 10 MG tablet Take 1 tablet (10 mg total) by mouth at bedtime as needed. 90 tablet 1   ipratropium (ATROVENT) 0.03 % nasal spray ipratropium bromide 21 mcg (0.03 %) nasal spray     lamoTRIgine  (LAMICTAL ) 25 MG tablet Take 3 tablets (75 mg total) by mouth daily. 270 tablet 1   loratadine  (CLARITIN ) 10 MG tablet Take 1 tablet (10 mg total) by mouth daily. 90 tablet 1   metoprolol  succinate (TOPROL -XL) 25 MG 24 hr tablet TAKE 1 TABLET(25 MG) BY MOUTH DAILY 90 tablet 0   metroNIDAZOLE  (METROGEL ) 0.75 % vaginal gel Place vaginally 2 (two) times daily. 70 g 2   montelukast  (SINGULAIR ) 10 MG tablet Take 1 tablet (10 mg total) by mouth at bedtime. 90 tablet 1   Multiple Vitamin (MULTI-VITAMINS) TABS Take by mouth.     Olopatadine  HCl 0.2 % SOLN Apply 1 drop to eye daily at 12 noon. (Patient not taking: Reported on 11/03/2023) 2.5 mL 1   torsemide  (DEMADEX ) 20 MG tablet Take 1 tablet (20 mg total) by mouth every three (3) days as needed. 30 tablet 0   traZODone  (DESYREL ) 50 MG tablet Take 0.5-1 tablets (25-50 mg total) by mouth at bedtime as needed for sleep. 30 tablet 1   Ubrogepant 100 MG TABS Take 100 mg by mouth daily.     venlafaxine  (EFFEXOR ) 37.5 MG tablet Take 1 tablet (37.5 mg total) by mouth daily with breakfast. Take along with 75 mg daily 30 tablet 1   venlafaxine  XR (EFFEXOR  XR) 75 MG 24 hr capsule Take 1 capsule (75 mg total) by mouth daily with breakfast. 90 capsule  1   vitamin B-12 (  CYANOCOBALAMIN ) 500 MCG tablet Take 500 mcg by mouth daily.     Vitamin D , Ergocalciferol , (DRISDOL ) 1.25 MG (50000 UNIT) CAPS capsule Take 50,000 Units by mouth once a week.     Current Facility-Administered Medications  Medication Dose Route Frequency Provider Last Rate Last Admin   betamethasone  acetate-betamethasone  sodium phosphate (CELESTONE ) injection 3 mg  3 mg Intra-articular Once        clopidogrel  (PLAVIX ) tablet 75 mg  75 mg Oral Daily          Musculoskeletal: Strength & Muscle Tone: UTA Gait & Station: Seated Patient leans: N/A  Psychiatric Specialty Exam: Review of Systems  Psychiatric/Behavioral:  Positive for sleep disturbance. The patient is nervous/anxious.        Anger     There were no vitals taken for this visit.There is no height or weight on file to calculate BMI.  General Appearance: Casual  Eye Contact:  Fair  Speech:  Clear and Coherent  Volume:  Normal  Mood:  Anxious and Anger problem   Affect:  Appropriate  Thought Process:  Goal Directed and Descriptions of Associations: Intact  Orientation:  Full (Time, Place, and Person)  Thought Content: Logical   Suicidal Thoughts:  No  Homicidal Thoughts:  No  Memory:  Immediate;   Fair Recent;   Fair Remote;   Fair  Judgement:  Fair  Insight:  Fair  Psychomotor Activity:  Normal  Concentration:  Concentration: Fair and Attention Span: Fair  Recall:  [employer] of Knowledge: Fair  Language: Fair  Akathisia:  No  Handed:  Right  AIMS (if indicated): not done  Assets:  Communication Skills Desire for Improvement Housing Social Support Transportation  ADL's:  Intact  Cognition: WNL  Sleep:  Poor   Screenings: AIMS    Flowsheet Row Video Visit from 10/23/2021 in [facility]  AIMS Total Score 0   GAD-7    Flowsheet Row Office Visit from 07/21/2023 in [facility] from 06/09/2023 in [facility] Office Visit from 05/05/2023 in [facility] Office Visit from 03/17/2023 in [facility] Office Visit from 03/02/2023 in [facility]  Total GAD-7 Score 0 12 5 7 4    PHQ2-9    Flowsheet Row Office Visit from 07/21/2023 in [facility] Counselor from 06/09/2023 in [facility] Office Visit from 05/05/2023 in [facility] Office Visit from 03/17/2023 in [facility] Office Visit from 03/02/2023 in [facility]  PHQ-2 Total Score 0 4 0 2 0  PHQ-9 Total Score 0 11 -- 4 6   Flowsheet Row Video Visit from 11/12/2023 in [facility] Office Visit from 08/11/2023 in [facility] Counselor from 06/09/2023 in [facility]  C-SSRS RISK CATEGORY No Risk No Risk No Risk     Assessment and Plan: Ilissa Rosner is a 38 year old female, has a history of depression, anxiety, insomnia was evaluated by telemedicine today.  Discussed assessment and plan as noted below.  Anxiety disorder-unstable Currently with anxiety, anger issues, recent job loss, situational stressors likely contributing to it.  Currently engaged in psychotherapy sessions and reports managing it better.  Recently increased the venlafaxine  back to 112.5 mg by self.  Interested in continuing this dosage. Change  Venlafaxine  extended release 112.5 mg daily Continue Lamictal  75 mg daily in divided dosage. Discontinued Topamax , was taking it for headaches and no longer on it. Advised to continue CBT with Ms. Veva  Depression-in remission Currently does have situational sadness due to recent job loss although denies any significant  anhedonia . Continue Venlafaxine  as prescribed Continue CBT.  Insomnia-unstable Currently does report worsening vivid dreams likely exacerbated by recent medication changes as well as situational stressors. Continue Trazodone  25-50 mg at bedtime as needed Continue Hydroxyzine  10 mg at bedtime as needed Encouraged to use it since she uses it only as needed. Consider medication readjustment in the future, she currently declines.  Rule out posttraumatic stress disorder-will explore this in future sessions.  I have reviewed notes per Ms. Veva dated 11/11/2023-patient currently engaged in psychotherapy.  Follow-up in clinic in 3 to 4 weeks or sooner if needed.   Collaboration of Care: Collaboration of Care: Referral or follow-up with counselor/therapist AEB encouraged to attend psychotherapy  Patient/Guardian was advised Release of Information must be obtained prior to any record release in order to collaborate their care with an outside provider. Patient/Guardian was advised if they have not already done so to contact the registration department to sign all necessary forms in order for us  to release information regarding their care.   Consent: Patient/Guardian gives verbal consent for treatment and assignment of benefits for services provided during this visit. Patient/Guardian expressed understanding and agreed to proceed.   This note was generated in part or whole with voice recognition software. Voice recognition is usually quite accurate but there are transcription errors that can and very often do occur. I apologize for any typographical errors that were not detected and corrected.    Sadler Teschner, MD 11/12/2023, 12:58 PM

## 2023-11-15 ENCOUNTER — Other Ambulatory Visit: Payer: Self-pay

## 2023-11-18 ENCOUNTER — Other Ambulatory Visit: Payer: Self-pay | Admitting: Neuroradiology

## 2023-11-18 MED ORDER — CLOPIDOGREL BISULFATE 75 MG PO TABS: 75.0000 mg | ORAL_TABLET | Freq: Every day | ORAL | 0 refills | Status: DC

## 2023-11-24 ENCOUNTER — Other Ambulatory Visit (HOSPITAL_COMMUNITY): Payer: Self-pay | Admitting: Neuroradiology

## 2023-11-24 ENCOUNTER — Encounter (HOSPITAL_COMMUNITY): Payer: Self-pay

## 2023-11-24 DIAGNOSIS — G932 Benign intracranial hypertension: Secondary | ICD-10-CM

## 2023-11-24 DIAGNOSIS — H93A1 Pulsatile tinnitus, right ear: Secondary | ICD-10-CM

## 2023-11-24 DIAGNOSIS — H538 Other visual disturbances: Secondary | ICD-10-CM

## 2023-11-27 MED ORDER — ASPIRIN 325 MG PO TBEC: 325.0000 mg | DELAYED_RELEASE_TABLET | Freq: Every day | ORAL | 0 refills | Status: DC

## 2023-11-27 NOTE — Addendum Note (Signed)
 Addended by: Delan Ksiazek on: 11/27/2023 01:10 PM   Modules accepted: Orders

## 2023-11-29 ENCOUNTER — Ambulatory Visit: Admitting: Professional Counselor

## 2023-11-29 DIAGNOSIS — F411 Generalized anxiety disorder: Secondary | ICD-10-CM

## 2023-11-29 DIAGNOSIS — F419 Anxiety disorder, unspecified: Secondary | ICD-10-CM

## 2023-11-29 DIAGNOSIS — F439 Reaction to severe stress, unspecified: Secondary | ICD-10-CM

## 2023-11-29 DIAGNOSIS — F3342 Major depressive disorder, recurrent, in full remission: Secondary | ICD-10-CM

## 2023-11-29 DIAGNOSIS — G4700 Insomnia, unspecified: Secondary | ICD-10-CM

## 2023-11-29 HISTORY — DX: Insomnia, unspecified: G47.00

## 2023-11-29 HISTORY — DX: Anxiety disorder, unspecified: F41.9

## 2023-11-29 NOTE — Progress Notes (Signed)
 THERAPIST PROGRESS NOTE  Virtual Visit via Video Note  I connected with Olivia King on 11/30/23 at  2:00 PM EDT by a video enabled telemedicine application and verified that I am speaking with the correct person using two identifiers.  Location: Patient: Home Provider: Remote office   I discussed the limitations of evaluation and management by telemedicine and the availability of in person appointments. The patient expressed understanding and agreed to proceed.   I discussed the assessment and treatment plan with the patient. The patient was provided an opportunity to ask questions and all were answered. The patient agreed with the plan and demonstrated an understanding of the instructions.   The patient was advised to call back or seek an in-person evaluation if the symptoms worsen or if the condition fails to improve as anticipated.  I provided 50 minutes of non-face-to-face time during this encounter. [name], [facility]  Session Time: 2:00 PM - 2:50 PM   Participation Level: Active  Behavioral Response: Well Groomed, Alert, Euthymic  Type of Therapy: Individual Therapy  Treatment Goals addressed: Active OP Depression  LTG: Whenever I get into that deep depression, to be able to pull myself out in a short manner instead of letting it linger. Ways to forget thinking about the past.               Start:  07/09/23    Expected End:  07/07/24      STG: I'm gonna be honest with you, it's my kid. She's always mean and angry. To improve interpersonal effectiveness AEB increased positive interactions and decrease in negative emotional responses over the next 90 days.     STG: Maybe coping skills. To increase knowledge of coping skills AEB psychoeducation and practice of skills over the next 90 days.   ProgressTowards Goals: Progressing  Interventions: CBT and Supportive  Summary: Olivia King is a 38 y.o. female who presents with a history of anxiety,  depression, and trauma-and-stressor-related disorder. She appeared alert and oriented x5. Evony stated things are going okay. She is working part-time and helped her son find an apartment. He will be moving in next month. She reported her sister is also moving out next month. Olivia King shared a few ABC worksheets she completed. She noted she can see how she avoids emotions, particularly negative emotions. She was in agreement to practice ABC worksheets daily to help her connect more to events/thoughts/feelings. She will also complete homework assignment to complete ABC worksheets in connection to traumatic event. She scored 60 on PCL screening.   Therapist Response: Conducted session with [employer]. Began session with check-in/update since previous session. Utilized empathetic and reflective listening. Used Socratic questioning to help challenge negative thinking and assisted with identifying thoughts that are driving emotions. Assigned homework to complete ABC worksheet daily to help connect to things happening and emotions surrounding those events. Also assigned homework to complete ABC worksheets in connection to trauma. Administered PCL screening. Scheduled additional appointment and concluded session.   Suicidal/Homicidal: No  Plan: Return again in 2 weeks.  Diagnosis: GAD (generalized anxiety disorder)  Trauma and stressor-related disorder  Recurrent major depressive disorder, in full remission (HCC)  Collaboration of Care: Medication Management AEB chart review  Patient/Guardian was advised Release of Information must be obtained prior to any record release in order to collaborate their care with an outside provider. Patient/Guardian was advised if they have not already done so to contact the registration department to sign all necessary forms in order for us   to release information regarding their care.   Consent: Patient/Guardian gives verbal consent for treatment and assignment of benefits  for services provided during this visit. Patient/Guardian expressed understanding and agreed to proceed.   [name], [facility] 11/30/2023

## 2023-12-10 ENCOUNTER — Other Ambulatory Visit: Payer: Self-pay

## 2023-12-10 ENCOUNTER — Encounter (HOSPITAL_COMMUNITY): Payer: Self-pay | Admitting: Neuroradiology

## 2023-12-10 DIAGNOSIS — Z01818 Encounter for other preprocedural examination: Secondary | ICD-10-CM

## 2023-12-10 NOTE — Addendum Note (Signed)
 Addended by: Jerimah Witucki on: 12/10/2023 10:15 AM   Modules accepted: Orders

## 2023-12-10 NOTE — Progress Notes (Signed)
 PCP - Dr D[facility] - Almarie Ligas, [facility] Cardiologist - none since 2023-Dr [facility]ell [facility] - Kaitlin Caffaro, [place]  Chest x-ray - n/a EKG - DOS Stress Test - n/a ECHO - 09/04/19 Cardiac Cath - n/a  ICD Pacemaker/Loop - n/a  Sleep Study -  n/a  Diabetes - n/a  Aspirin  & Blood Thinner Instructions:  Continue per MD.  NPO   Anesthesia review: no  STOP now taking any Aspirin  (unless otherwise instructed by your surgeon), Aleve, Naproxen, Ibuprofen , Motrin , Advil , Goody's, BC's, all herbal medications, fish oil, and all vitamins.   Coronavirus Screening Do you have any of the following symptoms:  Cough yes/no: No Fever (>100.42F)  yes/no: No Runny nose yes/no: No Sore throat yes/no: No Difficulty breathing/shortness of breath  yes/no: No  Have you traveled in the last 14 days and where? yes/no: No  Patient verbalized understanding of instructions that were given via phone.

## 2023-12-12 NOTE — Anesthesia Preprocedure Evaluation (Signed)
 Anesthesia Evaluation  Patient identified by MRN, date of birth, ID band Patient awake    Reviewed: Allergy & Precautions, NPO status   Airway Mallampati: III  TM Distance: >3 FB Neck ROM: Full    Dental  (+) Dental Advisory Given, Chipped,    Pulmonary former smoker   Pulmonary exam normal breath sounds clear to auscultation       Cardiovascular Normal cardiovascular exam+ dysrhythmias  Rhythm:Regular Rate:Normal  TTE 2021  1. Left ventricular ejection fraction, by estimation, is 55 to 60%. The  left ventricle has normal function. The left ventricle has no regional  wall motion abnormalities. Left ventricular diastolic parameters were  normal.   2. Right ventricular systolic function is normal. The right ventricular  size is normal. There is normal pulmonary artery systolic pressure.   3. The mitral valve is normal in structure. No evidence of mitral valve  regurgitation. No evidence of mitral stenosis.   4. The aortic valve is normal in structure. Aortic valve regurgitation is  not visualized. No aortic stenosis is present.   5. The inferior vena cava is normal in size with greater than 50%  respiratory variability, suggesting right atrial pressure of 3 mmHg.     Neuro/Psych  Headaches PSYCHIATRIC DISORDERS Anxiety Depression     Neuromuscular disease (IIH)    GI/Hepatic negative GI ROS, Neg liver ROS,,,  Endo/Other    Class 3 obesity (BMI 46)  Renal/GU negative Renal ROS  negative genitourinary   Musculoskeletal negative musculoskeletal ROS (+)    Abdominal   Peds  Hematology  (+) Blood dyscrasia (plavix )   Anesthesia Other Findings  Stenting of the right transverse/sigmoid sinus  Reproductive/Obstetrics                              Anesthesia Physical Anesthesia Plan  ASA: 3  Anesthesia Plan: General   Post-op Pain Management: Minimal or no pain anticipated   Induction:  Intravenous  PONV Risk Score and Plan: 3 and Midazolam , Dexamethasone  and Ondansetron   Airway Management Planned: Oral ETT  Additional Equipment: Arterial line  Intra-op Plan:   Post-operative Plan: Extubation in OR  Informed Consent: I have reviewed the patients History and Physical, chart, labs and discussed the procedure including the risks, benefits and alternatives for the proposed anesthesia with the patient or authorized representative who has indicated his/her understanding and acceptance.     Dental advisory given  Plan Discussed with: CRNA  Anesthesia Plan Comments:          Anesthesia Quick Evaluation

## 2023-12-13 ENCOUNTER — Ambulatory Visit (HOSPITAL_BASED_OUTPATIENT_CLINIC_OR_DEPARTMENT_OTHER): Payer: Self-pay | Admitting: Physician Assistant

## 2023-12-13 ENCOUNTER — Ambulatory Visit (HOSPITAL_COMMUNITY): Payer: Self-pay | Admitting: Physician Assistant

## 2023-12-13 ENCOUNTER — Encounter (HOSPITAL_COMMUNITY)
Admission: RE | Disposition: A | Discharge status: Home / Self Care | Payer: Self-pay | Source: Home / Self Care | Attending: Neuroradiology

## 2023-12-13 ENCOUNTER — Other Ambulatory Visit: Payer: Self-pay | Admitting: Neuroradiology

## 2023-12-13 ENCOUNTER — Ambulatory Visit (HOSPITAL_COMMUNITY)
Admission: RE | Admit: 2023-12-13 | Discharge: 2023-12-13 | Disposition: A | Discharge status: Home / Self Care | Source: Ambulatory Visit | Attending: Neuroradiology | Admitting: Neuroradiology

## 2023-12-13 ENCOUNTER — Other Ambulatory Visit: Payer: Self-pay

## 2023-12-13 ENCOUNTER — Ambulatory Visit (HOSPITAL_COMMUNITY)
Admission: RE | Admit: 2023-12-13 | Discharge: 2023-12-13 | Disposition: A | Discharge status: Home / Self Care | Attending: Neuroradiology | Admitting: Neuroradiology

## 2023-12-13 ENCOUNTER — Encounter (HOSPITAL_COMMUNITY): Payer: Self-pay | Admitting: Neuroradiology

## 2023-12-13 DIAGNOSIS — G932 Benign intracranial hypertension: Secondary | ICD-10-CM

## 2023-12-13 DIAGNOSIS — F418 Other specified anxiety disorders: Secondary | ICD-10-CM | POA: Diagnosis not present

## 2023-12-13 DIAGNOSIS — Z7902 Long term (current) use of antithrombotics/antiplatelets: Secondary | ICD-10-CM | POA: Insufficient documentation

## 2023-12-13 DIAGNOSIS — H93A1 Pulsatile tinnitus, right ear: Secondary | ICD-10-CM

## 2023-12-13 DIAGNOSIS — I871 Compression of vein: Secondary | ICD-10-CM | POA: Diagnosis not present

## 2023-12-13 DIAGNOSIS — H538 Other visual disturbances: Secondary | ICD-10-CM | POA: Insufficient documentation

## 2023-12-13 DIAGNOSIS — Z7982 Long term (current) use of aspirin: Secondary | ICD-10-CM | POA: Diagnosis not present

## 2023-12-13 DIAGNOSIS — Z6841 Body Mass Index (BMI) 40.0 and over, adult: Secondary | ICD-10-CM

## 2023-12-13 HISTORY — DX: Hyperlipidemia, unspecified: E78.5

## 2023-12-13 HISTORY — PX: IR TRANSCATH PLC STENT  INITIAL VEIN  INC ANGIOPLASTY: IMG5445

## 2023-12-13 HISTORY — DX: Cardiac arrhythmia, unspecified: I49.9

## 2023-12-13 HISTORY — PX: RADIOLOGY WITH ANESTHESIA: SHX6223

## 2023-12-13 HISTORY — PX: IR US GUIDE VASC ACCESS RIGHT: IMG2390

## 2023-12-13 HISTORY — DX: Pneumonia, unspecified organism: J18.9

## 2023-12-13 LAB — BASIC METABOLIC PANEL WITH GFR
Anion gap: 11 (ref 5–15)
BUN: 11 mg/dL (ref 6–20)
CO2: 22 mmol/L (ref 22–32)
Calcium: 9.3 mg/dL (ref 8.9–10.3)
Chloride: 101 mmol/L (ref 98–111)
Creatinine, Ser: 1.03 mg/dL — ABNORMAL HIGH (ref 0.44–1.00)
GFR, Estimated: >60 mL/min
Glucose, Bld: 103 mg/dL — ABNORMAL HIGH (ref 70–99)
Potassium: 4.1 mmol/L (ref 3.5–5.1)
Sodium: 134 mmol/L — ABNORMAL LOW (ref 135–145)

## 2023-12-13 LAB — CBC
HCT: 40.2 % (ref 36.0–46.0)
Hemoglobin: 13.5 g/dL (ref 12.0–15.0)
MCH: 29.7 pg (ref 26.0–34.0)
MCHC: 33.6 g/dL (ref 30.0–36.0)
MCV: 88.5 fL (ref 80.0–100.0)
Platelets: 351 K/uL (ref 150–400)
RBC: 4.54 MIL/uL (ref 3.87–5.11)
RDW: 12.7 % (ref 11.5–15.5)
WBC: 10.4 K/uL (ref 4.0–10.5)
nRBC: 0 % (ref 0.0–0.2)

## 2023-12-13 LAB — POCT PREGNANCY, URINE: Preg Test, Ur: NEGATIVE

## 2023-12-13 SURGERY — RADIOLOGY WITH ANESTHESIA
Anesthesia: General

## 2023-12-13 MED ORDER — MIDAZOLAM HCL 2 MG/2ML IJ SOLN
INTRAMUSCULAR | Status: DC | PRN
  Administered 2023-12-13: 2 mg via INTRAVENOUS

## 2023-12-13 MED ORDER — LACTATED RINGERS IV SOLN: INTRAVENOUS | Status: DC

## 2023-12-13 MED ORDER — PHENYLEPHRINE 80 MCG/ML (10ML) SYRINGE FOR IV PUSH (FOR BLOOD PRESSURE SUPPORT)
PREFILLED_SYRINGE | INTRAVENOUS | Status: DC | PRN
  Administered 2023-12-13: 40 ug via INTRAVENOUS

## 2023-12-13 MED ORDER — ROCURONIUM BROMIDE 10 MG/ML (PF) SYRINGE
PREFILLED_SYRINGE | INTRAVENOUS | Status: DC | PRN
  Administered 2023-12-13: 5 mg via INTRAVENOUS
  Administered 2023-12-13: 50 mg via INTRAVENOUS

## 2023-12-13 MED ORDER — SUGAMMADEX SODIUM 200 MG/2ML IV SOLN
INTRAVENOUS | Status: DC | PRN
  Administered 2023-12-13: 200 mg via INTRAVENOUS

## 2023-12-13 MED ORDER — PROPOFOL 10 MG/ML IV BOLUS
INTRAVENOUS | Status: DC | PRN
  Administered 2023-12-13: 150 mg via INTRAVENOUS

## 2023-12-13 MED ORDER — KETOROLAC TROMETHAMINE 10 MG PO TABS: 10.0000 mg | ORAL_TABLET | Freq: Four times a day (QID) | ORAL | 0 refills | Status: DC | PRN

## 2023-12-13 MED ORDER — FENTANYL CITRATE (PF) 250 MCG/5ML IJ SOLN
INTRAMUSCULAR | Status: DC | PRN
  Administered 2023-12-13: 25 ug via INTRAVENOUS
  Administered 2023-12-13: 75 ug via INTRAVENOUS

## 2023-12-13 MED ORDER — FENTANYL CITRATE (PF) 100 MCG/2ML IJ SOLN
25.0000 ug | INTRAMUSCULAR | Status: DC | PRN
  Administered 2023-12-13: 25 ug via INTRAVENOUS

## 2023-12-13 MED ORDER — AMISULPRIDE (ANTIEMETIC) 5 MG/2ML IV SOLN
10.0000 mg | Freq: Once | INTRAVENOUS | Status: AC | PRN
  Administered 2023-12-13: 10 mg via INTRAVENOUS

## 2023-12-13 MED ORDER — FENTANYL CITRATE (PF) 100 MCG/2ML IJ SOLN
INTRAMUSCULAR | Status: AC
  Filled 2023-12-13: qty 2

## 2023-12-13 MED ORDER — AMISULPRIDE (ANTIEMETIC) 5 MG/2ML IV SOLN
INTRAVENOUS | Status: AC
  Filled 2023-12-13: qty 4

## 2023-12-13 MED ORDER — DEXAMETHASONE SODIUM PHOSPHATE 10 MG/ML IJ SOLN
INTRAMUSCULAR | Status: DC | PRN
  Administered 2023-12-13: 10 mg via INTRAVENOUS

## 2023-12-13 MED ORDER — ONDANSETRON HCL 4 MG PO TABS: 4.0000 mg | ORAL_TABLET | Freq: Three times a day (TID) | ORAL | 0 refills | Status: DC | PRN

## 2023-12-13 MED ORDER — ESMOLOL HCL 100 MG/10ML IV SOLN
INTRAVENOUS | Status: DC | PRN
  Administered 2023-12-13: 30 mg via INTRAVENOUS
  Administered 2023-12-13: 20 mg via INTRAVENOUS

## 2023-12-13 MED ORDER — OXYCODONE HCL 5 MG PO TABS: 5.0000 mg | ORAL_TABLET | Freq: Once | ORAL | Status: DC | PRN

## 2023-12-13 MED ORDER — LIDOCAINE 2% (20 MG/ML) 5 ML SYRINGE
INTRAMUSCULAR | Status: DC | PRN
  Administered 2023-12-13: 80 mg via INTRAVENOUS

## 2023-12-13 MED ORDER — OXYCODONE HCL 5 MG/5ML PO SOLN: 5.0000 mg | Freq: Once | ORAL | Status: DC | PRN

## 2023-12-13 MED ORDER — MIDAZOLAM HCL 2 MG/2ML IJ SOLN
INTRAMUSCULAR | Status: AC
  Filled 2023-12-13: qty 2

## 2023-12-13 MED ORDER — ORAL CARE MOUTH RINSE: 15.0000 mL | Freq: Once | OROMUCOSAL | Status: AC

## 2023-12-13 MED ORDER — FENTANYL CITRATE (PF) 100 MCG/2ML IJ SOLN
INTRAMUSCULAR | Status: AC
Start: 2023-12-13 — End: 2023-12-13
  Filled 2023-12-13: qty 4

## 2023-12-13 MED ORDER — CHLORHEXIDINE GLUCONATE 0.12 % MT SOLN
15.0000 mL | Freq: Once | OROMUCOSAL | Status: AC
  Administered 2023-12-13: 15 mL via OROMUCOSAL
  Filled 2023-12-13: qty 15

## 2023-12-13 MED ORDER — ONDANSETRON HCL 4 MG/2ML IJ SOLN
INTRAMUSCULAR | Status: DC | PRN
  Administered 2023-12-13: 4 mg via INTRAVENOUS

## 2023-12-13 NOTE — Discharge Summary (Signed)
 VSS She has a headache which is typical of her headaches.   Nausea is better.  Her audible pulsations are not present.   She expresses desire to go home.   I am giving her Toradol  and Zofran  prn. Follow up in one month.

## 2023-12-13 NOTE — Anesthesia Procedure Notes (Signed)
 Arterial Line Insertion Start/End8/25/2025 7:20 AM, 12/13/2023 7:23 AM Performed by: Niels Marien CROME, MD, Roslynn Waddell LABOR, CRNA, CRNA  Patient location: Pre-op. Preanesthetic checklist: patient identified, IV checked, site marked, risks and benefits discussed, surgical consent, monitors and equipment checked, pre-op evaluation, timeout performed and anesthesia consent Lidocaine  1% used for infiltration Right, radial was placed Catheter size: 20 G Hand hygiene performed  and Seldinger technique used  Attempts: 1 Procedure performed using ultrasound guided technique. Ultrasound Notes:anatomy identified, needle tip was noted to be adjacent to the nerve/plexus identified and no ultrasound evidence of intravascular and/or intraneural injection Following insertion, dressing applied and Biopatch. Post procedure assessment: normal  Patient tolerated the procedure well with no immediate complications.

## 2023-12-13 NOTE — Anesthesia Procedure Notes (Addendum)
 Procedure Name: Intubation Date/Time: 12/13/2023 8:48 AM  Performed by: Roslynn Waddell LABOR, CRNAPre-anesthesia Checklist: Patient identified, Emergency Drugs available, Suction available and Patient being monitored Patient Re-evaluated:Patient Re-evaluated prior to induction Oxygen Delivery Method: Circle System Utilized Preoxygenation: Pre-oxygenation with 100% oxygen Induction Type: IV induction Ventilation: Mask ventilation without difficulty Laryngoscope Size: Glidescope and 3 Grade View: Grade I Tube type: Oral Number of attempts: 1 Airway Equipment and Method: Stylet and Oral airway Placement Confirmation: ETT inserted through vocal cords under direct vision, positive ETCO2 and breath sounds checked- equal and bilateral Secured at: 21 cm Tube secured with: Tape Dental Injury: Teeth and Oropharynx as per pre-operative assessment  Comments: Patient with dry lips in preop. Some cracking post intubation around corners of mouth. Will lubricate well prior to extubation. Dentition as per preop

## 2023-12-13 NOTE — Progress Notes (Signed)
 Wasted 75mcg of fentanyl  with Waddell RN in stericycle.

## 2023-12-13 NOTE — Transfer of Care (Signed)
 Immediate Anesthesia Transfer of Care Note  Patient: Olivia King  Procedure(s) Performed: RADIOLOGY WITH ANESTHESIA  Patient Location: PACU  Anesthesia Type:General  Level of Consciousness: awake, alert , and oriented  Airway & Oxygen Therapy: Patient Spontanous Breathing and Patient connected to nasal cannula oxygen  Post-op Assessment: Report given to RN and Post -op Vital signs reviewed and stable  Post vital signs: Reviewed and stable  Last Vitals:  Vitals Value Taken Time  BP 134/82 12/13/23 09:53  Temp 36.7 C 12/13/23 09:53  Pulse 87 12/13/23 09:58  Resp 23 12/13/23 09:58  SpO2 91 % 12/13/23 09:58  Vitals shown include unfiled device data.  Last Pain:  Vitals:   12/13/23 0653  TempSrc:   PainSc: 0-No pain         Complications: No notable events documented.

## 2023-12-13 NOTE — Brief Op Note (Signed)
  NEUROSURGERY BRIEF OPERATIVE  NOTE   PREOP DX: Right transverse sinus stenosis  POSTOP DX: Same  PROCEDURE: Right transverse sinus stent  SURGEON: Nancyann LULLA Burns   ANESTHESIA: GETA  EBL: Minimal  SPECIMENS: None  COMPLICATIONS: None  CONDITION: Stable to recovery  FINDINGS (Full report in [facility]): 1. Right transverse sinus stenosis, stent   Nancyann LULLA Burns  @today @ 9:32 AM

## 2023-12-13 NOTE — Progress Notes (Signed)
 I have reviewed and confirmed my history and physical from 11/03/23 with no additions or changes. Plan for Right transverse sinus stent.  Risks and benefits reviewed.

## 2023-12-13 NOTE — Sedation Documentation (Signed)
 Pt under care of anesthesia. See CRNA charting.

## 2023-12-14 ENCOUNTER — Telehealth: Payer: Self-pay

## 2023-12-14 ENCOUNTER — Encounter (HOSPITAL_COMMUNITY): Payer: Self-pay | Admitting: Neuroradiology

## 2023-12-14 ENCOUNTER — Telehealth (INDEPENDENT_AMBULATORY_CARE_PROVIDER_SITE_OTHER): Payer: Self-pay | Admitting: Neuroradiology

## 2023-12-14 DIAGNOSIS — Z4889 Encounter for other specified surgical aftercare: Secondary | ICD-10-CM

## 2023-12-14 NOTE — Anesthesia Postprocedure Evaluation (Signed)
 Anesthesia Post Note  Patient: Olivia King  Procedure(s) Performed: RADIOLOGY WITH ANESTHESIA     Patient location during evaluation: PACU Anesthesia Type: General Level of consciousness: awake and alert Pain management: pain level controlled Vital Signs Assessment: post-procedure vital signs reviewed and stable Respiratory status: spontaneous breathing, nonlabored ventilation, respiratory function stable and patient connected to nasal cannula oxygen Cardiovascular status: blood pressure returned to baseline and stable Postop Assessment: no apparent nausea or vomiting Anesthetic complications: no   No notable events documented.  Last Vitals:  Vitals:   12/13/23 1430 12/13/23 1445  BP: (!) 120/95 113/83  Pulse: 87 73  Resp: 18 (!) 24  Temp:  37 C  SpO2: 96% 95%    Last Pain:  Vitals:   12/13/23 1345  TempSrc:   PainSc: Asleep                 Caeley Dohrmann L Adrieanna Boteler

## 2023-12-14 NOTE — Telephone Encounter (Signed)
 Patient asking how long she should avoid washing her incision and if there was any specific wound care considerations.   I told her I would discuss with Dr. Lester and let her know.

## 2023-12-14 NOTE — Telephone Encounter (Signed)
 She has a minor headache.  Feels like her vision is more clear.  Audible pulsations have resolved.

## 2023-12-16 ENCOUNTER — Encounter: Payer: Self-pay | Admitting: Psychiatry

## 2023-12-16 ENCOUNTER — Telehealth (INDEPENDENT_AMBULATORY_CARE_PROVIDER_SITE_OTHER): Admitting: Psychiatry

## 2023-12-16 DIAGNOSIS — F3342 Major depressive disorder, recurrent, in full remission: Secondary | ICD-10-CM | POA: Diagnosis not present

## 2023-12-16 DIAGNOSIS — F411 Generalized anxiety disorder: Secondary | ICD-10-CM | POA: Diagnosis not present

## 2023-12-16 DIAGNOSIS — G4701 Insomnia due to medical condition: Secondary | ICD-10-CM | POA: Diagnosis not present

## 2023-12-16 MED ORDER — VENLAFAXINE HCL ER 37.5 MG PO CP24: 37.5000 mg | ORAL_CAPSULE | Freq: Every day | ORAL | 0 refills | Status: DC

## 2023-12-16 NOTE — Progress Notes (Signed)
 Virtual Visit via Video Note  I connected with Olivia King on 12/16/23 at  1:30 PM EDT by a video enabled telemedicine application and verified that I am speaking with the correct person using two identifiers.  Location Provider Location : [facility] Patient Location : Home  Participants: Patient , Provider    I discussed the limitations of evaluation and management by telemedicine and the availability of in person appointments. The patient expressed understanding and agreed to proceed.   I discussed the assessment and treatment plan with the patient. The patient was provided an opportunity to ask questions and all were answered. The patient agreed with the plan and demonstrated an understanding of the instructions.   The patient was advised to call back or seek an in-person evaluation if the symptoms worsen or if the condition fails to improve as anticipated.   BH MD OP Progress Note  12/17/2023 8:08 AM Olivia King  MRN:  [id number]  Chief Complaint:  Chief Complaint  Patient presents with   Follow-up   Depression   Anxiety   Medication Refill   Discussed the use of AI scribe software for clinical note transcription with the patient, who gave verbal consent to proceed.  History of Present Illness Olivia King is a 38 year old female, employed, lives in [place], widowed, has a history of MDD, GAD, tobacco use disorder, insomnia, intracranial hypertension status post right transverse sinus stent, was evaluated by telemedicine today for a follow-up appointment.    She reports that her mood, anger, and anxiety are better managed. She denies current symptoms of depression and states that she continues to work with her therapist, Ms.Gainey.  She continues to experience ongoing sleep difficulties, and she describes an irregular sleep pattern, particularly after a recent medical procedure. She takes hydroxyzine  nightly, rather than as needed, to help calm her mind and  facilitate sleep. Without hydroxyzine , she experiences racing thoughts and more frequent dreams. Last week, she estimates she obtained about 7 hours of sleep per night, but since her procedure, her sleep has become more fragmented as she wakes up to assist her daughter in the morning and her son as he commutes to college. Depending on how she feels, she sometimes returns to sleep after these activities.  She currently takes venlafaxine  37.5 mg and 75 mg, both at the same time. She expresses satisfaction with her current dosage and does not request any changes.  She currently lives at home. She previously worked part time at a [employer], [employer]. 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 She has a daughter in school and a son who is a Holiday representative in college and currently living at home while commuting to [employer] for classes. She receives support from her father and stepmother.   She denies any suicidality, homicidality or perceptual disturbances.    Visit Diagnosis: R/O PTSD   ICD-10-CM   1. MDD (major depressive disorder), recurrent, in full remission (HCC)  F33.42 venlafaxine  XR (EFFEXOR -XR) 37.5 MG 24 hr capsule    2. GAD (generalized anxiety disorder)  F41.1 venlafaxine  XR (EFFEXOR -XR) 37.5 MG 24 hr capsule    3. Insomnia due to medical condition  G47.01    Mood symptoms, history of trauma      Past Psychiatric History: I have reviewed past psychiatric history from progress note on 10/10/2019.  Past trials of medications like Lexapro , Vyvanse , Abilify , Paxil , Zoloft, Cymbalta .  Past Medical  History:  Past Medical History:  Diagnosis Date   Anxiety 11/29/2023   Cigarette smoker    COVID-19 virus detected 03/21/2019   Dyslipidemia    Dysrhythmia    hx tachycardia-on metoprolol    Edema of both lower extremities 07/2023   Family history of pancreatic cancer 08/2020   MyRisk neg  except SDHA VUS; IBIS=9.2%/riskscore=7.3%   Hidradenitis suppurativa 10/30/2020   Idiopathic intracranial hypertension 03/17/2023   Insomnia 11/29/2023   IUD contraception 02/06/2016   Placed January 23, 2016 by [name], PA [facility]   Major depressive disorder, recurrent (HCC) 08/19/2015   zoloft made her cry; cymbalta  made her too sleepy   Migraine headache    Pneumonia    x 1   Seasonal allergies 07/21/2023    Past Surgical History:  Procedure Laterality Date   CESAREAN SECTION  15-Jan-2003 &2012   contraceptive implant     loletta IUD   RADIOLOGY WITH ANESTHESIA N/A 12/13/2023   Procedure: RADIOLOGY WITH ANESTHESIA;  Surgeon: Lester Golas, MD;  Location: [facility] OR;  Service: Radiology;  Laterality: N/A;  Right transverse sigmoid sinus stenting   WISDOM TOOTH EXTRACTION      Family Psychiatric History: I have reviewed family psychiatric history from progress note on 10/10/2019.  Family History:  Family History  Problem Relation Age of Onset   Hypertension Mother    Pancreatic cancer Mother 47   Hypertension Brother    Aneurysm Maternal Grandmother    Heart attack Maternal Grandfather    Hypertension Paternal Grandmother    Heart failure Sister     Social History: I have reviewed social history from progress note on 10/10/2019. Social History   Socioeconomic History   Marital status: Widowed    Spouse name: Not on file   Number of children: 2   Years of education: Not on file   Highest education level: Associate degree: occupational, Scientist, product/process development, or vocational program  Occupational History   Not on file  Tobacco Use   Smoking status: Former    Current packs/day: 0.00    Types: Cigarettes    Start date: 03/05/2011    Quit date: 06/06/2022    Years since quitting: 1.5   Smokeless tobacco: Never  Vaping Use   Vaping status: Every Day   Substances: Nicotine, Flavoring  Substance and Sexual Activity   Alcohol use: Not Currently   Drug use: No   Sexual activity:  Not Currently    Partners: Male    Birth control/protection: I.U.D.    Comment: Patient has liletta  IUD  Other Topics Concern   Not on file  Social History Narrative   She is a widow, he died 2010-01-14 while he was deployed.    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   Social Drivers of Health   Financial Resource Strain: Low Risk  (06/09/2023)   Overall Financial Resource Strain (CARDIA)    Difficulty of Paying Living Expenses: Not hard at all  Food Insecurity: No Food Insecurity (06/09/2023)   Hunger Vital Sign    Worried About Running Out of Food in the Last Year: Never true    Ran Out of Food in the Last Year: Never true  Transportation Needs: No Transportation Needs (06/09/2023)   PRAPARE - Administrator, Civil Service (Medical): No    Lack of Transportation (Non-Medical): No  Physical Activity: Inactive (06/09/2023)   Exercise Vital Sign    Days of Exercise per Week: 0 days    Minutes of Exercise per Session:  0 min  Stress: Stress Concern Present (06/09/2023)   [employer] of Occupational Health - Occupational Stress Questionnaire    Feeling of Stress : To some extent  Social Connections: Moderately Isolated (06/09/2023)   Social Connection and Isolation Panel    Frequency of Communication with Friends and Family: More than three times a week    Frequency of Social Gatherings with Friends and Family: Once a week    Attends Religious Services: 1 to 4 times per year    Active Member of [employer] or Organizations: No    Attends Banker Meetings: Never    Marital Status: Widowed    Allergies:  Allergies  Allergen Reactions   Sumatriptan  Anaphylaxis    Throat and tongue swelling    Penicillins Rash and Dermatitis   Vortioxetine  Other (See Comments)    Tingling hands and blurred vision  Trintellix    Doxycycline      Unable to take it currently due to intracranial hypertension     Metabolic Disorder Labs: Lab Results  Component Value Date   HGBA1C  5.4 12/24/2022   MPG 108 12/24/2022   MPG 97 12/15/2021   No results found for: PROLACTIN Lab Results  Component Value Date   CHOL 183 12/24/2022   TRIG 86 12/24/2022   HDL 50 12/24/2022   CHOLHDL 3.7 12/24/2022   VLDL 11 03/19/2016   LDLCALC 114 (H) 12/24/2022   LDLCALC 104 (H) 12/15/2021   Lab Results  Component Value Date   TSH 2.20 12/24/2022   TSH 2.49 12/17/2020    Therapeutic Level Labs: No results found for: LITHIUM No results found for: VALPROATE No results found for: CBMZ  Current Medications: Current Outpatient Medications  Medication Sig Dispense Refill   ketorolac  (TORADOL ) 10 MG tablet Take 1 tablet (10 mg total) by mouth every 6 (six) hours as needed for severe pain (pain score 7-10). 20 tablet 0   ondansetron  (ZOFRAN ) 4 MG tablet Take 1 tablet (4 mg total) by mouth every 8 (eight) hours as needed for nausea or vomiting. 10 tablet 0   venlafaxine  XR (EFFEXOR -XR) 37.5 MG 24 hr capsule Take 1 capsule (37.5 mg total) by mouth daily with breakfast. Take along with 75 mg daily 90 capsule 0   aspirin  EC 325 MG tablet Take 1 tablet (325 mg total) by mouth daily. Begin 2 days prior to stent procedure. 45 tablet 0   aspirin -acetaminophen -caffeine  (EXCEDRIN MIGRAINE) 250-250-65 MG tablet Take 2 tablets by mouth every 6 (six) hours as needed for headache or migraine.     clindamycin  (CLEOCIN  T) 1 % lotion Apply topically 2 (two) times daily. (Patient taking differently: Apply 1 Application topically daily.) 60 mL 2   clopidogrel  (PLAVIX ) 75 MG tablet Take 1 tablet (75 mg total) by mouth daily. 90 tablet 0   EPINEPHrine 0.3 mg/0.3 mL IJ SOAJ injection Inject into the muscle.     fluticasone  (FLONASE ) 50 MCG/ACT nasal spray Place 2 sprays into both nostrils daily. (Patient not taking: Reported on 12/08/2023) 15.8 mL 1   folic acid  (FOLVITE ) 1 MG tablet Take 1 mg by mouth daily.     Fremanezumab -vfrm (AJOVY ) 225 MG/1.5ML SOAJ Inject 1.5 mLs into the skin every 30  (thirty) days. (Patient not taking: Reported on 12/08/2023)     hydrOXYzine  (ATARAX ) 10 MG tablet Take 1 tablet (10 mg total) by mouth at bedtime as needed. (Patient taking differently: Take 10 mg by mouth at bedtime.) 90 tablet 1   lamoTRIgine  (LAMICTAL ) 25 MG tablet  Take 3 tablets (75 mg total) by mouth daily. 270 tablet 1   loratadine  (CLARITIN ) 10 MG tablet Take 1 tablet (10 mg total) by mouth daily. (Patient not taking: Reported on 12/08/2023) 90 tablet 1   metoprolol  succinate (TOPROL -XL) 25 MG 24 hr tablet TAKE 1 TABLET(25 MG) BY MOUTH DAILY 90 tablet 0   metroNIDAZOLE  (METROGEL ) 0.75 % vaginal gel Place vaginally 2 (two) times daily. 70 g 2   montelukast  (SINGULAIR ) 10 MG tablet Take 1 tablet (10 mg total) by mouth at bedtime. 90 tablet 1   Multiple Vitamin (MULTI-VITAMINS) TABS Take by mouth.     torsemide  (DEMADEX ) 20 MG tablet Take 1 tablet (20 mg total) by mouth every three (3) days as needed. 30 tablet 0   venlafaxine  XR (EFFEXOR  XR) 75 MG 24 hr capsule Take 1 capsule (75 mg total) by mouth daily with breakfast. (Patient taking differently: Take 75 mg by mouth See admin instructions.) 90 capsule 1   vitamin B-12 (CYANOCOBALAMIN ) 500 MCG tablet Take 500 mcg by mouth daily.     No current facility-administered medications for this visit.     Musculoskeletal: Strength & Muscle Tone: UTA Gait & Station: Seated Patient leans: N/A  Psychiatric Specialty Exam: Review of Systems  Psychiatric/Behavioral:  Positive for sleep disturbance. The patient is nervous/anxious.     There were no vitals taken for this visit.There is no height or weight on file to calculate BMI.  General Appearance: Casual  Eye Contact:  Fair  Speech:  Normal Rate  Volume:  Normal  Mood:  Anxious coping better  Affect:  Appropriate  Thought Process:  Goal Directed and Descriptions of Associations: Intact  Orientation:  Full (Time, Place, and Person)  Thought Content: Logical   Suicidal Thoughts:  No   Homicidal Thoughts:  No  Memory:  Immediate;   Fair Recent;   Fair Remote;   Fair  Judgement:  Fair  Insight:  Fair  Psychomotor Activity:  Normal  Concentration:  Concentration: Fair and Attention Span: Fair  Recall:  [employer] of Knowledge: Fair  Language: Fair  Akathisia:  No  Handed:  Right  AIMS (if indicated): not done  Assets:  Manufacturing systems engineer Desire for Improvement Housing Social Support Transportation  ADL's:  Intact  Cognition: WNL  Sleep:  varies   Screenings: AIMS    Flowsheet Row Video Visit from 10/23/2021 in [facility]  AIMS Total Score 0   GAD-7    Flowsheet Row Office Visit from 07/21/2023 in [facility]or from 06[facility]09/2023 in [facility] Office Visit from 05/05/2023 in [facility] Office Visit from 03/17/2023 in [facility] Office Visit from 03/02/2023 in [facility]  Total GAD-7 Score 0 12 5 7 4    PHQ2-9    Flowsheet Row Office Visit from 07/21/2023 in [facility] Counselor from 06/09/2023 in [facility] Office Visit from 05/05/2023 in [facility] Office Visit from 03/17/2023 in [facility] Office Visit from 03/02/2023 in [facility]  PHQ-2 Total Score 0 4 0 2 0  PHQ-9 Total Score 0 11 -- 4 6   Flowsheet Row Video Visit from 12/16/2023 in [facility] Admission (Discharged) from 12/13/2023 in [facility] Video Visit from 11/12/2023 in [facility]  C-SSRS RISK CATEGORY No Risk No Risk No Risk     Assessment and Plan: Olivia King is a 38 year old female who has a  history of depression, anxiety, insomnia was evaluated by telemedicine today.  Discussed assessment and plan as noted below.  Generalized anxiety disorder-improving Currently reports overall anxiety has better managed on the current medication regimen and psychotherapy. Continue Venlafaxine  extended release 112.5 mg daily Continue Lamictal  75 mg daily in divided dosage Continue CBT with Ms. Veva  Depression in remission Currently denies any significant depression symptoms Continue Venlafaxine  as prescribed Continue CBT  Insomnia-improving Recently had surgical procedure which disrupted her sleep schedule.  However overall sleep is currently improving on the current medication regimen. Continue Hydroxyzine  10 mg at bedtime as needed Continue Trazodone  25-50 mg at bedtime as needed  Follow-up Follow-up in clinic in 2 months or sooner if needed.  Collaboration of Care: Collaboration of Care: Referral or follow-up with counselor/therapist AEB .  Encouraged to continue psychotherapy sessions.  Patient/Guardian was advised Release of Information must be obtained prior to any record release in order to collaborate their care with an outside provider. Patient/Guardian was advised if they have not already done so to contact the registration department to sign all necessary forms in order for us  to release information regarding their care.   Consent: Patient/Guardian gives verbal consent for treatment and assignment of benefits for services provided during this visit. Patient/Guardian expressed understanding and agreed to proceed.   This note was generated in part or whole with voice recognition software. Voice recognition is usually quite accurate but there are transcription errors that can and very often do occur. I apologize for any typographical errors that were not detected and corrected.    Ayriel Texidor, MD 12/17/2023, 8:08 AM

## 2023-12-21 ENCOUNTER — Encounter (HOSPITAL_COMMUNITY): Payer: Self-pay

## 2023-12-21 ENCOUNTER — Other Ambulatory Visit (HOSPITAL_COMMUNITY): Payer: Self-pay | Admitting: Neuroradiology

## 2023-12-21 DIAGNOSIS — G932 Benign intracranial hypertension: Secondary | ICD-10-CM

## 2023-12-21 DIAGNOSIS — H93A1 Pulsatile tinnitus, right ear: Secondary | ICD-10-CM

## 2023-12-21 DIAGNOSIS — H538 Other visual disturbances: Secondary | ICD-10-CM

## 2023-12-21 HISTORY — PX: IR VENO/JUGULAR LEFT: IMG2274

## 2023-12-21 HISTORY — PX: IR VENO/JUGULAR RIGHT: IMG2275

## 2023-12-22 NOTE — Telephone Encounter (Signed)
 Patient states that Dr. Lester called her and confirmed this information last week.

## 2023-12-24 ENCOUNTER — Ambulatory Visit (INDEPENDENT_AMBULATORY_CARE_PROVIDER_SITE_OTHER): Admitting: Professional Counselor

## 2023-12-24 DIAGNOSIS — F411 Generalized anxiety disorder: Secondary | ICD-10-CM

## 2023-12-24 DIAGNOSIS — F439 Reaction to severe stress, unspecified: Secondary | ICD-10-CM | POA: Diagnosis not present

## 2023-12-24 DIAGNOSIS — F330 Major depressive disorder, recurrent, mild: Secondary | ICD-10-CM | POA: Diagnosis not present
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
## 2023-12-28 NOTE — Patient Instructions (Signed)

## 2023-12-29 ENCOUNTER — Ambulatory Visit: Admitting: Family Medicine

## 2023-12-29 ENCOUNTER — Encounter: Payer: Self-pay | Admitting: Family Medicine

## 2023-12-29 ENCOUNTER — Other Ambulatory Visit (HOSPITAL_COMMUNITY)
Admission: RE | Admit: 2023-12-29 | Discharge: 2023-12-29 | Disposition: A | Discharge status: Home / Self Care | Source: Ambulatory Visit | Attending: Family Medicine | Admitting: Family Medicine

## 2023-12-29 VITALS — BP 122/80 | HR 103 | Temp 98.8°F | Resp 16 | Ht 61.5 in | Wt 264.6 lb

## 2023-12-29 DIAGNOSIS — Z0001 Encounter for general adult medical examination with abnormal findings: Secondary | ICD-10-CM | POA: Diagnosis not present

## 2023-12-29 DIAGNOSIS — E538 Deficiency of other specified B group vitamins: Secondary | ICD-10-CM

## 2023-12-29 DIAGNOSIS — R000 Tachycardia, unspecified: Secondary | ICD-10-CM

## 2023-12-29 DIAGNOSIS — E785 Hyperlipidemia, unspecified: Secondary | ICD-10-CM | POA: Diagnosis not present

## 2023-12-29 DIAGNOSIS — E559 Vitamin D deficiency, unspecified: Secondary | ICD-10-CM | POA: Diagnosis not present

## 2023-12-29 DIAGNOSIS — Z124 Encounter for screening for malignant neoplasm of cervix: Secondary | ICD-10-CM

## 2023-12-29 DIAGNOSIS — G932 Benign intracranial hypertension: Secondary | ICD-10-CM

## 2023-12-29 DIAGNOSIS — B349 Viral infection, unspecified: Secondary | ICD-10-CM

## 2023-12-29 DIAGNOSIS — J301 Allergic rhinitis due to pollen: Secondary | ICD-10-CM

## 2023-12-29 DIAGNOSIS — D649 Anemia, unspecified: Secondary | ICD-10-CM

## 2023-12-29 DIAGNOSIS — Z0000 Encounter for general adult medical examination without abnormal findings: Secondary | ICD-10-CM | POA: Diagnosis present

## 2023-12-29 DIAGNOSIS — Z79899 Other long term (current) drug therapy: Secondary | ICD-10-CM

## 2023-12-29 DIAGNOSIS — R600 Localized edema: Secondary | ICD-10-CM

## 2023-12-29 DIAGNOSIS — J069 Acute upper respiratory infection, unspecified: Secondary | ICD-10-CM | POA: Diagnosis not present

## 2023-12-29 DIAGNOSIS — E88819 Insulin resistance, unspecified: Secondary | ICD-10-CM

## 2023-12-29 LAB — POC COVID19/FLU A&B COMBO
Covid Antigen, POC: NEGATIVE
Influenza A Antigen, POC: NEGATIVE
Influenza B Antigen, POC: NEGATIVE

## 2023-12-29 MED ORDER — TORSEMIDE 20 MG PO TABS: 20.0000 mg | ORAL_TABLET | ORAL | 0 refills | Status: DC | PRN

## 2023-12-29 MED ORDER — MONTELUKAST SODIUM 10 MG PO TABS: 10.0000 mg | ORAL_TABLET | Freq: Every day | ORAL | 1 refills | Status: AC

## 2023-12-29 MED ORDER — CLINDAMYCIN PHOSPHATE 1 % EX LOTN
1.0000 | TOPICAL_LOTION | Freq: Every day | CUTANEOUS | 5 refills | Status: AC
Start: 2023-12-29 — End: ?

## 2023-12-29 MED ORDER — METOPROLOL SUCCINATE ER 25 MG PO TB24
25.0000 mg | ORAL_TABLET | Freq: Every day | ORAL | 1 refills | Status: AC
Start: 2023-12-29 — End: ?

## 2023-12-29 NOTE — Progress Notes (Signed)
 Name: Olivia King   MRN: [id number]    DOB: [birth year]   Date:12/29/2023       Progress Note  Subjective  Chief Complaint  Chief Complaint  Patient presents with   Annual Exam    HPI  Patient presents for annual CPE and follow up   Discussed the use of AI scribe software for clinical note transcription with the patient, who gave verbal consent to proceed.  History of Present Illness Olivia King is a 38 year old female who presents with symptoms of an upper respiratory infection.  She developed symptoms of an upper respiratory infection after her daughter experienced similar symptoms, including a sore throat, nasal congestion, headache, and body aches. Her daughter tested negative for COVID-19 and flu. She now experiences similar symptoms, including a sore throat, headache, and sneezing, but has not yet tested for COVID-19 herself. Her symptoms began today. No fever or chills.  She has a history of idiopathic intracranial hypertension for which she underwent a right transverse sigmoid sinus stent placement on December 17, 2023. This procedure has resolved her previous symptoms of pulsatile tinnitus and headaches that were exacerbated by sneezing, laughing, or coughing. She reports significant improvement in her quality of life since the procedure.  She has a history of generalized anxiety disorder and recurrent major depression, for which she is under the care of a psychiatrist and is taking venlafaxine . She feels emotionally stable despite recent job loss.  She experiences migraine headaches and has previously tried medications like Immungality and Topamax , which caused mental fog. She is currently not taking any migraine-specific medication to assess if the recent stent placement has alleviated her migraines.  She has hidradenitis suppurativa, which is currently managed with clindamycin  lotion. The condition is better, with lesions that do not open and are not painful.  She has  a history of vitamin D  and folic acid  deficiency, but recent blood work did not include these tests.  She experiences episodes of tachycardia and is taking metoprolol  for supraventricular tachycardia. Her heart rate is usually in the 80s or 90s, but she attributes recent increases to her current illness.  She has gained weight since stopping smoking and is considering gastric bypass surgery. She has tried GLP-1 medications without success. Her diet includes home-cooked meals with an emphasis on vegetables, but she admits to consuming sweet tea and sweets, which she identifies as dietary downfalls. She has started walking for exercise but is cautious due to being on blood thinners.      Diet: cooking more at home however still drinks sweet tea and likes bread and desserts Exercise: she has resumed walking again.   Last Eye Exam: completed Last Dental Exam: encouraged to complete  Flowsheet Row Office Visit from 12/24/2022 in [facility]  AUDIT-C Score 1   Depression: Phq 9 is  negative    12/29/2023    2:27 PM 07/21/2023    9:56 AM 06/09/2023    8:06 AM 05/05/2023    4:44 PM 03/17/2023   11:32 AM  Depression screen PHQ 2/9  Decreased Interest 0 0   1  Down, Depressed, Hopeless 0 0   1  PHQ - 2 Score 0 0   2  Altered sleeping 0 0   1  Tired, decreased energy 0 0   0  Change in appetite 0 0   0  Feeling bad or failure about yourself  0 0   0  Trouble concentrating 0 0   1  Moving slowly or fidgety/restless 0 0   0  Suicidal thoughts 0 0   0  PHQ-9 Score 0 0   4  Difficult doing work/chores Not difficult at all Not difficult at all   Somewhat difficult     Information is confidential and restricted. Go to Review Flowsheets to unlock data.   Hypertension: BP Readings from Last 3 Encounters:  12/29/23 122/80  12/13/23 113/83  11/03/23 (!) 137/91   Obesity: Wt Readings from Last 3 Encounters:  12/29/23 264 lb 9.6 oz (120 kg)  12/13/23 261 lb 14.5 oz  (118.8 kg)  11/03/23 262 lb (118.8 kg)   BMI Readings from Last 3 Encounters:  12/29/23 49.19 kg/m  12/13/23 46.39 kg/m  11/03/23 46.41 kg/m     Vaccines: reviewed with the patient.   Hep C Screening: completed STD testing and prevention (HIV/chl/gon/syphilis): N/A Intimate partner violence: negative screen  Sexual History :not sexually active Menstrual History/LMP/Abnormal Bleeding: Olivia King IUD Discussed importance of follow up if any post-menopausal bleeding: not applicable  Incontinence Symptoms: negative for symptoms   Breast cancer:  - Last Mammogram: N/A - BRCA gene screening: N/A  Osteoporosis Prevention : Discussed high calcium and vitamin D  supplementation, weight bearing exercises Bone density :not applicable   Cervical cancer screening: performing today  Skin cancer: Discussed monitoring for atypical lesions  Colorectal cancer: start at age 40     Advanced Care Planning: A voluntary discussion about advance care planning including the explanation and discussion of advance directives.  Discussed health care proxy and Living will, and the patient was able to identify a health care proxy as not sure at this time  .  Patient does not have a living will and power of attorney of health care   Patient Active Problem List   Diagnosis Date Noted   Idiopathic intracranial hypertension 03/17/2023   Carotid bruit 03/21/2022   Syncope 03/15/2022   Vertigo 02/18/2022   Insomnia 10/23/2021   Hidradenitis suppurativa 10/30/2020   Family history of pancreatic cancer 08/16/2020   Recurrent major depressive disorder, in full remission (HCC) 07/15/2020   MDD (major depressive disorder), recurrent episode, moderate (HCC) 02/13/2020   Tobacco use disorder 02/13/2020   MDD (major depressive disorder), recurrent episode, mild (HCC) 10/10/2019   GAD (generalized anxiety disorder) 10/10/2019   Eating disorder 10/10/2019   Lymphedema 04/10/2019   Chronic venous insufficiency  04/10/2019   Bilateral carpal tunnel syndrome 11/04/2018   Morbid obesity (HCC) 06/14/2017   Contraception management 01/06/2016   Recurrent major depressive disorder, in partial remission (HCC) 08/19/2015   Low serum vitamin D  12/24/2014   Migraine without aura 12/21/2014   Bilateral edema of lower extremity 12/21/2014    Past Surgical History:  Procedure Laterality Date   CESAREAN SECTION  2004 &2012   contraceptive implant     loletta IUD   IR TRANSCATH PLC STENT  INITIAL VEIN  INC ANGIOPLASTY  12/13/2023   IR US  GUIDE VASC ACCESS RIGHT  12/13/2023   IR VENO/JUGULAR LEFT  12/21/2023   IR VENO/JUGULAR RIGHT  12/21/2023   RADIOLOGY WITH ANESTHESIA N/A 12/13/2023   Procedure: RADIOLOGY WITH ANESTHESIA;  Surgeon: Lester Golas, MD;  Location: [facility] OR;  Service: Radiology;  Laterality: N/A;  Right transverse sigmoid sinus stenting   WISDOM TOOTH EXTRACTION      Family History  Problem Relation Age of Onset   Hypertension Mother    Pancreatic cancer Mother 29   Hypertension Brother    Aneurysm Maternal Grandmother  Heart attack Maternal Grandfather    Hypertension Paternal Grandmother    Heart failure Sister     Social History   Socioeconomic History   Marital status: Widowed    Spouse name: Not on file   Number of children: 2   Years of education: Not on file   Highest education level: Associate degree: occupational, Scientist, product/process development, or vocational program  Occupational History   Not on file  Tobacco Use   Smoking status: Former    Current packs/day: 0.00    Types: Cigarettes    Start date: 03/05/2011    Quit date: 06/06/2022    Years since quitting: 1.5   Smokeless tobacco: Never  Vaping Use   Vaping status: Every Day   Substances: Nicotine, Flavoring  Substance and Sexual Activity   Alcohol use: Not Currently   Drug use: No   Sexual activity: Not Currently    Partners: Male    Birth control/protection: I.U.D.    Comment: Patient has liletta  IUD  Other Topics Concern    Not on file  Social History Narrative   She is a widow, he died 01/10/2010 while he was deployed.    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   Social Drivers of Health   Financial Resource Strain: Low Risk  (06/09/2023)   Overall Financial Resource Strain (CARDIA)    Difficulty of Paying Living Expenses: Not hard at all  Food Insecurity: No Food Insecurity (06/09/2023)   Hunger Vital Sign    Worried About Running Out of Food in the Last Year: Never true    Ran Out of Food in the Last Year: Never true  Transportation Needs: No Transportation Needs (06/09/2023)   PRAPARE - Administrator, Civil Service (Medical): No    Lack of Transportation (Non-Medical): No  Physical Activity: Inactive (06/09/2023)   Exercise Vital Sign    Days of Exercise per Week: 0 days    Minutes of Exercise per Session: 0 min  Stress: Stress Concern Present (06/09/2023)   [employer] of Occupational Health - Occupational Stress Questionnaire    Feeling of Stress : To some extent  Social Connections: Moderately Isolated (06/09/2023)   Social Connection and Isolation Panel    Frequency of Communication with Friends and Family: More than three times a week    Frequency of Social Gatherings with Friends and Family: Once a week    Attends Religious Services: 1 to 4 times per year    Active Member of [employer] or Organizations: No    Attends Banker Meetings: Never    Marital Status: Widowed  Intimate Partner Violence: Not At Risk (06/09/2023)   Humiliation, Afraid, Rape, and Kick questionnaire    Fear of Current or Ex-Partner: No    Emotionally Abused: No    Physically Abused: No    Sexually Abused: No     Current Outpatient Medications:    aspirin  EC 325 MG tablet, Take 1 tablet (325 mg total) by mouth daily. Begin 2 days prior to stent procedure., Disp: 45 tablet, Rfl: 0   aspirin -acetaminophen -caffeine  (EXCEDRIN MIGRAINE) 250-250-65 MG tablet, Take 2 tablets by mouth every 6 (six) hours as  needed for headache or migraine., Disp: , Rfl:    clopidogrel  (PLAVIX ) 75 MG tablet, Take 1 tablet (75 mg total) by mouth daily., Disp: 90 tablet, Rfl: 0   EPINEPHrine 0.3 mg/0.3 mL IJ SOAJ injection, Inject into the muscle., Disp: , Rfl:    fluticasone  (FLONASE ) 50 MCG/ACT nasal spray, Place  2 sprays into both nostrils daily., Disp: 15.8 mL, Rfl: 1   folic acid  (FOLVITE ) 1 MG tablet, Take 1 mg by mouth daily., Disp: , Rfl:    hydrOXYzine  (ATARAX ) 10 MG tablet, Take 1 tablet (10 mg total) by mouth at bedtime as needed. (Patient taking differently: Take 10 mg by mouth at bedtime.), Disp: 90 tablet, Rfl: 1   ketorolac  (TORADOL ) 10 MG tablet, Take 1 tablet (10 mg total) by mouth every 6 (six) hours as needed for severe pain (pain score 7-10)., Disp: 20 tablet, Rfl: 0   lamoTRIgine  (LAMICTAL ) 25 MG tablet, Take 3 tablets (75 mg total) by mouth daily., Disp: 270 tablet, Rfl: 1   loratadine  (CLARITIN ) 10 MG tablet, Take 1 tablet (10 mg total) by mouth daily., Disp: 90 tablet, Rfl: 1   metroNIDAZOLE  (METROGEL ) 0.75 % vaginal gel, Place vaginally 2 (two) times daily., Disp: 70 g, Rfl: 2   ondansetron  (ZOFRAN ) 4 MG tablet, Take 1 tablet (4 mg total) by mouth every 8 (eight) hours as needed for nausea or vomiting., Disp: 10 tablet, Rfl: 0   venlafaxine  XR (EFFEXOR  XR) 75 MG 24 hr capsule, Take 1 capsule (75 mg total) by mouth daily with breakfast. (Patient taking differently: Take 75 mg by mouth See admin instructions.), Disp: 90 capsule, Rfl: 1   venlafaxine  XR (EFFEXOR -XR) 37.5 MG 24 hr capsule, Take 1 capsule (37.5 mg total) by mouth daily with breakfast. Take along with 75 mg daily, Disp: 90 capsule, Rfl: 0   clindamycin  (CLEOCIN  T) 1 % lotion, Apply 1 Application topically daily., Disp: 120 mL, Rfl: 5   Fremanezumab -vfrm (AJOVY ) 225 MG/1.5ML SOAJ, Inject 1.5 mLs into the skin every 30 (thirty) days. (Patient not taking: Reported on 12/29/2023), Disp: , Rfl:    metoprolol  succinate (TOPROL -XL) 25 MG 24 hr  tablet, Take 1 tablet (25 mg total) by mouth daily., Disp: 90 tablet, Rfl: 1   montelukast  (SINGULAIR ) 10 MG tablet, Take 1 tablet (10 mg total) by mouth at bedtime., Disp: 90 tablet, Rfl: 1   Multiple Vitamin (MULTI-VITAMINS) TABS, Take by mouth. (Patient not taking: Reported on 12/29/2023), Disp: , Rfl:    torsemide  (DEMADEX ) 20 MG tablet, Take 1 tablet (20 mg total) by mouth every three (3) days as needed., Disp: 30 tablet, Rfl: 0   vitamin B-12 (CYANOCOBALAMIN ) 500 MCG tablet, Take 500 mcg by mouth daily. (Patient not taking: Reported on 12/29/2023), Disp: , Rfl:   Allergies  Allergen Reactions   Sumatriptan  Anaphylaxis    Throat and tongue swelling    Penicillins Rash and Dermatitis   Vortioxetine  Other (See Comments)    Tingling hands and blurred vision  Trintellix    Doxycycline      Unable to take it currently due to intracranial hypertension      ROS  Ten systems reviewed and is negative except as mentioned in HPI    Objective  Vitals:   12/29/23 1434 12/29/23 1435  BP: 122/80   Pulse: (!) 114 (!) 103  Resp: 16   Temp: 98.8 F (37.1 C)   TempSrc: Oral   SpO2: 96%   Weight: 264 lb 9.6 oz (120 kg)   Height: 5' 1.5 (1.562 m)     Body mass index is 49.19 kg/m.  Physical Exam  Constitutional: Patient appears well-developed and well-nourished. No distress.  HENT: Head: Normocephalic and atraumatic. Ears: B TMs ok, no erythema or effusion; Nose: Nose normal. Mouth/Throat: Oropharynx is clear and moist. No oropharyngeal exudate.  Eyes: Conjunctivae and EOM are  normal. Pupils are equal, round, and reactive to light. No scleral icterus.  Neck: Normal range of motion. Neck supple. No JVD present. No thyromegaly present.  Cardiovascular: Normal rate, regular rhythm and normal heart sounds. Lymphadenopathy  Pulmonary/Chest: Effort normal and breath sounds normal. No respiratory distress. Abdominal: Soft. Bowel sounds are normal, no distension. There is no tenderness. no  masses Breast: no lumps or masses, no nipple discharge or rashes FEMALE GENITALIA:  External genitalia normal External urethra normal Vaginal vault showed thick white discharge Cervix normal without discharge or lesions, iud in place  Bimanual exam normal without masses RECTAL: not done  Musculoskeletal: Normal range of motion, no joint effusions. No gross deformities Neurological: he is alert and oriented to person, place, and time. No cranial nerve deficit. Coordination, balance, strength, speech and gait are normal.  Skin: Skin is warm and dry. No rash noted. No erythema.  Psychiatric: Patient has a normal mood and affect. behavior is normal. Judgment and thought content normal.      Assessment & Plan Acute viral upper respiratory infection Acute viral upper respiratory infection with sore throat, nasal congestion, and headache. COVID-19 test negative.  Benign intracranial hypertension, status post right transverse sigmoid sinus stent Status post right transverse sigmoid sinus stent placement for idiopathic intracranial hypertension. Symptoms of pulsatile tinnitus resolved post-procedure.  Recurrent migraine headaches Recurrent migraine headaches previously managed with Topamax , discontinued due to mental fog. Currently not on migraine prophylaxis to assess if migraines resolve post-stent placement. Insurance issues with Ajovy  and other prescribed medications. - Monitor for recurrence of migraines post-stent placement. Engineer, maintenance (IT) insurance for prior authorization of prescribed migraine medications.  Tachycardia Tachycardia previously evaluated with normal thyroid and iron levels. Managed with metoprolol . Recent increase in heart rate possibly due to acute illness. - Continue metoprolol .  Generalized anxiety disorder and recurrent major depressive disorder Generalized anxiety disorder and recurrent major depressive disorder managed with venlafaxine . Reports feeling emotionally  stable despite recent job loss. Continues regular follow-ups with psychiatrist. - Continue venlafaxine  37.5 mg and 75 mg. - Continue regular psychiatric follow-ups.  Hidradenitis suppurativa Hidradenitis suppurativa managed with clindamycin  lotion. Lesions present but not painful or open. Prefers solution form for ease of application. - Prescribe clindamycin  lotion. - Consider clindamycin  solution if available and approved by pharmacy.  Obesity/Morbid Obesity with recent weight gain attributed to cessation of smoking. Previous trial of GLP-1 agonists was ineffective. Discussed potential for gastric bypass surgery, contingent on insurance coverage. Encouraged dietary modifications and physical activity. - Encourage dietary modifications, including reducing sweet tea and carbohydrate intake. - Encourage regular physical activity, including walking. - Discuss potential for gastric bypass surgery with new insurance coverage.  Vitamin D  and folic acid  deficiency Vitamin D  and folic acid  deficiency previously identified. Recent blood work did not include these tests. - Order vitamin D  and folic acid  levels.  Adult Wellness Visit Routine adult wellness visit conducted. Discussed dietary habits, including high intake of sweet tea and carbohydrates, and the need to reduce these for better health. Encouraged cooking at home and incorporating vegetables into meals. Discussed exercise habits and encouraged walking despite being on blood thinners. - Perform Pap smear with HPV testing. - Administer pneumonia vaccination. - Order B12 and vitamin D  levels. - Encourage reduction of sweet tea and carbohydrate intake. - Encourage regular physical activity, including walking.    -USPSTF grade A and B recommendations reviewed with patient; age-appropriate recommendations, preventive care, screening tests, etc discussed and encouraged; healthy living encouraged; see AVS for patient education  given to  patient -Discussed importance of 150 minutes of physical activity weekly, eat two servings of fish weekly, eat one serving of tree nuts ( cashews, pistachios, pecans, almonds.SABRA) every other day, eat 6 servings of fruit/vegetables daily and drink plenty of water and avoid sweet beverages.   -Reviewed Health Maintenance: Yes.

## 2023-12-30 LAB — CBC WITH DIFFERENTIAL/PLATELET
Absolute Lymphocytes: 2066 {cells}/uL (ref 850–3900)
Absolute Monocytes: 802 {cells}/uL (ref 200–950)
Basophils Absolute: 8 {cells}/uL (ref 0–200)
Basophils Relative: 0.1 %
Eosinophils Absolute: 8 {cells}/uL — ABNORMAL LOW (ref 15–500)
Eosinophils Relative: 0.1 %
HCT: 39.1 % (ref 35.0–45.0)
Hemoglobin: 12.9 g/dL (ref 11.7–15.5)
MCH: 29.5 pg (ref 27.0–33.0)
MCHC: 33 g/dL (ref 32.0–36.0)
MCV: 89.5 fL (ref 80.0–100.0)
MPV: 9.5 fL (ref 7.5–12.5)
Monocytes Relative: 9.9 %
Neutro Abs: 5216 {cells}/uL (ref 1500–7800)
Neutrophils Relative %: 64.4 %
Platelets: 356 Thousand/uL (ref 140–400)
RBC: 4.37 Million/uL (ref 3.80–5.10)
RDW: 13.1 % (ref 11.0–15.0)
Total Lymphocyte: 25.5 %
WBC: 8.1 Thousand/uL (ref 3.8–10.8)

## 2023-12-30 LAB — B12 AND FOLATE PANEL
Folate: 4.7 ng/mL — ABNORMAL LOW
Vitamin B-12: 340 pg/mL (ref 200–1100)

## 2023-12-30 LAB — COMPREHENSIVE METABOLIC PANEL WITH GFR
AG Ratio: 1.4 (calc) (ref 1.0–2.5)
ALT: 22 U/L (ref 6–29)
AST: 28 U/L (ref 10–30)
Albumin: 4.2 g/dL (ref 3.6–5.1)
Alkaline phosphatase (APISO): 68 U/L (ref 31–125)
BUN: 9 mg/dL (ref 7–25)
CO2: 29 mmol/L (ref 20–32)
Calcium: 10 mg/dL (ref 8.6–10.2)
Chloride: 101 mmol/L (ref 98–110)
Creat: 0.84 mg/dL (ref 0.50–0.97)
Globulin: 3.1 g/dL (ref 1.9–3.7)
Glucose, Bld: 89 mg/dL (ref 65–99)
Potassium: 5 mmol/L (ref 3.5–5.3)
Sodium: 137 mmol/L (ref 135–146)
Total Bilirubin: 0.6 mg/dL (ref 0.2–1.2)
Total Protein: 7.3 g/dL (ref 6.1–8.1)
eGFR: 91 mL/min/1.73m2 (ref >=60)

## 2023-12-30 LAB — LIPID PANEL
Cholesterol: 216 mg/dL — ABNORMAL HIGH (ref <200)
HDL: 51 mg/dL (ref >=50)
LDL Cholesterol (Calc): 137 mg/dL — ABNORMAL HIGH
Non-HDL Cholesterol (Calc): 165 mg/dL — ABNORMAL HIGH (ref <130)
Total CHOL/HDL Ratio: 4.2 (calc) (ref <5.0)
Triglycerides: 149 mg/dL (ref <150)

## 2023-12-30 LAB — TSH: TSH: 2.37 m[IU]/L

## 2023-12-30 LAB — VITAMIN D 25 HYDROXY (VIT D DEFICIENCY, FRACTURES): Vit D, 25-Hydroxy: 22 ng/mL — ABNORMAL LOW (ref 30–100)

## 2023-12-30 LAB — HEMOGLOBIN A1C
Hgb A1c MFr Bld: 5.6 % (ref <5.7)
Mean Plasma Glucose: 114 mg/dL
eAG (mmol/L): 6.3 mmol/L

## 2024-01-03 ENCOUNTER — Ambulatory Visit: Payer: Self-pay | Admitting: Family Medicine

## 2024-01-03 LAB — CYTOLOGY - PAP
Adequacy: ABSENT
Comment: NEGATIVE
Diagnosis: NEGATIVE
High risk HPV: NEGATIVE

## 2024-01-03 NOTE — H&P (Signed)
 Referring Physician:  Caffaro, Kaitlin T, PA-C [address] [place],  [place] [address]   Primary Physician:  Glenard Mire, MD   Chief Complaint: Audible pulsations in the right ear, blurred vision   History of Present Illness: Olivia King is a 38 y.o. female who presents with the chief complaint of audible pulsations in the right ear.  She also complains of blurred vision.   She has had the pulsations for about 5 years.  There is a whooshing sound which follows her heart rate.  The sound goes away when she compresses her jugular vein and the neck.  It improved with a lumbar puncture.  She had severe headaches after the lumbar puncture and then had a blood patch.  The blood patch fixed the headaches but then the noise came back immediately.   I reviewed her MR angiogram from 09/11/2023, as well as her brain MRI with contrast from January 02, 2023.  Her arteries are normal.   She has a dominant right transverse/sigmoid sinus with a stenosis of the right transverse/sigmoid sinus junction.   She is overweight with a BMI of 46.4.  The opening pressure on her lumbar puncture was 20 cm of water.   She does have a history of migraine headaches since she was 38 years old.  These often times do respond to migraine treatments such as Excedrin, but not always.  She has been on a number of different preventative medications with variable response.   Review of Systems:   Review of Systems  HENT:  Positive for tinnitus.   Eyes:  Positive for blurred vision.  Respiratory:  Negative for shortness of breath.   Cardiovascular:  Negative for chest pain.  Gastrointestinal:  Negative for blood in stool.  Genitourinary:  Negative for hematuria.  Neurological:  Positive for headaches.   She quit smoking.  She does continue to vape.   Exam:    Today's Vitals    11/03/23 1425  BP: (!) 137/91  Pulse: 96  Weight: 262 lb (118.8 kg)  Height: 5' 3 (1.6 m)    Body mass index is 46.41  kg/m.     Physical Exam Vitals reviewed: Overweight.  Constitutional:      Appearance: Normal appearance.  HENT:     Mouth/Throat:     Mouth: Mucous membranes are moist.     Pharynx: Oropharynx is clear.  Cardiovascular:     Rate and Rhythm: Normal rate and regular rhythm.     Heart sounds: Normal heart sounds.  Pulmonary:     Effort: Pulmonary effort is normal.     Breath sounds: Normal breath sounds.  Neurological:     Mental Status: She is alert.     Comments: Alert and oriented with normal speech expression, fluency and comprehension. Visual fields are full to confrontation.   Face is symmetric. Strength in the arms and legs is symmetric with no drift.  Sensation is normal and symmetric. No ataxia. No inattention.      ASA score: 2   Mallampati score: 4   Imaging:   See HPI   Assessment and Plan:   1.  Idiopathic intracranial hypertension 2.  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  Audible pulsations most likely coming from the stenosis 4.  Visual disturbance possibly related 5.  Headaches of mixed cause.  Clearly a migraine component.  Most likely also any idiopathic intracranial hypertension component.   Recommendation:   1.  Stenting of the right transverse/sigmoid sinus with  anesthesia.   We talked about the diagnosis.  I have explained that there is a good chance that the stenosis is the cause of her audible pulsations.  However, I have also explained that stenting does not always relieve the audible pulsations.  I estimated around 1 out of 5 patients do not experience release of their symptoms despite a successful stent procedure.  I have explained that her visual symptoms may or may not be related and may or may not improve.  I have explained that her headaches are unlikely to improve.   We also talked about the potential downside of stenting, and especially the small risk, 1% or less, of a serious complication such as stent  thrombosis, ischemic or hemorrhagic stroke which could be disabling.   She understands the above but her symptoms are significant enough that she would like to go ahead and proceed with the stent placement.   I will start her on clopidogrel  1 week before the procedure and aspirin  48 hours before the procedure and continue for 1 month afterwards.        Electronically signed by Lester Golas, MD at 11/03/2023  3:17 PM  I have reviewed and confirmed my history and physical from 11/03/23 with no additions or changes. Plan for Right transverse/sigmoid sinus stent.  Risks and benefits reviewed.

## 2024-01-07 ENCOUNTER — Ambulatory Visit (INDEPENDENT_AMBULATORY_CARE_PROVIDER_SITE_OTHER): Admitting: Professional Counselor

## 2024-01-07 DIAGNOSIS — F411 Generalized anxiety disorder: Secondary | ICD-10-CM

## 2024-01-07 DIAGNOSIS — F330 Major depressive disorder, recurrent, mild: Secondary | ICD-10-CM

## 2024-01-07 DIAGNOSIS — F439 Reaction to severe stress, unspecified: Secondary | ICD-10-CM | POA: Diagnosis not present

## 2024-01-07 NOTE — Progress Notes (Signed)
  THERAPIST PROGRESS NOTE  Virtual Visit via Video Note  I connected with Olivia King on 01/08/24 at 11:00 AM EDT by a video enabled telemedicine application and verified that I am speaking with the correct person using two identifiers.  Location: Patient: Home Provider: Remote office   I discussed the limitations of evaluation and management by telemedicine and the availability of in person appointments. The patient expressed understanding and agreed to proceed.   I discussed the assessment and treatment plan with the patient. The patient was provided an opportunity to ask questions and all were answered. The patient agreed with the plan and demonstrated an understanding of the instructions.   The patient was advised to call back or seek an in-person evaluation if the symptoms worsen or if the condition fails to improve as anticipated.  I provided 53 minutes of non-face-to-face time during this encounter. Olivia King, [facility]  Session Time: 11:02 AM - 11:55 AM  Participation Level: Active  Behavioral Response: Well Groomed, Alert, Dysphoric  Type of Therapy: Individual Therapy  Treatment Goals addressed: Active OP Depression  LTG: Whenever I get into that deep depression, to be able to pull myself out in a short manner instead of letting it linger. Ways to forget thinking about the past.               Start:  07/09/23    Expected End:  07/07/24      STG: I'm gonna be honest with you, it's my kid. She's always mean and angry. To improve interpersonal effectiveness AEB increased positive interactions and decrease in negative emotional responses over the next 90 days.     STG: Maybe coping skills. To increase knowledge of coping skills AEB psychoeducation and practice of skills over the next 90 days.   ProgressTowards Goals: Progressing  Interventions: CBT, Motivational Interviewing, and Supportive  Summary: Olivia King is a 38 y.o. female who presents with a  history of anxiety, depression, and trauma. She appeared alert and oriented x5. She shared updates about her daughter. She reported she is going to try and get her daughter into therapy at another location. Shekelia explored contributing factors to daughter's behavior and possible tips to interacting and responding to her daughter. Reviewed ABC worksheet and advised Allahna to complete next set of worksheets all on traumatic events from the past.    Therapist Response: Conducted session with [place]. Began session with check-in/update since previous session. Utilized empathetic and reflective listening. Used open-ended questions to facilitate discussion and summarized [name]'s thoughts/feelings. Explored contributing factors and possible tips to responding and interacting with her daughter. Reviewed ABC worksheets and assigned them to be completed with all traumatic events. Scheduled additional appointment and concluded session.   Suicidal/Homicidal: No  Plan: Return again in 2 weeks.  Diagnosis: MDD (major depressive disorder), recurrent episode, mild (HCC)  GAD (generalized anxiety disorder)  Trauma and stressor-related disorder  Collaboration of Care: Medication Management AEB chart review  Patient/Guardian was advised Release of Information must be obtained prior to any record release in order to collaborate their care with an outside provider. Patient/Guardian was advised if they have not already done so to contact the registration department to sign all necessary forms in order for us  to release information regarding their care.   Consent: Patient/Guardian gives verbal consent for treatment and assignment of benefits for services provided during this visit. Patient/Guardian expressed understanding and agreed to proceed.   Olivia King, [facility] 01/08/2024

## 2024-01-12 ENCOUNTER — Ambulatory Visit: Admitting: Neuroradiology

## 2024-01-12 ENCOUNTER — Encounter: Payer: Self-pay | Admitting: Neuroradiology

## 2024-01-12 VITALS — BP 122/87 | HR 98 | Ht 63.0 in | Wt 264.0 lb

## 2024-01-12 DIAGNOSIS — G932 Benign intracranial hypertension: Secondary | ICD-10-CM

## 2024-01-12 DIAGNOSIS — Z95828 Presence of other vascular implants and grafts: Secondary | ICD-10-CM

## 2024-01-12 DIAGNOSIS — I6789 Other cerebrovascular disease: Secondary | ICD-10-CM

## 2024-01-12 MED ORDER — CLOPIDOGREL BISULFATE 75 MG PO TABS: 75.0000 mg | ORAL_TABLET | Freq: Every day | ORAL | Status: AC

## 2024-01-12 MED ORDER — ASPIRIN 81 MG PO TBEC: 81.0000 mg | DELAYED_RELEASE_TABLET | Freq: Every day | ORAL | 0 refills | Status: AC

## 2024-01-12 NOTE — Patient Instructions (Signed)
 2 Month telephone visit per Dr. Lester.

## 2024-01-12 NOTE — Progress Notes (Signed)
 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  Her audible pulsations on the right side have completely resolved.  She is now hearing pulsations on the left, although the sound is intermittent, and much less bothersome.  Her puncture site of the right arm is healed.  I have asked her to continue with the Plavix  for 2 additional months.  She can reduce the aspirin  to 81 mg and should continue that for a year.  She asked about alcohol use.  She is only wanting to have 1-2 drinks a month, which is totally fine.  I will follow-up with her on the telephone in 2 months.

## 2024-01-19 ENCOUNTER — Ambulatory Visit: Admitting: Family Medicine

## 2024-01-21 ENCOUNTER — Ambulatory Visit: Admitting: Professional Counselor

## 2024-01-31 ENCOUNTER — Telehealth: Payer: Self-pay

## 2024-01-31 NOTE — Telephone Encounter (Signed)
 Immunization report printed and notified patient.

## 2024-01-31 NOTE — Telephone Encounter (Signed)
 Copied from CRM [id number]. Topic: General - Other >> Jan 31, 2024  8:44 AM Burnard DEL wrote: Reason for CRM: Patient would like a copy of her immunization records printed for her to pick up.Please call patient when they are ready.

## 2024-02-07 ENCOUNTER — Other Ambulatory Visit: Payer: Self-pay | Admitting: Family Medicine

## 2024-02-07 ENCOUNTER — Encounter: Payer: Self-pay | Admitting: Family Medicine

## 2024-02-07 ENCOUNTER — Ambulatory Visit: Admitting: Professional Counselor

## 2024-02-07 DIAGNOSIS — R600 Localized edema: Secondary | ICD-10-CM

## 2024-02-07 MED ORDER — FLUTICASONE PROPIONATE 50 MCG/ACT NA SUSP: 2.0000 | Freq: Every day | NASAL | 1 refills | Status: AC

## 2024-02-07 MED ORDER — TORSEMIDE 20 MG PO TABS: 20.0000 mg | ORAL_TABLET | ORAL | 0 refills | Status: AC | PRN

## 2024-02-14 ENCOUNTER — Telehealth: Admitting: Psychiatry

## 2024-02-23 ENCOUNTER — Ambulatory Visit (INDEPENDENT_AMBULATORY_CARE_PROVIDER_SITE_OTHER): Admitting: Professional Counselor

## 2024-02-23 DIAGNOSIS — F439 Reaction to severe stress, unspecified: Secondary | ICD-10-CM

## 2024-02-23 DIAGNOSIS — F411 Generalized anxiety disorder: Secondary | ICD-10-CM | POA: Diagnosis not present

## 2024-02-23 DIAGNOSIS — F331 Major depressive disorder, recurrent, moderate: Secondary | ICD-10-CM | POA: Diagnosis not present

## 2024-02-23 NOTE — Progress Notes (Signed)
  THERAPIST PROGRESS NOTE  Session Time: 8:10 AM - 9:00 AM   Participation Level: Active  Behavioral Response: Well Groomed, Alert, Anxious and Dysphoric  Type of Therapy: Individual Therapy  Treatment Goals addressed: Active OP Depression  LTG: Whenever I get into that deep depression, to be able to pull myself out in a short manner instead of letting it linger. Ways to forget thinking about the past.  (Progressing)    Start:  07/09/23    Expected End:  07/07/24    Goal Note Reviewed 02/23/24 - I hit that depression that week but I was able to get out of it. So out of a 10 of depression, I was at a 4 or 5.   STG: I'm gonna be honest with you, it's my kid. She's always mean and angry. To improve interpersonal effectiveness AEB increased positive interactions and decrease in negative emotional responses over the next 90 days.  (Progressing)  Goal Note Reviewed 02/23/24 I've been getting better.   STG: Maybe coping skills. To increase knowledge of coping skills AEB psychoeducation and practice of skills over the next 90 days.  (Progressing)  Goal Note Reviewed 02/23/24 - I say pretty good.   ProgressTowards Goals: Progressing  Interventions: Motivational Interviewing, Solution Focused, and Supportive  Summary: Berdene Askari is a 38 y.o. female who presents with a history of anxiety, depression, and trauma. She appeared alert and oriented x5. She was happy to report she found another job at [employer]. She reported it's going well so far. Bee stated things have continued to be hard at home with her daughter. She shared recent event and processed her own thoughts/feelings. She maintains plans to call and get her daughter scheduled for therapy. [name] noted progress on goals and areas for continued improvement.   Therapist Response: Conducted session with [employer]. Began session with check-in/update since previous session. Utilized empathetic and reflective listening.  Used open-ended questions to facilitate discussion and summarized Erik's thoughts/feelings. Discussed possible contributing factors and solutions to resolving family conflict. Encouraged [name] to follow-up with scheduling her daughter for therapy or to seek family therapy. Reviewed treatment plan with input from [facility] on current strengths, needs, and progress towards goals. Scheduled additional appointment and concluded session.  Suicidal/Homicidal: No  Plan: Return again in 3 weeks.  Diagnosis: GAD (generalized anxiety disorder)  MDD (major depressive disorder), recurrent episode, moderate (HCC)  Trauma and stressor-related disorder  Collaboration of Care: Medication Management AEB chart review  Patient/Guardian was advised Release of Information must be obtained prior to any record release in order to collaborate their care with an outside provider. Patient/Guardian was advised if they have not already done so to contact the registration department to sign all necessary forms in order for us  to release information regarding their care.   Consent: Patient/Guardian gives verbal consent for treatment and assignment of benefits for services provided during this visit. Patient/Guardian expressed understanding and agreed to proceed.   [name], [facility] -  02/23/2024

## 2024-02-24 ENCOUNTER — Ambulatory Visit: Admitting: Psychiatry

## 2024-02-25 ENCOUNTER — Telehealth: Payer: Self-pay

## 2024-02-25 NOTE — Telephone Encounter (Signed)
 Patient called wanted to send message to Dr. Lester, patient said she has been feeling fine since surgery in August until yesterday.   Yesterday, she started developing symptoms that she previously had before surgery with the exception of Ringing in the ears.   Her speech is not slurred, Patient has difficulty saying words and she thinks one thing but what comes out is different  She has been feeling nauseous, loss of balance and she mentioned her eyes have some sort of delay when she is looking side to side. Again she reiterates that these are not new symptoms.   She denies any loss of function on either side of the body, denies facial drooping and slurred speech.   I educated her that if she develop any new neurological symptoms such as those mentioned above then she should seek emergency treatment.   Patient had a follow up scheduled for 03/15/2024. I moved this up so that Dr. Lester can see her sooner.

## 2024-02-28 ENCOUNTER — Telehealth: Payer: Self-pay | Admitting: Neuroradiology

## 2024-02-28 NOTE — Telephone Encounter (Addendum)
 Called patient  to reschedule appt, to Monday 11/17 or the following week 11/26 . VM Full

## 2024-03-03 ENCOUNTER — Other Ambulatory Visit: Payer: Self-pay | Admitting: Neuroradiology

## 2024-03-03 NOTE — Telephone Encounter (Signed)
 Spoke to patient. Automatic refill from pharmacy.

## 2024-03-06 ENCOUNTER — Ambulatory Visit (INDEPENDENT_AMBULATORY_CARE_PROVIDER_SITE_OTHER): Admitting: Neuroradiology

## 2024-03-06 DIAGNOSIS — G932 Benign intracranial hypertension: Secondary | ICD-10-CM

## 2024-03-06 NOTE — Progress Notes (Addendum)
 I spoke with Olivia King on the phone today.  She was at home and I was here in the office on [facility].  Her audible pulsations on the right have really not recurred since the stent placement in the right transverse/sigmoid sinus junction 12/13/2023.  She does still have some audible pulsations on the left.  These are tolerable.  She continues to have migraines.  It sounds as if she occasionally has visual auras without a headache, and sometimes a headache without visual auras.  She occasionally needs Excedrin to abort a migraine, and this is usually successful.  She can stop taking the clopidogrel  at this point, as it has been 3 months.  I have asked her to continue with 81 mg aspirin  for a year.  She may take the Excedrin from time to time as needed, but this should not be a daily medication.  I have not scheduled any further routine follow-up with her.  Glad to see her again anytime as needed.  Audio only, 10 minutes

## 2024-03-08 ENCOUNTER — Telehealth: Admitting: Neuroradiology

## 2024-03-14 ENCOUNTER — Ambulatory Visit (INDEPENDENT_AMBULATORY_CARE_PROVIDER_SITE_OTHER): Admitting: Professional Counselor

## 2024-03-14 DIAGNOSIS — F331 Major depressive disorder, recurrent, moderate: Secondary | ICD-10-CM | POA: Diagnosis not present

## 2024-03-14 DIAGNOSIS — F411 Generalized anxiety disorder: Secondary | ICD-10-CM

## 2024-03-14 NOTE — Progress Notes (Signed)
 THERAPIST PROGRESS NOTE  Virtual Visit via Video Note  I connected with [name] Flatter on 03/14/24 at  3:00 PM EST by a video enabled telemedicine application and verified that I am speaking with the correct person using two identifiers.  Location: Patient: Home Provider: Remote office   I discussed the limitations of evaluation and management by telemedicine and the availability of in person appointments. The patient expressed understanding and agreed to proceed.   I discussed the assessment and treatment plan with the patient. The patient was provided an opportunity to ask questions and all were answered. The patient agreed with the plan and demonstrated an understanding of the instructions.   The patient was advised to call back or seek an in-person evaluation if the symptoms worsen or if the condition fails to improve as anticipated.  I provided 32 minutes of non-face-to-face time during this encounter. [name], [facility]  Session Time: 3:01 PM - 3:33 PM  Participation Level: Active  Behavioral Response: Casual, Alert, Anxious  Type of Therapy: Individual Therapy  Treatment Goals addressed: Active OP Depression  LTG: Whenever I get into that deep depression, to be able to pull myself out in a short manner instead of letting it linger. Ways to forget thinking about the past.  (Progressing)                Start:  07/09/23    Expected End:  07/07/24    Goal Note Reviewed 02/23/24 - I hit that depression that week but I was able to get out of it. So out of a 10 of depression, I was at a 4 or 5.    STG: I'm gonna be honest with you, it's my kid. She's always mean and angry. To improve interpersonal effectiveness AEB increased positive interactions and decrease in negative emotional responses over the next 90 days.  (Progressing)  Goal Note Reviewed 02/23/24 I've been getting better.    STG: Maybe coping skills. To increase knowledge of coping skills AEB  psychoeducation and practice of skills over the next 90 days.  (Progressing)  Goal Note Reviewed 02/23/24 - I say pretty good.    ProgressTowards Goals: Progressing  Interventions: CBT, Motivational Interviewing, Supportive, and Social Skills Training  Summary: Estelita Iten is a 38 y.o. female who presents with a history of anxiety and depression. She appeared somber but oriented x5. She stated things are okay. She noted some stressors at work. Bergen shared about interactions. She was receptive to Socratic questioning and other perspectives. She expressed concern about being ready to engage, but was open to starting with basic communication, like salutations. Masie reported her daughter was assessed by [facility] and they are scheduled to start intensive in-home therapy tomorrow. She noted plans for Thanksgiving.   Therapist Response: Conducted session with [employer]. Began session with check-in/update since previous session. Utilized empathetic and reflective listening. Used open-ended questions to facilitate discussion and summarized Alec's thoughts/feelings. Used Socratic questioning to help challenge negative thinking and offered different perspectives. Encouraged Shiza to consider engaging rather than avoiding and discussed the benefits. Gained update on family dynamics and daughter's care. Scheduled additional appointment and concluded session.   Suicidal/Homicidal: No  Plan: Return again in 2 weeks.  Diagnosis: GAD (generalized anxiety disorder)  MDD (major depressive disorder), recurrent episode, moderate (HCC)  Collaboration of Care: Medication Management AEB chart review  Patient/Guardian was advised Release of Information must be obtained prior to any record release in order to collaborate their care with an outside provider.  Patient/Guardian was advised if they have not already done so to contact the registration department to sign all necessary forms in order for us  to release  information regarding their care.   Consent: Patient/Guardian gives verbal consent for treatment and assignment of benefits for services provided during this visit. Patient/Guardian expressed understanding and agreed to proceed.   [name], [facility] 03/14/2024

## 2024-03-15 ENCOUNTER — Telehealth: Admitting: Neuroradiology

## 2024-03-29 ENCOUNTER — Ambulatory Visit (INDEPENDENT_AMBULATORY_CARE_PROVIDER_SITE_OTHER): Admitting: Professional Counselor

## 2024-03-29 DIAGNOSIS — F331 Major depressive disorder, recurrent, moderate: Secondary | ICD-10-CM

## 2024-03-29 DIAGNOSIS — F411 Generalized anxiety disorder: Secondary | ICD-10-CM | POA: Diagnosis not present

## 2024-03-29 DIAGNOSIS — F439 Reaction to severe stress, unspecified: Secondary | ICD-10-CM

## 2024-03-29 NOTE — Progress Notes (Signed)
 THERAPIST PROGRESS NOTE  Virtual Visit via Video Note  I connected with Olivia King on 03/29/24 at  3:00 PM EST by a video enabled telemedicine application and verified that I am speaking with the correct person using two identifiers.  Location: Patient: Community (parked car) Provider: Office   I discussed the limitations of evaluation and management by telemedicine and the availability of in person appointments. The patient expressed understanding and agreed to proceed.   I discussed the assessment and treatment plan with the patient. The patient was provided an opportunity to ask questions and all were answered. The patient agreed with the plan and demonstrated an understanding of the instructions.   The patient was advised to call back or seek an in-person evaluation if the symptoms worsen or if the condition fails to improve as anticipated.  I provided 46 minutes of non-face-to-face time during this encounter. Olivia King, [facility]  Session Time: 3:07 PM - 3:53 PM  Participation Level: Active  Behavioral Response: Casual, Alert, Dysphoric  Type of Therapy: Individual Therapy  Treatment Goals addressed: Active OP Depression  LTG: Whenever I get into that deep depression, to be able to pull myself out in a short manner instead of letting it linger. Ways to forget thinking about the past.  (Progressing)                Start:  07/09/23    Expected End:  07/07/24    Goal Note Reviewed 02/23/24 - I hit that depression that week but I was able to get out of it. So out of a 10 of depression, I was at a 4 or 5.    STG: I'm gonna be honest with you, it's my kid. She's always mean and angry. To improve interpersonal effectiveness AEB increased positive interactions and decrease in negative emotional responses over the next 90 days.  (Progressing)  Goal Note Reviewed 02/23/24 I've been getting better.    STG: Maybe coping skills. To increase knowledge of coping skills  AEB psychoeducation and practice of skills over the next 90 days.  (Progressing)  Goal Note Reviewed 02/23/24 - I say pretty good.   ProgressTowards Goals: Progressing  Interventions: CBT and Supportive  Summary: Olivia King is a 38 y.o. female who presents with a history of anxiety, depression, and trauma. She appeared somber but oriented x5. She stated things have been okay. They had the first appointment with [facility] for her daughter. However, they haven't had another follow-up yet due to some insurance issues. Estha continues to have issues with her daughter. She expressed her feelings about the situation. She feels uncertain about how she wants to move forward. She engaged in various ways to respond.   Therapist Response: Conducted session with [employer]. Began session with check-in/update since previous session. Utilized empathetic and reflective listening. Used open-ended questions to facilitate discussion and summarized Ellysa's thoughts/feelings. Explored ways to respond to daughter and normalized feelings due to the situation. Scheduled additional appointment and concluded session.   Suicidal/Homicidal: No  Plan: Return again in 2 weeks.  Diagnosis: MDD (major depressive disorder), recurrent episode, moderate (HCC)  Trauma and stressor-related disorder  GAD (generalized anxiety disorder)  Collaboration of Care: Medication Management AEB chart review  Patient/Guardian was advised Release of Information must be obtained prior to any record release in order to collaborate their care with an outside provider. Patient/Guardian was advised if they have not already done so to contact the registration department to sign all necessary forms in order for  us  to release information regarding their care.   Consent: Patient/Guardian gives verbal consent for treatment and assignment of benefits for services provided during this visit. Patient/Guardian expressed understanding and agreed to  proceed.   Olivia King, [facility] 03/29/2024

## 2024-04-05 ENCOUNTER — Encounter: Payer: Self-pay | Admitting: Family Medicine

## 2024-04-14 ENCOUNTER — Ambulatory Visit (INDEPENDENT_AMBULATORY_CARE_PROVIDER_SITE_OTHER): Admitting: Professional Counselor

## 2024-04-14 DIAGNOSIS — F439 Reaction to severe stress, unspecified: Secondary | ICD-10-CM | POA: Diagnosis not present

## 2024-04-14 DIAGNOSIS — F330 Major depressive disorder, recurrent, mild: Secondary | ICD-10-CM | POA: Diagnosis not present

## 2024-04-14 DIAGNOSIS — F411 Generalized anxiety disorder: Secondary | ICD-10-CM | POA: Diagnosis not present

## 2024-04-14 NOTE — Progress Notes (Signed)
 " THERAPIST PROGRESS NOTE  Virtual Visit via Video Note  I connected with Olivia King on 04/14/2024 at 10:00 AM EST by a video enabled telemedicine application and verified that I am speaking with the correct person using two identifiers.  Location: Patient: Home Provider: Remote office   I discussed the limitations of evaluation and management by telemedicine and the availability of in person appointments. The patient expressed understanding and agreed to proceed.  I discussed the assessment and treatment plan with the patient. The patient was provided an opportunity to ask questions and all were answered. The patient agreed with the plan and demonstrated an understanding of the instructions.   The patient was advised to call back or seek an in-person evaluation if the symptoms worsen or if the condition fails to improve as anticipated.  I provided 37 minutes of non-face-to-face time during this encounter. Olivia King, [facility]  Session Time: 10:01 AM - 10:38 AM   Participation Level: Active  Behavioral Response: Casual, Alert, Dysphoric  Type of Therapy: Individual Therapy  Treatment Goals addressed: Active OP Depression  LTG: Whenever I get into that deep depression, to be able to pull myself out in a short manner instead of letting it linger. Ways to forget thinking about the past.  (Progressing)                Start:  07/09/23    Expected End:  07/07/24    Goal Note Reviewed 02/23/24 - I hit that depression that week but I was able to get out of it. So out of a 10 of depression, I was at a 4 or 5.    STG: I'm gonna be honest with you, it's my kid. She's always mean and angry. To improve interpersonal effectiveness AEB increased positive interactions and decrease in negative emotional responses over the next 90 days.  (Progressing)  Goal Note Reviewed 02/23/24 I've been getting better.    STG: Maybe coping skills. To increase knowledge of coping skills AEB  psychoeducation and practice of skills over the next 90 days.  (Progressing)  Goal Note Reviewed 02/23/24 - I say pretty good.   ProgressTowards Goals: Progressing  Interventions: Motivational Interviewing, Conservator, Museum/gallery, and Supportive  Summary: Olivia King is a 38 y.o. female who presents with a history of anxiety, depression, and trauma. She appeared alert and oriented x5. She stated things are going well and they enjoyed Christmas, although [place] didn't receive any presents. She reported she told her family she didn't want anything. Karlynn provided updates with her daughter. She still hasn't started IIH yet. Marvalene has plans to call the office again to see what's taking so long. She was receptive to assertiveness tips and being an advocate for herself and her daughter.   Therapist Response: Conducted session with [employer]. Began session with check-in/update since previous session. Utilized empathetic and reflective listening. Used open-ended questions to facilitate discussion and summarized Anae's thoughts/feelings. Discussed being assertive to advocate for herself and her daughter. Reinforced the use of positive reinforcement as an effective behavior change strategy with her daughter. Scheduled additional appointment and concluded session.   Suicidal/Homicidal: No  Plan: Return again in 4 weeks.  Diagnosis: GAD (generalized anxiety disorder)  MDD (major depressive disorder), recurrent episode, mild  Trauma and stressor-related disorder  Collaboration of Care: Medication Management AEB chart review  Patient/Guardian was advised Release of Information must be obtained prior to any record release in order to collaborate their care with an outside provider. Patient/Guardian was advised  if they have not already done so to contact the registration department to sign all necessary forms in order for us  to release information regarding their care.   Consent:  Patient/Guardian gives verbal consent for treatment and assignment of benefits for services provided during this visit. Patient/Guardian expressed understanding and agreed to proceed.   Olivia King, [facility] 04/14/2024  "

## 2024-04-17 ENCOUNTER — Other Ambulatory Visit: Payer: Self-pay

## 2024-04-17 ENCOUNTER — Encounter: Payer: Self-pay | Admitting: Psychiatry

## 2024-04-17 ENCOUNTER — Ambulatory Visit (INDEPENDENT_AMBULATORY_CARE_PROVIDER_SITE_OTHER): Admitting: Psychiatry

## 2024-04-17 VITALS — BP 114/84 | HR 103 | Temp 96.9°F | Ht 63.0 in | Wt 263.0 lb

## 2024-04-17 DIAGNOSIS — F411 Generalized anxiety disorder: Secondary | ICD-10-CM | POA: Diagnosis not present

## 2024-04-17 DIAGNOSIS — F331 Major depressive disorder, recurrent, moderate: Secondary | ICD-10-CM | POA: Diagnosis not present

## 2024-04-17 DIAGNOSIS — G4701 Insomnia due to medical condition: Secondary | ICD-10-CM

## 2024-04-17 MED ORDER — VENLAFAXINE HCL ER 75 MG PO CP24: 75.0000 mg | ORAL_CAPSULE | Freq: Every day | ORAL | 1 refills | Status: AC

## 2024-04-17 MED ORDER — HYDROXYZINE HCL 10 MG PO TABS: 20.0000 mg | ORAL_TABLET | Freq: Every evening | ORAL | 1 refills | Status: AC | PRN

## 2024-04-17 MED ORDER — LAMOTRIGINE 100 MG PO TABS: 50.0000 mg | ORAL_TABLET | Freq: Every day | ORAL | 1 refills | Status: AC

## 2024-04-17 MED ORDER — VENLAFAXINE HCL ER 37.5 MG PO CP24: 37.5000 mg | ORAL_CAPSULE | Freq: Every day | ORAL | 1 refills | Status: AC
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
## 2024-04-18 MED ORDER — TRAZODONE HCL 50 MG PO TABS: 25.0000 mg | ORAL_TABLET | Freq: Every day | ORAL | 1 refills | Status: AC

## 2024-05-03 ENCOUNTER — Encounter: Payer: Self-pay | Admitting: Obstetrics

## 2024-05-03 ENCOUNTER — Ambulatory Visit (INDEPENDENT_AMBULATORY_CARE_PROVIDER_SITE_OTHER): Admitting: Obstetrics

## 2024-05-03 VITALS — BP 127/83 | HR 90 | Ht 63.0 in | Wt 265.0 lb

## 2024-05-03 DIAGNOSIS — Z3202 Encounter for pregnancy test, result negative: Secondary | ICD-10-CM

## 2024-05-03 DIAGNOSIS — N920 Excessive and frequent menstruation with regular cycle: Secondary | ICD-10-CM

## 2024-05-03 DIAGNOSIS — Z538 Procedure and treatment not carried out for other reasons: Secondary | ICD-10-CM | POA: Diagnosis not present

## 2024-05-03 LAB — POCT URINE PREGNANCY: Preg Test, Ur: NEGATIVE

## 2024-05-03 MED ORDER — LEVONORGEST-ETH ESTRAD 91-DAY 0.15-0.03 &0.01 MG PO TABS: 1.0000 | ORAL_TABLET | Freq: Every day | ORAL | 3 refills | Status: AC

## 2024-05-03 MED ORDER — NORETHINDRONE 0.35 MG PO TABS: 1.0000 | ORAL_TABLET | Freq: Every day | ORAL | 3 refills | Status: DC

## 2024-05-03 NOTE — Progress Notes (Signed)
" ° °  ENCOUNTER FOR IUD INSERTION   Subjective  Olivia King is a 39 y.o. [id number] who presents today for IUD insertion. She had a Mirena for 4 years and then it expelled spontaneously. She desires reversible long-term contraception. We have thoroughly reviewed the risks, benefits, and alternatives, and she has elected to proceed with Mirena insertion. She reports that she was told years ago she had small fibroids but has not had an issue with IUDs in the past.  Objective BP 127/83   Pulse 90   Ht 5' 3 (1.6 m)   Wt 265 lb (120.2 kg)   LMP 05/02/2024   BMI 46.94 kg/m   UPT: negative  Pelvic exam: normal external genitalia, vulva, vagina, cervix, uterus and adnexa.   Procedure Note Consent was obtained prior to the procedure. A bimanual exam was performed to determine the position of the uterus. A sterile speculum was placed in the vagina, and the cervix was visualized. Betadine was applied to the cervix followed by 4% lidocaine . A single-toothed was placed on the anterior lip of the cervix, and gentle traction was applied to straighten and stabilize it. Was unable to sound the uterus past 5 cm.  A dilator was placed and then the uterus was sounded to about 6 cm. The IUD was inserted to the appropriate depth and the insertion tool was removed. However, the IUD was visible at the os after insertion. The IUD was removed. Bleeding was minimal. Post-procedure care and warning signs were reviewed with Olivia King.   Assessment/Plan Recommended an US  to r/o anatomical causes of prior IUD expulsion and difficulty with placing this IUD. If no concerns, will consider dosing with misoprostol before attempting insertion. In the meantime, Olivia King would like to try Seasonique for contraception. Reviewed proper administration.    Follow up for annual visit or PRN.  Olivia King, CNM "

## 2024-05-15 ENCOUNTER — Ambulatory Visit: Admitting: Professional Counselor

## 2024-05-15 DIAGNOSIS — F331 Major depressive disorder, recurrent, moderate: Secondary | ICD-10-CM

## 2024-05-15 DIAGNOSIS — F411 Generalized anxiety disorder: Secondary | ICD-10-CM

## 2024-05-15 DIAGNOSIS — F439 Reaction to severe stress, unspecified: Secondary | ICD-10-CM

## 2024-05-15 NOTE — Progress Notes (Signed)
 " THERAPIST PROGRESS NOTE  Virtual Visit via Video Note  I connected with Olivia King on 05/15/24 at  3:00 PM EST by a video enabled telemedicine application and verified that I am speaking with the correct person using two identifiers.  Location: Patient: Home Provider: Remote office   I discussed the limitations of evaluation and management by telemedicine and the availability of in person appointments. The patient expressed understanding and agreed to proceed.   I discussed the assessment and treatment plan with the patient. The patient was provided an opportunity to ask questions and all were answered. The patient agreed with the plan and demonstrated an understanding of the instructions.   The patient was advised to call back or seek an in-person evaluation if the symptoms worsen or if the condition fails to improve as anticipated.  I provided 40 minutes of non-face-to-face time during this encounter. [name], [facility]  Session Time: 3:22 PM - 4:02 PM   Participation Level: Active  Behavioral Response: Well Groomed, Alert, Anxious  Type of Therapy: Individual Therapy  Treatment Goals addressed: Active OP Depression  LTG: Whenever I get into that deep depression, to be able to pull myself out in a short manner instead of letting it linger. Ways to forget thinking about the past.  (Progressing)                Start:  07/09/23    Expected End:  07/07/24    Goal Note Reviewed 02/23/24 - I hit that depression that week but I was able to get out of it. So out of a 10 of depression, I was at a 4 or 5.    STG: I'm gonna be honest with you, it's my kid. She's always mean and angry. To improve interpersonal effectiveness AEB increased positive interactions and decrease in negative emotional responses over the next 90 days.  (Progressing)  Goal Note Reviewed 02/23/24 I've been getting better.    STG: Maybe coping skills. To increase knowledge of coping skills AEB  psychoeducation and practice of skills over the next 90 days.  (Progressing)  Goal Note Reviewed 02/23/24 - I say pretty good.   ProgressTowards Goals: Progressing  Interventions: Motivational Interviewing, Conservator, Museum/gallery, and Supportive  Summary: Olivia King is a 39 y.o. female who presents with a history of anxiety, depression, and trauma. She appeared alert and oriented x5. She stated things are going okay. She was offered a permanent position with her job. She discussed how she advocated for herself to get a better wage. She is still waiting to find out. Helyne reported her daughter is waiting for outpatient therapy now since they were unable to get her into IIH. She noted they still have ups and downs.   Therapist Response: Conducted session with [employer]. Began session with check-in/update since previous session. Utilized empathetic and reflective listening. Used open-ended questions to facilitate discussion and summarized Demitra's thoughts/feelings. Reminded Jayelle of DEAR MAN skill to help with objective effectiveness. Scheduled additional appointment and concluded session.   Suicidal/Homicidal: No  Plan: Return again in 4 weeks.  Diagnosis: GAD (generalized anxiety disorder)  MDD (major depressive disorder), recurrent episode, moderate (HCC)  Trauma and stressor-related disorder  Collaboration of Care: Medication Management AEB chart review  Patient/Guardian was advised Release of Information must be obtained prior to any record release in order to collaborate their care with an outside provider. Patient/Guardian was advised if they have not already done so to contact the registration department to sign all necessary  forms in order for us  to release information regarding their care.   Consent: Patient/Guardian gives verbal consent for treatment and assignment of benefits for services provided during this visit. Patient/Guardian expressed understanding and agreed to  proceed.   [name], [facility] 05/15/2024  "

## 2024-05-17 ENCOUNTER — Telehealth: Payer: Self-pay | Admitting: Psychiatry

## 2024-05-17 DIAGNOSIS — F411 Generalized anxiety disorder: Secondary | ICD-10-CM

## 2024-05-17 DIAGNOSIS — G4701 Insomnia due to medical condition: Secondary | ICD-10-CM

## 2024-05-17 DIAGNOSIS — F331 Major depressive disorder, recurrent, moderate: Secondary | ICD-10-CM

## 2024-05-17 NOTE — Progress Notes (Signed)
 No response to call or text or video invite

## 2024-05-22 ENCOUNTER — Other Ambulatory Visit: Payer: Self-pay | Admitting: Family Medicine

## 2024-05-22 DIAGNOSIS — B9689 Other specified bacterial agents as the cause of diseases classified elsewhere: Secondary | ICD-10-CM

## 2024-05-24 ENCOUNTER — Telehealth: Admitting: Psychiatry

## 2024-05-24 ENCOUNTER — Encounter: Payer: Self-pay | Admitting: Psychiatry

## 2024-05-24 DIAGNOSIS — G4701 Insomnia due to medical condition: Secondary | ICD-10-CM

## 2024-05-24 DIAGNOSIS — F411 Generalized anxiety disorder: Secondary | ICD-10-CM

## 2024-05-24 DIAGNOSIS — F331 Major depressive disorder, recurrent, moderate: Secondary | ICD-10-CM

## 2024-05-24 NOTE — Progress Notes (Unsigned)
 Virtual Visit via Video Note  I connected with Olivia King on 05/24/24 at  2:00 PM EST by a video enabled telemedicine application and verified that I am speaking with the correct person using two identifiers.  Location Provider Location : [facility] Patient Location : Work  Participants: Patient , Provider    I discussed the limitations of evaluation and management by telemedicine and the availability of in person appointments. The patient expressed understanding and agreed to proceed.   I discussed the assessment and treatment plan with the patient. The patient was provided an opportunity to ask questions and all were answered. The patient agreed with the plan and demonstrated an understanding of the instructions.   The patient was advised to call back or seek an in-person evaluation if the symptoms worsen or if the condition fails to improve as anticipated.   BH MD OP Progress Note  05/24/2024 2:11 PM Olivia King  MRN:  [id number]  Chief Complaint:  Chief Complaint  Patient presents with   Medication Refill   Follow-up   Depression   Anxiety   Discussed the use of AI scribe software for clinical note transcription with the patient, who gave verbal consent to proceed.  History of Present Illness Olivia King is a 39 year old Hispanic female, employed, lives in [place], widowed, has a history of MDD, GAD, tobacco use disorder, insomnia, intracranial hypertension status post transverse stent was evaluated by telemedicine today for a follow-up appointment.  She reports feeling overwhelmed, especially in relation to challenges at home involving her daughter. She has experienced ongoing stress related to her daughter's mood changes and behavior. These issues lead her to feel reluctant to be around her daughter, and she often sits in her driveway before entering her home to find peace of mind, expressing uncertainty about what she will encounter inside. Over the past 4 to 5  years, she has actively sought help for her daughter.  Her work is going well and does not contribute to her current feelings of overwhelm. She reports that therapy sessions with her therapist focus primarily on her daughter, though she feels she has other concerns as well. She reports that she feels she may be about done with going to therapy.  She denies any suicidality, homicidality or perceptual disturbances.  She confirms adherence to her current medication regimen, which includes Lamictal  (half tablet of 100 mg daily), venlafaxine , and hydroxyzine  at night. She reports that by increasing her hydroxyzine  at night, she has improved her sleep and now sleeps well and stays asleep. She describes her appetite as variable, with some days lacking appetite, but she continues to eat and has reduced her intake of junk food. She denies any thoughts of hurting herself or others.     Visit Diagnosis:    ICD-10-CM   1. MDD (major depressive disorder), recurrent episode, moderate (HCC)  F33.1     2. GAD (generalized anxiety disorder)  F41.1     3. Insomnia due to medical condition  G47.01    depression and anxiety      Past Psychiatric History: I have reviewed past psychiatric history from progress note from 10/10/2019.  Past trials of medications like Lexapro , Vyvanse , Abilify , Paxil , Zoloft, Cymbalta .  Past Medical History:  Past Medical History:  Diagnosis Date   Anxiety 11/29/2023   Cigarette smoker    COVID-19 virus detected 03/21/2019   Dyslipidemia    Dysrhythmia    hx tachycardia-on metoprolol    Edema of both lower extremities 07/2023   Family  history of pancreatic cancer 08/2020   MyRisk neg except SDHA VUS; IBIS=9.2%/riskscore=7.3%   Hidradenitis suppurativa 10/30/2020   Idiopathic intracranial hypertension 03/17/2023   Insomnia 11/29/2023   IUD contraception 02/06/2016   Placed January 23, 2016 by [name], PA [facility]   Major depressive disorder, recurrent  08/19/2015   zoloft made her cry; cymbalta  made her too sleepy   Migraine headache    Pneumonia    x 1   Seasonal allergies 07/21/2023    Past Surgical History:  Procedure Laterality Date   CESAREAN SECTION  2002-06-13 &2012   contraceptive implant     loletta IUD   IR TRANSCATH PLC STENT  INITIAL VEIN  INC ANGIOPLASTY  12/13/2023   IR US  GUIDE VASC ACCESS RIGHT  12/13/2023   IR VENO/JUGULAR LEFT  12/21/2023   IR VENO/JUGULAR RIGHT  12/21/2023   RADIOLOGY WITH ANESTHESIA N/A 12/13/2023   Procedure: RADIOLOGY WITH ANESTHESIA;  Surgeon: Lester Golas, MD;  Location: [facility] OR;  Service: Radiology;  Laterality: N/A;  Right transverse sigmoid sinus stenting   WISDOM TOOTH EXTRACTION      Family Psychiatric History: I have reviewed family psychiatric history from progress note on 10/10/2019.  Family History:  Family History  Problem Relation Age of Onset   Hypertension Mother    Pancreatic cancer Mother 68   Hypertension Brother    Aneurysm Maternal Grandmother    Heart attack Maternal Grandfather    Hypertension Paternal Grandmother    Heart failure Sister     Social History: I have reviewed social history from progress note on 10/10/2019. Social History   Socioeconomic History   Marital status: Widowed    Spouse name: Not on file   Number of children: 2   Years of education: Not on file   Highest education level: Associate degree: occupational, scientist, product/process development, or vocational program  Occupational History   Not on file  Tobacco Use   Smoking status: Former    Current packs/day: 0.00    Types: Cigarettes    Start date: 03/05/2011    Quit date: 06/06/2022    Years since quitting: 1.9   Smokeless tobacco: Never  Vaping Use   Vaping status: Every Day   Substances: Nicotine, Flavoring  Substance and Sexual Activity   Alcohol use: Not Currently   Drug use: No   Sexual activity: Not Currently    Partners: Male    Comment: Patient has liletta  IUD  Other Topics Concern   Not on file  Social  History Narrative   She is a widow, he died 06-13-2009 while he was deployed.    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   Social Drivers of Health   Tobacco Use: Medium Risk (05/24/2024)   Patient History    Smoking Tobacco Use: Former    Smokeless Tobacco Use: Never    Passive Exposure: Not on file  Financial Resource Strain: Low Risk (06/09/2023)   Overall Financial Resource Strain (CARDIA)    Difficulty of Paying Living Expenses: Not hard at all  Food Insecurity: No Food Insecurity (06/09/2023)   Hunger Vital Sign    Worried About Running Out of Food in the Last Year: Never true    Ran Out of Food in the Last Year: Never true  Transportation Needs: No Transportation Needs (06/09/2023)   PRAPARE - Administrator, Civil Service (Medical): No    Lack of Transportation (Non-Medical): No  Physical Activity: Inactive (06/09/2023)   Exercise Vital Sign  Days of Exercise per Week: 0 days    Minutes of Exercise per Session: 0 min  Stress: Stress Concern Present (06/09/2023)   [employer] of Occupational Health - Occupational Stress Questionnaire    Feeling of Stress : To some extent  Social Connections: Moderately Isolated (06/09/2023)   Social Connection and Isolation Panel    Frequency of Communication with Friends and Family: More than three times a week    Frequency of Social Gatherings with Friends and Family: Once a week    Attends Religious Services: 1 to 4 times per year    Active Member of [employer] or Organizations: No    Attends Banker Meetings: Never    Marital Status: Widowed  Depression (PHQ2-9): Medium Risk (04/17/2024)   Depression (PHQ2-9)    PHQ-2 Score: 10  Alcohol Screen: Low Risk (12/24/2022)   Alcohol Screen    Last Alcohol Screening Score (AUDIT): 1  Housing: Low Risk (06/09/2023)   Housing Stability Vital Sign    Unable to Pay for Housing in the Last Year: No    Number of Times Moved in the Last Year: 0    Homeless in the Last Year: No   Utilities: Not At Risk (06/09/2023)   AHC Utilities    Threatened with loss of utilities: No  Health Literacy: Adequate Health Literacy (06/09/2023)   B1300 Health Literacy    Frequency of need for help with medical instructions: Never    Allergies: Allergies[1]  Metabolic Disorder Labs: Lab Results  Component Value Date   HGBA1C 5.6 12/29/2023   MPG 114 12/29/2023   MPG 108 12/24/2022   No results found for: PROLACTIN Lab Results  Component Value Date   CHOL 216 (H) 12/29/2023   TRIG 149 12/29/2023   HDL 51 12/29/2023   CHOLHDL 4.2 12/29/2023   VLDL 11 03/19/2016   LDLCALC 137 (H) 12/29/2023   LDLCALC 114 (H) 12/24/2022   Lab Results  Component Value Date   TSH 2.37 12/29/2023   TSH 2.20 12/24/2022    Therapeutic Level Labs: No results found for: LITHIUM No results found for: VALPROATE No results found for: CBMZ  Current Medications: Current Outpatient Medications  Medication Sig Dispense Refill   aspirin  EC 81 MG tablet Take 1 tablet (81 mg total) by mouth daily. Swallow whole. 300 tablet 0   aspirin -acetaminophen -caffeine  (EXCEDRIN MIGRAINE) 250-250-65 MG tablet Take 2 tablets by mouth every 6 (six) hours as needed for headache or migraine.     clindamycin  (CLEOCIN  T) 1 % lotion Apply 1 Application topically daily. 120 mL 5   fluticasone  (FLONASE ) 50 MCG/ACT nasal spray Place 2 sprays into both nostrils daily. 15.8 mL 1   folic acid  (FOLVITE ) 1 MG tablet Take 1 mg by mouth daily.     hydrOXYzine  (ATARAX ) 10 MG tablet Take 2 tablets (20 mg total) by mouth at bedtime as needed for anxiety. 180 tablet 1   lamoTRIgine  (LAMICTAL ) 100 MG tablet Take 0.5 tablets (50 mg total) by mouth daily. 30 tablet 1   Levonorgestrel -Ethinyl Estradiol (AMETHIA) 0.15-0.03 &0.01 MG tablet Take 1 tablet by mouth at bedtime. 84 tablet 3   loratadine  (CLARITIN ) 10 MG tablet Take 1 tablet (10 mg total) by mouth daily. 90 tablet 1   metoprolol  succinate (TOPROL -XL) 25 MG 24 hr  tablet Take 1 tablet (25 mg total) by mouth daily. 90 tablet 1   metroNIDAZOLE  (METROGEL ) 0.75 % vaginal gel Place vaginally 2 (two) times daily. (Patient not taking: Reported on 05/03/2024) 70  g 2   montelukast  (SINGULAIR ) 10 MG tablet Take 1 tablet (10 mg total) by mouth at bedtime. 90 tablet 1   Multiple Vitamin (MULTI-VITAMINS) TABS Take by mouth.     topiramate  (TOPAMAX ) 50 MG tablet Take 50 mg by mouth daily.     torsemide  (DEMADEX ) 20 MG tablet Take 1 tablet (20 mg total) by mouth every three (3) days as needed. 90 tablet 0   traZODone  (DESYREL ) 50 MG tablet Take 0.5-1 tablets (25-50 mg total) by mouth at bedtime. For sleep 30 tablet 1   venlafaxine  XR (EFFEXOR  XR) 75 MG 24 hr capsule Take 1 capsule (75 mg total) by mouth daily with breakfast. Take along with 37.5 mg daily 90 capsule 1   venlafaxine  XR (EFFEXOR -XR) 37.5 MG 24 hr capsule Take 1 capsule (37.5 mg total) by mouth daily with breakfast. Take along with 75 mg daily 90 capsule 1   vitamin B-12 (CYANOCOBALAMIN ) 500 MCG tablet Take 500 mcg by mouth daily.     No current facility-administered medications for this visit.     Musculoskeletal: Strength & Muscle Tone: UTA Gait & Station: Seated Patient leans: N/A  Psychiatric Specialty Exam: Review of Systems  Psychiatric/Behavioral:  Positive for dysphoric mood and sleep disturbance. The patient is nervous/anxious.     Last menstrual period 05/02/2024.There is no height or weight on file to calculate BMI.  General Appearance: Casual  Eye Contact:  Fair  Speech:  Clear and Coherent  Volume:  Normal  Mood:  Anxious and Depressed Improving  Affect:  Congruent  Thought Process:  Goal Directed and Descriptions of Associations: Intact  Orientation:  Full (Time, Place, and Person)  Thought Content: Logical   Suicidal Thoughts:  No  Homicidal Thoughts:  No  Memory:  Immediate;   Fair Recent;   Fair Remote;   Fair  Judgement:  Fair  Insight:  Fair  Psychomotor Activity:   Normal  Concentration:  Concentration: Fair and Attention Span: Fair  Recall:  [employer] of Knowledge: Fair  Language: Fair  Akathisia:  No  Handed:  Right  AIMS (if indicated): not done  Assets:  Communication Skills Desire for Improvement Housing Social Support  ADL's:  Intact  Cognition: WNL  Sleep:  Fair   Screenings: AIMS    Flowsheet Row Video Visit from 10/23/2021 in [facility]  AIMS Total Score 0   GAD-7    Flowsheet Row Office Visit from 04/17/2024 in [facility] Office Visit from 07/21/2023 in [facility] Counselor from 06/09/2023 in [facility] Office Visit from 05/05/2023 in [facility] Office Visit from 03/17/2023 in [facility]  Total GAD-7 Score 13 0 12 5 7    PHQ2-9    Flowsheet Row Office Visit from 04/17/2024 in [facility] Office Visit from 12/29/2023 in [facility] Office Visit from 07/21/2023 in [facility] Counselor from 06/09/2023 in [facility] Office Visit from 05/05/2023 in [facility]  PHQ-2 Total Score 4 0 0 4 0  PHQ-9 Total Score 10 0 0 11 --   Flowsheet Row Video Visit from 05/24/2024 in [facility] Office Visit from 04/17/2024 in [facility] Video Visit from 12/16/2023 in [facility]  C-SSRS RISK CATEGORY No  Risk No Risk No Risk     Assessment and Plan: Olivia King is a 38 year old female who presented for a follow-up appointment, discussed assessment and plan as noted below.  1. MDD (major depressive disorder), recurrent episode, moderate  (HCC)-improving Depression symptoms with improvement with the recent medication changes.  Although with ongoing mood symptoms mostly due to situational struggles she is not interested in further medication adjustment. Continue Venlafaxine  extended release 112.5 mg daily (did not tolerate higher dosages.) Continue Lamictal  50 mg twice a day  2. GAD (generalized anxiety disorder)-improving Ongoing anxiety due to situational challenges.  Therapy has been beneficial although she has been going to therapy once a month at this time.  Not currently interested in increasing the frequency of therapy. Continue CBT with Ms. Almarie Ligas Continue Venlafaxine  as prescribed Continue Hydroxyzine  20 mg daily as needed I have coordinated care with Ms. Ligas, therapist.  3. Insomnia due to medical condition-improving Currently reports sleep is improving Continue Trazodone  25 to 50 mg at bedtime as needed Continue Hydroxyzine  20 mg daily as needed Continue sleep hygiene techniques  Follow-up Follow-up in clinic in 1 month or sooner if needed.    Collaboration of Care: Collaboration of Care: Referral or follow-up with counselor/therapist AEB I have coordinated care with Ms. Ligas.  Patient/Guardian was advised Release of Information must be obtained prior to any record release in order to collaborate their care with an outside provider. Patient/Guardian was advised if they have not already done so to contact the registration department to sign all necessary forms in order for us  to release information regarding their care.   Consent: Patient/Guardian gives verbal consent for treatment and assignment of benefits for services provided during this visit. Patient/Guardian expressed understanding and agreed to proceed.   This note was generated in part or whole with voice recognition software. Voice recognition is usually quite accurate but there are transcription errors that can and very often do occur. I  apologize for any typographical errors that were not detected and corrected.    Ember Henrikson, MD 05/25/2024, 9:59 AM     [1]  Allergies Allergen Reactions   Sumatriptan  Anaphylaxis    Throat and tongue swelling    Penicillins Rash and Dermatitis   Vortioxetine  Other (See Comments)    Tingling hands and blurred vision  Trintellix    Doxycycline      Unable to take it currently due to intracranial hypertension

## 2024-05-24 NOTE — Telephone Encounter (Signed)
 Requested medication (s) are due for refill today: yes  Requested medication (s) are on the active medication list: yes  Last refill:  07/21/23  Future visit scheduled: yes  Notes to clinic:  Medication not assigned to a protocol, review manually.      Requested Prescriptions  Pending Prescriptions Disp Refills   metroNIDAZOLE  (METROGEL ) 0.75 % vaginal gel [Pharmacy Med Name: METRONIDAZOLE  0.75% VAGINAL GEL 70G] 70 g 2    Sig: PLACE VAGINALLY TWICE DAILY     Off-Protocol Failed - 05/24/2024  8:39 AM      Failed - Medication not assigned to a protocol, review manually.      Passed - Valid encounter within last 12 months    Recent Outpatient Visits           4 months ago B12 deficiency   [facility] Glenard Mire, MD   10 months ago Bilateral edema of lower extremity   [facility] Glenard Mire, MD       Future Appointments             In 2 weeks Agbor-Etang, Redell, MD [facility]

## 2024-05-26 ENCOUNTER — Ambulatory Visit

## 2024-05-26 DIAGNOSIS — N920 Excessive and frequent menstruation with regular cycle: Secondary | ICD-10-CM

## 2024-06-07 ENCOUNTER — Ambulatory Visit: Admitting: Cardiology

## 2024-06-12 ENCOUNTER — Ambulatory Visit: Admitting: Professional Counselor

## 2024-06-23 ENCOUNTER — Telehealth: Admitting: Psychiatry

## 2024-06-28 ENCOUNTER — Ambulatory Visit: Admitting: Family Medicine
